# Patient Record
Sex: Female | Born: 1985 | Race: White | Hispanic: No | Marital: Married | Smoking: Former smoker
Health system: Southern US, Community
[De-identification: ages and names within clinical notes are randomized; demographics above are authoritative.]

## PROBLEM LIST (undated history)

## (undated) ENCOUNTER — Inpatient Hospital Stay (HOSPITAL_COMMUNITY): Payer: Self-pay

## (undated) DIAGNOSIS — R519 Headache, unspecified: Secondary | ICD-10-CM

## (undated) DIAGNOSIS — N9489 Other specified conditions associated with female genital organs and menstrual cycle: Secondary | ICD-10-CM

## (undated) DIAGNOSIS — R51 Headache: Secondary | ICD-10-CM

## (undated) DIAGNOSIS — C801 Malignant (primary) neoplasm, unspecified: Secondary | ICD-10-CM

## (undated) DIAGNOSIS — Z860100 Personal history of colon polyps, unspecified: Secondary | ICD-10-CM

## (undated) DIAGNOSIS — F419 Anxiety disorder, unspecified: Secondary | ICD-10-CM

## (undated) DIAGNOSIS — K219 Gastro-esophageal reflux disease without esophagitis: Secondary | ICD-10-CM

## (undated) DIAGNOSIS — I5022 Chronic systolic (congestive) heart failure: Secondary | ICD-10-CM

## (undated) DIAGNOSIS — R000 Tachycardia, unspecified: Secondary | ICD-10-CM

## (undated) DIAGNOSIS — I10 Essential (primary) hypertension: Secondary | ICD-10-CM

## (undated) DIAGNOSIS — R112 Nausea with vomiting, unspecified: Secondary | ICD-10-CM

## (undated) DIAGNOSIS — Z8742 Personal history of other diseases of the female genital tract: Secondary | ICD-10-CM

## (undated) DIAGNOSIS — J45909 Unspecified asthma, uncomplicated: Secondary | ICD-10-CM

## (undated) DIAGNOSIS — R102 Pelvic and perineal pain: Secondary | ICD-10-CM

## (undated) DIAGNOSIS — J40 Bronchitis, not specified as acute or chronic: Secondary | ICD-10-CM

## (undated) DIAGNOSIS — O903 Peripartum cardiomyopathy: Secondary | ICD-10-CM

## (undated) DIAGNOSIS — Z1401 Asymptomatic hemophilia A carrier: Secondary | ICD-10-CM

## (undated) DIAGNOSIS — Z9889 Other specified postprocedural states: Secondary | ICD-10-CM

## (undated) HISTORY — DX: Essential (primary) hypertension: I10

## (undated) HISTORY — DX: Peripartum cardiomyopathy: O90.3

## (undated) HISTORY — DX: Tachycardia, unspecified: R00.0

## (undated) HISTORY — DX: Bronchitis, not specified as acute or chronic: J40

## (undated) HISTORY — PX: ABDOMINAL HYSTERECTOMY: SHX81

## (undated) HISTORY — DX: Chronic systolic (congestive) heart failure: I50.22

## (undated) HISTORY — DX: Gastro-esophageal reflux disease without esophagitis: K21.9

---
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
## 2005-02-08 ENCOUNTER — Other Ambulatory Visit: Admission: RE | Admit: 2005-02-08 | Discharge: 2005-02-08 | Payer: Self-pay | Admitting: Obstetrics and Gynecology

## 2005-04-25 ENCOUNTER — Ambulatory Visit: Payer: Self-pay | Admitting: Gastroenterology

## 2005-05-10 ENCOUNTER — Ambulatory Visit: Payer: Self-pay | Admitting: Gastroenterology

## 2005-05-15 ENCOUNTER — Encounter (INDEPENDENT_AMBULATORY_CARE_PROVIDER_SITE_OTHER): Payer: Self-pay | Admitting: Specialist

## 2005-05-15 ENCOUNTER — Ambulatory Visit: Payer: Self-pay | Admitting: Gastroenterology

## 2005-06-04 ENCOUNTER — Ambulatory Visit: Payer: Self-pay | Admitting: Gastroenterology

## 2005-06-11 ENCOUNTER — Ambulatory Visit (HOSPITAL_COMMUNITY): Admission: RE | Admit: 2005-06-11 | Discharge: 2005-06-11 | Payer: Self-pay | Admitting: Gastroenterology

## 2005-07-02 ENCOUNTER — Ambulatory Visit: Payer: Self-pay | Admitting: Gastroenterology

## 2005-08-08 ENCOUNTER — Other Ambulatory Visit: Admission: RE | Admit: 2005-08-08 | Discharge: 2005-08-08 | Payer: Self-pay | Admitting: Obstetrics and Gynecology

## 2005-11-21 ENCOUNTER — Ambulatory Visit: Payer: Self-pay | Admitting: Gastroenterology

## 2005-11-28 ENCOUNTER — Ambulatory Visit (HOSPITAL_COMMUNITY): Admission: RE | Admit: 2005-11-28 | Discharge: 2005-11-28 | Payer: Self-pay | Admitting: Gastroenterology

## 2005-12-25 ENCOUNTER — Ambulatory Visit: Payer: Self-pay | Admitting: Gastroenterology

## 2005-12-26 ENCOUNTER — Ambulatory Visit: Payer: Self-pay | Admitting: Gastroenterology

## 2006-11-04 ENCOUNTER — Ambulatory Visit: Payer: Self-pay | Admitting: Gastroenterology

## 2006-11-14 ENCOUNTER — Encounter (INDEPENDENT_AMBULATORY_CARE_PROVIDER_SITE_OTHER): Payer: Self-pay | Admitting: Specialist

## 2006-11-14 ENCOUNTER — Ambulatory Visit: Payer: Self-pay | Admitting: Gastroenterology

## 2007-07-18 ENCOUNTER — Inpatient Hospital Stay (HOSPITAL_COMMUNITY): Admission: AD | Admit: 2007-07-18 | Discharge: 2007-07-18 | Payer: Self-pay | Admitting: Obstetrics and Gynecology

## 2007-12-30 ENCOUNTER — Inpatient Hospital Stay (HOSPITAL_COMMUNITY): Admission: AD | Admit: 2007-12-30 | Discharge: 2007-12-30 | Payer: Self-pay | Admitting: Obstetrics and Gynecology

## 2008-02-05 ENCOUNTER — Inpatient Hospital Stay (HOSPITAL_COMMUNITY): Admission: AD | Admit: 2008-02-05 | Discharge: 2008-02-05 | Payer: Self-pay | Admitting: Obstetrics and Gynecology

## 2008-02-20 ENCOUNTER — Inpatient Hospital Stay (HOSPITAL_COMMUNITY): Admission: AD | Admit: 2008-02-20 | Discharge: 2008-02-20 | Payer: Self-pay | Admitting: Obstetrics and Gynecology

## 2008-02-24 ENCOUNTER — Inpatient Hospital Stay (HOSPITAL_COMMUNITY): Admission: AD | Admit: 2008-02-24 | Discharge: 2008-02-24 | Payer: Self-pay | Admitting: Obstetrics and Gynecology

## 2008-02-29 ENCOUNTER — Inpatient Hospital Stay (HOSPITAL_COMMUNITY): Admission: AD | Admit: 2008-02-29 | Discharge: 2008-03-03 | Payer: Self-pay | Admitting: Obstetrics and Gynecology

## 2010-11-27 NOTE — Op Note (Signed)
Michelle Kane, Michelle Kane               ACCOUNT NO.:  [id number]   MEDICAL RECORD NO.:  [id number]          PATIENT TYPE:  INP   LOCATION:  9111                          FACILITY:  [facility]   PHYSICIAN:  Michelle L. Grewal, M.D.DATE OF BIRTH:  08/06/1985   DATE OF PROCEDURE:  03/01/2008  DATE OF DISCHARGE:                               OPERATIVE REPORT   PREOP DIAGNOSES:  Intrauterine pregnancy at term and failure to  progress.   POSTOP DIAGNOSES:  Intrauterine pregnancy at term and failure to  progress.   PROCEDURE:  Primary low-transverse cesarean section.   SURGEON:  Michelle L. Grewal, MD   ANESTHESIA:  Epidural.   FINDINGS:  Female infant in OP position, Apgars 8 at 1 minute and 9 at 5  minutes.   ESTIMATED BLOOD LOSS:  500 mL.   COMPLICATIONS:  None.   PROCEDURE:  The patient was taken to the operating room.  She was given  epidural without difficulty, she was prepped and draped, and low-  transverse incision was made and carried down to the fascia.  Fascia was  scored in the midline.  Rectus muscles separated and  the midline  entered bluntly.  The peritoneal suture was then stretched.  The bladder  blade was inserted.  The lower uterine segment was identified and the  bladder flap was created sharply and then digitally.  The bladder blade  was then readjusted.  A low-transverse incision was made with hemostat.  The baby was delivered with assistance of a vacuum extractor without  difficulty.  The baby was a female infant, Apgars 8 at 1 minute and 9 at  5 minutes.  The cord was clamped and cut.  The baby was handed to  waiting pediatricians.  Placenta was delivered manually.  After manual  removal, noted be intact.  The uterus was exteriorized and cleared of  all clots and debris.  The uterine incision was closed in 2 layers using  0 chromic in running locked stitch.  Hemostasis was excellent.  The  peritoneum was closed using 0 Vicryl in running locked stitch.  After  irrigation of the subcutaneous layer, the fascia was closed with 0  Vicryl in a running stitch.  The skin was closed with staples.  All  sponge, lap, and instrument counts were correct x2.  The patient went to  recovery room in stable condition.      Michelle L. Vincente Poli, M.D.  Electronically Signed     MLG/MEDQ  D:  03/01/2008  T:  03/02/2008  Job:  [id number]

## 2010-11-30 NOTE — Discharge Summary (Signed)
Michelle Kane, Michelle Kane               ACCOUNT NO.:  [id number]   MEDICAL RECORD NO.:  [id number]          PATIENT TYPE:  INP   LOCATION:  9111                          FACILITY:  [facility]   PHYSICIAN:  Guy Sandifer. Henderson Cloud, M.D. DATE OF BIRTH:  10-17-1985   DATE OF ADMISSION:  02/29/2008  DATE OF DISCHARGE:  03/03/2008                               DISCHARGE SUMMARY   ADMITTING DIAGNOSES:  1. Intrauterine pregnancy at term.  2. Induction of labor, secondary to pregnancy-induced hypertension.   DISCHARGE DIAGNOSES:  1. Status post low transverse cesarean section, secondary to failure      to progress.  2. A viable female infant.   PROCEDURE:  Primary low transverse cesarean section.   REASON FOR ADMISSION:  Please see written H&P.   HOSPITAL COURSE:  The patient is a 25 year old gravida 2, para 1-0-0-1  who presented to [name] for an induction of labor  secondary to [facility].  The patient had received Cytotec, and now Pitocin.  The patient's cervix was examined and found to be 2 cm dilated, 80%  effaced, and vertex at a -2 station.  Artificial rupture of membranes  was performed, which revealed scant amount of fluid.  The patient was  given an epidural for her comfort and Pitocin was started for  augmentation of her labor.  Later that evening, the patient had been in  good pattern of labor all day, the cervix was examined and found to be 4  cm, baby high with molding.  There had been no further change in her  cervix since approximately at 11:30 that morning and intrauterine  pressure catheter had revealed that the patient was having adequate  labor.  Decision was made to proceed with a primary low transverse  cesarean section for failure to progress and the patient was then  transferred to the operating room where epidural was dosed to an  adequate surgical level.  A low transverse incision was made with  delivery of a viable female infant with Apgars of 8 at one minute and  9  at five minutes.  The patient tolerated the procedure well and was taken  to the recovery room in stable condition.   On postoperative day #1, the patient was without complaint.  She denied  headache or blurred vision, or right upper quadrant pain.  Vital signs  were stable.  She was afebrile.  Blood pressure 90/51 to 114/71.  Abdomen soft.  Fundus firm and nontender.  Abdominal dressing was noted  to be clean, dry, and intact.  Foley was draining with adequate amount  of urine output.   Laboratory findings revealed hemoglobin of 8.1, platelet count of  174,000, and WBC count of 10.5.  Hemoglobin on admission was 10.6.  CBC  was ordered for the following morning.   On postoperative day #2, the patient desired early discharge.  Vital  signs were stable.  She was afebrile.  Heart rate was 90-94.  Abdomen  soft.  Fundus firm and nontender.  Incision was clean, dry, and intact.   Laboratory findings revealed hemoglobin of 7.1 and platelet  count of  174,000.   DISCHARGE INSTRUCTIONS:  Reviewed and the patient was later discharged  home.   CONDITION ON DISCHARGE:  Stable.   DIET:  Regular as tolerated.   ACTIVITY:  No heavy lifting, no driving x2 weeks, and no vaginal entry.   FOLLOWUP:  The patient to follow up in the office in 2-3 days for staple  removal.  She is to call for temperature greater than 100 degrees,  persistent nausea, vomiting, heavy vaginal bleeding and/or redness or  drainage from the incisional site.   DISCHARGE MEDICATIONS:  1. Tylox #30 one p.o. every 4-6 hours p.r.n.  2. Motrin 600 mg every 6 hours.  3. Prenatal vitamins 1 p.o. daily.  4. Iron supplement 325 mg one p.o. b.i.d.      Julio Sicks, N.P.      Guy Sandifer. Henderson Cloud, M.D.  Electronically Signed    CC/MEDQ  D:  03/15/2008  T:  03/16/2008  Job:  [id number]

## 2011-04-03 LAB — URINALYSIS, ROUTINE W REFLEX MICROSCOPIC
Glucose, UA: NEGATIVE
Ketones, ur: NEGATIVE
Leukocytes, UA: NEGATIVE
Nitrite: NEGATIVE
Protein, ur: NEGATIVE
Specific Gravity, Urine: >1.03 — ABNORMAL HIGH
Urobilinogen, UA: 0.2
pH: 5.5

## 2011-04-03 LAB — URINE MICROSCOPIC-ADD ON

## 2011-04-03 LAB — HCG, QUANTITATIVE, PREGNANCY: hCG, Beta Chain, Quant, S: 25807 — ABNORMAL HIGH

## 2011-04-03 LAB — RHOGAM INJECTION

## 2011-04-03 LAB — TYPE AND SCREEN
ABO/RH(D): A NEG
Antibody Screen: NEGATIVE

## 2011-04-03 LAB — POCT PREGNANCY, URINE
Operator id: 208801
Preg Test, Ur: POSITIVE

## 2011-04-11 LAB — FETAL FIBRONECTIN: Fetal Fibronectin: NEGATIVE

## 2011-06-25 ENCOUNTER — Ambulatory Visit: Payer: Self-pay

## 2011-07-27 ENCOUNTER — Emergency Department (INDEPENDENT_AMBULATORY_CARE_PROVIDER_SITE_OTHER): Payer: Self-pay

## 2011-07-27 ENCOUNTER — Other Ambulatory Visit: Payer: Self-pay

## 2011-07-27 ENCOUNTER — Encounter (HOSPITAL_BASED_OUTPATIENT_CLINIC_OR_DEPARTMENT_OTHER): Payer: Self-pay | Admitting: Emergency Medicine

## 2011-07-27 ENCOUNTER — Emergency Department (HOSPITAL_BASED_OUTPATIENT_CLINIC_OR_DEPARTMENT_OTHER)
Admission: EM | Admit: 2011-07-27 | Discharge: 2011-07-27 | Disposition: A | Discharge status: Home / Self Care | Payer: Self-pay | Attending: Emergency Medicine | Admitting: Emergency Medicine

## 2011-07-27 DIAGNOSIS — R079 Chest pain, unspecified: Secondary | ICD-10-CM | POA: Insufficient documentation

## 2011-07-27 DIAGNOSIS — A499 Bacterial infection, unspecified: Secondary | ICD-10-CM | POA: Insufficient documentation

## 2011-07-27 DIAGNOSIS — N760 Acute vaginitis: Secondary | ICD-10-CM | POA: Insufficient documentation

## 2011-07-27 DIAGNOSIS — M940 Chondrocostal junction syndrome [Tietze]: Secondary | ICD-10-CM | POA: Insufficient documentation

## 2011-07-27 DIAGNOSIS — N72 Inflammatory disease of cervix uteri: Secondary | ICD-10-CM | POA: Insufficient documentation

## 2011-07-27 DIAGNOSIS — B9689 Other specified bacterial agents as the cause of diseases classified elsewhere: Secondary | ICD-10-CM | POA: Insufficient documentation

## 2011-07-27 DIAGNOSIS — F17200 Nicotine dependence, unspecified, uncomplicated: Secondary | ICD-10-CM | POA: Insufficient documentation

## 2011-07-27 LAB — URINALYSIS, ROUTINE W REFLEX MICROSCOPIC
Glucose, UA: NEGATIVE mg/dL
Ketones, ur: NEGATIVE mg/dL
Leukocytes, UA: NEGATIVE
Nitrite: NEGATIVE
Protein, ur: NEGATIVE mg/dL
Specific Gravity, Urine: 1.019 (ref 1.005–1.030)

## 2011-07-27 LAB — WET PREP, GENITAL: Yeast Wet Prep HPF POC: NONE SEEN

## 2011-07-27 MED ORDER — IBUPROFEN 800 MG PO TABS
800.0000 mg | ORAL_TABLET | Freq: Once | ORAL | Status: AC
  Administered 2011-07-27: 800 mg via ORAL
  Filled 2011-07-27: qty 1

## 2011-07-27 MED ORDER — CEFTRIAXONE SODIUM 250 MG IJ SOLR
250.0000 mg | Freq: Once | INTRAMUSCULAR | Status: AC
  Administered 2011-07-27: 250 mg via INTRAMUSCULAR
  Filled 2011-07-27: qty 250

## 2011-07-27 MED ORDER — AZITHROMYCIN 250 MG PO TABS
1000.0000 mg | ORAL_TABLET | Freq: Once | ORAL | Status: AC
  Administered 2011-07-27: 1000 mg via ORAL
  Filled 2011-07-27: qty 4

## 2011-07-27 MED ORDER — METRONIDAZOLE 500 MG PO TABS: 500.0000 mg | ORAL_TABLET | Freq: Two times a day (BID) | ORAL | Status: AC

## 2011-07-27 MED ORDER — IBUPROFEN 600 MG PO TABS: 600.0000 mg | ORAL_TABLET | Freq: Four times a day (QID) | ORAL | Status: AC | PRN

## 2011-07-27 NOTE — ED Notes (Signed)
Pt reports intermittent mid CP w/ radiation to back & a "weird sensation" in RUE since 7 am; also c/o HA and RUQ & RLQ pain

## 2011-07-27 NOTE — ED Provider Notes (Signed)
History     CSN: [id number]  Arrival date & time 07/27/11  1102   First MD Initiated Contact with Patient 07/27/11 1155      Chief Complaint  Patient presents with  . Chest Pain    (Consider location/radiation/quality/duration/timing/severity/associated sxs/prior treatment) HPI Patient presents with a complaint of midsternal chest pain which has been intermittent this morning. It is worse with palpation. It is not associated with exertion. There is no nausea shortness of breath radiation or diaphoresis with the pain. She also has a complaint of lower abdominal pain which is cramping in nature and has been present for 2 days. She denies any dysuria or vaginal discharge. She does have a Mirena in place and has had an infection in the past with Mirena. She denies any fever or chills and has no nausea or vomiting. There no other alleviating or modifying factors. There no other associated systemic symptoms.  History reviewed. No pertinent past medical history.  Past Surgical History  Procedure Date  . Cesarean section     No family history on file.  History  Substance Use Topics  . Smoking status: Current Everyday Smoker  . Smokeless tobacco: Not on file  . Alcohol Use: Yes     social    OB History    Grav Para Term Preterm Abortions TAB SAB Ect Mult Living                  Review of Systems ROS reviewed and otherwise negative except for mentioned in HPI  Allergies  Review of patient's allergies indicates no known allergies.  Home Medications   Current Outpatient Rx  Name Route Sig Dispense Refill  . IBUPROFEN 600 MG PO TABS Oral Take 1 tablet (600 mg total) by mouth every 6 (six) hours as needed for pain. 30 tablet 0  . METRONIDAZOLE 500 MG PO TABS Oral Take 1 tablet (500 mg total) by mouth 2 (two) times daily. 14 tablet 0    BP 123/82  Pulse 92  Temp(Src) 97.8 F (36.6 C) (Oral)  Resp 16  Ht 5\' 7"  (1.702 m)  Wt 235 lb (106.595 kg)  BMI 36.81 kg/m2  SpO2  100%  LMP 07/06/2011 Vitals reviewed Physical Exam Physical Examination: General appearance - alert, well appearing, and in no distress Mental status - alert, oriented to person, place, and time Mouth - mucous membranes moist, pharynx normal without lesions Chest - clear to auscultation, no wheezes, rales or rhonchi, symmetric air entry, tenderness to palpation over chest wall at costochondral junction on right Heart - normal rate, regular rhythm, normal S1, S2, no murmurs, rubs, clicks or gallops Abdomen - soft, mild lower abdominal tenderness, no gaurding or rebound, nondistended, no masses or organomegaly Pelvic - normal external genitalia, vulva, vagina, cervix, uterus and adnexa Musculoskeletal - no joint tenderness, deformity or swelling Extremities - peripheral pulses normal, no pedal edema, no clubbing or cyanosis Skin - normal coloration and turgor, no rashes, no suspicious skin lesions noted  ED Course  Procedures (including critical care time)  12:19 PM have been to patient's room x 2 to evaluate her and she has not been in room   Date: 07/27/2011  Rate: 81  Rhythm: normal sinus rhythm  QRS Axis: normal  Intervals: normal  ST/T Wave abnormalities: normal  Conduction Disutrbances:none  Narrative Interpretation:   Old EKG Reviewed: none available    Labs Reviewed  WET PREP, GENITAL - Abnormal; Notable for the following:    Clue Cells, Wet  Prep TOO NUMEROUS TO COUNT (*)    WBC, Wet Prep HPF POC TOO NUMEROUS TO COUNT (*)    All other components within normal limits  URINALYSIS, ROUTINE W REFLEX MICROSCOPIC  PREGNANCY, URINE  POCT PREGNANCY, URINE  GC/CHLAMYDIA PROBE AMP, GENITAL   Dg Chest 2 View  07/27/2011  *RADIOLOGY REPORT*  Clinical Data: Chest pain for 1 day.  No prior surgery.  CHEST - 2 VIEW  Comparison: None.  Findings: Cardiomediastinal silhouette is within normal limits. The lungs are free of focal consolidations and pleural effusions. No edema. Visualized  osseous structures have a normal appearance.  IMPRESSION: Negative exam.  Original Report Authenticated By: Patterson Hammersmith, M.D.     1. Costochondritis   2. Bacterial vaginosis   3. Cervicitis       MDM  Patient presenting with sharp chest pains which are reproducible and most consistent with costochondritis. She has a normal EKG and normal chest x-ray as well. She has no risk factors for ACS and is PERC 0 making PE very unlikely. She also has lower abdominal pain and on pelvic exam had no tenderness of her fundus or cervical motion tenderness or adnexa. She did have clue cells and white blood cells on her wet prep so will be treated for bacterial vaginosis and cervicitis. Based on her pelvic exam I will not treat her for pelvic inflammatory disease. She was given ibuprofen for her costochondritis. She was given strict return precautions, and is agreeable with the plan for discharge.        Ethelda Chick, MD 07/27/11 1430

## 2011-07-30 LAB — GC/CHLAMYDIA PROBE AMP, GENITAL: GC Probe Amp, Genital: NEGATIVE

## 2011-07-31 NOTE — ED Notes (Signed)
+   Chlamydia. Treated with rocephin and zithromax. Per protocol MD. [employer] form faxed.

## 2011-08-01 ENCOUNTER — Telehealth (HOSPITAL_COMMUNITY): Payer: Self-pay | Admitting: *Deleted

## 2011-08-02 ENCOUNTER — Telehealth (HOSPITAL_COMMUNITY): Payer: Self-pay | Admitting: *Deleted

## 2011-08-03 ENCOUNTER — Telehealth (HOSPITAL_COMMUNITY): Payer: Self-pay | Admitting: Emergency Medicine

## 2011-08-05 NOTE — ED Notes (Signed)
Letter sent to Epic address 1/21 °

## 2011-10-29 ENCOUNTER — Emergency Department (INDEPENDENT_AMBULATORY_CARE_PROVIDER_SITE_OTHER): Payer: Managed Care, Other (non HMO)

## 2011-10-29 ENCOUNTER — Emergency Department (HOSPITAL_BASED_OUTPATIENT_CLINIC_OR_DEPARTMENT_OTHER)
Admission: EM | Admit: 2011-10-29 | Discharge: 2011-10-29 | Disposition: A | Discharge status: Home / Self Care | Payer: Managed Care, Other (non HMO) | Attending: Emergency Medicine | Admitting: Emergency Medicine

## 2011-10-29 ENCOUNTER — Encounter (HOSPITAL_BASED_OUTPATIENT_CLINIC_OR_DEPARTMENT_OTHER): Payer: Self-pay | Admitting: *Deleted

## 2011-10-29 ENCOUNTER — Other Ambulatory Visit: Payer: Self-pay | Admitting: Obstetrics and Gynecology

## 2011-10-29 DIAGNOSIS — R112 Nausea with vomiting, unspecified: Secondary | ICD-10-CM | POA: Insufficient documentation

## 2011-10-29 DIAGNOSIS — N949 Unspecified condition associated with female genital organs and menstrual cycle: Secondary | ICD-10-CM | POA: Insufficient documentation

## 2011-10-29 DIAGNOSIS — R300 Dysuria: Secondary | ICD-10-CM | POA: Insufficient documentation

## 2011-10-29 DIAGNOSIS — R102 Pelvic and perineal pain: Secondary | ICD-10-CM

## 2011-10-29 DIAGNOSIS — R10819 Abdominal tenderness, unspecified site: Secondary | ICD-10-CM | POA: Insufficient documentation

## 2011-10-29 LAB — PREGNANCY, URINE: Preg Test, Ur: NEGATIVE

## 2011-10-29 LAB — URINALYSIS, ROUTINE W REFLEX MICROSCOPIC
Glucose, UA: NEGATIVE mg/dL
Hgb urine dipstick: NEGATIVE
Ketones, ur: NEGATIVE mg/dL
Protein, ur: NEGATIVE mg/dL

## 2011-10-29 LAB — CBC
HCT: 40.4 % (ref 36.0–46.0)
Hemoglobin: 14.2 g/dL (ref 12.0–15.0)
MCH: 32.3 pg (ref 26.0–34.0)
MCHC: 35.1 g/dL (ref 30.0–36.0)
RBC: 4.4 MIL/uL (ref 3.87–5.11)

## 2011-10-29 LAB — DIFFERENTIAL
Basophils Relative: 1 % (ref 0–1)
Lymphs Abs: 2.6 10*3/uL (ref 0.7–4.0)
Monocytes Absolute: 0.6 10*3/uL (ref 0.1–1.0)
Monocytes Relative: 7 % (ref 3–12)
Neutro Abs: 5.2 10*3/uL (ref 1.7–7.7)

## 2011-10-29 LAB — COMPREHENSIVE METABOLIC PANEL
Albumin: 3.8 g/dL (ref 3.5–5.2)
Alkaline Phosphatase: 42 U/L (ref 39–117)
BUN: 13 mg/dL (ref 6–23)
Chloride: 103 mEq/L (ref 96–112)
Creatinine, Ser: 1 mg/dL (ref 0.50–1.10)
GFR calc Af Amer: 90 mL/min — ABNORMAL LOW (ref >90)
GFR calc non Af Amer: 78 mL/min — ABNORMAL LOW (ref >90)
Glucose, Bld: 97 mg/dL (ref 70–99)
Total Bilirubin: 0.3 mg/dL (ref 0.3–1.2)

## 2011-10-29 MED ORDER — ONDANSETRON HCL 4 MG/2ML IJ SOLN
4.0000 mg | Freq: Once | INTRAMUSCULAR | Status: AC
  Administered 2011-10-29: 4 mg via INTRAVENOUS
  Filled 2011-10-29: qty 2

## 2011-10-29 MED ORDER — OXYCODONE-ACETAMINOPHEN 5-325 MG PO TABS: 1.0000 | ORAL_TABLET | Freq: Four times a day (QID) | ORAL | Status: AC | PRN

## 2011-10-29 MED ORDER — KETOROLAC TROMETHAMINE 30 MG/ML IJ SOLN
30.0000 mg | Freq: Once | INTRAMUSCULAR | Status: AC
  Administered 2011-10-29: 30 mg via INTRAVENOUS
  Filled 2011-10-29: qty 1

## 2011-10-29 MED ORDER — MORPHINE SULFATE 4 MG/ML IJ SOLN
4.0000 mg | Freq: Once | INTRAMUSCULAR | Status: AC
  Administered 2011-10-29: 4 mg via INTRAVENOUS
  Filled 2011-10-29: qty 1

## 2011-10-29 MED ORDER — SODIUM CHLORIDE 0.9 % IV SOLN
Freq: Once | INTRAVENOUS | Status: AC
  Administered 2011-10-29: 22:00:00 via INTRAVENOUS

## 2011-10-29 NOTE — Discharge Instructions (Signed)
Abdominal Pain, Women Abdominal (stomach, pelvic, or belly) pain can be caused by many things. It is important to tell your doctor:  The location of the pain.   Does it come and go or is it present all the time?   Are there things that start the pain (eating certain foods, exercise)?   Are there other symptoms associated with the pain (fever, nausea, vomiting, diarrhea)?  All of this is helpful to know when trying to find the cause of the pain. CAUSES   Stomach: virus or bacteria infection, or ulcer.   Intestine: appendicitis (inflamed appendix), regional ileitis (Crohn's disease), ulcerative colitis (inflamed colon), irritable bowel syndrome, diverticulitis (inflamed diverticulum of the colon), or cancer of the stomach or intestine.   Gallbladder disease or stones in the gallbladder.   Kidney disease, kidney stones, or infection.   Pancreas infection or cancer.   Fibromyalgia (pain disorder).   Diseases of the female organs:   Uterus: fibroid (non-cancerous) tumors or infection.   Fallopian tubes: infection or tubal pregnancy.   Ovary: cysts or tumors.   Pelvic adhesions (scar tissue).   Endometriosis (uterus lining tissue growing in the pelvis and on the pelvic organs).   Pelvic congestion syndrome (female organs filling up with blood just before the menstrual period).   Pain with the menstrual period.   Pain with ovulation (producing an egg).   Pain with an IUD (intrauterine device, birth control) in the uterus.   Cancer of the female organs.   Functional pain (pain not caused by a disease, may improve without treatment).   Psychological pain.   Depression.  DIAGNOSIS  Your doctor will decide the seriousness of your pain by doing an examination.  Blood tests.   X-rays.   Ultrasound.   CT scan (computed tomography, special type of X-ray).   MRI (magnetic resonance imaging).   Cultures, for infection.   Barium enema (dye inserted in the large  intestine, to better view it with X-rays).   Colonoscopy (looking in intestine with a lighted tube).   Laparoscopy (minor surgery, looking in abdomen with a lighted tube).   Major abdominal exploratory surgery (looking in abdomen with a large incision).  TREATMENT  The treatment will depend on the cause of the pain.   Many cases can be observed and treated at home.   Over-the-counter medicines recommended by your caregiver.   Prescription medicine.   Antibiotics, for infection.   Birth control pills, for painful periods or for ovulation pain.   Hormone treatment, for endometriosis.   Nerve blocking injections.   Physical therapy.   Antidepressants.   Counseling with a psychologist or psychiatrist.   Minor or major surgery.  HOME CARE INSTRUCTIONS   Do not take laxatives, unless directed by your caregiver.   Take over-the-counter pain medicine only if ordered by your caregiver. Do not take aspirin because it can cause an upset stomach or bleeding.   Try a clear liquid diet (broth or water) as ordered by your caregiver. Slowly move to a bland diet, as tolerated, if the pain is related to the stomach or intestine.   Have a thermometer and take your temperature several times a day, and record it.   Bed rest and sleep, if it helps the pain.   Avoid sexual intercourse, if it causes pain.   Avoid stressful situations.   Keep your follow-up appointments and tests, as your caregiver orders.   If the pain does not go away with medicine or surgery, you may   try:   Acupuncture.   Relaxation exercises (yoga, meditation).   Group therapy.   Counseling.  SEEK MEDICAL CARE IF:   You notice certain foods cause stomach pain.   Your home care treatment is not helping your pain.   You need stronger pain medicine.   You want your IUD removed.   You feel faint or lightheaded.   You develop nausea and vomiting.   You develop a rash.   You are having side effects or  an allergy to your medicine.  SEEK IMMEDIATE MEDICAL CARE IF:   Your pain does not go away or gets worse.   You have a fever.   Your pain is felt only in portions of the abdomen. The right side could possibly be appendicitis. The left lower portion of the abdomen could be colitis or diverticulitis.   You are passing blood in your stools (bright red or black tarry stools, with or without vomiting).   You have blood in your urine.   You develop chills, with or without a fever.   You pass out.  MAKE SURE YOU:   Understand these instructions.   Will watch your condition.   Will get help right away if you are not doing well or get worse.  Document Released: 04/28/2007 Document Revised: 06/20/2011 Document Reviewed: 05/18/2009 [employer] Patient Information 2012 [employer]. 

## 2011-10-29 NOTE — ED Notes (Signed)
Pt states that she has pelvic pain that radiates down into her vagina. Pt was seen by her GYN earlier for her annual exam and was given nausea rx and told she was "severly dehydrated" pt describes pain as intermittent and sharp pt states that pain began 2 hours ago and nausea and vomiting x 3 days

## 2011-10-29 NOTE — ED Provider Notes (Signed)
History     CSN: [id number]  Arrival date & time 10/29/11  2039   First MD Initiated Contact with Patient 10/29/11 2112      Chief Complaint  Patient presents with  . Abdominal Pain    (Consider location/radiation/quality/duration/timing/severity/associated sxs/prior treatment) HPI Comments: Had yearly pelvic exam this afternoon.  Shortly afterward she began with severe pain in the pelvis radiating into the groin/vagina.  It appears to be worse on the right.  Also hurts worse when urinati  Patient is a 26 y.o. female presenting with abdominal pain. The history is provided by the patient.  Abdominal Pain The primary symptoms of the illness include abdominal pain and dysuria. The primary symptoms of the illness do not include fever, nausea, vomiting, vaginal discharge or vaginal bleeding. The current episode started 1 to 2 hours ago. The onset of the illness was sudden. The problem has not changed since onset. The dysuria is not associated with frequency or urgency.  The patient states that she believes she is currently not pregnant. The patient has not had a change in bowel habit. Symptoms associated with the illness do not include chills, urgency or frequency.    History reviewed. No pertinent past medical history.  Past Surgical History  Procedure Date  . Cesarean section     History reviewed. No pertinent family history.  History  Substance Use Topics  . Smoking status: Current Everyday Smoker  . Smokeless tobacco: Not on file  . Alcohol Use: Yes     social    OB History    Grav Para Term Preterm Abortions TAB SAB Ect Mult Living                  Review of Systems  Constitutional: Negative for fever and chills.  Gastrointestinal: Positive for abdominal pain. Negative for nausea and vomiting.  Genitourinary: Positive for dysuria. Negative for urgency, frequency, vaginal bleeding and vaginal discharge.  All other systems reviewed and are negative.    Allergies    Vicodin  Home Medications   Current Outpatient Rx  Name Route Sig Dispense Refill  . ALBUTEROL SULFATE HFA 108 (90 BASE) MCG/ACT IN AERS Inhalation Inhale 2 puffs into the lungs every 6 (six) hours as needed. For wheezing    . CLONAZEPAM 1 MG PO TABS Oral Take 0.5 mg by mouth 2 (two) times daily as needed. For anxiety    . IBUPROFEN 200 MG PO TABS Oral Take 400 mg by mouth once as needed. For headache    . LEVONORGESTREL 20 MCG/24HR IU IUD Intrauterine 1 each by Intrauterine route once. Inserted April 2012    . ADULT MULTIVITAMIN W/MINERALS CH Oral Take 1 tablet by mouth daily.      BP 123/83  Pulse 104  Temp 97.6 F (36.4 C)  Resp 20  SpO2 97%  LMP 10/15/2011  Physical Exam  Nursing note and vitals reviewed. Constitutional: She is oriented to person, place, and time. She appears well-developed and well-nourished. No distress.  HENT:  Head: Normocephalic and atraumatic.  Neck: Normal range of motion. Neck supple.  Cardiovascular: Normal rate and regular rhythm.  Exam reveals no gallop and no friction rub.   No murmur heard. Pulmonary/Chest: Effort normal and breath sounds normal. No respiratory distress. She has no wheezes.  Abdominal: Soft. Bowel sounds are normal. She exhibits no distension. There is no tenderness.       TTp in the suprapubic region.  There is no rebound or guarding.  Musculoskeletal: Normal  range of motion.  Neurological: She is alert and oriented to person, place, and time.  Skin: Skin is warm and dry. She is not diaphoretic.    ED Course  Procedures (including critical care time)   Labs Reviewed  URINALYSIS, ROUTINE W REFLEX MICROSCOPIC  PREGNANCY, URINE  CBC  DIFFERENTIAL  COMPREHENSIVE METABOLIC PANEL   No results found.   No diagnosis found.    MDM  I have been unable to identify a cause of her symptoms.  I was suspicious of the mirena iud.  I performed a pelvic exam but there were no strings that I could find to remove it.  I have  recommended that she see her Gyn again tomorrow for follow up to discuss this.  She will be discharged with pain medication, follow up with Gyn.        Geoffery Lyons, MD 10/29/11 [phone]

## 2011-10-30 LAB — GC/CHLAMYDIA PROBE AMP, GENITAL
Chlamydia, DNA Probe: NEGATIVE
GC Probe Amp, Genital: NEGATIVE

## 2011-11-27 DIAGNOSIS — N920 Excessive and frequent menstruation with regular cycle: Secondary | ICD-10-CM | POA: Insufficient documentation

## 2011-11-27 DIAGNOSIS — J45901 Unspecified asthma with (acute) exacerbation: Secondary | ICD-10-CM | POA: Insufficient documentation

## 2012-05-13 ENCOUNTER — Other Ambulatory Visit: Payer: Self-pay | Admitting: Obstetrics and Gynecology

## 2012-05-13 DIAGNOSIS — N6009 Solitary cyst of unspecified breast: Secondary | ICD-10-CM

## 2012-05-19 ENCOUNTER — Other Ambulatory Visit: Payer: Managed Care, Other (non HMO)

## 2012-05-26 ENCOUNTER — Ambulatory Visit
Admission: RE | Admit: 2012-05-26 | Discharge: 2012-05-26 | Disposition: A | Discharge status: Home / Self Care | Payer: Self-pay | Source: Ambulatory Visit | Attending: Obstetrics and Gynecology | Admitting: Obstetrics and Gynecology

## 2012-05-26 DIAGNOSIS — N6009 Solitary cyst of unspecified breast: Secondary | ICD-10-CM

## 2012-09-20 ENCOUNTER — Encounter (HOSPITAL_BASED_OUTPATIENT_CLINIC_OR_DEPARTMENT_OTHER): Payer: Self-pay | Admitting: *Deleted

## 2012-09-20 ENCOUNTER — Emergency Department (HOSPITAL_BASED_OUTPATIENT_CLINIC_OR_DEPARTMENT_OTHER)
Admission: EM | Admit: 2012-09-20 | Discharge: 2012-09-20 | Disposition: A | Discharge status: Home / Self Care | Payer: Medicaid Other | Attending: Emergency Medicine | Admitting: Emergency Medicine

## 2012-09-20 DIAGNOSIS — R112 Nausea with vomiting, unspecified: Secondary | ICD-10-CM | POA: Insufficient documentation

## 2012-09-20 DIAGNOSIS — R197 Diarrhea, unspecified: Secondary | ICD-10-CM | POA: Insufficient documentation

## 2012-09-20 DIAGNOSIS — Z79899 Other long term (current) drug therapy: Secondary | ICD-10-CM | POA: Insufficient documentation

## 2012-09-20 DIAGNOSIS — K5289 Other specified noninfective gastroenteritis and colitis: Secondary | ICD-10-CM | POA: Insufficient documentation

## 2012-09-20 DIAGNOSIS — K529 Noninfective gastroenteritis and colitis, unspecified: Secondary | ICD-10-CM

## 2012-09-20 DIAGNOSIS — F17200 Nicotine dependence, unspecified, uncomplicated: Secondary | ICD-10-CM | POA: Insufficient documentation

## 2012-09-20 LAB — URINALYSIS, ROUTINE W REFLEX MICROSCOPIC
Bilirubin Urine: NEGATIVE
Glucose, UA: NEGATIVE mg/dL
Hgb urine dipstick: NEGATIVE
Specific Gravity, Urine: 1.005 (ref 1.005–1.030)
pH: 6 (ref 5.0–8.0)

## 2012-09-20 LAB — CBC WITH DIFFERENTIAL/PLATELET
Basophils Absolute: 0 10*3/uL (ref 0.0–0.1)
Basophils Relative: 0 % (ref 0–1)
Eosinophils Absolute: 0.4 10*3/uL (ref 0.0–0.7)
Eosinophils Relative: 4 % (ref 0–5)
HCT: 37.9 % (ref 36.0–46.0)
Hemoglobin: 13.2 g/dL (ref 12.0–15.0)
Lymphocytes Relative: 26 % (ref 12–46)
Lymphs Abs: 2.7 10*3/uL (ref 0.7–4.0)
MCH: 31.7 pg (ref 26.0–34.0)
MCHC: 34.8 g/dL (ref 30.0–36.0)
MCV: 90.9 fL (ref 78.0–100.0)
Monocytes Absolute: 0.6 10*3/uL (ref 0.1–1.0)
Monocytes Relative: 6 % (ref 3–12)
Neutro Abs: 6.4 10*3/uL (ref 1.7–7.7)
Neutrophils Relative %: 64 % (ref 43–77)
Platelets: 193 10*3/uL (ref 150–400)
RBC: 4.17 MIL/uL (ref 3.87–5.11)
RDW: 12.2 % (ref 11.5–15.5)
WBC: 10 10*3/uL (ref 4.0–10.5)

## 2012-09-20 LAB — COMPREHENSIVE METABOLIC PANEL
ALT: 13 U/L (ref 0–35)
AST: 13 U/L (ref 0–37)
Albumin: 3.6 g/dL (ref 3.5–5.2)
Alkaline Phosphatase: 39 U/L (ref 39–117)
BUN: 7 mg/dL (ref 6–23)
CO2: 23 mEq/L (ref 19–32)
Calcium: 9 mg/dL (ref 8.4–10.5)
Chloride: 106 mEq/L (ref 96–112)
Creatinine, Ser: 0.6 mg/dL (ref 0.50–1.10)
GFR calc Af Amer: >90 mL/min (ref >90)
GFR calc non Af Amer: >90 mL/min (ref >90)
Glucose, Bld: 94 mg/dL (ref 70–99)
Potassium: 3.6 mEq/L (ref 3.5–5.1)
Sodium: 140 mEq/L (ref 135–145)
Total Bilirubin: 0.2 mg/dL — ABNORMAL LOW (ref 0.3–1.2)
Total Protein: 6.5 g/dL (ref 6.0–8.3)

## 2012-09-20 LAB — PREGNANCY, URINE: Preg Test, Ur: NEGATIVE

## 2012-09-20 LAB — LIPASE, BLOOD: Lipase: 35 U/L (ref 11–59)

## 2012-09-20 MED ORDER — SODIUM CHLORIDE 0.9 % IV BOLUS (SEPSIS)
2000.0000 mL | Freq: Once | INTRAVENOUS | Status: AC
  Administered 2012-09-20: 1000 mL via INTRAVENOUS

## 2012-09-20 MED ORDER — ONDANSETRON HCL 4 MG/2ML IJ SOLN
4.0000 mg | Freq: Once | INTRAMUSCULAR | Status: AC
  Administered 2012-09-20: 4 mg via INTRAVENOUS
  Filled 2012-09-20: qty 2

## 2012-09-20 MED ORDER — ONDANSETRON HCL 4 MG PO TABS: 4.0000 mg | ORAL_TABLET | Freq: Four times a day (QID) | ORAL | Status: DC

## 2012-09-20 NOTE — ED Provider Notes (Signed)
History     CSN: [id number]  Arrival date & time 09/20/12  4098   First MD Initiated Contact with Patient 09/20/12 1959      Chief Complaint  Patient presents with  . Abdominal Pain    (Consider location/radiation/quality/duration/timing/severity/associated sxs/prior treatment) HPI Patient presents emergency department with nausea, vomiting, and diarrhea.  This is been ongoing for the last 36 hours.  Patient, states, that her diarrhea has stopped, but she still having nausea and vomiting.  Patient denies any chest pain, shortness of breath, fever, headache, visual changes, weakness, blood in her stool, cough, back pain, or dysuria.  Patient, states she tripped take 1 Phenergan, but was unable to keep down. History reviewed. No pertinent past medical history.  Past Surgical History  Procedure Laterality Date  . Cesarean section      History reviewed. No pertinent family history.  History  Substance Use Topics  . Smoking status: Current Every Day Smoker  . Smokeless tobacco: Not on file  . Alcohol Use: Yes     Comment: social    OB History   Grav Para Term Preterm Abortions TAB SAB Ect Mult Living                  Review of Systems All other systems negative except as documented in the HPI. All pertinent positives and negatives as reviewed in the HPI. Allergies  Vicodin  Home Medications   Current Outpatient Rx  Name  Route  Sig  Dispense  Refill  . albuterol (PROVENTIL HFA;VENTOLIN HFA) 108 (90 BASE) MCG/ACT inhaler   Inhalation   Inhale 2 puffs into the lungs every 6 (six) hours as needed. For wheezing         . clonazePAM (KLONOPIN) 1 MG tablet   Oral   Take 0.5 mg by mouth 2 (two) times daily as needed. For anxiety         . ibuprofen (ADVIL,MOTRIN) 200 MG tablet   Oral   Take 400 mg by mouth once as needed. For headache         . levonorgestrel (MIRENA) 20 MCG/24HR IUD   Intrauterine   1 each by Intrauterine route once. Inserted April 2012        . Multiple Vitamin (MULITIVITAMIN WITH MINERALS) TABS   Oral   Take 1 tablet by mouth daily.           BP 123/81  Pulse 86  Temp(Src) 97.8 F (36.6 C) (Oral)  Resp 18  Ht 5\' 7"  (1.702 m)  Wt 220 lb (99.791 kg)  BMI 34.45 kg/m2  SpO2 100%  LMP 08/16/2012  Physical Exam  Nursing note and vitals reviewed. Constitutional: She is oriented to person, place, and time. She appears well-developed and well-nourished. No distress.  HENT:  Head: Normocephalic and atraumatic.  Mouth/Throat: Oropharynx is clear and moist.  Eyes: Pupils are equal, round, and reactive to light.  Neck: Normal range of motion. Neck supple.  Cardiovascular: Normal rate, regular rhythm and normal heart sounds.  Exam reveals no gallop and no friction rub.   No murmur heard. Pulmonary/Chest: Effort normal and breath sounds normal. No respiratory distress.  Abdominal: Soft. Bowel sounds are normal. She exhibits no distension. There is no tenderness. There is no guarding.  Neurological: She is alert and oriented to person, place, and time. Coordination normal.  Skin: Skin is warm and dry. No rash noted.    ED Course  Procedures (including critical care time)  Labs Reviewed  COMPREHENSIVE  METABOLIC PANEL - Abnormal; Notable for the following:    Total Bilirubin 0.2 (*)    All other components within normal limits  URINALYSIS, ROUTINE W REFLEX MICROSCOPIC  PREGNANCY, URINE  CBC WITH DIFFERENTIAL  LIPASE, BLOOD   Patient has tolerated oral fluids.  Patient most likely has a viral GI bug.  She is advised return here for any worsening in her condition.  Advised to slowly increase her fluid intake.   MDM  MDM Reviewed: vitals and nursing note Interpretation: labs            Carlyle Dolly, PA-C 09/20/12 2256

## 2012-09-20 NOTE — ED Notes (Signed)
Pt describes generalized abd pain, N/V. No diarrhea. Denies urinary s/s or vag d/c.

## 2012-09-20 NOTE — ED Provider Notes (Signed)
History/physical exam/procedure(s) were performed by non-physician practitioner and as supervising physician I was immediately available for consultation/collaboration. I have reviewed all notes and am in agreement with care and plan.   Danielle S Ray, MD 09/20/12 [id number] 

## 2012-10-20 ENCOUNTER — Other Ambulatory Visit: Payer: Self-pay | Admitting: Obstetrics and Gynecology

## 2013-01-12 ENCOUNTER — Ambulatory Visit (INDEPENDENT_AMBULATORY_CARE_PROVIDER_SITE_OTHER): Payer: No Typology Code available for payment source | Admitting: Family Medicine

## 2013-01-12 ENCOUNTER — Encounter: Payer: Self-pay | Admitting: Family Medicine

## 2013-01-12 VITALS — BP 122/82 | HR 80 | Temp 97.8°F | Wt 229.0 lb

## 2013-01-12 DIAGNOSIS — Z136 Encounter for screening for cardiovascular disorders: Secondary | ICD-10-CM

## 2013-01-12 DIAGNOSIS — K7689 Other specified diseases of liver: Secondary | ICD-10-CM

## 2013-01-12 DIAGNOSIS — K760 Fatty (change of) liver, not elsewhere classified: Secondary | ICD-10-CM

## 2013-01-12 DIAGNOSIS — Z23 Encounter for immunization: Secondary | ICD-10-CM

## 2013-01-12 DIAGNOSIS — K219 Gastro-esophageal reflux disease without esophagitis: Secondary | ICD-10-CM | POA: Insufficient documentation

## 2013-01-12 DIAGNOSIS — Z833 Family history of diabetes mellitus: Secondary | ICD-10-CM

## 2013-01-12 DIAGNOSIS — E669 Obesity, unspecified: Secondary | ICD-10-CM | POA: Insufficient documentation

## 2013-01-12 LAB — LIPID PANEL
Cholesterol: 185 mg/dL (ref 0–200)
HDL: 33.3 mg/dL — ABNORMAL LOW (ref >39.00)
LDL Cholesterol: 131 mg/dL — ABNORMAL HIGH (ref 0–99)
Total CHOL/HDL Ratio: 6
Triglycerides: 104 mg/dL (ref 0.0–149.0)
VLDL: 20.8 mg/dL (ref 0.0–40.0)

## 2013-01-12 LAB — COMPREHENSIVE METABOLIC PANEL
ALT: 17 U/L (ref 0–35)
AST: 14 U/L (ref 0–37)
Alkaline Phosphatase: 40 U/L (ref 39–117)
BUN: 11 mg/dL (ref 6–23)
Chloride: 107 mEq/L (ref 96–112)
Creatinine, Ser: 0.6 mg/dL (ref 0.4–1.2)
Total Bilirubin: 0.6 mg/dL (ref 0.3–1.2)

## 2013-01-12 LAB — CBC WITH DIFFERENTIAL/PLATELET
Basophils Relative: 0.5 % (ref 0.0–3.0)
Eosinophils Absolute: 0.4 10*3/uL (ref 0.0–0.7)
Eosinophils Relative: 4.8 % (ref 0.0–5.0)
HCT: 42.5 % (ref 36.0–46.0)
Hemoglobin: 14.4 g/dL (ref 12.0–15.0)
MCHC: 33.9 g/dL (ref 30.0–36.0)
MCV: 93.8 fl (ref 78.0–100.0)
Monocytes Absolute: 0.4 10*3/uL (ref 0.1–1.0)
Neutro Abs: 5 10*3/uL (ref 1.4–7.7)
Neutrophils Relative %: 63.2 % (ref 43.0–77.0)
RBC: 4.53 Mil/uL (ref 3.87–5.11)
WBC: 7.9 10*3/uL (ref 4.5–10.5)

## 2013-01-12 MED ORDER — OMEPRAZOLE 40 MG PO CPDR: 40.0000 mg | DELAYED_RELEASE_CAPSULE | Freq: Every day | ORAL | Status: DC

## 2013-01-12 MED ORDER — ALBUTEROL SULFATE HFA 108 (90 BASE) MCG/ACT IN AERS: 2.0000 | INHALATION_SPRAY | Freq: Four times a day (QID) | RESPIRATORY_TRACT | Status: DC | PRN

## 2013-01-12 NOTE — Patient Instructions (Addendum)
It was good to see you. We will call you with your lab results or view them online.

## 2013-01-12 NOTE — Progress Notes (Signed)
Subjective:    Patient ID: Michelle Kane, female    DOB: 05-17-1986, 27 y.o.   MRN: [id number]  HPI  27 yo here to establish care.  Her mother sees Dr. Milinda Antis.  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  Mother has a h/o "cancerous polyps" and therefore she just had her first colonoscopy at [facility] ( we are awaiting records).  Per pt, positive for polyps and recommended 5 year recall.  She also reports having other testing done (? abdom ultrasound) and was told she has a fatty liver.  + FH of obesity, CAD, HLD and DM.  Sees Dr. Vincente Poli- per pt, abnormal pap smears.  On q 6 month intervals.  Patient Active Problem List   Diagnosis Date Noted  . Obesity, unspecified 01/12/2013  . Fatty liver 01/12/2013  . Family history of diabetes mellitus (DM) 01/12/2013  . GERD (gastroesophageal reflux disease)    Past Medical History  Diagnosis Date  . GERD (gastroesophageal reflux disease)   . Colon polyps    Past Surgical History  Procedure Laterality Date  . Cesarean section     History  Substance Use Topics  . Smoking status: Current Every Day Smoker  . Smokeless tobacco: Not on file  . Alcohol Use: Yes     Comment: social   Family History  Problem Relation Age of Onset  . Hyperlipidemia Mother   . Crohn's disease Mother   . Colon polyps Mother   . Hyperlipidemia Father   . Hemophilia Father   . Hemophilia Son    Allergies  Allergen Reactions  . Vicodin (Hydrocodone-Acetaminophen) Itching and Nausea And Vomiting   Current Outpatient Prescriptions on File Prior to Visit  Medication Sig Dispense Refill  . ibuprofen (ADVIL,MOTRIN) 200 MG tablet Take 400 mg by mouth once as needed. For headache      . Multiple Vitamin (MULITIVITAMIN WITH MINERALS) TABS Take 1 tablet by mouth daily.       No current facility-administered medications on file prior to visit.   The PMH, PSH, Social History, Family History, Medications, and allergies have been reviewed in [facility], and have  been updated if relevant.   Review of Systems    See HPI No abdominal pain No changes in her bowels. No n/v/d No recent weight changes Objective:   Physical Exam BP 122/82  Pulse 80  Temp(Src) 97.8 F (36.6 C)  Wt 229 lb (103.874 kg)  BMI 35.86 kg/m2  General: obese, Well-developed,well-nourished,in no acute distress; alert,appropriate and cooperative throughout examination Head:  normocephalic and atraumatic.   Eyes:  vision grossly intact, pupils equal, pupils round, and pupils reactive to light.   Ears:  R ear normal and L ear normal.   Nose:  no external deformity.   Mouth:  good dentition.   Neck:  No deformities, masses, or tenderness noted. Lungs:  Normal respiratory effort, chest expands symmetrically. Lungs are clear to auscultation, no crackles or wheezes. Heart:  Normal rate and regular rhythm. S1 and S2 normal without gallop, murmur, click, rub or other extra sounds. Abdomen:  Bowel sounds positive,abdomen soft and non-tender without masses, organomegaly or hernias noted Msk:  No deformity or scoliosis noted of thoracic or lumbar spine.   Extremities:  No clubbing, cyanosis, edema, or deformity noted with normal full range of motion of all joints.   Neurologic:  alert & oriented X3 and gait normal.   Skin:  Intact without suspicious lesions or rashes Cervical Nodes:  No  lymphadenopathy noted Axillary Nodes:  No palpable lymphadenopathy Psych:  Cognition and judgment appear intact. Alert and cooperative with normal attention span and concentration. No apparent delusions, illusions, hallucination      Assessment & Plan:  1. Obesity, unspecified Discussed with pt concerning fatty liver.    2. Fatty liver She is aware that it may lead to cirrhosis in future.  Discussed weight loss BP excellent. Check labs today to rule out HLD, DM. The patient indicates understanding of these issues and agrees with the plan.  - Comprehensive metabolic panel - CBC with  Differential  3. Family history of diabetes mellitus (DM)  - Hemoglobin A1c  4. Screening for ischemic heart disease  - Lipid Panel  5. Immunization due  - Tdap vaccine greater than or equal to 7yo IM

## 2013-02-04 ENCOUNTER — Ambulatory Visit: Payer: No Typology Code available for payment source | Admitting: Family Medicine

## 2013-02-04 ENCOUNTER — Ambulatory Visit (INDEPENDENT_AMBULATORY_CARE_PROVIDER_SITE_OTHER): Payer: No Typology Code available for payment source | Admitting: Family Medicine

## 2013-02-04 ENCOUNTER — Encounter: Payer: Self-pay | Admitting: Family Medicine

## 2013-02-04 VITALS — BP 120/92 | HR 97 | Temp 97.9°F | Ht 67.0 in | Wt 229.0 lb

## 2013-02-04 DIAGNOSIS — R0602 Shortness of breath: Secondary | ICD-10-CM | POA: Insufficient documentation

## 2013-02-04 DIAGNOSIS — I10 Essential (primary) hypertension: Secondary | ICD-10-CM | POA: Insufficient documentation

## 2013-02-04 DIAGNOSIS — R079 Chest pain, unspecified: Secondary | ICD-10-CM

## 2013-02-04 DIAGNOSIS — R000 Tachycardia, unspecified: Secondary | ICD-10-CM

## 2013-02-04 LAB — CBC WITH DIFFERENTIAL/PLATELET
Basophils Relative: 0.5 % (ref 0.0–3.0)
Eosinophils Absolute: 0.3 10*3/uL (ref 0.0–0.7)
Eosinophils Relative: 2.8 % (ref 0.0–5.0)
Lymphocytes Relative: 22.2 % (ref 12.0–46.0)
MCHC: 34 g/dL (ref 30.0–36.0)
Monocytes Absolute: 0.6 10*3/uL (ref 0.1–1.0)
Neutrophils Relative %: 69.3 % (ref 43.0–77.0)
Platelets: 193 10*3/uL (ref 150.0–400.0)
RBC: 4.55 Mil/uL (ref 3.87–5.11)
WBC: 10.8 10*3/uL — ABNORMAL HIGH (ref 4.5–10.5)

## 2013-02-04 LAB — BASIC METABOLIC PANEL
BUN: 8 mg/dL (ref 6–23)
Chloride: 103 mEq/L (ref 96–112)
GFR: 135.3 mL/min (ref >60.00)
Potassium: 3.9 mEq/L (ref 3.5–5.1)
Sodium: 136 mEq/L (ref 135–145)

## 2013-02-04 LAB — TSH: TSH: 1.11 u[IU]/mL (ref 0.35–5.50)

## 2013-02-04 MED ORDER — LOSARTAN POTASSIUM-HCTZ 50-12.5 MG PO TABS: 1.0000 | ORAL_TABLET | Freq: Every day | ORAL | Status: DC

## 2013-02-04 NOTE — Patient Instructions (Addendum)
Stop at lab on way out.  Start losartan HCTZ. Follow BP at home daily... Keep record. Goal <140/90.  Call if BP remaining high.  Follow up with PCP in 2 weeks for BP re-eval.  Call sooner if CP and SOB not improving.  Quit SMOKING!

## 2013-02-04 NOTE — Assessment & Plan Note (Signed)
EKG nml today... May be due to anxiety ( although pt denies) or chest wall pain given ttp.  Less likely is PE given some recent SOB... Will send for Ddimer. Pt is not hypoxic and there are no PE associated EKG changes.

## 2013-02-04 NOTE — Progress Notes (Signed)
Subjective:    Patient ID: Michelle Kane, female    DOB: 16-May-1986, 27 y.o.   MRN: [id number]  HPI 27 year old female pt of Dr. Elmer Sow with BMI of 35.8 presents with elevated blood pressure measurements. She reports that she noted BPs 160/102 yesterday. This AM 130/88. Went to work but BP increased at work. 150/108.  She has headache, pain into left arm and down back and neck. Occ nausea  She has had no previous issues with BP. No new medications, no stress, no pain. She is having current pain in anterior substernal.. Has been hurting more on right chest... intermittent , dull ache. Some shortness of breath episode at night in last week, awoke at night gasping. Rolled over to stomach and it stopped.  Has noted some increase in swelling in past few leg, L greater than right. Ongoing for years.   She has had elevated pulse at times.. Off and on at times in past.      MGF: CABG age 11  Brother pacemeaker, defib for  CHF  Mother in kidney failure for kidney issue.  She is a smoker. Last cholesterol LDL 121. A1c 5.1      Review of Systems  Constitutional: Negative for fever and fatigue.  HENT: Negative for ear pain.   Eyes: Negative for pain.  Respiratory: Positive for shortness of breath. Negative for chest tightness.   Cardiovascular: Positive for chest pain and leg swelling. Negative for palpitations.  Gastrointestinal: Negative for abdominal pain.  Genitourinary: Negative for dysuria.       Objective:   Physical Exam  Constitutional: Vital signs are normal. She appears well-developed and well-nourished. She is cooperative.  Non-toxic appearance. She does not appear ill. No distress.  Obese appearing, leg jumping up and down but pt state she is not anxious ( she appears to be)  HENT:  Head: Normocephalic.  Right Ear: Hearing, tympanic membrane, external ear and ear canal normal. Tympanic membrane is not erythematous, not retracted and not bulging.  Left Ear: Hearing,  tympanic membrane, external ear and ear canal normal. Tympanic membrane is not erythematous, not retracted and not bulging.  Nose: No mucosal edema or rhinorrhea. Right sinus exhibits no maxillary sinus tenderness and no frontal sinus tenderness. Left sinus exhibits no maxillary sinus tenderness and no frontal sinus tenderness.  Mouth/Throat: Uvula is midline, oropharynx is clear and moist and mucous membranes are normal.  Eyes: Conjunctivae, EOM and lids are normal. Pupils are equal, round, and reactive to light. No foreign bodies found.  Neck: Trachea normal and normal range of motion. Neck supple. Carotid bruit is not present. No mass and no thyromegaly present.  Cardiovascular: Normal rate, regular rhythm, S1 normal, S2 normal, normal heart sounds, intact distal pulses and normal pulses.  Exam reveals no gallop and no friction rub.   No murmur heard. Pulmonary/Chest: Effort normal and breath sounds normal. Not tachypneic. No respiratory distress. She has no decreased breath sounds. She has no wheezes. She has no rhonchi. She has no rales. Chest wall is not dull to percussion. She exhibits tenderness and bony tenderness. She exhibits no mass and no laceration.    Abdominal: Soft. Normal appearance and bowel sounds are normal. There is no tenderness.  Neurological: She is alert.  Skin: Skin is warm, dry and intact. No rash noted.  Psychiatric: Her speech is normal and behavior is normal. Judgment and thought content normal. Her mood appears anxious. Cognition and memory are normal. She does not exhibit a  depressed mood.          Assessment & Plan:

## 2013-02-04 NOTE — Assessment & Plan Note (Signed)
Will eval with labs to determine if secondary cause and why sudden new elevation.  Pt denies stress etc.  She has had recent risk factor eval...  nml chol, no DM. Recommended weight loss, healthy eating and exercsie.  Stop smoking.  Start losartan hctz low dose.  Follow BP at home.

## 2013-02-05 ENCOUNTER — Encounter: Payer: Self-pay | Admitting: Family Medicine

## 2013-02-05 ENCOUNTER — Telehealth: Payer: Self-pay | Admitting: *Deleted

## 2013-02-05 NOTE — Telephone Encounter (Signed)
Patient has multiple questions can you please call her!

## 2013-02-05 NOTE — Telephone Encounter (Signed)
Answered pt questions  in detail. BP after taking med 99 /60 Prior to taking losartan BP 160/90.  Pt was instructed to take 1/2 tab losartan HCTZ instead of full. Cotintue to follow BP.  She remains concerned about why BP elevated now without strong family history... Discussed multifactorial... Sedentary lifestyle, obesity, smoking, diet. Can always consider eval for renal artery stenosis at follow up with PCP.

## 2013-02-26 ENCOUNTER — Ambulatory Visit (INDEPENDENT_AMBULATORY_CARE_PROVIDER_SITE_OTHER): Payer: BC Managed Care – PPO | Admitting: Family Medicine

## 2013-02-26 ENCOUNTER — Encounter: Payer: Self-pay | Admitting: Family Medicine

## 2013-02-26 VITALS — BP 108/84 | HR 85 | Temp 97.6°F | Wt 235.8 lb

## 2013-02-26 DIAGNOSIS — I10 Essential (primary) hypertension: Secondary | ICD-10-CM

## 2013-02-26 DIAGNOSIS — J3489 Other specified disorders of nose and nasal sinuses: Secondary | ICD-10-CM

## 2013-02-26 MED ORDER — AMOXICILLIN 875 MG PO TABS: 875.0000 mg | ORAL_TABLET | Freq: Two times a day (BID) | ORAL | Status: DC

## 2013-02-26 NOTE — Patient Instructions (Addendum)
Good to see you. I am glad you have stopped your blood pressure medication- please DO NOT restart it.  Please let me know if your blood pressure goes up again.   Take amoxicillin as directed- 1 tablet twice daily x 10 days.  Try over the counter nasocort-start with 2 sprays per nostril per day...and then try to taper to 1 spray per nostril once symptoms improve.   You can use warm compresses.  Cough suppressant at night.   Call if not improving as expected in 5-7 days.

## 2013-02-26 NOTE — Progress Notes (Signed)
  Subjective:    Patient ID: Michelle Kane, female    DOB: 1985-12-01, 27 y.o.   MRN: [id number]  HPI 27 year old female whom I have only seen one other time here for follow up HTN.  When I initially saw her at the beginning of July, she was normotensive.   Saw Dr. Leonard Schwartz on 7/24.  Note reviewed.  She was also normotensive at that visit.    She reported that she noted BPs as high as 160/102 yesterday.  She was also having some chest pain.  EKG and d dimer unremarkable. She was started on low dose losartan/HCTZ.  Called back and stated that she was hypotensive and was advised to cut pill in half. Stopped taking 1/2 tab because BP was dropping to 90/50s and she was symptomatic.  Today BP at work was 138/89.   She is also complaining of over 1 week of sinus pressure, congestion, cough.  No fevers or SOB.  BP Readings from Last 3 Encounters:  02/04/13 120/92  01/12/13 122/82  09/20/12 123/81      Review of Systems  See HPI    No CP or SOB Objective:   Physical Exam  BP 108/84  Pulse 85  Temp(Src) 97.6 F (36.4 C) (Oral)  Wt 235 lb 12 oz (106.935 kg)  BMI 36.91 kg/m2  SpO2 97%  LMP 02/08/2013  Constitutional: Vital signs are normal. She appears well-developed and well-nourished. She is cooperative.  Non-toxic appearance. She does not appear ill. No distress.  Obese appearing, NAD HENT:  Head: Normocephalic.  Right Ear: Hearing, tympanic membrane, external ear and ear canal normal. Tympanic membrane is not erythematous, not retracted and not bulging.  Left Ear: Hearing, tympanic membrane, external ear and ear canal normal. Tympanic membrane is not erythematous, not retracted and not bulging.  Nose: No mucosal edema or rhinorrhea. Right sinus exhibits no maxillary sinus tenderness and no frontal sinus tenderness. Left sinus exhibits no maxillary sinus tenderness and no frontal sinus tenderness.  Mouth/Throat: Uvula is midline, oropharynx is clear and moist and mucous membranes are  normal.  Eyes: Conjunctivae, EOM and lids are normal. Pupils are equal, round, and reactive to light. No foreign bodies found.  Neck: Trachea normal and normal range of motion. Neck supple. Carotid bruit is not present. No mass and no thyromegaly present.  Cardiovascular: Normal rate, regular rhythm, S1 normal, S2 normal, normal heart sounds, intact distal pulses and normal pulses.  Exam reveals no gallop and no friction rub.   Abdominal: Soft. Normal appearance and bowel sounds are normal. There is no tenderness.  Neurological: She is alert.  Skin: Skin is warm, dry and intact. No rash noted.  Psychiatric: Her speech is normal and behavior is normal. Judgment and thought content normal. Her mood appears anxious. Cognition and memory are normal. She does not exhibit a depressed mood.      Assessment & Plan:  1. HTN (hypertension) Normotensive again today.  I do not believe she has true HTN, likely work/stress related.  Advised to not restart her medication. She will let you know if BP increases again. The patient indicates understanding of these issues and agrees with the plan.  2.  Sinusitis- Given duration and progression of symptoms, will treat for bacterial sinusitis. See AVS.

## 2013-03-06 ENCOUNTER — Emergency Department (HOSPITAL_BASED_OUTPATIENT_CLINIC_OR_DEPARTMENT_OTHER)
Admission: EM | Admit: 2013-03-06 | Discharge: 2013-03-06 | Disposition: A | Discharge status: Home / Self Care | Payer: BC Managed Care – PPO | Attending: Emergency Medicine | Admitting: Emergency Medicine

## 2013-03-06 ENCOUNTER — Encounter (HOSPITAL_BASED_OUTPATIENT_CLINIC_OR_DEPARTMENT_OTHER): Payer: Self-pay

## 2013-03-06 ENCOUNTER — Emergency Department (HOSPITAL_BASED_OUTPATIENT_CLINIC_OR_DEPARTMENT_OTHER): Payer: BC Managed Care – PPO

## 2013-03-06 ENCOUNTER — Encounter (HOSPITAL_COMMUNITY): Payer: Self-pay | Admitting: *Deleted

## 2013-03-06 DIAGNOSIS — K219 Gastro-esophageal reflux disease without esophagitis: Secondary | ICD-10-CM | POA: Insufficient documentation

## 2013-03-06 DIAGNOSIS — R112 Nausea with vomiting, unspecified: Secondary | ICD-10-CM | POA: Insufficient documentation

## 2013-03-06 DIAGNOSIS — Z792 Long term (current) use of antibiotics: Secondary | ICD-10-CM | POA: Insufficient documentation

## 2013-03-06 DIAGNOSIS — Z79899 Other long term (current) drug therapy: Secondary | ICD-10-CM | POA: Insufficient documentation

## 2013-03-06 DIAGNOSIS — Z860100 Personal history of colon polyps, unspecified: Secondary | ICD-10-CM | POA: Insufficient documentation

## 2013-03-06 DIAGNOSIS — N949 Unspecified condition associated with female genital organs and menstrual cycle: Secondary | ICD-10-CM | POA: Insufficient documentation

## 2013-03-06 DIAGNOSIS — N83209 Unspecified ovarian cyst, unspecified side: Secondary | ICD-10-CM

## 2013-03-06 DIAGNOSIS — F17200 Nicotine dependence, unspecified, uncomplicated: Secondary | ICD-10-CM | POA: Insufficient documentation

## 2013-03-06 DIAGNOSIS — N898 Other specified noninflammatory disorders of vagina: Secondary | ICD-10-CM | POA: Insufficient documentation

## 2013-03-06 DIAGNOSIS — Z3202 Encounter for pregnancy test, result negative: Secondary | ICD-10-CM | POA: Insufficient documentation

## 2013-03-06 DIAGNOSIS — J45909 Unspecified asthma, uncomplicated: Secondary | ICD-10-CM | POA: Insufficient documentation

## 2013-03-06 HISTORY — DX: Unspecified asthma, uncomplicated: J45.909

## 2013-03-06 LAB — URINALYSIS, ROUTINE W REFLEX MICROSCOPIC
Hgb urine dipstick: NEGATIVE
Nitrite: NEGATIVE
Protein, ur: NEGATIVE mg/dL
Specific Gravity, Urine: 1.026 (ref 1.005–1.030)
Urobilinogen, UA: 1 mg/dL (ref 0.0–1.0)

## 2013-03-06 LAB — CBC WITH DIFFERENTIAL/PLATELET
Basophils Absolute: 0 10*3/uL (ref 0.0–0.1)
Eosinophils Absolute: 0.3 10*3/uL (ref 0.0–0.7)
Eosinophils Relative: 4 % (ref 0–5)
HCT: 40.4 % (ref 36.0–46.0)
Lymphocytes Relative: 19 % (ref 12–46)
MCH: 31.8 pg (ref 26.0–34.0)
MCHC: 34.4 g/dL (ref 30.0–36.0)
MCV: 92.4 fL (ref 78.0–100.0)
Monocytes Absolute: 0.4 10*3/uL (ref 0.1–1.0)
Platelets: 176 10*3/uL (ref 150–400)
RDW: 12.1 % (ref 11.5–15.5)
WBC: 7.6 10*3/uL (ref 4.0–10.5)

## 2013-03-06 LAB — COMPREHENSIVE METABOLIC PANEL
AST: 16 U/L (ref 0–37)
CO2: 25 mEq/L (ref 19–32)
Calcium: 9.7 mg/dL (ref 8.4–10.5)
Creatinine, Ser: 0.6 mg/dL (ref 0.50–1.10)
GFR calc Af Amer: >90 mL/min (ref >90)
GFR calc non Af Amer: >90 mL/min (ref >90)
Glucose, Bld: 99 mg/dL (ref 70–99)
Sodium: 138 mEq/L (ref 135–145)
Total Protein: 6.7 g/dL (ref 6.0–8.3)

## 2013-03-06 LAB — URINE MICROSCOPIC-ADD ON

## 2013-03-06 LAB — WET PREP, GENITAL: Trich, Wet Prep: NONE SEEN

## 2013-03-06 LAB — PREGNANCY, URINE: Preg Test, Ur: NEGATIVE

## 2013-03-06 MED ORDER — ONDANSETRON HCL 4 MG/2ML IJ SOLN
4.0000 mg | Freq: Once | INTRAMUSCULAR | Status: AC
  Administered 2013-03-06: 4 mg via INTRAVENOUS
  Filled 2013-03-06: qty 2

## 2013-03-06 MED ORDER — IOHEXOL 300 MG/ML  SOLN
50.0000 mL | Freq: Once | INTRAMUSCULAR | Status: AC | PRN
  Administered 2013-03-06: 50 mL via ORAL

## 2013-03-06 MED ORDER — IOHEXOL 300 MG/ML  SOLN
100.0000 mL | Freq: Once | INTRAMUSCULAR | Status: AC | PRN
  Administered 2013-03-06: 100 mL via INTRAVENOUS

## 2013-03-06 MED ORDER — MORPHINE SULFATE 4 MG/ML IJ SOLN
4.0000 mg | Freq: Once | INTRAMUSCULAR | Status: AC
  Administered 2013-03-06: 4 mg via INTRAVENOUS
  Filled 2013-03-06: qty 1

## 2013-03-06 MED ORDER — OXYCODONE-ACETAMINOPHEN 5-325 MG PO TABS: 1.0000 | ORAL_TABLET | ORAL | Status: DC | PRN

## 2013-03-06 MED ORDER — SODIUM CHLORIDE 0.9 % IV BOLUS (SEPSIS)
1000.0000 mL | Freq: Once | INTRAVENOUS | Status: AC
  Administered 2013-03-06: 1000 mL via INTRAVENOUS

## 2013-03-06 MED ORDER — METRONIDAZOLE 500 MG PO TABS: 500.0000 mg | ORAL_TABLET | Freq: Two times a day (BID) | ORAL | Status: DC

## 2013-03-06 NOTE — ED Notes (Signed)
Pt states prescribed pain medication has not worked

## 2013-03-06 NOTE — ED Provider Notes (Signed)
CSN: [id number]     Arrival date & time 03/06/13  4098 History     First MD Initiated Contact with Patient 03/06/13 [phone]     Chief Complaint  Patient presents with  . Abdominal Pain   (Consider location/radiation/quality/duration/timing/severity/associated sxs/prior Treatment) Patient is a 27 y.o. female presenting with abdominal pain.  Abdominal Pain Pain location:  Suprapubic and RLQ Pain quality: sharp   Pain radiates to:  Does not radiate Pain severity:  Severe Onset quality:  Gradual Duration:  3 hours Timing:  Constant Progression:  Worsening Chronicity:  New Context comment:  Began shortly after she woke up this morning. Relieved by:  Nothing Worsened by:  Movement and palpation Ineffective treatments:  None tried Associated symptoms: no chest pain, no constipation, no cough, no diarrhea, no dysuria, no fever, no nausea, no shortness of breath, no vaginal bleeding, no vaginal discharge and no vomiting     Past Medical History  Diagnosis Date  . GERD (gastroesophageal reflux disease)   . Colon polyps    Past Surgical History  Procedure Laterality Date  . Cesarean section     Family History  Problem Relation Age of Onset  . Hyperlipidemia Mother   . Crohn's disease Mother   . Colon polyps Mother   . Hyperlipidemia Father   . Hemophilia Father   . Hemophilia Son    History  Substance Use Topics  . Smoking status: Current Every Day Smoker  . Smokeless tobacco: Not on file  . Alcohol Use: Yes     Comment: social   OB History   Grav Para Term Preterm Abortions TAB SAB Ect Mult Living                 Review of Systems  Constitutional: Negative for fever.  HENT: Negative for congestion.   Respiratory: Negative for cough and shortness of breath.   Cardiovascular: Negative for chest pain.  Gastrointestinal: Positive for abdominal pain. Negative for nausea, vomiting, diarrhea and constipation.  Genitourinary: Negative for dysuria, vaginal bleeding and  vaginal discharge.  All other systems reviewed and are negative.    Allergies  Vicodin  Home Medications   Current Outpatient Rx  Name  Route  Sig  Dispense  Refill  . albuterol (PROVENTIL HFA;VENTOLIN HFA) 108 (90 BASE) MCG/ACT inhaler   Inhalation   Inhale 2 puffs into the lungs every 6 (six) hours as needed. For wheezing   1 Inhaler   0   . amoxicillin (AMOXIL) 875 MG tablet   Oral   Take 1 tablet (875 mg total) by mouth 2 (two) times daily.   20 tablet   0   . diazepam (VALIUM) 5 MG tablet      Take one half tablet as needed for anxiety         . etonogestrel (IMPLANON) 68 MG IMPL implant   Subcutaneous   Inject 1 each into the skin once.         Marland Kitchen ibuprofen (ADVIL,MOTRIN) 200 MG tablet   Oral   Take 400 mg by mouth once as needed. For headache         . Multiple Vitamin (MULITIVITAMIN WITH MINERALS) TABS   Oral   Take 1 tablet by mouth daily.         Marland Kitchen omeprazole (PRILOSEC) 40 MG capsule   Oral   Take 1 capsule (40 mg total) by mouth daily.          LMP 02/08/2013 Physical Exam  Nursing note  and vitals reviewed. Constitutional: She is oriented to person, place, and time. She appears well-developed and well-nourished. No distress.  HENT:  Head: Normocephalic and atraumatic.  Mouth/Throat: Oropharynx is clear and moist.  Eyes: Conjunctivae are normal. Pupils are equal, round, and reactive to light. No scleral icterus.  Neck: Neck supple.  Cardiovascular: Normal rate, regular rhythm, normal heart sounds and intact distal pulses.   No murmur heard. Pulmonary/Chest: Effort normal and breath sounds normal. No stridor. No respiratory distress. She has no rales.  Abdominal: Soft. Bowel sounds are normal. She exhibits no distension. There is tenderness in the right lower quadrant and suprapubic area. There is no rigidity, no rebound and no guarding.  obese  Genitourinary: There is no rash or tenderness on the right labia. There is no rash or tenderness  on the left labia. Uterus is tender. Cervix exhibits no motion tenderness. Right adnexum displays tenderness. Right adnexum displays no mass and no fullness. Left adnexum displays tenderness. Left adnexum displays no mass and no fullness. Vaginal discharge (white) found.  nonfocal pelvic tenderness to palpation.  Musculoskeletal: Normal range of motion.  Neurological: She is alert and oriented to person, place, and time.  Skin: Skin is warm and dry. No rash noted.  Psychiatric: She has a normal mood and affect. Her behavior is normal.    ED Course   Procedures (including critical care time)  Labs Reviewed  WET PREP, GENITAL - Abnormal; Notable for the following:    Yeast Wet Prep HPF POC MODERATE (*)    Clue Cells Wet Prep HPF POC TOO NUMEROUS TO COUNT (*)    WBC, Wet Prep HPF POC TOO NUMEROUS TO COUNT (*)    All other components within normal limits  URINALYSIS, ROUTINE W REFLEX MICROSCOPIC - Abnormal; Notable for the following:    Leukocytes, UA SMALL (*)    All other components within normal limits  URINE MICROSCOPIC-ADD ON - Abnormal; Notable for the following:    Squamous Epithelial / LPF FEW (*)    Bacteria, UA MANY (*)    All other components within normal limits  GC/CHLAMYDIA PROBE AMP  PREGNANCY, URINE  CBC WITH DIFFERENTIAL  COMPREHENSIVE METABOLIC PANEL   Ct Abdomen Pelvis W Contrast  03/06/2013   *RADIOLOGY REPORT*  Clinical Data: Lower quadrant abdominal pain, onset this morning with nausea and WBC 10.8K, past history of colon polyps, GERD, asthma  CT ABDOMEN AND PELVIS WITH CONTRAST  Technique:  Multidetector CT imaging of the abdomen and pelvis was performed following the standard protocol during bolus administration of intravenous contrast.  Contrast: 50mL OMNIPAQUE IOHEXOL 300 MG/ML  SOLN, OMNIPAQUE IOHEXOL 300 MG/ML  SOLN Dilute oral contrast.  Comparison: 10/29/2011  Findings: Minimal dependent atelectasis at lung bases. Liver, spleen, pancreas, kidneys, and  adrenal glands normal appearance. Normal appendix. Stomach and bowel loops normal appearance. Previously seen IUD no longer identified. Right ovary somewhat prominent size, unchanged, but not containing a 2.2 cm diameter area of slightly lower attenuation, question hemorrhagic or complicated cyst.  Stable left adnexa. Stomach and bowel loops normal appearance. Small amount of dependent intermediate attenuation fluid the pelvis question small amount of blood; possibility of a ruptured right ovarian cyst is raised. Newly no mass, adenopathy, free air, hernia, or acute osseous findings.  IMPRESSION: Small amount of intermediate attenuation fluid in pelvis with a new area of slightly lower attenuation identified within the right ovary, cannot exclude a ruptured or complicated / hemorrhagic ovarian cyst. Otherwise negative exam.   Original  Report Authenticated By: Ulyses Southward, M.D.  All radiology studies independently viewed by me.    1. Ovarian cyst     MDM  27 yo female with lower abdominal pain.  Well appearing, nontoxic, normal vitals.  Abd tender in lower abdomen/pelvis.  Improved somewhat with IV morphine.  Labs unremarkable except for clue cells and yeast on wet prep.  (she declines treatment for yeast, "I usually use yogurt".  Obtained CT which shows evidence of ruptured cyst.  Advised follow up with gyn and have given return precautions.    Candyce Churn, MD 03/07/13 [phone]

## 2013-03-06 NOTE — ED Notes (Signed)
RN at bedside

## 2013-03-06 NOTE — ED Notes (Signed)
Pelvic cart is set up at the bedside and ready for the doctor to use. RN is at the bedside starting an IV.

## 2013-03-06 NOTE — ED Notes (Signed)
Here for severe RLQ abdominal pain started today at 0630. Pain 10/10, intolerable, shooting, stabbing. Worsen with movement. Associated symptoms are nausea. No dysuria, very tender to palpate at the RLQ area. Negative CVA tenderness. All organs intact.

## 2013-03-06 NOTE — ED Notes (Signed)
Pt seen earlier today and diagnosed with ovarian cyst. States her pain has worsened and that she is nauseated.

## 2013-03-07 ENCOUNTER — Emergency Department (HOSPITAL_COMMUNITY)
Admission: EM | Admit: 2013-03-07 | Discharge: 2013-03-07 | Disposition: A | Discharge status: Home / Self Care | Payer: BC Managed Care – PPO | Attending: Emergency Medicine | Admitting: Emergency Medicine

## 2013-03-07 ENCOUNTER — Other Ambulatory Visit: Payer: Self-pay | Admitting: Family Medicine

## 2013-03-07 DIAGNOSIS — R102 Pelvic and perineal pain: Secondary | ICD-10-CM

## 2013-03-07 LAB — CBC WITH DIFFERENTIAL/PLATELET
HCT: 38 % (ref 36.0–46.0)
Hemoglobin: 13.5 g/dL (ref 12.0–15.0)
Lymphocytes Relative: 28 % (ref 12–46)
Lymphs Abs: 2.5 10*3/uL (ref 0.7–4.0)
MCV: 90.9 fL (ref 78.0–100.0)
Monocytes Absolute: 0.6 10*3/uL (ref 0.1–1.0)
Monocytes Relative: 6 % (ref 3–12)
Neutro Abs: 5.5 10*3/uL (ref 1.7–7.7)
WBC: 9.1 10*3/uL (ref 4.0–10.5)

## 2013-03-07 MED ORDER — ONDANSETRON 4 MG PO TBDP
4.0000 mg | ORAL_TABLET | Freq: Once | ORAL | Status: AC
  Administered 2013-03-07: 4 mg via ORAL
  Filled 2013-03-07: qty 1

## 2013-03-07 MED ORDER — KETOROLAC TROMETHAMINE 30 MG/ML IJ SOLN
30.0000 mg | Freq: Once | INTRAMUSCULAR | Status: AC
  Administered 2013-03-07: 30 mg via INTRAMUSCULAR
  Filled 2013-03-07: qty 1

## 2013-03-07 MED ORDER — ONDANSETRON 4 MG PO TBDP: ORAL_TABLET | ORAL | Status: DC

## 2013-03-07 MED ORDER — METRONIDAZOLE 500 MG PO TABS
500.0000 mg | ORAL_TABLET | Freq: Once | ORAL | Status: DC
  Filled 2013-03-07: qty 1

## 2013-03-07 NOTE — ED Provider Notes (Signed)
CSN: [id number]     Arrival date & time 03/06/13  2259 History     First MD Initiated Contact with Patient 03/07/13 0120     Chief Complaint  Patient presents with  . Abdominal Pain   (Consider location/radiation/quality/duration/timing/severity/associated sxs/prior Treatment) HPI Comments: Earlier today she had labs.  Pelvic exam, and CT of the abdomen, which reveals she has a ruptured ovarian cyst, positive clue cells, and yeast on her pelvic exam, she was discharged him with a prescription for Flagyl and Percocet.  She states she 1 Percocet, approximately 8 without resolution of her pain.  She also reports one episode of vomiting.  Prior to taking the Percocet.  She was not given any prescriptions for nausea.  She presents this evening with increased pain.  No new symptoms.  She states she reluctantly took Percocet, knowing it would make her nauseated.  Patient is a 27 y.o. female presenting with abdominal pain. The history is provided by the patient.  Abdominal Pain Pain location:  Suprapubic, LLQ and RLQ Pain quality: cramping, fullness and throbbing   Pain radiates to:  Does not radiate Pain severity:  Moderate Onset quality:  Unable to specify Timing:  Constant Progression:  Worsening Chronicity:  New Relieved by:  Nothing Associated symptoms: nausea and vomiting   Associated symptoms: no chest pain, no chills, no dysuria, no fever, no shortness of breath and no vaginal discharge   Risk factors: obesity     Past Medical History  Diagnosis Date  . GERD (gastroesophageal reflux disease)   . Colon polyps   . Allergy-induced asthma    Past Surgical History  Procedure Laterality Date  . Cesarean section     Family History  Problem Relation Age of Onset  . Hyperlipidemia Mother   . Crohn's disease Mother   . Colon polyps Mother   . Hyperlipidemia Father   . Hemophilia Father   . Hemophilia Son    History  Substance Use Topics  . Smoking status: Current Every Day  Smoker  . Smokeless tobacco: Not on file  . Alcohol Use: No     Comment: social   OB History   Grav Para Term Preterm Abortions TAB SAB Ect Mult Living                 Review of Systems  Constitutional: Negative for fever, chills, activity change and appetite change.  Respiratory: Negative for shortness of breath.   Cardiovascular: Negative for chest pain.  Gastrointestinal: Positive for nausea, vomiting and abdominal pain.  Genitourinary: Negative for dysuria and vaginal discharge.  Skin: Negative for rash.  Neurological: Negative for weakness.  All other systems reviewed and are negative.    Allergies  Vicodin and Codeine  Home Medications   Current Outpatient Rx  Name  Route  Sig  Dispense  Refill  . albuterol (PROVENTIL HFA;VENTOLIN HFA) 108 (90 BASE) MCG/ACT inhaler   Inhalation   Inhale 2 puffs into the lungs every 6 (six) hours as needed. For wheezing   1 Inhaler   0   . amoxicillin (AMOXIL) 875 MG tablet   Oral   Take 1 tablet (875 mg total) by mouth 2 (two) times daily.   20 tablet   0   . diazepam (VALIUM) 5 MG tablet      Take one half tablet as needed for anxiety         . etonogestrel (IMPLANON) 68 MG IMPL implant   Subcutaneous   Inject 1 each into  the skin once.         . metroNIDAZOLE (FLAGYL) 500 MG tablet   Oral   Take 1 tablet (500 mg total) by mouth 2 (two) times daily.   14 tablet   0   . omeprazole (PRILOSEC) 40 MG capsule   Oral   Take 1 capsule (40 mg total) by mouth daily.         Marland Kitchen oxyCODONE-acetaminophen (PERCOCET/ROXICET) 5-325 MG per tablet   Oral   Take 1 tablet by mouth every 4 (four) hours as needed for pain.   15 tablet   0   . ondansetron (ZOFRAN ODT) 4 MG disintegrating tablet      4mg  ODT q4 hours prn nausea/vomit   8 tablet   0    BP 115/74  Pulse 87  Temp(Src) 97.9 F (36.6 C)  Resp 18  SpO2 95%  LMP 02/08/2013 Physical Exam  Nursing note and vitals reviewed. Constitutional: She appears  well-developed and well-nourished.  Obese  HENT:  Head: Normocephalic and atraumatic.  Eyes: Pupils are equal, round, and reactive to light.  Neck: Normal range of motion.  Cardiovascular: Normal rate and regular rhythm.   Pulmonary/Chest: Effort normal and breath sounds normal.  Abdominal: Soft. She exhibits no distension. There is tenderness in the right lower quadrant, suprapubic area and left lower quadrant. There is no rebound and no guarding.  Musculoskeletal: Normal range of motion.  Neurological: She is alert.  Skin: Skin is warm. No rash noted.    ED Course   Procedures (including critical care time)  Labs Reviewed  CBC WITH DIFFERENTIAL   Ct Abdomen Pelvis W Contrast  03/06/2013   *RADIOLOGY REPORT*  Clinical Data: Lower quadrant abdominal pain, onset this morning with nausea and WBC 10.8K, past history of colon polyps, GERD, asthma  CT ABDOMEN AND PELVIS WITH CONTRAST  Technique:  Multidetector CT imaging of the abdomen and pelvis was performed following the standard protocol during bolus administration of intravenous contrast.  Contrast: 50mL OMNIPAQUE IOHEXOL 300 MG/ML  SOLN, OMNIPAQUE IOHEXOL 300 MG/ML  SOLN Dilute oral contrast.  Comparison: 10/29/2011  Findings: Minimal dependent atelectasis at lung bases. Liver, spleen, pancreas, kidneys, and adrenal glands normal appearance. Normal appendix. Stomach and bowel loops normal appearance. Previously seen IUD no longer identified. Right ovary somewhat prominent size, unchanged, but not containing a 2.2 cm diameter area of slightly lower attenuation, question hemorrhagic or complicated cyst.  Stable left adnexa. Stomach and bowel loops normal appearance. Small amount of dependent intermediate attenuation fluid the pelvis question small amount of blood; possibility of a ruptured right ovarian cyst is raised. Newly no mass, adenopathy, free air, hernia, or acute osseous findings.  IMPRESSION: Small amount of intermediate  attenuation fluid in pelvis with a new area of slightly lower attenuation identified within the right ovary, cannot exclude a ruptured or complicated / hemorrhagic ovarian cyst. Otherwise negative exam.   Original Report Authenticated By: Ulyses Southward, M.D.   1. Pelvic pain     MDM   We'll attempt pain control, antiemetic  Arman Filter, NP 03/07/13 [phone]     Medical screening examination/treatment/procedure(s) were conducted as a shared visit with non-physician practitioner(s) or resident and myself. I personally evaluated the patient during the encounter and agree with the findings and plan unless otherwise indicated.  Recent diagnosis of right ovarian cyst with likely rupture. Pt states she did not come in for pain control but to check her Hb. Pt has FH of ovarian cyst.  No fevers. Pain improved with time and after nsaids. Exam mild RL abd/ pelvic tenderness, obese, soft, no guarding. Pt comfortable with GYN fup.   Labs Reviewed   CBC WITH DIFFERENTIAL    CT reviewed from earlier today.    Enid Skeens   Enid Skeens, MD 03/07/13 [phone]

## 2013-03-07 NOTE — ED Notes (Signed)
Blanket given to pain.

## 2013-03-08 LAB — GC/CHLAMYDIA PROBE AMP: GC Probe RNA: NEGATIVE

## 2013-04-27 ENCOUNTER — Ambulatory Visit: Payer: BC Managed Care – PPO | Admitting: Family Medicine

## 2013-05-17 ENCOUNTER — Encounter: Payer: Self-pay | Admitting: Internal Medicine

## 2013-05-17 ENCOUNTER — Ambulatory Visit (INDEPENDENT_AMBULATORY_CARE_PROVIDER_SITE_OTHER): Payer: BC Managed Care – PPO | Admitting: Internal Medicine

## 2013-05-17 VITALS — BP 110/76 | HR 85 | Temp 97.8°F | Ht 66.5 in | Wt 233.5 lb

## 2013-05-17 DIAGNOSIS — Z23 Encounter for immunization: Secondary | ICD-10-CM

## 2013-05-17 DIAGNOSIS — J029 Acute pharyngitis, unspecified: Secondary | ICD-10-CM

## 2013-05-17 MED ORDER — METHYLPREDNISOLONE ACETATE 80 MG/ML IJ SUSP
80.0000 mg | Freq: Once | INTRAMUSCULAR | Status: AC
  Administered 2013-05-17: 80 mg via INTRAMUSCULAR

## 2013-05-17 NOTE — Addendum Note (Signed)
Addended by: Patience Musca on: 05/17/2013 04:25 PM   Modules accepted: Orders

## 2013-05-17 NOTE — Patient Instructions (Signed)
Viral Pharyngitis Viral pharyngitis is a viral infection that produces redness, pain, and swelling (inflammation) of the throat. It can spread from person to person (contagious). CAUSES Viral pharyngitis is caused by inhaling a large amount of certain germs called viruses. Many different viruses cause viral pharyngitis. SYMPTOMS Symptoms of viral pharyngitis include:  Sore throat.  Tiredness.  Stuffy nose.  Low-grade fever.  Congestion.  Cough. TREATMENT Treatment includes rest, drinking plenty of fluids, and the use of over-the-counter medication (approved by your caregiver). HOME CARE INSTRUCTIONS   Drink enough fluids to keep your urine clear or pale yellow.  Eat soft, cold foods such as ice cream, frozen ice pops, or gelatin dessert.  Gargle with warm salt water (1 tsp salt per 1 qt of water).  If over age 7, throat lozenges may be used safely.  Only take over-the-counter or prescription medicines for pain, discomfort, or fever as directed by your caregiver. Do not take aspirin. To help prevent spreading viral pharyngitis to others, avoid:  Mouth-to-mouth contact with others.  Sharing utensils for eating and drinking.  Coughing around others. SEEK MEDICAL CARE IF:   You are better in a few days, then become worse.  You have a fever or pain not helped by pain medicines.  There are any other changes that concern you. Document Released: 04/10/2005 Document Revised: 09/23/2011 Document Reviewed: 09/06/2010 [employer] Patient Information 2014 [employer].  

## 2013-05-17 NOTE — Progress Notes (Signed)
HPI  Pt presents to the clinic today with c/o right ear pain and sore throat. She reports this started 4 days ago and seems to be getting worse. She is concerned because in the last 3 months she has had 2 sinus infections and bilateral ear infections. She has been on 2 antibiotics and a steroid taper in the last 3 months. She denies fever, chills or body aches. She has been taking Advil OTC without much relief. She has not had sick contacts that she is aware of.  Review of Systems      Past Medical History  Diagnosis Date  . GERD (gastroesophageal reflux disease)   . Colon polyps   . Allergy-induced asthma     Family History  Problem Relation Age of Onset  . Hyperlipidemia Mother   . Crohn's disease Mother   . Colon polyps Mother   . Hyperlipidemia Father   . Hemophilia Father   . Hemophilia Son     History   Social History  . Marital Status: Single    Spouse Name: N/A    Number of Children: N/A  . Years of Education: N/A   Occupational History  . Not on file.   Social History Main Topics  . Smoking status: Current Every Day Smoker  . Smokeless tobacco: Not on file  . Alcohol Use: No     Comment: social  . Drug Use: No  . Sexual Activity: Yes    Birth Control/ Protection: Implant   Other Topics Concern  . Not on file   Social History Narrative  . No narrative on file    Allergies  Allergen Reactions  . Vicodin [Hydrocodone-Acetaminophen] Itching and Nausea And Vomiting  . Codeine      Constitutional:  Denies headache, fatigue, fever or abrupt weight changes.  HEENT:   Denies eye redness, eye pain, pressure behind the eyes, facial pain, nasal congestion, ringing in the ears, wax buildup, runny nose or bloody nose. Respiratory: Denies cough, difficulty breathing or shortness of breath.  Cardiovascular: Denies chest pain, chest tightness, palpitations or swelling in the hands or feet.   No other specific complaints in a complete review of systems (except  as listed in HPI above).  Objective:   BP 110/76  Pulse 85  Temp(Src) 97.8 F (36.6 C) (Oral)  Ht 5' 6.5" (1.689 m)  Wt 233 lb 8 oz (105.915 kg)  BMI 37.13 kg/m2  SpO2 97%  LMP 04/20/2013 Wt Readings from Last 3 Encounters:  05/17/13 233 lb 8 oz (105.915 kg)  02/26/13 235 lb 12 oz (106.935 kg)  02/04/13 229 lb (103.874 kg)     General: Appears her stated age, well developed, well nourished in NAD. HEENT: Head: normal shape and size; Eyes: sclera white, no icterus, conjunctiva pink, PERRLA and EOMs intact; Ears: Tm's gray and intact, normal light reflex; Nose: mucosa pink and moist, septum midline; Throat/Mouth: + PND. Teeth present, mucosa erythematous and moist, white patchy exudate noted on right tonsillar pillar, no lesions or ulcerations noted.  Neck: Mild tonsillar lymphadenopathy. Neck supple, trachea midline. No massses, lumps or thyromegaly present.  Cardiovascular: Normal rate and rhythm. S1,S2 noted.  No murmur, rubs or gallops noted. No JVD or BLE edema. No carotid bruits noted. Pulmonary/Chest: Normal effort and positive vesicular breath sounds. No respiratory distress. No wheezes, rales or ronchi noted.      Assessment & Plan:   Viral pharyngitis:  Get some rest and drink plenty of water Do salt water gargles for  the sore throat RST negative Flu shot today 80 mg Depo IM today 800 mg Ibuprofen OTC TID for pain/inflammation  RTC as needed or if symptoms persist.

## 2013-05-17 NOTE — Addendum Note (Signed)
Addended by: Patience Musca on: 05/17/2013 04:20 PM   Modules accepted: Orders

## 2013-05-31 ENCOUNTER — Other Ambulatory Visit: Payer: Self-pay | Admitting: Obstetrics and Gynecology

## 2013-06-14 ENCOUNTER — Ambulatory Visit: Payer: BC Managed Care – PPO | Admitting: Family Medicine

## 2013-06-15 ENCOUNTER — Ambulatory Visit (INDEPENDENT_AMBULATORY_CARE_PROVIDER_SITE_OTHER): Payer: BC Managed Care – PPO | Admitting: Family Medicine

## 2013-06-15 ENCOUNTER — Encounter: Payer: Self-pay | Admitting: Family Medicine

## 2013-06-15 VITALS — BP 122/80 | HR 92 | Temp 98.2°F | Ht 66.6 in | Wt 229.0 lb

## 2013-06-15 DIAGNOSIS — H6592 Unspecified nonsuppurative otitis media, left ear: Secondary | ICD-10-CM | POA: Insufficient documentation

## 2013-06-15 DIAGNOSIS — H6590 Unspecified nonsuppurative otitis media, unspecified ear: Secondary | ICD-10-CM

## 2013-06-15 MED ORDER — FLUTICASONE PROPIONATE 50 MCG/ACT NA SUSP: 2.0000 | Freq: Every day | NASAL | Status: DC

## 2013-06-15 NOTE — Assessment & Plan Note (Addendum)
No evidence of infection today Anticipate sinus congestion with ETD leading to R serous otitis. Treat with INS and NSAID and fluids. If not better with this, recommended ENT referral - as well as to eval ?polyp R nasal passage

## 2013-06-15 NOTE — Progress Notes (Signed)
Pre-visit discussion using our clinic review tool. No additional management support is needed unless otherwise documented below in the visit note.  

## 2013-06-15 NOTE — Progress Notes (Signed)
   Subjective:    Patient ID: Michelle Kane, female    DOB: 02-Dec-1985, 27 y.o.   MRN: [id number]  HPI CC: R otalgia  R earache traveling down neck and going up into head for last 1 week.  2d ago noticed swollen glands on right side of head, and also with ST and headache.  + cough and nausea.  Mild PNdrainage.  Seen here 1 mo ago with viral pharyngitis treated with steroid injection.  No fevers/chills, L ear pain, abd pain, vomiting.  No recent wheezing or dyspnea.  No rhinorrhea or significant sinus congestion.  So far has tried mucinex D which did help initially.  Has also taken cough syrup from prior sinus infection.  Has had several URI in last few months.  No sick contacts at home. Smoking <1/2 ppd. + h/o allergy induced asthma - triggers are cats and dogs.  Past Medical History  Diagnosis Date  . GERD (gastroesophageal reflux disease)   . Colon polyps   . Allergy-induced asthma      Review of Systems Per HPI    Objective:   Physical Exam  Nursing note and vitals reviewed. Constitutional: She appears well-developed and well-nourished. No distress.  HENT:  Head: Normocephalic and atraumatic.  Right Ear: Hearing, external ear and ear canal normal.  Left Ear: Hearing, tympanic membrane, external ear and ear canal normal.  Nose: Mucosal edema (right sided) present. No rhinorrhea. Right sinus exhibits no maxillary sinus tenderness and no frontal sinus tenderness. Left sinus exhibits no maxillary sinus tenderness and no frontal sinus tenderness.  Mouth/Throat: Uvula is midline and mucous membranes are normal. Posterior oropharyngeal edema and posterior oropharyngeal erythema present. No oropharyngeal exudate or tonsillar abscesses.  Dull retracted R TM but no erythema or bulge R nasal passage markedly swollen at turbinate ?polyp tissue  Eyes: Conjunctivae and EOM are normal. Pupils are equal, round, and reactive to light. No scleral icterus.  Neck: Normal range of motion.  Neck supple.  Cardiovascular: Normal rate, regular rhythm, normal heart sounds and intact distal pulses.   No murmur heard. Pulmonary/Chest: Effort normal and breath sounds normal. No respiratory distress. She has no wheezes. She has no rales.  Musculoskeletal: She exhibits no edema.  Lymphadenopathy:    She has no cervical adenopathy.  Skin: Skin is warm and dry. No rash noted.       Assessment & Plan:

## 2013-06-15 NOTE — Patient Instructions (Signed)
I think you have a serous otitis - treat with nasal steroid and anti inflammatory Nasal saline irrigation throughout the day. I've sent flonase to your pharmacy and start ibuprofen 400mg  twice daily with food for next 3-5 days. If not improving with above, let us know for ENT referral

## 2013-07-12 ENCOUNTER — Encounter: Payer: Self-pay | Admitting: Internal Medicine

## 2013-07-12 ENCOUNTER — Ambulatory Visit (INDEPENDENT_AMBULATORY_CARE_PROVIDER_SITE_OTHER): Payer: BC Managed Care – PPO | Admitting: Internal Medicine

## 2013-07-12 VITALS — BP 130/78 | HR 91 | Temp 98.2°F | Wt 225.5 lb

## 2013-07-12 DIAGNOSIS — J209 Acute bronchitis, unspecified: Secondary | ICD-10-CM

## 2013-07-12 MED ORDER — HYDROCODONE-HOMATROPINE 5-1.5 MG/5ML PO SYRP: 5.0000 mL | ORAL_SOLUTION | Freq: Three times a day (TID) | ORAL | Status: DC | PRN

## 2013-07-12 MED ORDER — AZITHROMYCIN 250 MG PO TABS: ORAL_TABLET | ORAL | Status: DC

## 2013-07-12 NOTE — Progress Notes (Signed)
HPI  Pt presents to the clinic today with c/o cold symptoms x 3 days. She has had low grade fever, chills, body aches. The cough started over the weekend. She is coughing up mucous but doesn't look to see what color it is. She is taking mucinex and tylenol. She has a history of allergies and asthma. She has had sick contacts.  Review of Systems      Past Medical History  Diagnosis Date  . GERD (gastroesophageal reflux disease)   . Colon polyps   . Allergy-induced asthma     Family History  Problem Relation Age of Onset  . Hyperlipidemia Mother   . Crohn's disease Mother   . Colon polyps Mother   . Hyperlipidemia Father   . Hemophilia Father   . Hemophilia Son     History   Social History  . Marital Status: Single    Spouse Name: N/A    Number of Children: N/A  . Years of Education: N/A   Occupational History  . Not on file.   Social History Main Topics  . Smoking status: Current Every Day Smoker  . Smokeless tobacco: Not on file  . Alcohol Use: No     Comment: social  . Drug Use: No  . Sexual Activity: Yes    Birth Control/ Protection: Implant   Other Topics Concern  . Not on file   Social History Narrative  . No narrative on file    Allergies  Allergen Reactions  . Vicodin [Hydrocodone-Acetaminophen] Itching and Nausea And Vomiting  . Codeine      Constitutional: Positive headache, fatigue and fever. Denies abrupt weight changes.  HEENT:  Positive sore throat. Denies eye redness, eye pain, pressure behind the eyes, facial pain, nasal congestion, ear pain, ringing in the ears, wax buildup, runny nose or bloody nose. Respiratory: Positive cough. Denies difficulty breathing or shortness of breath.  Cardiovascular: Denies chest pain, chest tightness, palpitations or swelling in the hands or feet.   No other specific complaints in a complete review of systems (except as listed in HPI above).  Objective:   BP 130/78  Pulse 91  Temp(Src) 98.2 F (36.8  C) (Oral)  Wt 225 lb 8 oz (102.286 kg)  SpO2 98%  LMP 05/20/2013 Wt Readings from Last 3 Encounters:  07/12/13 225 lb 8 oz (102.286 kg)  06/15/13 229 lb (103.874 kg)  05/17/13 233 lb 8 oz (105.915 kg)     General: Appears her stated age, well developed, well nourished in NAD. HEENT: Head: normal shape and size; Eyes: sclera white, no icterus, conjunctiva pink, PERRLA and EOMs intact; Ears: Tm's gray and intact, normal light reflex; Nose: mucosa pink and moist, septum midline; Throat/Mouth: + PND. Teeth present, mucosa erythematous and moist, no exudate noted, no lesions or ulcerations noted.  Neck: Mild cervical lymphadenopathy. Neck supple, trachea midline. No massses, lumps or thyromegaly present.  Cardiovascular: Normal rate and rhythm. S1,S2 noted.  No murmur, rubs or gallops noted. No JVD or BLE edema. No carotid bruits noted. Pulmonary/Chest: Normal effort and bilateral expiratory wheeze. No respiratory distress. No rales or ronchi noted.      Assessment & Plan:   Acute Bronchitis  Get some rest and drink plenty of water Do salt water gargles for the sore throat eRx for Azithromax x 5 days eRx for Hycodan cough syrup  RTC as needed or if symptoms persist.

## 2013-07-12 NOTE — Patient Instructions (Signed)
Acute Bronchitis Bronchitis is inflammation of the airways that extend from the windpipe into the lungs (bronchi). The inflammation often causes mucus to develop. This leads to a cough, which is the most common symptom of bronchitis.  In acute bronchitis, the condition usually develops suddenly and goes away over time, usually in a couple weeks. Smoking, allergies, and asthma can make bronchitis worse. Repeated episodes of bronchitis may cause further lung problems.  CAUSES Acute bronchitis is most often caused by the same virus that causes a cold. The virus can spread from person to person (contagious).  SIGNS AND SYMPTOMS   Cough.   Fever.   Coughing up mucus.   Body aches.   Chest congestion.   Chills.   Shortness of breath.   Sore throat.  DIAGNOSIS  Acute bronchitis is usually diagnosed through a physical exam. Tests, such as chest X-rays, are sometimes done to rule out other conditions.  TREATMENT  Acute bronchitis usually goes away in a couple weeks. Often times, no medical treatment is necessary. Medicines are sometimes given for relief of fever or cough. Antibiotics are usually not needed but may be prescribed in certain situations. In some cases, an inhaler may be recommended to help reduce shortness of breath and control the cough. A cool mist vaporizer may also be used to help thin bronchial secretions and make it easier to clear the chest.  HOME CARE INSTRUCTIONS  Get plenty of rest.   Drink enough fluids to keep your urine clear or pale yellow (unless you have a medical condition that requires fluid restriction). Increasing fluids may help thin your secretions and will prevent dehydration.   Only take over-the-counter or prescription medicines as directed by your health care provider.   Avoid smoking and secondhand smoke. Exposure to cigarette smoke or irritating chemicals will make bronchitis worse. If you are a smoker, consider using nicotine gum or skin  patches to help control withdrawal symptoms. Quitting smoking will help your lungs heal faster.   Reduce the chances of another bout of acute bronchitis by washing your hands frequently, avoiding people with cold symptoms, and trying not to touch your hands to your mouth, nose, or eyes.   Follow up with your health care provider as directed.  SEEK MEDICAL CARE IF: Your symptoms do not improve after 1 week of treatment.  SEEK IMMEDIATE MEDICAL CARE IF:  You develop an increased fever or chills.   You have chest pain.   You have severe shortness of breath.  You have bloody sputum.   You develop dehydration.  You develop fainting.  You develop repeated vomiting.  You develop a severe headache. MAKE SURE YOU:   Understand these instructions.  Will watch your condition.  Will get help right away if you are not doing well or get worse. Document Released: 08/08/2004 Document Revised: 03/03/2013 Document Reviewed: 12/22/2012 [employer] Patient Information 2014 [employer].  

## 2013-07-12 NOTE — Progress Notes (Signed)
Pre-visit discussion using our clinic review tool. No additional management support is needed unless otherwise documented below in the visit note.  

## 2013-07-27 ENCOUNTER — Other Ambulatory Visit (INDEPENDENT_AMBULATORY_CARE_PROVIDER_SITE_OTHER): Payer: Self-pay | Admitting: Otolaryngology

## 2013-07-27 DIAGNOSIS — J329 Chronic sinusitis, unspecified: Secondary | ICD-10-CM

## 2013-08-03 ENCOUNTER — Ambulatory Visit (INDEPENDENT_AMBULATORY_CARE_PROVIDER_SITE_OTHER): Payer: BC Managed Care – PPO | Admitting: Family Medicine

## 2013-08-03 ENCOUNTER — Encounter: Payer: Self-pay | Admitting: Family Medicine

## 2013-08-03 VITALS — BP 118/68 | HR 92 | Temp 98.1°F | Ht 66.6 in | Wt 225.8 lb

## 2013-08-03 DIAGNOSIS — J101 Influenza due to other identified influenza virus with other respiratory manifestations: Secondary | ICD-10-CM | POA: Insufficient documentation

## 2013-08-03 DIAGNOSIS — J111 Influenza due to unidentified influenza virus with other respiratory manifestations: Secondary | ICD-10-CM

## 2013-08-03 DIAGNOSIS — R6883 Chills (without fever): Secondary | ICD-10-CM

## 2013-08-03 LAB — POCT INFLUENZA A/B
INFLUENZA A, POC: POSITIVE
INFLUENZA B, POC: POSITIVE

## 2013-08-03 MED ORDER — ALBUTEROL SULFATE HFA 108 (90 BASE) MCG/ACT IN AERS: INHALATION_SPRAY | RESPIRATORY_TRACT | Status: DC

## 2013-08-03 MED ORDER — OSELTAMIVIR PHOSPHATE 75 MG PO CAPS: 75.0000 mg | ORAL_CAPSULE | Freq: Two times a day (BID) | ORAL | Status: DC

## 2013-08-03 MED ORDER — PREDNISONE 10 MG PO TABS: ORAL_TABLET | ORAL | Status: DC

## 2013-08-03 NOTE — Patient Instructions (Addendum)
You have the flu  Get rest and drink lots of fluids  Off work this week  Try zyrtec for runny nose if needed  Take tamiflu as directed  Use inhaler as needed If wheezing worsens- alert me and fill the px for prednisone     Influenza, Adult Influenza ("the flu") is a viral infection of the respiratory tract. It occurs more often in winter months because people spend more time in close contact with one another. Influenza can make you feel very sick. Influenza easily spreads from person to person (contagious). CAUSES  Influenza is caused by a virus that infects the respiratory tract. You can catch the virus by breathing in droplets from an infected person's cough or sneeze. You can also catch the virus by touching something that was recently contaminated with the virus and then touching your mouth, nose, or eyes. SYMPTOMS  Symptoms typically last 4 to 10 days and may include:  Fever.  Chills.  Headache, body aches, and muscle aches.  Sore throat.  Chest discomfort and cough.  Poor appetite.  Weakness or feeling tired.  Dizziness.  Nausea or vomiting. DIAGNOSIS  Diagnosis of influenza is often made based on your history and a physical exam. A nose or throat swab test can be done to confirm the diagnosis. RISKS AND COMPLICATIONS You may be at risk for a more severe case of influenza if you smoke cigarettes, have diabetes, have chronic heart disease (such as heart failure) or lung disease (such as asthma), or if you have a weakened immune system. Elderly people and pregnant women are also at risk for more serious infections. The most common complication of influenza is a lung infection (pneumonia). Sometimes, this complication can require emergency medical care and may be life-threatening. PREVENTION  An annual influenza vaccination (flu shot) is the best way to avoid getting influenza. An annual flu shot is now routinely recommended for all adults in the [place] TREATMENT  In mild  cases, influenza goes away on its own. Treatment is directed at relieving symptoms. For more severe cases, your caregiver may prescribe antiviral medicines to shorten the sickness. Antibiotic medicines are not effective, because the infection is caused by a virus, not by bacteria. HOME CARE INSTRUCTIONS  Only take over-the-counter or prescription medicines for pain, discomfort, or fever as directed by your caregiver.  Use a cool mist humidifier to make breathing easier.  Get plenty of rest until your temperature returns to normal. This usually takes 3 to 4 days.  Drink enough fluids to keep your urine clear or pale yellow.  Cover your mouth and nose when coughing or sneezing, and wash your hands well to avoid spreading the virus.  Stay home from work or school until your fever has been gone for at least 1 full day. SEEK MEDICAL CARE IF:   You have chest pain or a deep cough that worsens or produces more mucus.  You have nausea, vomiting, or diarrhea. SEEK IMMEDIATE MEDICAL CARE IF:   You have difficulty breathing, shortness of breath, or your skin or nails turn bluish.  You have severe neck pain or stiffness.  You have a severe headache, facial pain, or earache.  You have a worsening or recurring fever.  You have nausea or vomiting that cannot be controlled. MAKE SURE YOU:  Understand these instructions.  Will watch your condition.  Will get help right away if you are not doing well or get worse. Document Released: 06/28/2000 Document Revised: 12/31/2011 Document Reviewed: 09/30/2011 [employer]  Patient Information 2014 [employer].  

## 2013-08-03 NOTE — Progress Notes (Signed)
Pre-visit discussion using our clinic review tool. No additional management support is needed unless otherwise documented below in the visit note.  

## 2013-08-03 NOTE — Progress Notes (Signed)
Subjective:    Patient ID: Michelle Kane, female    DOB: 1986-04-04, 28 y.o.   MRN: [id number]  HPI Here today with fever and congestion  Pos rapid flu test today   Was exp to flu at work  Fever was 100.0 yesterday  Bad body aches and chills  Nasal congestion   No otc med   Right now temp is down   Some loose stool for 2 d also  No vomiting/ some nausea   Patient Active Problem List   Diagnosis Date Noted  . Obesity, unspecified 01/12/2013  . Fatty liver 01/12/2013  . GERD (gastroesophageal reflux disease)    Past Medical History  Diagnosis Date  . GERD (gastroesophageal reflux disease)   . Colon polyps   . Allergy-induced asthma    Past Surgical History  Procedure Laterality Date  . Cesarean section     History  Substance Use Topics  . Smoking status: Current Every Day Smoker  . Smokeless tobacco: Not on file  . Alcohol Use: No     Comment: social   Family History  Problem Relation Age of Onset  . Hyperlipidemia Mother   . Crohn's disease Mother   . Colon polyps Mother   . Hyperlipidemia Father   . Hemophilia Father   . Hemophilia Son    Allergies  Allergen Reactions  . Vicodin [Hydrocodone-Acetaminophen] Itching and Nausea And Vomiting  . Codeine    Current Outpatient Prescriptions on File Prior to Visit  Medication Sig Dispense Refill  . diazepam (VALIUM) 5 MG tablet Take one half tablet as needed for anxiety      . fluticasone (FLONASE) 50 MCG/ACT nasal spray Place 2 sprays into both nostrils daily.  16 g  3  . omeprazole (PRILOSEC) 40 MG capsule Take 1 capsule (40 mg total) by mouth daily.      Marland Kitchen PROVENTIL HFA 108 (90 BASE) MCG/ACT inhaler INHALE TWO PUFFS BY MOUTH EVERY 6 HOURS AS NEEDED FOR WHEEZING  6.7 g  4   No current facility-administered medications on file prior to visit.      Review of Systems    Review of Systems  Constitutional:pos for fever and malaise and dec appetitie ENt pos for cong/ rhinorrhea  Eyes: Negative for pain  and visual disturbance.  Respiratory: Negative for wheeze  and shortness of breath.   Cardiovascular: Negative for cp or palpitations    Gastrointestinal: Negative for constipation.neg for blood in stool  Genitourinary: Negative for urgency and frequency.  Skin: Negative for pallor or rash   Neurological: Negative for weakness, light-headedness, numbness and headaches.  Hematological: Negative for adenopathy. Does not bruise/bleed easily.  Psychiatric/Behavioral: Negative for dysphoric mood. The patient is not nervous/anxious.      Objective:   Physical Exam  Constitutional: She appears well-developed and well-nourished. No distress.  obese and well appearing   HENT:  Head: Normocephalic and atraumatic.  Right Ear: External ear normal.  Left Ear: External ear normal.  Mouth/Throat: Oropharynx is clear and moist. No oropharyngeal exudate.  Nares are injected and congested   Throat - post injection No sinus tenderness   Eyes: Conjunctivae are normal. Pupils are equal, round, and reactive to light. Right eye exhibits no discharge. Left eye exhibits no discharge.  Neck: Normal range of motion. Neck supple.  Cardiovascular: Regular rhythm.  Exam reveals no gallop.   Pulmonary/Chest: Effort normal. No respiratory distress. She has wheezes. She has no rales.  Scant scattered wheeze No prolonged exp phase  Lymphadenopathy:    She has no cervical adenopathy.  Neurological: She is alert.  Skin: Skin is warm and dry. No rash noted.  Psychiatric: She has a normal mood and affect.          Assessment & Plan:

## 2013-08-04 NOTE — Assessment & Plan Note (Signed)
Px tamiflu -disc poss side eff and pros/cons Disc symptomatic care - see instructions on AVS  Update if not starting to improve in a week or if worsening

## 2013-08-05 ENCOUNTER — Ambulatory Visit
Admission: RE | Admit: 2013-08-05 | Discharge: 2013-08-05 | Disposition: A | Discharge status: Home / Self Care | Payer: BC Managed Care – PPO | Source: Ambulatory Visit | Attending: Otolaryngology | Admitting: Otolaryngology

## 2013-08-05 DIAGNOSIS — J329 Chronic sinusitis, unspecified: Secondary | ICD-10-CM

## 2013-08-08 ENCOUNTER — Encounter (HOSPITAL_BASED_OUTPATIENT_CLINIC_OR_DEPARTMENT_OTHER): Payer: Self-pay | Admitting: Emergency Medicine

## 2013-08-08 ENCOUNTER — Emergency Department (HOSPITAL_BASED_OUTPATIENT_CLINIC_OR_DEPARTMENT_OTHER)
Admission: EM | Admit: 2013-08-08 | Discharge: 2013-08-09 | Disposition: A | Discharge status: Home / Self Care | Payer: BC Managed Care – PPO | Attending: Emergency Medicine | Admitting: Emergency Medicine

## 2013-08-08 DIAGNOSIS — F17200 Nicotine dependence, unspecified, uncomplicated: Secondary | ICD-10-CM | POA: Insufficient documentation

## 2013-08-08 DIAGNOSIS — R51 Headache: Secondary | ICD-10-CM | POA: Insufficient documentation

## 2013-08-08 DIAGNOSIS — R519 Headache, unspecified: Secondary | ICD-10-CM

## 2013-08-08 DIAGNOSIS — K219 Gastro-esophageal reflux disease without esophagitis: Secondary | ICD-10-CM | POA: Insufficient documentation

## 2013-08-08 DIAGNOSIS — Z860100 Personal history of colon polyps, unspecified: Secondary | ICD-10-CM | POA: Insufficient documentation

## 2013-08-08 DIAGNOSIS — H538 Other visual disturbances: Secondary | ICD-10-CM | POA: Insufficient documentation

## 2013-08-08 DIAGNOSIS — J019 Acute sinusitis, unspecified: Secondary | ICD-10-CM | POA: Insufficient documentation

## 2013-08-08 DIAGNOSIS — Z79899 Other long term (current) drug therapy: Secondary | ICD-10-CM | POA: Insufficient documentation

## 2013-08-08 DIAGNOSIS — J45909 Unspecified asthma, uncomplicated: Secondary | ICD-10-CM | POA: Insufficient documentation

## 2013-08-08 DIAGNOSIS — R110 Nausea: Secondary | ICD-10-CM | POA: Insufficient documentation

## 2013-08-08 DIAGNOSIS — IMO0002 Reserved for concepts with insufficient information to code with codable children: Secondary | ICD-10-CM | POA: Insufficient documentation

## 2013-08-08 LAB — HCG, SERUM, QUALITATIVE: PREG SERUM: NEGATIVE

## 2013-08-08 MED ORDER — SODIUM CHLORIDE 0.9 % IV BOLUS (SEPSIS)
1000.0000 mL | Freq: Once | INTRAVENOUS | Status: AC
  Administered 2013-08-08: 1000 mL via INTRAVENOUS

## 2013-08-08 MED ORDER — METOCLOPRAMIDE HCL 5 MG/ML IJ SOLN
10.0000 mg | Freq: Once | INTRAMUSCULAR | Status: AC
  Administered 2013-08-08: 10 mg via INTRAVENOUS
  Filled 2013-08-08: qty 2

## 2013-08-08 MED ORDER — DIPHENHYDRAMINE HCL 50 MG/ML IJ SOLN
25.0000 mg | Freq: Once | INTRAMUSCULAR | Status: AC
  Administered 2013-08-08: 25 mg via INTRAVENOUS
  Filled 2013-08-08: qty 1

## 2013-08-08 MED ORDER — KETOROLAC TROMETHAMINE 30 MG/ML IJ SOLN
30.0000 mg | Freq: Once | INTRAMUSCULAR | Status: AC
  Administered 2013-08-08: 30 mg via INTRAVENOUS
  Filled 2013-08-08: qty 1

## 2013-08-08 NOTE — ED Notes (Signed)
Pt reports migraine that started 1 hour ago.  Reports back neck and chest pain also.  Reports nausea, denies vomiting.

## 2013-08-08 NOTE — ED Provider Notes (Signed)
CSN: [id number]     Arrival date & time 08/08/13  2232 History  This chart was scribed for Michelle Speak, MD by Maree Erie, ED Scribe. The patient was seen in room [name]. Patient's care was started at 11:08 PM.    Chief Complaint  Patient presents with  . Migraine   Patient is a 28 y.o. female presenting with migraines. The history is provided by the patient and a parent. No language interpreter was used.  Migraine    HPI Comments: Michelle Kane is a 28 y.o. female who presents to the Emergency Department complaining of a constant, moderate, migraine that began an hour ago. She states the migraine is causing "pain to shoot down to her neck and chest", nausea and blurred vision. She reports a history of migraines but denies any have been as severe or caused the shooting pain. She denies ever having to come to the ED for a migraine in the past. She had a head CT done Thursday that showed sinusitis but was otherwise normal. She denies fever. Her mother states there is a possibility she could be pregnant. She states she has an allergy to Vicodin that causes vomiting and itching.   Past Medical History  Diagnosis Date  . GERD (gastroesophageal reflux disease)   . Colon polyps   . Allergy-induced asthma    Past Surgical History  Procedure Laterality Date  . Cesarean section     Family History  Problem Relation Age of Onset  . Hyperlipidemia Mother   . Crohn's disease Mother   . Colon polyps Mother   . Hyperlipidemia Father   . Hemophilia Father   . Hemophilia Son    History  Substance Use Topics  . Smoking status: Current Every Day Smoker -- 0.50 packs/day    Types: Cigarettes  . Smokeless tobacco: Not on file  . Alcohol Use: No     Comment: social   OB History   Grav Para Term Preterm Abortions TAB SAB Ect Mult Living                 Review of Systems A complete 10 system review of systems was obtained and all systems are negative except as noted in the HPI and  PMH.    Allergies  Vicodin  Home Medications   Current Outpatient Rx  Name  Route  Sig  Dispense  Refill  . albuterol (PROVENTIL HFA) 108 (90 BASE) MCG/ACT inhaler      INHALE TWO PUFFS BY MOUTH EVERY 4  HOURS AS NEEDED FOR WHEEZING   6.7 g   2   . diazepam (VALIUM) 5 MG tablet      Take one half tablet as needed for anxiety         . fluticasone (FLONASE) 50 MCG/ACT nasal spray   Each Nare   Place 2 sprays into both nostrils daily.   16 g   3   . omeprazole (PRILOSEC) 40 MG capsule   Oral   Take 1 capsule (40 mg total) by mouth daily.          Triage Vitals: BP 140/96  Pulse 86  Temp(Src) 98.2 F (36.8 C) (Oral)  Resp 18  Ht 5\' 6"  (1.676 m)  Wt 220 lb (99.791 kg)  BMI 35.53 kg/m2  SpO2 100%  LMP 07/19/2013  Physical Exam  Nursing note and vitals reviewed. Constitutional: She is oriented to person, place, and time. She appears well-developed and well-nourished. No distress.  HENT:  Head:  Normocephalic and atraumatic.  Eyes: EOM are normal. Pupils are equal, round, and reactive to light.  Neck: Normal range of motion. Neck supple. No tracheal deviation present.  Cardiovascular: Normal rate and regular rhythm.   Pulmonary/Chest: Effort normal and breath sounds normal. No respiratory distress.  Abdominal: Soft.  Musculoskeletal: Normal range of motion.  Neurological: She is alert and oriented to person, place, and time. No cranial nerve deficit.  Skin: Skin is warm and dry.  Psychiatric: She has a normal mood and affect. Her behavior is normal.    ED Course  Procedures (including critical care time)  DIAGNOSTIC STUDIES: Oxygen Saturation is 100% on room air, normal by my interpretation.    COORDINATION OF CARE: 11:11 PM -Will order migraine cocktail and pregnancy test. Patient verbalizes understanding and agrees with treatment plan.    Labs Review Labs Reviewed - No data to display Imaging Review No results found.  EKG Interpretation     Date/Time:  Sunday August 08 2013 22:41:30 EST Ventricular Rate:  85 PR Interval:  148 QRS Duration: 90 QT Interval:  352 QTC Calculation: 418 R Axis:   24 Text Interpretation:  Normal sinus rhythm Normal ECG No significant change since last tracing Confirmed by ZACKOWSKI  MD, SCOTT ([id number]) on 08/08/2013 10:46:16 PM            MDM  No diagnosis found. Patient presents with a migraine headache. She has had sinus pain as well and saw her primary Dr. 2 days ago for the same. She had a CAT scan performed which he did not know the results of. I have looked this up and found that she appears to have acute sinusitis. She is feeling better with the migraine cocktail in the ER and appears very stable for discharge with antibiotics and pain medication. To return as needed if her symptoms worsen or change.   I personally performed the services described in this documentation, which was scribed in my presence. The recorded information has been reviewed and is accurate.      Michelle Speak, MD 08/09/13 [phone]

## 2013-08-09 MED ORDER — OXYCODONE-ACETAMINOPHEN 5-325 MG PO TABS: 2.0000 | ORAL_TABLET | ORAL | Status: DC | PRN

## 2013-08-09 MED ORDER — AMOXICILLIN-POT CLAVULANATE 500-125 MG PO TABS: 1.0000 | ORAL_TABLET | Freq: Three times a day (TID) | ORAL | Status: DC

## 2013-08-09 NOTE — Discharge Instructions (Signed)
Augmentin as prescribed.  Percocet as prescribed as needed for pain.  Return to the ER if your pain substantially worsens or you develop new or concerning symptoms.   Sinus Headache A sinus headache is when your sinuses become clogged or swollen. Sinus headaches can range from mild to severe.  CAUSES A sinus headache can have different causes, such as:  Colds.  Sinus infections.  Allergies. SYMPTOMS  Symptoms of a sinus headache may vary and can include:  Headache.  Pain or pressure in the face.  Congested or runny nose.  Fever.  Inability to smell.  Pain in upper teeth. Weather changes can make symptoms worse. TREATMENT  The treatment of a sinus headache depends on the cause.  Sinus pain caused by a sinus infection may be treated with antibiotic medicine.  Sinus pain caused by allergies may be helped by allergy medicines (antihistamines) and medicated nasal sprays.  Sinus pain caused by congestion may be helped by flushing the nose and sinuses with saline solution. HOME CARE INSTRUCTIONS   If antibiotics are prescribed, take them as directed. Finish them even if you start to feel better.  Only take over-the-counter or prescription medicines for pain, discomfort, or fever as directed by your caregiver.  If you have congestion, use a nasal spray to help reduce pressure. SEEK IMMEDIATE MEDICAL CARE IF:  You have a fever.  You have headaches more than once a week.  You have sensitivity to light or sound.  You have repeated nausea and vomiting.  You have vision problems.  You have sudden, severe pain in your face or head.  You have a seizure.  You are confused.  Your sinus headaches do not get better after treatment. Many people think they have a sinus headache when they actually have migraines or tension headaches. MAKE SURE YOU:   Understand these instructions.  Will watch your condition.  Will get help right away if you are not doing well or get  worse. Document Released: 08/08/2004 Document Revised: 09/23/2011 Document Reviewed: 09/29/2010 [facility] Patient Information 2014 [place].

## 2013-08-17 ENCOUNTER — Telehealth: Payer: Self-pay | Admitting: Family Medicine

## 2013-08-17 NOTE — Telephone Encounter (Signed)
Relevant patient education assigned to patient using Emmi. ° °

## 2013-09-10 ENCOUNTER — Inpatient Hospital Stay (HOSPITAL_COMMUNITY)
Admission: AD | Admit: 2013-09-10 | Discharge: 2013-09-10 | Disposition: A | Discharge status: Home / Self Care | Payer: BC Managed Care – PPO | Source: Ambulatory Visit | Attending: Obstetrics and Gynecology | Admitting: Obstetrics and Gynecology

## 2013-09-10 ENCOUNTER — Encounter (HOSPITAL_COMMUNITY): Payer: Self-pay | Admitting: *Deleted

## 2013-09-10 DIAGNOSIS — R1013 Epigastric pain: Secondary | ICD-10-CM

## 2013-09-10 DIAGNOSIS — G8929 Other chronic pain: Secondary | ICD-10-CM

## 2013-09-10 DIAGNOSIS — K219 Gastro-esophageal reflux disease without esophagitis: Secondary | ICD-10-CM | POA: Insufficient documentation

## 2013-09-10 DIAGNOSIS — Z3202 Encounter for pregnancy test, result negative: Secondary | ICD-10-CM

## 2013-09-10 DIAGNOSIS — F17200 Nicotine dependence, unspecified, uncomplicated: Secondary | ICD-10-CM | POA: Insufficient documentation

## 2013-09-10 DIAGNOSIS — R109 Unspecified abdominal pain: Secondary | ICD-10-CM | POA: Insufficient documentation

## 2013-09-10 LAB — URINALYSIS, ROUTINE W REFLEX MICROSCOPIC
BILIRUBIN URINE: NEGATIVE
Glucose, UA: NEGATIVE mg/dL
Hgb urine dipstick: NEGATIVE
Ketones, ur: NEGATIVE mg/dL
Leukocytes, UA: NEGATIVE
NITRITE: NEGATIVE
Protein, ur: NEGATIVE mg/dL
Specific Gravity, Urine: 1.01 (ref 1.005–1.030)
UROBILINOGEN UA: 0.2 mg/dL (ref 0.0–1.0)
pH: 7.5 (ref 5.0–8.0)

## 2013-09-10 LAB — WET PREP, GENITAL
CLUE CELLS WET PREP: NONE SEEN
Trich, Wet Prep: NONE SEEN
Yeast Wet Prep HPF POC: NONE SEEN

## 2013-09-10 LAB — POCT PREGNANCY, URINE: PREG TEST UR: NEGATIVE

## 2013-09-10 LAB — HCG, SERUM, QUALITATIVE: Preg, Serum: NEGATIVE

## 2013-09-10 MED ORDER — KETOROLAC TROMETHAMINE 30 MG/ML IJ SOLN
30.0000 mg | Freq: Once | INTRAMUSCULAR | Status: AC
  Administered 2013-09-10: 30 mg via INTRAVENOUS

## 2013-09-10 MED ORDER — KETOROLAC TROMETHAMINE 30 MG/ML IJ SOLN
INTRAMUSCULAR | Status: AC
  Administered 2013-09-10: 30 mg via INTRAVENOUS
  Filled 2013-09-10: qty 1

## 2013-09-10 NOTE — MAU Provider Note (Signed)
History     CSN: [id number]  Arrival date and time: 09/10/13 1659   None     Chief Complaint  Patient presents with  . Possible Pregnancy  . Abdominal Pain   HPI Michelle Kane is 28 y.o. presents with abdominal pain that began 2 days.  G3 P 2002.  She is a patient of Dr. Christen Butter.  States it is on the left. Describes as intermittent, sharp.  Eases with rest, movement makes it worse.  Rates as 7/10 at its worse and at this time 4/10.  She has not taken anything for pain today. Patient states she was at work and couldn't take pain med.  She reports hx of ? fatty deposit on her liver so she tries not to take Ibuprofen/aleve.  She states she can take if needed.  LMP 08/19/13-lighter than normal.  SHe had a "faint" positive UPT at home earlier this week.   Unable to use contraception--pills, rod, IUD.  She was not using condoms.  Hx of ovarian cysts.  Denies vaginal bleeding.  Past Medical History  Diagnosis Date  . GERD (gastroesophageal reflux disease)   . Colon polyps   . Allergy-induced asthma   . Ovarian cyst     Past Surgical History  Procedure Laterality Date  . Cesarean section      Family History  Problem Relation Age of Onset  . Hyperlipidemia Mother   . Crohn's disease Mother   . Colon polyps Mother   . Hyperlipidemia Father   . Hemophilia Father   . Hemophilia Son     History  Substance Use Topics  . Smoking status: Light Tobacco Smoker -- 0.50 packs/day    Types: Cigarettes  . Smokeless tobacco: Not on file  . Alcohol Use: No     Comment: social    Allergies:  Allergies  Allergen Reactions  . Vicodin [Hydrocodone-Acetaminophen] Itching and Nausea And Vomiting    Prescriptions prior to admission  Medication Sig Dispense Refill  . albuterol (PROVENTIL HFA) 108 (90 BASE) MCG/ACT inhaler INHALE TWO PUFFS BY MOUTH EVERY 4  HOURS AS NEEDED FOR WHEEZING  6.7 g  2  . omeprazole (PRILOSEC) 40 MG capsule Take 1 capsule (40 mg total) by mouth daily.         Review of Systems  Constitutional: Negative for fever.  Gastrointestinal: Positive for abdominal pain.  Genitourinary: Negative for dysuria, urgency and frequency.       Neg for vaginal bleeding or vaginal discharge  Neurological: Negative for headaches.   Physical Exam   Blood pressure 123/81, pulse 91, temperature 98.6 F (37 C), temperature source Oral, resp. rate 16, height 5\' 7"  (1.702 m), weight 219 lb 9.6 oz (99.61 kg), last menstrual period 08/19/2013, SpO2 100.00%.  Physical Exam  Constitutional: She is oriented to person, place, and time. She appears well-developed and well-nourished. No distress.  HENT:  Head: Normocephalic.  Neck: Normal range of motion.  Cardiovascular: Normal rate.   Respiratory: Effort normal.  GI: Soft. She exhibits no distension and no mass. There is tenderness (left sided tenderness without palpable masses). There is no rebound and no guarding.  Genitourinary: There is no rash, tenderness or lesion on the right labia. There is no rash, tenderness or lesion on the left labia. Uterus is not enlarged and not tender. Cervix exhibits no motion tenderness, no discharge and no friability. Right adnexum displays no mass, no tenderness and no fullness. Left adnexum displays tenderness. Left adnexum displays no mass and no  fullness. No erythema, tenderness or bleeding around the vagina. No vaginal discharge found.  Neurological: She is oriented to person, place, and time.  Skin: Skin is warm and dry.  Psychiatric: She has a normal mood and affect. Her behavior is normal.    Results for orders placed during the hospital encounter of 09/10/13 (from the past 24 hour(s))  URINALYSIS, ROUTINE W REFLEX MICROSCOPIC     Status: Abnormal   Collection Time    09/10/13  5:45 PM      Result Value Ref Range   Color, Urine STRAW (*) YELLOW   APPearance CLEAR  CLEAR   Specific Gravity, Urine 1.010  1.005 - 1.030   pH 7.5  5.0 - 8.0   Glucose, UA NEGATIVE  NEGATIVE  mg/dL   Hgb urine dipstick NEGATIVE  NEGATIVE   Bilirubin Urine NEGATIVE  NEGATIVE   Ketones, ur NEGATIVE  NEGATIVE mg/dL   Protein, ur NEGATIVE  NEGATIVE mg/dL   Urobilinogen, UA 0.2  0.0 - 1.0 mg/dL   Nitrite NEGATIVE  NEGATIVE   Leukocytes, UA NEGATIVE  NEGATIVE  POCT PREGNANCY, URINE     Status: None   Collection Time    09/10/13  5:50 PM      Result Value Ref Range   Preg Test, Ur NEGATIVE  NEGATIVE  WET PREP, GENITAL     Status: Abnormal   Collection Time    09/10/13  6:15 PM      Result Value Ref Range   Yeast Wet Prep HPF POC NONE SEEN  NONE SEEN   Trich, Wet Prep NONE SEEN  NONE SEEN   Clue Cells Wet Prep HPF POC NONE SEEN  NONE SEEN   WBC, Wet Prep HPF POC FEW (*) NONE SEEN  HCG, SERUM, QUALITATIVE     Status: None   Collection Time    09/10/13  6:20 PM      Result Value Ref Range   Preg, Serum NEGATIVE  NEGATIVE   MAU Course  Procedures  Toradol 30mg  IM given.    GC/CHL culture to lab-patient instructed to call office for results  MDM Reported MSE to Dr. Radene Knee at 18:10--order for serum UPT, exam and have her follow up in the office on Monday with Dr. Helane Rima.  Assessment and Plan  A:  Abdominal pain      Negative urine and serum pregnancy tests  P:  Instructed to follow up on MOnday with Dr. Helane Rima.       Call MD on call for worsening sxs.       Avoid additional Motrin/ibuprofen/aleve until tomorrow morning  KEY,EVE M 09/10/2013, 7:05 PM

## 2013-09-10 NOTE — MAU Note (Signed)
Patient states she has had a positive home pregnancy test. Has nausea, no vomiting. States she has had left lower abdominal pain for about 3 days. State she has a heavier than normal clear discharge. States her BP has been going up and down.

## 2013-09-10 NOTE — Discharge Instructions (Signed)
Abdominal Pain, Adult °Many things can cause abdominal pain. Usually, abdominal pain is not caused by a disease and will improve without treatment. It can often be observed and treated at home. Your health care provider will do a physical exam and possibly order blood tests and X-rays to help determine the seriousness of your pain. However, in many cases, more time must pass before a clear cause of the pain can be found. Before that point, your health care provider may not know if you need more testing or further treatment. °HOME CARE INSTRUCTIONS  °Monitor your abdominal pain for any changes. The following actions may help to alleviate any discomfort you are experiencing: °· Only take over-the-counter or prescription medicines as directed by your health care provider. °· Do not take laxatives unless directed to do so by your health care provider. °· Try a clear liquid diet (broth, tea, or water) as directed by your health care provider. Slowly move to a bland diet as tolerated. °SEEK MEDICAL CARE IF: °· You have unexplained abdominal pain. °· You have abdominal pain associated with nausea or diarrhea. °· You have pain when you urinate or have a bowel movement. °· You experience abdominal pain that wakes you in the night. °· You have abdominal pain that is worsened or improved by eating food. °· You have abdominal pain that is worsened with eating fatty foods. °SEEK IMMEDIATE MEDICAL CARE IF:  °· Your pain does not go away within 2 hours. °· You have a fever. °· You keep throwing up (vomiting). °· Your pain is felt only in portions of the abdomen, such as the right side or the left lower portion of the abdomen. °· You pass bloody or black tarry stools. °MAKE SURE YOU: °· Understand these instructions.   °· Will watch your condition.   °· Will get help right away if you are not doing well or get worse.   °Document Released: 04/10/2005 Document Revised: 04/21/2013 Document Reviewed: 03/10/2013 °[employer]® Patient  Information ©2014 [employer]. ° °

## 2013-09-11 LAB — GC/CHLAMYDIA PROBE AMP
CT Probe RNA: NEGATIVE
GC PROBE AMP APTIMA: NEGATIVE

## 2013-10-21 ENCOUNTER — Emergency Department (HOSPITAL_BASED_OUTPATIENT_CLINIC_OR_DEPARTMENT_OTHER)
Admission: EM | Admit: 2013-10-21 | Discharge: 2013-10-21 | Disposition: A | Discharge status: Home / Self Care | Payer: BC Managed Care – PPO | Attending: Emergency Medicine | Admitting: Emergency Medicine

## 2013-10-21 ENCOUNTER — Encounter (HOSPITAL_BASED_OUTPATIENT_CLINIC_OR_DEPARTMENT_OTHER): Payer: Self-pay | Admitting: Emergency Medicine

## 2013-10-21 DIAGNOSIS — W540XXA Bitten by dog, initial encounter: Secondary | ICD-10-CM | POA: Insufficient documentation

## 2013-10-21 DIAGNOSIS — Z1401 Asymptomatic hemophilia A carrier: Secondary | ICD-10-CM | POA: Insufficient documentation

## 2013-10-21 DIAGNOSIS — S01309A Unspecified open wound of unspecified ear, initial encounter: Secondary | ICD-10-CM | POA: Insufficient documentation

## 2013-10-21 DIAGNOSIS — Z79899 Other long term (current) drug therapy: Secondary | ICD-10-CM | POA: Insufficient documentation

## 2013-10-21 DIAGNOSIS — Y9389 Activity, other specified: Secondary | ICD-10-CM | POA: Insufficient documentation

## 2013-10-21 DIAGNOSIS — Z860100 Personal history of colon polyps, unspecified: Secondary | ICD-10-CM | POA: Insufficient documentation

## 2013-10-21 DIAGNOSIS — Z8742 Personal history of other diseases of the female genital tract: Secondary | ICD-10-CM | POA: Insufficient documentation

## 2013-10-21 DIAGNOSIS — Y92009 Unspecified place in unspecified non-institutional (private) residence as the place of occurrence of the external cause: Secondary | ICD-10-CM | POA: Insufficient documentation

## 2013-10-21 DIAGNOSIS — S01359A Open bite of unspecified ear, initial encounter: Secondary | ICD-10-CM

## 2013-10-21 DIAGNOSIS — F17200 Nicotine dependence, unspecified, uncomplicated: Secondary | ICD-10-CM | POA: Insufficient documentation

## 2013-10-21 DIAGNOSIS — J45909 Unspecified asthma, uncomplicated: Secondary | ICD-10-CM | POA: Insufficient documentation

## 2013-10-21 DIAGNOSIS — K219 Gastro-esophageal reflux disease without esophagitis: Secondary | ICD-10-CM | POA: Insufficient documentation

## 2013-10-21 HISTORY — DX: Asymptomatic hemophilia A carrier: Z14.01

## 2013-10-21 MED ORDER — AMOXICILLIN-POT CLAVULANATE 875-125 MG PO TABS: 1.0000 | ORAL_TABLET | Freq: Two times a day (BID) | ORAL | Status: DC

## 2013-10-21 NOTE — Discharge Instructions (Signed)

## 2013-10-21 NOTE — ED Provider Notes (Signed)
CSN: [id number]     Arrival date & time 10/21/13  2015 History   First MD Initiated Contact with Patient 10/21/13 2146    This chart was scribed for Osvaldo Shipper, MD by Terressa Koyanagi, ED Scribe. This patient was seen in room MH01/MH01 and the patient's care was started at 9:47 PM.  Chief Complaint  Patient presents with  . Animal Bite    Patient is a 28 y.o. female presenting with animal bite. The history is provided by the patient. No language interpreter was used.  Animal Bite Contact animal:  Dog Pain details:    Severity:  Mild   Progression:  Partially resolved Incident location:  Home Provoked: playing with puppy he was pulling on pt's hair when pt sustained bite to her right ear.   Notifications:  None Animal's rabies vaccination status:  Up to date Animal in possession: yes   Tetanus status:  Up to date  HPI Comments: SHAILEY Kane is a 28 y.o. female who presents to the Emergency Department complaining of a dog bite on her right ear from earlier today. Pt reports that she was playing with her puppy (who is UTD on all of his vaccines) when he bit her ear. Pt reports that her last tetnus shot was in 01/2013.   Past Medical History  Diagnosis Date  . GERD (gastroesophageal reflux disease)   . Colon polyps   . Allergy-induced asthma   . Ovarian cyst   . 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 Cesarean section     Family History  Problem Relation Age of Onset  . Hyperlipidemia Mother   . Crohn's disease Mother   . Colon polyps Mother   . Hyperlipidemia Father   . Hemophilia Father   . Hemophilia Son    History  Substance Use Topics  . Smoking status: Light Tobacco Smoker -- 0.50 packs/day    Types: Cigarettes  . Smokeless tobacco: Not on file  . Alcohol Use: No     Comment: social   OB History   Grav Para Term Preterm Abortions TAB SAB Ect Mult Living   2 2 2       2      Review of Systems  HENT:        Laceration on right ear      Allergies  Vicodin  Home Medications   Current Outpatient Rx  Name  Route  Sig  Dispense  Refill  . albuterol (PROVENTIL HFA) 108 (90 BASE) MCG/ACT inhaler      INHALE TWO PUFFS BY MOUTH EVERY 4  HOURS AS NEEDED FOR WHEEZING   6.7 g   2   . omeprazole (PRILOSEC) 40 MG capsule   Oral   Take 1 capsule (40 mg total) by mouth daily.          Triage Vitals: BP 128/84  Pulse 78  Temp(Src) 98.4 F (36.9 C) (Oral)  Resp 20  Ht 5' 6.5" (1.689 m)  Wt 219 lb (99.338 kg)  BMI 34.82 kg/m2  SpO2 99%  LMP 10/21/2013 Physical Exam  Nursing note and vitals reviewed. Constitutional: She is oriented to person, place, and time. She appears well-developed and well-nourished. No distress.  HENT:  Head: Normocephalic and atraumatic.  1cm laceration to the posterior right ear at the level of the proximal earlobe 3/4 way down; Bleeding controlled   Eyes: EOM are normal.  Neck: Neck supple. No tracheal deviation present.  Cardiovascular: Normal rate.   Pulmonary/Chest: Effort normal. No respiratory distress.  Musculoskeletal: Normal range of motion.  Neurological: She is alert and oriented to person, place, and time.  Skin: Skin is warm and dry.  Psychiatric: She has a normal mood and affect. Her behavior is normal.    ED Course  Procedures (including critical care time) DIAGNOSTIC STUDIES: Oxygen Saturation is 99% on RA, normal by my interpretation.    COORDINATION OF CARE:  9:49 PM-Discussed treatment plan which includes antibiotics with pt at bedside and pt agreed to plan.   Labs Review Labs Reviewed - No data to display Imaging Review No results found.   EKG Interpretation None      MDM   Final diagnoses:  Open bite of ear    56F here with small ear lac from puppy bite. Her puppy did it, rabies vaccinations UTD. Bleeding controlled here. No thru-and-thru laceration, small, 1 cm. Will irrigate and leave open as it's an animal  bite injury. On posterior ear, little to no cosmetic implications. Put on augmentin. Stable for discharge.  I personally performed the services described in this documentation, which was scribed in my presence. The recorded information has been reviewed and is accurate.     Osvaldo Shipper, MD 10/21/13 [phone]

## 2013-10-21 NOTE — ED Notes (Signed)
She was playing with her puppy and he bit her on the right ear. Bleeding controlled. Rabies are UTD.

## 2014-05-08 ENCOUNTER — Emergency Department (HOSPITAL_BASED_OUTPATIENT_CLINIC_OR_DEPARTMENT_OTHER): Payer: 59

## 2014-05-08 ENCOUNTER — Emergency Department (HOSPITAL_BASED_OUTPATIENT_CLINIC_OR_DEPARTMENT_OTHER)
Admission: EM | Admit: 2014-05-08 | Discharge: 2014-05-08 | Disposition: A | Discharge status: Home / Self Care | Payer: 59 | Attending: Emergency Medicine | Admitting: Emergency Medicine

## 2014-05-08 ENCOUNTER — Encounter (HOSPITAL_BASED_OUTPATIENT_CLINIC_OR_DEPARTMENT_OTHER): Payer: Self-pay | Admitting: Emergency Medicine

## 2014-05-08 DIAGNOSIS — Z8619 Personal history of other infectious and parasitic diseases: Secondary | ICD-10-CM | POA: Diagnosis not present

## 2014-05-08 DIAGNOSIS — Z86010 Personal history of colonic polyps: Secondary | ICD-10-CM | POA: Diagnosis not present

## 2014-05-08 DIAGNOSIS — R1032 Left lower quadrant pain: Secondary | ICD-10-CM | POA: Diagnosis not present

## 2014-05-08 DIAGNOSIS — Z79899 Other long term (current) drug therapy: Secondary | ICD-10-CM | POA: Diagnosis not present

## 2014-05-08 DIAGNOSIS — Z720 Tobacco use: Secondary | ICD-10-CM | POA: Diagnosis not present

## 2014-05-08 DIAGNOSIS — Z862 Personal history of diseases of the blood and blood-forming organs and certain disorders involving the immune mechanism: Secondary | ICD-10-CM | POA: Diagnosis not present

## 2014-05-08 DIAGNOSIS — K219 Gastro-esophageal reflux disease without esophagitis: Secondary | ICD-10-CM | POA: Insufficient documentation

## 2014-05-08 DIAGNOSIS — Z8742 Personal history of other diseases of the female genital tract: Secondary | ICD-10-CM | POA: Diagnosis not present

## 2014-05-08 DIAGNOSIS — R1031 Right lower quadrant pain: Secondary | ICD-10-CM | POA: Insufficient documentation

## 2014-05-08 DIAGNOSIS — Z792 Long term (current) use of antibiotics: Secondary | ICD-10-CM | POA: Diagnosis not present

## 2014-05-08 DIAGNOSIS — Z3202 Encounter for pregnancy test, result negative: Secondary | ICD-10-CM | POA: Insufficient documentation

## 2014-05-08 DIAGNOSIS — J45909 Unspecified asthma, uncomplicated: Secondary | ICD-10-CM | POA: Insufficient documentation

## 2014-05-08 LAB — BASIC METABOLIC PANEL
Anion gap: 12 (ref 5–15)
BUN: 7 mg/dL (ref 6–23)
CALCIUM: 9.6 mg/dL (ref 8.4–10.5)
CO2: 25 mEq/L (ref 19–32)
Chloride: 104 mEq/L (ref 96–112)
Creatinine, Ser: 0.5 mg/dL (ref 0.50–1.10)
GFR calc Af Amer: >90 mL/min (ref >90)
GLUCOSE: 95 mg/dL (ref 70–99)
Potassium: 4.1 mEq/L (ref 3.7–5.3)
SODIUM: 141 meq/L (ref 137–147)

## 2014-05-08 LAB — URINALYSIS, ROUTINE W REFLEX MICROSCOPIC
Bilirubin Urine: NEGATIVE
Glucose, UA: NEGATIVE mg/dL
Hgb urine dipstick: NEGATIVE
KETONES UR: NEGATIVE mg/dL
LEUKOCYTES UA: NEGATIVE
Nitrite: NEGATIVE
PROTEIN: NEGATIVE mg/dL
Specific Gravity, Urine: 1.018 (ref 1.005–1.030)
UROBILINOGEN UA: 0.2 mg/dL (ref 0.0–1.0)
pH: 8 (ref 5.0–8.0)

## 2014-05-08 LAB — CBC WITH DIFFERENTIAL/PLATELET
BASOS ABS: 0 10*3/uL (ref 0.0–0.1)
BASOS PCT: 0 % (ref 0–1)
EOS ABS: 0.3 10*3/uL (ref 0.0–0.7)
Eosinophils Relative: 4 % (ref 0–5)
HCT: 37.3 % (ref 36.0–46.0)
Hemoglobin: 12.6 g/dL (ref 12.0–15.0)
Lymphocytes Relative: 28 % (ref 12–46)
Lymphs Abs: 2.1 10*3/uL (ref 0.7–4.0)
MCH: 31 pg (ref 26.0–34.0)
MCHC: 33.8 g/dL (ref 30.0–36.0)
MCV: 91.6 fL (ref 78.0–100.0)
MONO ABS: 0.5 10*3/uL (ref 0.1–1.0)
Monocytes Relative: 7 % (ref 3–12)
Neutro Abs: 4.6 10*3/uL (ref 1.7–7.7)
Neutrophils Relative %: 61 % (ref 43–77)
Platelets: 199 10*3/uL (ref 150–400)
RBC: 4.07 MIL/uL (ref 3.87–5.11)
RDW: 12.5 % (ref 11.5–15.5)
WBC: 7.5 10*3/uL (ref 4.0–10.5)

## 2014-05-08 LAB — PREGNANCY, URINE: Preg Test, Ur: NEGATIVE

## 2014-05-08 MED ORDER — HYDROMORPHONE HCL 1 MG/ML IJ SOLN
0.5000 mg | Freq: Once | INTRAMUSCULAR | Status: AC
  Administered 2014-05-08: 0.5 mg via INTRAVENOUS
  Filled 2014-05-08: qty 1

## 2014-05-08 MED ORDER — IOHEXOL 300 MG/ML  SOLN
100.0000 mL | Freq: Once | INTRAMUSCULAR | Status: AC | PRN
  Administered 2014-05-08: 100 mL via INTRAVENOUS

## 2014-05-08 MED ORDER — ONDANSETRON HCL 4 MG/2ML IJ SOLN
4.0000 mg | Freq: Once | INTRAMUSCULAR | Status: AC
  Administered 2014-05-08: 4 mg via INTRAVENOUS
  Filled 2014-05-08: qty 2

## 2014-05-08 MED ORDER — OXYCODONE-ACETAMINOPHEN 5-325 MG PO TABS: 1.0000 | ORAL_TABLET | Freq: Four times a day (QID) | ORAL | Status: DC | PRN

## 2014-05-08 MED ORDER — IOHEXOL 300 MG/ML  SOLN
25.0000 mL | Freq: Once | INTRAMUSCULAR | Status: AC | PRN
  Administered 2014-05-08: 25 mL via ORAL

## 2014-05-08 MED ORDER — DICYCLOMINE HCL 20 MG PO TABS: 20.0000 mg | ORAL_TABLET | Freq: Two times a day (BID) | ORAL | Status: DC

## 2014-05-08 MED ORDER — IBUPROFEN 800 MG PO TABS: 800.0000 mg | ORAL_TABLET | Freq: Three times a day (TID) | ORAL | Status: DC

## 2014-05-08 MED ORDER — SODIUM CHLORIDE 0.9 % IV BOLUS (SEPSIS)
1000.0000 mL | Freq: Once | INTRAVENOUS | Status: AC
  Administered 2014-05-08: 1000 mL via INTRAVENOUS

## 2014-05-08 MED ORDER — MORPHINE SULFATE 4 MG/ML IJ SOLN
4.0000 mg | Freq: Once | INTRAMUSCULAR | Status: AC
  Administered 2014-05-08: 4 mg via INTRAVENOUS
  Filled 2014-05-08: qty 1

## 2014-05-08 NOTE — ED Notes (Signed)
Pt presents to ED with complaints of RLQ pain for the past month. But worse today. Pt went to her ob/gyn Tuesday.

## 2014-05-08 NOTE — Discharge Instructions (Signed)
Call for a follow up appointment with a Family or Primary Care Provider.  Return if symptoms worsen, you developed nausea, vomiting, diarrhea, fever, chills.   Take medication as prescribed.

## 2014-05-08 NOTE — ED Provider Notes (Signed)
CSN: [id number]     Arrival date & time 05/08/14  1314 History   First MD Initiated Contact with Patient 05/08/14 1330     Chief Complaint  Patient presents with  . Abdominal Pain     (Consider location/radiation/quality/duration/timing/severity/associated sxs/prior Treatment) HPI Comments: The patient is a 28 year old female past medical history of reflux, ovarian cyst, presenting to the emergency room chief complaint of worsening right lower quadrant pain.  Patient reports dull cramping pain for the last several weeks. Reports an increase in intensity today. Discomfort worsened with movement. Last bowel movement today, normal. Denies nausea or vomiting. Patient reports recently seen by OB/GYN 5 days ago for similar complaints, negative pregnancy, negative UA, negative pelvic ultrasound, negative STD panel. Denies history of kidney stones. LNMP: 04/21/2014  The history is provided by the patient. No language interpreter was used.    Past Medical History  Diagnosis Date  . GERD (gastroesophageal reflux disease)   . Colon polyps   . Allergy-induced asthma   . Ovarian cyst   . 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 Cesarean section     Family History  Problem Relation Age of Onset  . Hyperlipidemia Mother   . Crohn's disease Mother   . Colon polyps Mother   . Hyperlipidemia Father   . Hemophilia Father   . Hemophilia Son    History  Substance Use Topics  . Smoking status: Light Tobacco Smoker -- 0.50 packs/day    Types: Cigarettes  . Smokeless tobacco: Not on file  . Alcohol Use: No     Comment: social   OB History   Grav Para Term Preterm Abortions TAB SAB Ect Mult Living   2 2 2       2      Review of Systems  Constitutional: Negative for fever, chills and appetite change.  Gastrointestinal: Positive for abdominal pain. Negative for diarrhea and constipation.  Genitourinary: Negative for dysuria, vaginal bleeding,  vaginal discharge, menstrual problem and pelvic pain.      Allergies  Vicodin  Home Medications   Prior to Admission medications   Medication Sig Start Date End Date Taking? Authorizing Provider  albuterol (PROVENTIL HFA) 108 (90 BASE) MCG/ACT inhaler INHALE TWO PUFFS BY MOUTH EVERY 4  HOURS AS NEEDED FOR WHEEZING 08/03/13   Abner Greenspan, MD  amoxicillin-clavulanate (AUGMENTIN) 875-125 MG per tablet Take 1 tablet by mouth 2 (two) times daily. One po bid x 7 days 10/21/13   Evelina Bucy, MD  omeprazole (PRILOSEC) 40 MG capsule Take 1 capsule (40 mg total) by mouth daily. 01/12/13   Lucille Passy, MD   BP 128/76  Pulse 87  Temp(Src) 97.7 F (36.5 C) (Oral)  Resp 18  Ht 5' 6.75" (1.695 m)  Wt 219 lb (99.338 kg)  BMI 34.58 kg/m2  SpO2 100%  LMP 04/21/2014 Physical Exam  Nursing note and vitals reviewed. Constitutional: She is oriented to person, place, and time. She appears well-developed and well-nourished.  Non-toxic appearance. She does not have a sickly appearance. She does not appear ill. No distress.  HENT:  Head: Normocephalic and atraumatic.  Neck: Neck supple.  Cardiovascular: Normal rate and regular rhythm.   Pulmonary/Chest: Effort normal. She has no wheezes. She has no rales.  Abdominal: Soft. She exhibits no distension. There is tenderness in the right lower quadrant and left lower quadrant. There is no rigidity and no guarding.    Neurological: She is alert  and oriented to person, place, and time.  Skin: She is not diaphoretic.    ED Course  Procedures (including critical care time) Labs Review Labs Reviewed  URINALYSIS, ROUTINE W REFLEX MICROSCOPIC - Abnormal; Notable for the following:    APPearance CLOUDY (*)    All other components within normal limits  PREGNANCY, URINE  CBC WITH DIFFERENTIAL  BASIC METABOLIC PANEL    Imaging Review Ct Abdomen Pelvis W Contrast  05/08/2014   CLINICAL DATA:  Right lower quadrant abdominal pain for 1 month with some  diarrhea.  EXAM: CT ABDOMEN AND PELVIS WITH CONTRAST  TECHNIQUE: Multidetector CT imaging of the abdomen and pelvis was performed using the standard protocol following bolus administration of intravenous contrast.  CONTRAST:  137mL OMNIPAQUE IOHEXOL 300 MG/ML SOLN, 20mL OMNIPAQUE IOHEXOL 300 MG/ML SOLN, 6mL OMNIPAQUE IOHEXOL 300 MG/ML SOLN  COMPARISON:  CT abdomen 10/01/2012  FINDINGS: The imaged lung bases are clear.  Heart size is normal.  The liver, spleen, gallbladder, adrenal glands, pancreas, and kidneys are within normal limits. Stomach is well distended with oral contrast appears normal. Small bowel loops are normal in caliber and demonstrate wall normal wall thickness. Bowel is not opacified the level of the left lower quadrant ileum. The terminal ileum appears normal. Normal appendix. Scattered colonic diverticulosis is noted, without evidence of acute associated inflammatory change. Urinary bladder is normal. Ureters are normal in caliber.  Normal appearance of the uterus and ovaries for premenopausal patient, with probable dominant follicle in the right ovary.  Negative for ascites, lymphadenopathy, or free air. The imaged bones are unremarkable.  IMPRESSION: No acute findings in the abdomen or pelvis. No explanation for patient's pain identified.   Electronically Signed   By: Curlene Dolphin M.D.   On: 05/08/2014 15:04     EKG Interpretation None      MDM   Final diagnoses:  Right lower quadrant pain   Patient presents with right lower quadrant tenderness, recently evaluated by a gynecologist with significant workup for negative ultrasound, STD, UA. Plan to CT, treat symptomatically. Labs unremarkable negative UA, negative pregnancy. CT without acute findings, right sided with probable dominant follicle of the right ovary likely origin of pain.  Re-eval patient resting comfortably in room, reports mild resolution of symptoms. Advised patient to follow-up with GI specialist, OB  gynecologist as needed. Discussed lab results, imaging results, and treatment plan with the patient. Return precautions given. Reports understanding and no other concerns at this time.  Patient is stable for discharge at this time. Meds given in ED:  Medications  sodium chloride 0.9 % bolus 1,000 mL (0 mLs Intravenous Stopped 05/08/14 1502)  ondansetron (ZOFRAN) injection 4 mg (4 mg Intravenous Given 05/08/14 1401)  morphine 4 MG/ML injection 4 mg (4 mg Intravenous Given 05/08/14 1401)  iohexol (OMNIPAQUE) 300 MG/ML solution 25 mL (25 mLs Oral Contrast Given 05/08/14 1444)  iohexol (OMNIPAQUE) 300 MG/ML solution 25 mL (25 mLs Oral Contrast Given 05/08/14 1444)  iohexol (OMNIPAQUE) 300 MG/ML solution 100 mL (100 mLs Intravenous Contrast Given 05/08/14 1444)  HYDROmorphone (DILAUDID) injection 0.5 mg (0.5 mg Intravenous Given 05/08/14 1508)    New Prescriptions   DICYCLOMINE (BENTYL) 20 MG TABLET    Take 1 tablet (20 mg total) by mouth 2 (two) times daily.   IBUPROFEN (ADVIL,MOTRIN) 800 MG TABLET    Take 1 tablet (800 mg total) by mouth 3 (three) times daily.   OXYCODONE-ACETAMINOPHEN (PERCOCET) 5-325 MG PER TABLET    Take 1 tablet by  mouth every 6 (six) hours as needed.       Harvie Heck, PA-C 05/08/14 2350

## 2014-05-09 ENCOUNTER — Encounter: Payer: Self-pay | Admitting: Internal Medicine

## 2014-05-09 ENCOUNTER — Encounter: Payer: Self-pay | Admitting: *Deleted

## 2014-05-09 ENCOUNTER — Ambulatory Visit (INDEPENDENT_AMBULATORY_CARE_PROVIDER_SITE_OTHER): Payer: 59 | Admitting: Internal Medicine

## 2014-05-09 VITALS — BP 110/70 | HR 98 | Temp 98.3°F | Wt 224.0 lb

## 2014-05-09 DIAGNOSIS — R1031 Right lower quadrant pain: Secondary | ICD-10-CM

## 2014-05-09 MED ORDER — OMEPRAZOLE 40 MG PO CPDR: 40.0000 mg | DELAYED_RELEASE_CAPSULE | Freq: Every day | ORAL | Status: DC

## 2014-05-09 MED ORDER — ALBUTEROL SULFATE HFA 108 (90 BASE) MCG/ACT IN AERS: INHALATION_SPRAY | RESPIRATORY_TRACT | Status: DC

## 2014-05-09 MED ORDER — ONDANSETRON HCL 4 MG PO TABS: 4.0000 mg | ORAL_TABLET | Freq: Three times a day (TID) | ORAL | Status: DC | PRN

## 2014-05-09 NOTE — Progress Notes (Signed)
Subjective:    Patient ID: Michelle Kane, female    DOB: 10/19/1985, 28 y.o.   MRN: [id number]  HPI Here with mom and mom's friend Anderson Malta  For over a month, she has been having low abdominal pain (RLQ and up flank) Feels like menstrual cramps but persistent. 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 albuterol (PROVENTIL HFA) 108 (90 BASE) MCG/ACT inhaler INHALE TWO PUFFS BY MOUTH EVERY 4  HOURS AS NEEDED FOR WHEEZING  6.7 g  2  . dicyclomine (BENTYL) 20 MG tablet Take 1 tablet (20 mg total) by mouth 2 (two) times daily.  20 tablet  0  . ibuprofen (ADVIL,MOTRIN) 800 MG tablet Take 1 tablet (800 mg total) by mouth 3 (three) times daily.  21 tablet  0  . omeprazole (PRILOSEC) 40 MG capsule Take 1 capsule (40 mg total) by mouth daily.      Marland Kitchen oxyCODONE-acetaminophen (PERCOCET) 5-325 MG per tablet Take 1 tablet by mouth every 6 (six) hours as needed.  10 tablet  0   No current facility-administered medications on file prior to visit.    Allergies  Allergen Reactions  . Vicodin [Hydrocodone-Acetaminophen] Itching and Nausea And Vomiting    Past Medical History  Diagnosis Date  . GERD (gastroesophageal reflux disease)   . Colon polyps   . Allergy-induced asthma   . Ovarian cyst     . 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 Cesarean section      Family History  Problem Relation Age of Onset  . Hyperlipidemia Mother   . Crohn's disease Mother   . Colon polyps Mother   . Hyperlipidemia Father   . Hemophilia Father   . Hemophilia Son     History   Social History  . Marital Status: Single    Spouse Name: N/A    Number of Children: N/A  . Years of Education: N/A   Occupational History  . Not on file.   Social History Main Topics  . Smoking status: Light Tobacco Smoker -- 0.50 packs/day    Types: Cigarettes  . Smokeless tobacco: Never Used  . Alcohol Use: No     Comment: social  . Drug Use: No  . Sexual Activity: Yes   Other Topics Concern  . Not on file   Social History Narrative  . No narrative on file   Review of Systems Unable to tolerate OCP for ovarian cyst---bleeds too often Stable relationship and they use condoms No problems voiding Ovulated yesterday --she tracks for safety Weight stable    Objective:   Physical Exam  Constitutional: She appears well-developed and well-nourished. No distress.  Neck: Normal range of motion. Neck supple. No thyromegaly present.  Cardiovascular:  Normal rate, regular rhythm and normal heart sounds.  Exam reveals no gallop.   No murmur heard. Pulmonary/Chest: Effort normal and breath sounds normal. No respiratory distress. She has no wheezes. She has no rales.  Abdominal: Soft. Bowel sounds are normal. She exhibits no distension. There is no rebound and no guarding.  Mild suprapubic tenderness RLQ actually fairly benign  Lymphadenopathy:    She has no cervical adenopathy.  Psychiatric: She has a normal mood and affect. Her behavior is normal.          Assessment & Plan:

## 2014-05-09 NOTE — ED Provider Notes (Signed)
Medical screening examination/treatment/procedure(s) were performed by non-physician practitioner and as supervising physician I was immediately available for consultation/collaboration.   EKG Interpretation None        Malvin Johns, MD 05/09/14 [phone]

## 2014-05-09 NOTE — Assessment & Plan Note (Signed)
Reviewed history and ER visit and abdominal CT scan No clear diagnosis. Doesn't seem intestinal No urinary symptoms Most likely ovarian but full eval by gyn recently Failed OCP/mirena/implants even if this is ovarian  Uses muscle relaxer/ibuprofen Avoiding the oxycodone Will fill ondansetron to help her restart eating She will discuss with Dr Helane Rima

## 2014-05-16 ENCOUNTER — Encounter: Payer: Self-pay | Admitting: Internal Medicine

## 2014-05-17 ENCOUNTER — Ambulatory Visit (INDEPENDENT_AMBULATORY_CARE_PROVIDER_SITE_OTHER): Payer: 59 | Admitting: Family Medicine

## 2014-05-17 ENCOUNTER — Encounter: Payer: Self-pay | Admitting: Family Medicine

## 2014-05-17 VITALS — BP 112/64 | HR 85 | Temp 98.0°F | Ht 66.75 in | Wt 227.0 lb

## 2014-05-17 DIAGNOSIS — E669 Obesity, unspecified: Secondary | ICD-10-CM

## 2014-05-17 DIAGNOSIS — Z0000 Encounter for general adult medical examination without abnormal findings: Secondary | ICD-10-CM

## 2014-05-17 DIAGNOSIS — K219 Gastro-esophageal reflux disease without esophagitis: Secondary | ICD-10-CM

## 2014-05-17 DIAGNOSIS — K760 Fatty (change of) liver, not elsewhere classified: Secondary | ICD-10-CM

## 2014-05-17 DIAGNOSIS — Z01419 Encounter for gynecological examination (general) (routine) without abnormal findings: Secondary | ICD-10-CM | POA: Insufficient documentation

## 2014-05-17 LAB — LIPID PANEL
CHOL/HDL RATIO: 5
Cholesterol: 197 mg/dL (ref 0–200)
HDL: 37.6 mg/dL — AB (ref >39.00)
LDL Cholesterol: 124 mg/dL — ABNORMAL HIGH (ref 0–99)
NONHDL: 159.4
Triglycerides: 175 mg/dL — ABNORMAL HIGH (ref 0.0–149.0)
VLDL: 35 mg/dL (ref 0.0–40.0)

## 2014-05-17 LAB — TSH: TSH: 0.88 u[IU]/mL (ref 0.35–4.50)

## 2014-05-17 LAB — COMPREHENSIVE METABOLIC PANEL
ALK PHOS: 43 U/L (ref 39–117)
ALT: 18 U/L (ref 0–35)
AST: 17 U/L (ref 0–37)
Albumin: 3.2 g/dL — ABNORMAL LOW (ref 3.5–5.2)
BILIRUBIN TOTAL: 0.3 mg/dL (ref 0.2–1.2)
BUN: 9 mg/dL (ref 6–23)
CO2: 26 mEq/L (ref 19–32)
Calcium: 8.9 mg/dL (ref 8.4–10.5)
Chloride: 106 mEq/L (ref 96–112)
Creatinine, Ser: 0.7 mg/dL (ref 0.4–1.2)
GFR: 115.19 mL/min (ref >60.00)
Glucose, Bld: 80 mg/dL (ref 70–99)
Potassium: 3.7 mEq/L (ref 3.5–5.1)
SODIUM: 137 meq/L (ref 135–145)
TOTAL PROTEIN: 6.9 g/dL (ref 6.0–8.3)

## 2014-05-17 NOTE — Assessment & Plan Note (Signed)
Reviewed preventive care protocols, scheduled due services, and updated immunizations Discussed nutrition, exercise, diet, and healthy lifestyle.  Orders Placed This Encounter  Procedures  . Comprehensive metabolic panel  . Lipid panel  . TSH    

## 2014-05-17 NOTE — Patient Instructions (Signed)
DASH Eating Plan °DASH stands for "Dietary Approaches to Stop Hypertension." The DASH eating plan is a healthy eating plan that has been shown to reduce high blood pressure (hypertension). Additional health benefits may include reducing the risk of type 2 diabetes mellitus, heart disease, and stroke. The DASH eating plan may also help with weight loss. °WHAT DO I NEED TO KNOW ABOUT THE DASH EATING PLAN? °For the DASH eating plan, you will follow these general guidelines: °· Choose foods with a percent daily value for sodium of less than 5% (as listed on the food label). °· Use salt-free seasonings or herbs instead of table salt or sea salt. °· Check with your health care provider or pharmacist before using salt substitutes. °· Eat lower-sodium products, often labeled as "lower sodium" or "no salt added." °· Eat fresh foods. °· Eat more vegetables, fruits, and low-fat dairy products. °· Choose whole grains. Look for the word "whole" as the first word in the ingredient list. °· Choose fish and skinless chicken or turkey more often than red meat. Limit fish, poultry, and meat to 6 oz (170 g) each day. °· Limit sweets, desserts, sugars, and sugary drinks. °· Choose heart-healthy fats. °· Limit cheese to 1 oz (28 g) per day. °· Eat more home-cooked food and less restaurant, buffet, and fast food. °· Limit fried foods. °· Cook foods using methods other than frying. °· Limit canned vegetables. If you do use them, rinse them well to decrease the sodium. °· When eating at a restaurant, ask that your food be prepared with less salt, or no salt if possible. °WHAT FOODS CAN I EAT? °Seek help from a dietitian for individual calorie needs. °Grains °Whole grain or whole wheat bread. Brown rice. Whole grain or whole wheat pasta. Quinoa, bulgur, and whole grain cereals. Low-sodium cereals. Corn or whole wheat flour tortillas. Whole grain cornbread. Whole grain crackers. Low-sodium crackers. °Vegetables °Fresh or frozen vegetables  (raw, steamed, roasted, or grilled). Low-sodium or reduced-sodium tomato and vegetable juices. Low-sodium or reduced-sodium tomato sauce and paste. Low-sodium or reduced-sodium canned vegetables.  °Fruits °All fresh, canned (in natural juice), or frozen fruits. °Meat and Other Protein Products °Ground beef (85% or leaner), grass-fed beef, or beef trimmed of fat. Skinless chicken or turkey. Ground chicken or turkey. Pork trimmed of fat. All fish and seafood. Eggs. Dried beans, peas, or lentils. Unsalted nuts and seeds. Unsalted canned beans. °Dairy °Low-fat dairy products, such as skim or 1% milk, 2% or reduced-fat cheeses, low-fat ricotta or cottage cheese, or plain low-fat yogurt. Low-sodium or reduced-sodium cheeses. °Fats and Oils °Tub margarines without trans fats. Light or reduced-fat mayonnaise and salad dressings (reduced sodium). Avocado. Safflower, olive, or canola oils. Natural peanut or almond butter. °Other °Unsalted popcorn and pretzels. °The items listed above may not be a complete list of recommended foods or beverages. Contact your dietitian for more options. °WHAT FOODS ARE NOT RECOMMENDED? °Grains °White bread. White pasta. White rice. Refined cornbread. Bagels and croissants. Crackers that contain trans fat. °Vegetables °Creamed or fried vegetables. Vegetables in a cheese sauce. Regular canned vegetables. Regular canned tomato sauce and paste. Regular tomato and vegetable juices. °Fruits °Dried fruits. Canned fruit in light or heavy syrup. Fruit juice. °Meat and Other Protein Products °Fatty cuts of meat. Ribs, chicken wings, bacon, sausage, bologna, salami, chitterlings, fatback, hot dogs, bratwurst, and packaged luncheon meats. Salted nuts and seeds. Canned beans with salt. °Dairy °Whole or 2% milk, cream, half-and-half, and cream cheese. Whole-fat or sweetened yogurt. Full-fat   cheeses or blue cheese. Nondairy creamers and whipped toppings. Processed cheese, cheese spreads, or cheese  curds. °Condiments °Onion and garlic salt, seasoned salt, table salt, and sea salt. Canned and packaged gravies. Worcestershire sauce. Tartar sauce. Barbecue sauce. Teriyaki sauce. Soy sauce, including reduced sodium. Steak sauce. Fish sauce. Oyster sauce. Cocktail sauce. Horseradish. Ketchup and mustard. Meat flavorings and tenderizers. Bouillon cubes. Hot sauce. Tabasco sauce. Marinades. Taco seasonings. Relishes. °Fats and Oils °Butter, stick margarine, lard, shortening, ghee, and bacon fat. Coconut, palm kernel, or palm oils. Regular salad dressings. °Other °Pickles and olives. Salted popcorn and pretzels. °The items listed above may not be a complete list of foods and beverages to avoid. Contact your dietitian for more information. °WHERE CAN I FIND MORE INFORMATION? °National Heart, [employer], and [employer] Institute: www.nhlbi.nih.gov/health/health-topics/topics/dash/ °Document Released: 06/20/2011 Document Revised: 11/15/2013 Document Reviewed: 05/05/2013 °[employer]® Patient Information ©2015 [employer]. This information is not intended to replace advice given to you by your health care provider. Make sure you discuss any questions you have with your health care provider. ° °

## 2014-05-17 NOTE — Progress Notes (Signed)
Patient ID: Michelle Kane, female   DOB: [birth year], 28 y.o.   MRN: [id number]   Subjective:   Patient ID: Michelle Kane, female    DOB: 25-Jul-1985, 28 y.o.   MRN: [id number]  Michelle Kane is a pleasant 28 y.o. year old female who presents to clinic today with Annual Exam  on 05/17/2014  HPI: Has been doing ok- was having pelvic pain end of last month.  Went to ER-CT neg. Followed up with Dr. Silvio Pate here on 10/26-note reviewed. Saw Dr. Helane Rima for this complaint-she felt it was "round ligament pain"- awaiting records. Symptoms have nearly resolved.  Follows up with Dr. Helane Rima every 6 months given h/o cervical dysplasia.  Fatty liver- has not been working on weight loss, unfortunately has gained weight. Wt Readings from Last 3 Encounters:  05/17/14 227 lb (102.967 kg)  05/09/14 224 lb (101.606 kg)  05/08/14 219 lb (99.338 kg)   Lab Results  Component Value Date   ALT 15 03/06/2013   AST 16 03/06/2013   ALKPHOS 46 03/06/2013   BILITOT 0.4 03/06/2013   BP has been stable without rx. Feels it fluctuates with changes in her diet.  Moved in with her boyfriend 3 months ago.  Things are going well.  Current Outpatient Prescriptions on File Prior to Visit  Medication Sig Dispense Refill  . albuterol (PROVENTIL HFA) 108 (90 BASE) MCG/ACT inhaler INHALE TWO PUFFS BY MOUTH EVERY 4  HOURS AS NEEDED FOR WHEEZING 6.7 g 2  . ibuprofen (ADVIL,MOTRIN) 800 MG tablet Take 1 tablet (800 mg total) by mouth 3 (three) times daily. 21 tablet 0  . omeprazole (PRILOSEC) 40 MG capsule Take 1 capsule (40 mg total) by mouth daily. 30 capsule 1   No current facility-administered medications on file prior to visit.    Allergies  Allergen Reactions  . Vicodin [Hydrocodone-Acetaminophen] Itching and Nausea And Vomiting    Past Medical History  Diagnosis Date  . GERD (gastroesophageal reflux disease)   . Colon polyps   . Allergy-induced asthma   . Ovarian cyst   . 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 Cesarean section      Family History  Problem Relation Age of Onset  . Hyperlipidemia Mother   . Crohn's disease Mother   . Colon polyps Mother   . Hyperlipidemia Father   . Hemophilia Father   . Hemophilia Son     History   Social History  . Marital Status: Single    Spouse Name: N/A    Number of Children: N/A  . Years of Education: N/A   Occupational History  . Not on file.   Social History Main Topics  . Smoking status: Light Tobacco Smoker -- 0.50 packs/day    Types: Cigarettes  . Smokeless tobacco: Never Used  . Alcohol Use: No     Comment: social  . Drug Use: No  . Sexual Activity: Yes   Other Topics Concern  . Not on file   Social History Narrative   The PMH, PSH, Social History, Family History, Medications, and allergies have been reviewed in [facility], and have been updated if relevant.    Review of Systems  Constitutional: Negative.   HENT: Negative.   Eyes: Negative.   Respiratory: Negative.   Cardiovascular: Negative.   Gastrointestinal: Positive for abdominal pain. Negative for nausea, vomiting, diarrhea, constipation, blood in stool, anal bleeding and rectal pain.  Genitourinary: Negative.  Hematological: Negative.   Psychiatric/Behavioral: Negative.   All other systems reviewed and are negative.      Objective:    BP 112/64 mmHg  Pulse 85  Temp(Src) 98 F (36.7 C) (Oral)  Ht 5' 6.75" (1.695 m)  Wt 227 lb (102.967 kg)  BMI 35.84 kg/m2  SpO2 98%  LMP 04/21/2014   Physical Exam  Constitutional: She is oriented to person, place, and time. She appears well-developed and well-nourished. No distress.  HENT:  Head: Normocephalic and atraumatic.  Right Ear: External ear normal.  Left Ear: External ear normal.  Nose: Nose normal.  Mouth/Throat: No oropharyngeal exudate.  Eyes: Pupils are equal, round, and reactive to light.  Neck: Normal range of motion. Neck supple.   Cardiovascular: Normal rate, regular rhythm and normal heart sounds.   Pulmonary/Chest: Effort normal and breath sounds normal. No respiratory distress. She has no wheezes. She has no rales.  Abdominal: Soft. Bowel sounds are normal. She exhibits no distension. There is no tenderness. There is no rebound and no guarding.  Musculoskeletal: Normal range of motion.  Neurological: She is alert and oriented to person, place, and time. She has normal reflexes. No cranial nerve deficit.  Skin: Skin is warm and dry.  Psychiatric: She has a normal mood and affect. Her behavior is normal. Judgment and thought content normal.  Nursing note and vitals reviewed.         Assessment & Plan:   Well woman exam  Obesity  Gastroesophageal reflux disease, esophagitis presence not specified No Follow-up on file.

## 2014-05-17 NOTE — Progress Notes (Signed)
Pre visit review using our clinic review tool, if applicable. No additional management support is needed unless otherwise documented below in the visit note. 

## 2014-05-17 NOTE — Assessment & Plan Note (Signed)
Follow-up labs today

## 2014-06-17 ENCOUNTER — Other Ambulatory Visit: Payer: Self-pay | Admitting: Obstetrics and Gynecology

## 2014-06-20 LAB — CYTOLOGY - PAP

## 2014-07-26 ENCOUNTER — Encounter (HOSPITAL_BASED_OUTPATIENT_CLINIC_OR_DEPARTMENT_OTHER): Payer: Self-pay | Admitting: *Deleted

## 2014-07-26 NOTE — Progress Notes (Signed)
NPO AFTER MN. ARRIVE AT 0600. NEEDS HG AND URINE PREG.  PRE-OP ORDERS PENDING. 

## 2014-07-28 ENCOUNTER — Encounter (HOSPITAL_BASED_OUTPATIENT_CLINIC_OR_DEPARTMENT_OTHER): Payer: Self-pay | Admitting: Anesthesiology

## 2014-07-28 NOTE — Anesthesia Preprocedure Evaluation (Signed)
Anesthesia Evaluation  Patient identified by MRN, date of birth, ID band Patient awake    Reviewed: Allergy & Precautions, NPO status , Patient's Chart, lab work & pertinent test results  Airway Mallampati: II  TM Distance: >3 FB Neck ROM: Full    Dental no notable dental hx.    Pulmonary asthma , Current Smoker,  breath sounds clear to auscultation  Pulmonary exam normal       Cardiovascular negative cardio ROS  Rhythm:Regular Rate:Normal     Neuro/Psych negative neurological ROS  negative psych ROS   GI/Hepatic Neg liver ROS, GERD-  Medicated,  Endo/Other  negative endocrine ROS  Renal/GU negative Renal ROS  negative genitourinary   Musculoskeletal negative musculoskeletal ROS (+)   Abdominal (+) + obese,   Peds negative pediatric ROS (+)  Hematology negative hematology ROS (+)   Anesthesia Other Findings   Reproductive/Obstetrics negative OB ROS                             Anesthesia Physical Anesthesia Plan  ASA: II  Anesthesia Plan: General   Post-op Pain Management:    Induction: Intravenous  Airway Management Planned: Oral ETT  Additional Equipment:   Intra-op Plan:   Post-operative Plan: Extubation in OR  Informed Consent: I have reviewed the patients History and Physical, chart, labs and discussed the procedure including the risks, benefits and alternatives for the proposed anesthesia with the patient or authorized representative who has indicated his/her understanding and acceptance.   Dental advisory given  Plan Discussed with: CRNA  Anesthesia Plan Comments:         Anesthesia Quick Evaluation

## 2014-07-29 ENCOUNTER — Ambulatory Visit (HOSPITAL_BASED_OUTPATIENT_CLINIC_OR_DEPARTMENT_OTHER): Payer: 59 | Admitting: Anesthesiology

## 2014-07-29 ENCOUNTER — Encounter (HOSPITAL_BASED_OUTPATIENT_CLINIC_OR_DEPARTMENT_OTHER)
Admission: RE | Disposition: A | Discharge status: Home / Self Care | Payer: Self-pay | Source: Ambulatory Visit | Attending: Obstetrics and Gynecology

## 2014-07-29 ENCOUNTER — Ambulatory Visit (HOSPITAL_BASED_OUTPATIENT_CLINIC_OR_DEPARTMENT_OTHER)
Admission: RE | Admit: 2014-07-29 | Discharge: 2014-07-29 | Disposition: A | Discharge status: Home / Self Care | Payer: 59 | Source: Ambulatory Visit | Attending: Obstetrics and Gynecology | Admitting: Obstetrics and Gynecology

## 2014-07-29 ENCOUNTER — Encounter (HOSPITAL_BASED_OUTPATIENT_CLINIC_OR_DEPARTMENT_OTHER): Payer: Self-pay | Admitting: *Deleted

## 2014-07-29 DIAGNOSIS — R102 Pelvic and perineal pain: Secondary | ICD-10-CM | POA: Diagnosis not present

## 2014-07-29 DIAGNOSIS — Z8371 Family history of colonic polyps: Secondary | ICD-10-CM | POA: Insufficient documentation

## 2014-07-29 DIAGNOSIS — Z842 Family history of other diseases of the genitourinary system: Secondary | ICD-10-CM | POA: Diagnosis not present

## 2014-07-29 DIAGNOSIS — Z6836 Body mass index (BMI) 36.0-36.9, adult: Secondary | ICD-10-CM | POA: Diagnosis not present

## 2014-07-29 DIAGNOSIS — G8929 Other chronic pain: Secondary | ICD-10-CM | POA: Insufficient documentation

## 2014-07-29 DIAGNOSIS — Z885 Allergy status to narcotic agent status: Secondary | ICD-10-CM | POA: Insufficient documentation

## 2014-07-29 DIAGNOSIS — Z8349 Family history of other endocrine, nutritional and metabolic diseases: Secondary | ICD-10-CM | POA: Insufficient documentation

## 2014-07-29 DIAGNOSIS — Z832 Family history of diseases of the blood and blood-forming organs and certain disorders involving the immune mechanism: Secondary | ICD-10-CM | POA: Diagnosis not present

## 2014-07-29 DIAGNOSIS — F4542 Pain disorder with related psychological factors: Secondary | ICD-10-CM | POA: Insufficient documentation

## 2014-07-29 DIAGNOSIS — Z86010 Personal history of colonic polyps: Secondary | ICD-10-CM | POA: Diagnosis not present

## 2014-07-29 DIAGNOSIS — K219 Gastro-esophageal reflux disease without esophagitis: Secondary | ICD-10-CM | POA: Insufficient documentation

## 2014-07-29 HISTORY — PX: LAPAROSCOPY: SHX197

## 2014-07-29 HISTORY — DX: Personal history of colon polyps, unspecified: Z86.0100

## 2014-07-29 HISTORY — DX: Pelvic and perineal pain: R10.2

## 2014-07-29 HISTORY — DX: Personal history of other diseases of the female genital tract: Z87.42

## 2014-07-29 HISTORY — DX: Personal history of colonic polyps: Z86.010

## 2014-07-29 LAB — CBC
HEMATOCRIT: 39.2 % (ref 36.0–46.0)
Hemoglobin: 12.8 g/dL (ref 12.0–15.0)
MCH: 29.5 pg (ref 26.0–34.0)
MCHC: 32.7 g/dL (ref 30.0–36.0)
MCV: 90.3 fL (ref 78.0–100.0)
Platelets: 189 10*3/uL (ref 150–400)
RBC: 4.34 MIL/uL (ref 3.87–5.11)
RDW: 13 % (ref 11.5–15.5)
WBC: 6.7 10*3/uL (ref 4.0–10.5)

## 2014-07-29 LAB — POCT PREGNANCY, URINE: PREG TEST UR: NEGATIVE

## 2014-07-29 SURGERY — LAPAROSCOPY, DIAGNOSTIC
Anesthesia: General | Site: Abdomen

## 2014-07-29 MED ORDER — CEFAZOLIN SODIUM-DEXTROSE 2-3 GM-% IV SOLR
INTRAVENOUS | Status: AC
  Filled 2014-07-29: qty 50

## 2014-07-29 MED ORDER — KETOROLAC TROMETHAMINE 30 MG/ML IJ SOLN
INTRAMUSCULAR | Status: DC | PRN
  Administered 2014-07-29: 30 mg via INTRAVENOUS

## 2014-07-29 MED ORDER — LACTATED RINGERS IV SOLN
INTRAVENOUS | Status: DC
  Administered 2014-07-29: 07:00:00 via INTRAVENOUS
  Filled 2014-07-29: qty 1000

## 2014-07-29 MED ORDER — FENTANYL CITRATE 0.05 MG/ML IJ SOLN
INTRAMUSCULAR | Status: AC
  Filled 2014-07-29: qty 2

## 2014-07-29 MED ORDER — PROPOFOL 10 MG/ML IV BOLUS
INTRAVENOUS | Status: DC | PRN
  Administered 2014-07-29: 200 mg via INTRAVENOUS

## 2014-07-29 MED ORDER — NEOSTIGMINE METHYLSULFATE 10 MG/10ML IV SOLN
INTRAVENOUS | Status: DC | PRN
  Administered 2014-07-29: 3 mg via INTRAVENOUS

## 2014-07-29 MED ORDER — ROCURONIUM BROMIDE 100 MG/10ML IV SOLN
INTRAVENOUS | Status: DC | PRN
Start: 2014-07-29 — End: 2014-07-29
  Administered 2014-07-29: 20 mg via INTRAVENOUS

## 2014-07-29 MED ORDER — FENTANYL CITRATE 0.05 MG/ML IJ SOLN
25.0000 ug | INTRAMUSCULAR | Status: DC | PRN
  Administered 2014-07-29: 25 ug via INTRAVENOUS
  Administered 2014-07-29: 50 ug via INTRAVENOUS
  Filled 2014-07-29: qty 1

## 2014-07-29 MED ORDER — SUCCINYLCHOLINE CHLORIDE 20 MG/ML IJ SOLN
INTRAMUSCULAR | Status: DC | PRN
  Administered 2014-07-29: 100 mg via INTRAVENOUS

## 2014-07-29 MED ORDER — PROMETHAZINE HCL 25 MG/ML IJ SOLN
6.2500 mg | INTRAMUSCULAR | Status: DC | PRN
  Filled 2014-07-29: qty 1

## 2014-07-29 MED ORDER — FENTANYL CITRATE 0.05 MG/ML IJ SOLN
INTRAMUSCULAR | Status: AC
  Filled 2014-07-29: qty 6

## 2014-07-29 MED ORDER — LACTATED RINGERS IV SOLN
INTRAVENOUS | Status: DC
  Filled 2014-07-29: qty 1000

## 2014-07-29 MED ORDER — MIDAZOLAM HCL 2 MG/2ML IJ SOLN
INTRAMUSCULAR | Status: AC
  Filled 2014-07-29: qty 2

## 2014-07-29 MED ORDER — CEFAZOLIN SODIUM-DEXTROSE 2-3 GM-% IV SOLR
2.0000 g | INTRAVENOUS | Status: AC
  Administered 2014-07-29: 2 g via INTRAVENOUS
  Filled 2014-07-29: qty 50

## 2014-07-29 MED ORDER — FENTANYL CITRATE 0.05 MG/ML IJ SOLN
INTRAMUSCULAR | Status: DC | PRN
  Administered 2014-07-29 (×2): 50 ug via INTRAVENOUS
  Administered 2014-07-29: 25 ug via INTRAVENOUS
  Administered 2014-07-29: 50 ug via INTRAVENOUS
  Administered 2014-07-29: 25 ug via INTRAVENOUS
  Administered 2014-07-29: 50 ug via INTRAVENOUS
  Administered 2014-07-29 (×2): 25 ug via INTRAVENOUS

## 2014-07-29 MED ORDER — LACTATED RINGERS IR SOLN: Status: DC | PRN

## 2014-07-29 MED ORDER — LIDOCAINE HCL (CARDIAC) 20 MG/ML IV SOLN
INTRAVENOUS | Status: DC | PRN
  Administered 2014-07-29: 60 mg via INTRAVENOUS

## 2014-07-29 MED ORDER — MIDAZOLAM HCL 5 MG/5ML IJ SOLN
INTRAMUSCULAR | Status: DC | PRN
  Administered 2014-07-29: 2 mg via INTRAVENOUS

## 2014-07-29 MED ORDER — DEXAMETHASONE SODIUM PHOSPHATE 4 MG/ML IJ SOLN
INTRAMUSCULAR | Status: DC | PRN
  Administered 2014-07-29: 8 mg via INTRAVENOUS

## 2014-07-29 MED ORDER — OXYCODONE-ACETAMINOPHEN 10-325 MG PO TABS: 1.0000 | ORAL_TABLET | ORAL | Status: DC | PRN

## 2014-07-29 MED ORDER — GLYCOPYRROLATE 0.2 MG/ML IJ SOLN
INTRAMUSCULAR | Status: DC | PRN
  Administered 2014-07-29: 0.4 mg via INTRAVENOUS

## 2014-07-29 MED ORDER — BUPIVACAINE HCL (PF) 0.25 % IJ SOLN
INTRAMUSCULAR | Status: DC | PRN
  Administered 2014-07-29: 5 mL

## 2014-07-29 MED ORDER — ONDANSETRON HCL 4 MG/2ML IJ SOLN
INTRAMUSCULAR | Status: DC | PRN
Start: 2014-07-29 — End: 2014-07-29
  Administered 2014-07-29: 4 mg via INTRAVENOUS

## 2014-07-29 SURGICAL SUPPLY — 42 items
APPLICATOR COTTON TIP 6IN STRL (MISCELLANEOUS) ×2 IMPLANT
BAG URINE DRAINAGE (UROLOGICAL SUPPLIES) IMPLANT
BANDAGE ADH SHEER 1  50/CT (GAUZE/BANDAGES/DRESSINGS) ×2 IMPLANT
BANDAGE ADHESIVE 1X3 (GAUZE/BANDAGES/DRESSINGS) ×4 IMPLANT
BLADE CLIPPER SURG (BLADE) IMPLANT
BLADE SURG 11 STRL SS (BLADE) ×2 IMPLANT
CANISTER SUCTION 2500CC (MISCELLANEOUS) IMPLANT
CATH FOLEY 2WAY SLVR  5CC 16FR (CATHETERS)
CATH FOLEY 2WAY SLVR 5CC 16FR (CATHETERS) IMPLANT
CATH ROBINSON RED A/P 16FR (CATHETERS) ×2 IMPLANT
CLOTH BEACON ORANGE TIMEOUT ST (SAFETY) ×2 IMPLANT
DRAPE CAMERA CLOSED 9X96 (DRAPES) ×2 IMPLANT
DRAPE UNDERBUTTOCKS STRL (DRAPE) ×2 IMPLANT
DRSG TELFA 3X8 NADH (GAUZE/BANDAGES/DRESSINGS) IMPLANT
ELECT REM PT RETURN 9FT ADLT (ELECTROSURGICAL) ×2
ELECTRODE REM PT RTRN 9FT ADLT (ELECTROSURGICAL) ×1 IMPLANT
GLOVE BIO SURGEON STRL SZ 6.5 (GLOVE) ×6 IMPLANT
GLOVE INDICATOR 6.5 STRL GRN (GLOVE) ×2 IMPLANT
GOWN PREVENTION PLUS LG XLONG (DISPOSABLE) ×4 IMPLANT
LIQUID BAND (GAUZE/BANDAGES/DRESSINGS) ×2 IMPLANT
NEEDLE HYPO 25X1 1.5 SAFETY (NEEDLE) IMPLANT
NEEDLE INSUFFLATION 14GA 120MM (NEEDLE) IMPLANT
NEEDLE INSUFFLATION 14GA 150MM (NEEDLE) ×2 IMPLANT
NS IRRIG 500ML POUR BTL (IV SOLUTION) ×2 IMPLANT
PACK BASIN DAY SURGERY FS (CUSTOM PROCEDURE TRAY) ×2 IMPLANT
PACK LAPAROSCOPY II (CUSTOM PROCEDURE TRAY) ×2 IMPLANT
PAD OB MATERNITY 4.3X12.25 (PERSONAL CARE ITEMS) ×2 IMPLANT
PAD PREP 24X48 CUFFED NSTRL (MISCELLANEOUS) ×2 IMPLANT
POUCH SPECIMEN RETRIEVAL 10MM (ENDOMECHANICALS) IMPLANT
SCISSORS LAP 5X35 DISP (ENDOMECHANICALS) IMPLANT
SOLUTION ANTI FOG 6CC (MISCELLANEOUS) ×2 IMPLANT
STRIP CLOSURE SKIN 1/4X4 (GAUZE/BANDAGES/DRESSINGS) IMPLANT
SUT VIC AB 3-0 PS2 18 (SUTURE) ×1
SUT VIC AB 3-0 PS2 18XBRD (SUTURE) ×1 IMPLANT
SUT VICRYL 0 UR6 27IN ABS (SUTURE) ×2 IMPLANT
SYR CONTROL 10ML LL (SYRINGE) IMPLANT
SYRINGE 10CC LL (SYRINGE) ×6 IMPLANT
TOWEL OR 17X24 6PK STRL BLUE (TOWEL DISPOSABLE) ×4 IMPLANT
TRAY DSU PREP LF (CUSTOM PROCEDURE TRAY) ×2 IMPLANT
TROCAR OPTI TIP 5M 100M (ENDOMECHANICALS) ×2 IMPLANT
TROCAR XCEL DIL TIP R 11M (ENDOMECHANICALS) ×2 IMPLANT
TUBING INSUFFLATION 10FT LAP (TUBING) ×2 IMPLANT

## 2014-07-29 NOTE — Transfer of Care (Signed)
Immediate Anesthesia Transfer of Care Note  Patient: Michelle Kane  Procedure(s) Performed: Procedure(s) (LRB): LAPAROSCOPY DIAGNOSTIC (N/A)  Patient Location: PACU  Anesthesia Type: General  Level of Consciousness: awake, oriented, sedated and patient cooperative  Airway & Oxygen Therapy: Patient Spontanous Breathing and Patient connected to face mask oxygen  Post-op Assessment: Report given to PACU RN and Post -op Vital signs reviewed and stable  Post vital signs: Reviewed and stable  Complications: No apparent anesthesia complications

## 2014-07-29 NOTE — Anesthesia Postprocedure Evaluation (Signed)
  Anesthesia Post-op Note  Patient: Michelle Kane  Procedure(s) Performed: Procedure(s) (LRB): LAPAROSCOPY DIAGNOSTIC (N/A)  Patient Location: PACU  Anesthesia Type: General  Level of Consciousness: awake and alert   Airway and Oxygen Therapy: Patient Spontanous Breathing  Post-op Pain: mild  Post-op Assessment: Post-op Vital signs reviewed, Patient's Cardiovascular Status Stable, Respiratory Function Stable, Patent Airway and No signs of Nausea or vomiting  Last Vitals:  Filed Vitals:   07/29/14 0950  BP: 128/77  Pulse:   Temp: 36.4 C  Resp: 16    Post-op Vital Signs: stable   Complications: No apparent anesthesia complications

## 2014-07-29 NOTE — Anesthesia Procedure Notes (Signed)
Procedure Name: Intubation Date/Time: 07/29/2014 7:33 AM Performed by: Denna Haggard D Pre-anesthesia Checklist: Patient identified, Emergency Drugs available, Suction available and Patient being monitored Patient Re-evaluated:Patient Re-evaluated prior to inductionOxygen Delivery Method: Circle System Utilized Preoxygenation: Pre-oxygenation with 100% oxygen Intubation Type: IV induction Ventilation: Mask ventilation without difficulty Laryngoscope Size: Mac and 3 Grade View: Grade I Tube type: Oral Tube size: 7.0 mm Number of attempts: 1 Airway Equipment and Method: Stylet and Oral airway Placement Confirmation: ETT inserted through vocal cords under direct vision,  positive ETCO2 and breath sounds checked- equal and bilateral Secured at: 22 cm Tube secured with: Tape Dental Injury: Teeth and Oropharynx as per pre-operative assessment

## 2014-07-29 NOTE — Op Note (Signed)
NAMEABBYGAIL, WILLHOITE NO.:  [id number]  MEDICAL RECORD NO.:  [id number]  LOCATION:                                 FACILITY:  PHYSICIAN:  Kamree Wiens L. Micole Delehanty, M.D.DATE OF BIRTH:  10-Apr-1986  DATE OF PROCEDURE:  07/29/2014 DATE OF DISCHARGE:                              OPERATIVE REPORT   PREOPERATIVE DIAGNOSIS:  Chronic pelvic pain.  POSTOPERATIVE DIAGNOSIS:  Chronic pelvic pain.  PROCEDURE:  Diagnostic laparoscopy.  SURGEON:  Garlon Tuggle L. Helane Rima, MD  ASSISTANT:  None.  ANESTHESIA:  General.  ESTIMATED BLOOD LOSS:  None.  DRAINS:  None.  Local with Marcaine.  PATHOLOGY:  None.  DESCRIPTION OF PROCEDURE:  The patient was taken to the operating room after consent was obtained about the risk associated together with the procedure.  She was then prepped and draped in usual sterile fashion after she was intubated.  The uterine manipulator was inserted and the bladder was emptied with an in-and-out catheter.  Attention was turned to the abdomen where a small infraumbilical incision was made, and a Veress needle was inserted.  Local had been injected at the incision site.  Pneumoperitoneum was performed.  Opening pressure was 7 mm. Pneumoperitoneum was easily performed up to 15 mmHg.  The Veress needle was removed and 11 mm trocar was inserted.  The patient was gently placed in Trendelenburg position after the laparoscope was introduced and no area of intestinal injury or bleeding was noted.  The pelvis was inspected.  I then placed a __________ suprapubically using a 5 mm trocar.  The pelvis appeared normal.  She had no adhesions and no evidence of endometriosis.  The bladder flap was cleaned.  The uterus appeared normal.  The adnexa were normal and consistent with polycystic ovaries.  Fallopian tubes were normal.  Cul-de-sac was clean.  The bowel surfaces appeared normal.  Liver surface also appeared normal.  At the end of the procedure, I then removed  all instruments after pneumoperitoneum was released.  The infraumbilical site was closed with 0 Vicryl.  The skin was closed with 3-0 Vicryl interrupted.  All sponge, lap, and instrument counts were correct x2.  All instruments were removed from the vagina prior to extubation.  She was going to recovery room in stable condition.     Sarissa Dern L. Helane Rima, M.D.     Nevin Bloodgood  D:  07/29/2014  T:  07/29/2014  Job:  [id number]

## 2014-07-29 NOTE — H&P (Signed)
  29 year old G 2 P 2 on Trinessa presents for evaluation of chronic pelvic pain x 4 months. Patient has been evaluated multiple times by me. Pain is lower abdomen / pelvis. The pain has been unrelieved by antibiotics, muscle relaxants and physical therapy. There is a strong family history of endometriosis.   Cervical cultures negative. Pelvic ultrasound is negative.  ALLERGIC TO VICODIN  Medications Bertram Millard  Prilosec   Surgical History  C Section Right arm surgery  ROS  Heachaches Nausea Pain  Social Hisotry Tobacco No alcohol  Afebrile VSS General alert and oriented Lung CTAB Car RRR Abdomen is soft and ,non tender  Pelvic external genitalia within normal limits. Vagina appears normal. Cervix  - no lesions.. She does have some tenderness with movement of the cervix and uterus more on the right than on the left.  IMPRESSION: Chronic Pelvic Pain  PLAN: Diagnostic Laparoscopy Possible fulguration of endometriosis Risks reviewed Consent signed

## 2014-07-29 NOTE — Brief Op Note (Signed)
07/29/2014  8:04 AM  PATIENT:  Michelle Kane  29 y.o. female  PRE-OPERATIVE DIAGNOSIS:  Chronic Pelvic Pain  POST-OPERATIVE DIAGNOSIS:  Same  PROCEDURE:  Procedure(s): LAPAROSCOPY DIAGNOSTIC (N/A)  SURGEON:  Surgeon(s) and Role:    * Cyril Mourning, MD - Primary  PHYSICIAN ASSISTANT:   ASSISTANTS: none   ANESTHESIA:   general  EBL:  Total I/O In: 200 [I.V.:200] Out: -   BLOOD ADMINISTERED:none  DRAINS: none   LOCAL MEDICATIONS USED:  MARCAINE     SPECIMEN:  No Specimen  DISPOSITION OF SPECIMEN:  N/A  COUNTS:  YES  TOURNIQUET:  * No tourniquets in log *  DICTATION: .Other Dictation: Dictation Number dictated  PLAN OF CARE: Discharge to home after PACU  PATIENT DISPOSITION:  PACU - hemodynamically stable.   Delay start of Pharmacological VTE agent (>24hrs) due to surgical blood loss or risk of bleeding: not applicable

## 2014-07-29 NOTE — Discharge Instructions (Signed)
Diagnostic Laparoscopy Laparoscopy is a surgical procedure. It is used to diagnose and treat diseases inside the belly (abdomen). It is usually a brief, common, and relatively simple procedure. The laparoscopeis a thin, lighted, pencil-sized instrument. It is like a telescope. It is inserted into your abdomen through a small cut (incision). Your caregiver can look at the organs inside your body through this instrument. He or she can see if there is anything abnormal. Laparoscopy can be done either in a hospital or outpatient clinic. You may be given a mild sedative to help you relax before the procedure. Once in the operating room, you will be given a drug to make you sleep (general anesthesia). Laparoscopy usually lasts less than 1 hour. After the procedure, you will be monitored in a recovery area until you are stable and doing well. Once you are home, it will take 2 to 3 days to fully recover. RISKS AND COMPLICATIONS  Laparoscopy has relatively few risks. Your caregiver will discuss the risks with you before the procedure. Some problems that can occur include:  Infection.  Bleeding.  Damage to other organs.  Anesthetic side effects. PROCEDURE Once you receive anesthesia, your surgeon inflates the abdomen with a harmless gas (carbon dioxide). This makes the organs easier to see. The laparoscope is inserted into the abdomen through a small incision. This allows your surgeon to see into the abdomen. Other small instruments are also inserted into the abdomen through other small openings. Many surgeons attach a video camera to the laparoscope to enlarge the view. During a diagnostic laparoscopy, the surgeon may be looking for inflammation, infection, or cancer. Your surgeon may take tissue samples(biopsies). The samples are sent to a specialist in looking at cells and tissue samples (pathologist). The pathologist examines them under a microscope. Biopsies can help to diagnose or confirm a  disease. AFTER THE PROCEDURE   The gas is released from inside the abdomen.  The incisions are closed with stitches (sutures). Because these incisions are small (usually less than 1/2 inch), there is usually minimal discomfort after the procedure. There may be some mild discomfort in the throat. This is from the tube placed in the throat while you were sleeping. You may have some mild abdominal discomfort. There may also be discomfort from the instrument placement incisions in the abdomen.  The recovery time is shortened as long as there are no complications.  You will rest in a recovery room until stable and doing well. As long as there are no complications, you may be allowed to go home. FINDING OUT THE RESULTS OF YOUR TEST Not all test results are available during your visit. If your test results are not back during the visit, make an appointment with your caregiver to find out the results. Do not assume everything is normal if you have not heard from your caregiver or the medical facility. It is important for you to follow up on all of your test results. HOME CARE INSTRUCTIONS   Take all medicines as directed.  Only take over-the-counter or prescription medicines for pain, discomfort, or fever as directed by your caregiver.  Resume daily activities as directed.  Showers are preferred over baths.  You may resume sexual activities in 1 week or as directed.  Do not drive while taking narcotics. SEEK MEDICAL CARE IF:   There is increasing abdominal pain.  There is new pain in the shoulders (shoulder strap areas).  You feel lightheaded or faint.  You have the chills.  You or  your child has an oral temperature above 102 F (38.9 C).  There is pus-like (purulent) drainage from any of the wounds.  You are unable to pass gas or have a bowel movement.  You feel sick to your stomach (nauseous) or throw up (vomit). MAKE SURE YOU:   Understand these instructions.  Will watch  your condition.  Will get help right away if you are not doing well or get worse. Document Released: 10/07/2000 Document Revised: 10/26/2012 Document Reviewed: 07/01/2007 [facility] Patient Information 2015 [place], [place]. This information is not intended to replace advice given to you by your health care provider. Make sure you discuss any questions you have with your health care provider.    Call your surgeon if you experience:   1.  Fever over 101.0. 2.  Inability to urinate. 3.  Nausea and/or vomiting. 4.  Extreme swelling or bruising at the surgical site. 5.  Continued bleeding from the incision. 6.  Increased pain, redness or drainage from the incision. 7.  Problems related to your pain medication. 8. Any change in color, movement and/or sensation 9. Any problems and/or concerns    Post Anesthesia Home Care Instructions  Activity: Get plenty of rest for the remainder of the day. A responsible adult should stay with you for 24 hours following the procedure.  For the next 24 hours, DO NOT: -Drive a car -Paediatric nurse -Drink alcoholic beverages -Take any medication unless instructed by your physician -Make any legal decisions or sign important papers.  Meals: Start with liquid foods such as gelatin or soup. Progress to regular foods as tolerated. Avoid greasy, spicy, heavy foods. If nausea and/or vomiting occur, drink only clear liquids until the nausea and/or vomiting subsides. Call your physician if vomiting continues.  Special Instructions/Symptoms: Your throat may feel dry or sore from the anesthesia or the breathing tube placed in your throat during surgery. If this causes discomfort, gargle with warm salt water. The discomfort should disappear within 24 hours.

## 2014-08-02 ENCOUNTER — Encounter (HOSPITAL_BASED_OUTPATIENT_CLINIC_OR_DEPARTMENT_OTHER): Payer: Self-pay | Admitting: Obstetrics and Gynecology

## 2014-08-10 ENCOUNTER — Encounter (HOSPITAL_BASED_OUTPATIENT_CLINIC_OR_DEPARTMENT_OTHER): Payer: Self-pay | Admitting: Obstetrics and Gynecology

## 2014-08-18 DIAGNOSIS — R102 Pelvic and perineal pain: Secondary | ICD-10-CM

## 2014-08-18 DIAGNOSIS — G8929 Other chronic pain: Secondary | ICD-10-CM | POA: Insufficient documentation

## 2014-09-14 ENCOUNTER — Encounter: Payer: Self-pay | Admitting: Internal Medicine

## 2014-09-14 ENCOUNTER — Ambulatory Visit (INDEPENDENT_AMBULATORY_CARE_PROVIDER_SITE_OTHER): Payer: 59 | Admitting: Internal Medicine

## 2014-09-14 VITALS — BP 124/70 | HR 89 | Temp 98.1°F | Wt 232.0 lb

## 2014-09-14 DIAGNOSIS — J029 Acute pharyngitis, unspecified: Secondary | ICD-10-CM

## 2014-09-14 DIAGNOSIS — J4521 Mild intermittent asthma with (acute) exacerbation: Secondary | ICD-10-CM

## 2014-09-14 LAB — POCT RAPID STREP A (OFFICE): RAPID STREP A SCREEN: NEGATIVE

## 2014-09-14 MED ORDER — PREDNISONE 10 MG PO TABS: ORAL_TABLET | ORAL | Status: DC

## 2014-09-14 MED ORDER — PROAIR HFA 108 (90 BASE) MCG/ACT IN AERS: 1.0000 | INHALATION_SPRAY | RESPIRATORY_TRACT | Status: DC | PRN

## 2014-09-14 NOTE — Progress Notes (Signed)
HPI  Pt presents to the clinic today with c/o cough, chest congestion, wheezing and shortness of breath. She reports her symptoms started 1 week ago but have gotten much worse over the last day. The cough is productive of white mucous. She denies fever, chills or body aches. She has tried Mucinex OTC without relief. She does have a history of allergy induced asthma. She has had sick contacts with similar symptoms. She does smoke.  Review of Systems      Past Medical History  Diagnosis Date  . GERD (gastroesophageal reflux disease)   . Allergy-induced asthma   . 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 History of ovarian cyst   . Pelvic pain in female   . History of colon polyps     Family History  Problem Relation Age of Onset  . Hyperlipidemia Mother   . Crohn's disease Mother   . Colon polyps Mother   . Hyperlipidemia Father   . Hemophilia Father   . Hemophilia Son     History   Social History  . Marital Status: Single    Spouse Name: N/A  . Number of Children: N/A  . Years of Education: N/A   Occupational History  . Not on file.   Social History Main Topics  . Smoking status: Current Every Day Smoker -- 0.50 packs/day for 10 years    Types: Cigarettes  . Smokeless tobacco: Never Used  . Alcohol Use: No     Comment: social  . Drug Use: No  . Sexual Activity: Yes    Birth Control/ Protection: Pill   Other Topics Concern  . Not on file   Social History Narrative    Allergies  Allergen Reactions  . Vicodin [Hydrocodone-Acetaminophen] Itching and Nausea And Vomiting     Constitutional:  Denies headache, fatigue, fever or abrupt weight changes.  HEENT:  Positive sore throat. Denies eye redness, eye pain, pressure behind the eyes, facial pain, nasal congestion, ear pain, ringing in the ears, wax buildup, runny nose or bloody nose. Respiratory: Positive cough and shortness of breath. Denies difficulty breathing.  Cardiovascular: Denies chest pain, chest  tightness, palpitations or swelling in the hands or feet.   No other specific complaints in a complete review of systems (except as listed in HPI above).  Objective:  BP 124/70 mmHg  Pulse 89  Temp(Src) 98.1 F (36.7 C) (Oral)  Wt 232 lb (105.235 kg)  SpO2 98%  LMP 08/18/2014    Wt Readings from Last 3 Encounters:  09/14/14 232 lb (105.235 kg)  07/29/14 230 lb (104.327 kg)  05/17/14 227 lb (102.967 kg)     General: Appears her stated age, well developed, well nourished in NAD. HEENT: Head: normal shape and size, no sinus tenderness noted; Eyes: sclera white, no icterus, conjunctiva pink; Ears: Tm's gray and intact, normal light reflex; Throat/Mouth:  Teeth present, mucosa erythematous and moist, no exudate noted, no lesions or ulcerations noted.  Neck: No lymphadenopathy.  Cardiovascular: Normal rate and rhythm. S1,S2 noted.  No murmur, rubs or gallops noted.  Pulmonary/Chest: Normal effort and intermittent bilateral inspiratory and expiratory wheezing noted. No respiratory distress.       Assessment & Plan:   Asthma Exacerbation:  Get some rest and drink plenty of water RST: negative Do salt water gargles for the sore throat eRx for Pred Taper x 6 days [employer] refilled Delsym OTC for cough  If no improvement after you finish the prednisone, will call in zpack  RTC  as needed or if symptoms persist.

## 2014-09-14 NOTE — Progress Notes (Signed)
Pre visit review using our clinic review tool, if applicable. No additional management support is needed unless otherwise documented below in the visit note. 

## 2014-09-14 NOTE — Patient Instructions (Signed)

## 2014-10-28 ENCOUNTER — Inpatient Hospital Stay (HOSPITAL_COMMUNITY)
Admission: AD | Admit: 2014-10-28 | Discharge: 2014-10-28 | Disposition: A | Discharge status: Home / Self Care | Payer: 59 | Source: Ambulatory Visit | Attending: Obstetrics and Gynecology | Admitting: Obstetrics and Gynecology

## 2014-10-28 ENCOUNTER — Inpatient Hospital Stay (HOSPITAL_COMMUNITY): Payer: 59

## 2014-10-28 ENCOUNTER — Encounter (HOSPITAL_COMMUNITY): Payer: Self-pay

## 2014-10-28 DIAGNOSIS — Z3A01 Less than 8 weeks gestation of pregnancy: Secondary | ICD-10-CM | POA: Diagnosis not present

## 2014-10-28 DIAGNOSIS — O9989 Other specified diseases and conditions complicating pregnancy, childbirth and the puerperium: Secondary | ICD-10-CM | POA: Insufficient documentation

## 2014-10-28 DIAGNOSIS — R109 Unspecified abdominal pain: Secondary | ICD-10-CM | POA: Diagnosis not present

## 2014-10-28 DIAGNOSIS — F17210 Nicotine dependence, cigarettes, uncomplicated: Secondary | ICD-10-CM | POA: Insufficient documentation

## 2014-10-28 DIAGNOSIS — O99331 Smoking (tobacco) complicating pregnancy, first trimester: Secondary | ICD-10-CM | POA: Insufficient documentation

## 2014-10-28 DIAGNOSIS — O26899 Other specified pregnancy related conditions, unspecified trimester: Secondary | ICD-10-CM

## 2014-10-28 LAB — URINALYSIS, ROUTINE W REFLEX MICROSCOPIC
Bilirubin Urine: NEGATIVE
GLUCOSE, UA: NEGATIVE mg/dL
HGB URINE DIPSTICK: NEGATIVE
Ketones, ur: NEGATIVE mg/dL
Leukocytes, UA: NEGATIVE
Nitrite: NEGATIVE
PH: 6 (ref 5.0–8.0)
Protein, ur: NEGATIVE mg/dL
Specific Gravity, Urine: 1.025 (ref 1.005–1.030)
UROBILINOGEN UA: 0.2 mg/dL (ref 0.0–1.0)

## 2014-10-28 LAB — CBC
HCT: 35.1 % — ABNORMAL LOW (ref 36.0–46.0)
Hemoglobin: 11.8 g/dL — ABNORMAL LOW (ref 12.0–15.0)
MCH: 30.5 pg (ref 26.0–34.0)
MCHC: 33.6 g/dL (ref 30.0–36.0)
MCV: 90.7 fL (ref 78.0–100.0)
Platelets: 201 10*3/uL (ref 150–400)
RBC: 3.87 MIL/uL (ref 3.87–5.11)
RDW: 13.4 % (ref 11.5–15.5)
WBC: 9.9 10*3/uL (ref 4.0–10.5)

## 2014-10-28 LAB — POCT PREGNANCY, URINE: PREG TEST UR: POSITIVE — AB

## 2014-10-28 LAB — HCG, QUANTITATIVE, PREGNANCY: HCG, BETA CHAIN, QUANT, S: 2858 m[IU]/mL — AB (ref <5)

## 2014-10-28 NOTE — Discharge Instructions (Signed)

## 2014-10-28 NOTE — MAU Note (Signed)
5wks ago, had +HPT, started bleeding, bled for 5 days before she went to the dr. Their blood test was neg.  Haven't bled since then.  2+HPT yesterday. Having abd and back pain for 5 wks straight.

## 2014-10-28 NOTE — MAU Provider Note (Signed)
History     CSN: [id number]  Arrival date and time: 10/28/14 1815   First Provider Initiated Contact with Patient 10/28/14 1900      Chief Complaint  Patient presents with  . Possible Pregnancy  . Abdominal Pain  . Back Pain   HPI Michelle Kane 29 y.o. [id number] @[redacted]w[redacted]d  presents to MAU complaining of abdominal pain.  She states 6 weeks ago, she had positive PRT.  3 days later, she started bleeding, like a period.  She went to private OB that did u/s showing sac and lab work which stated not pregnant.  She continued to have pain from this time until today, when she took another pregnancy test and it was again positive.  She also has breast tenderness and is late to start menses.  The pain is lower abdomen on both sides and radiates to lower back.  It is worse with moving and certain positions.  It does occasionally ease off but does not go away entirely.  Now it is 4/10 and can be up to 9/10, feels like stretching.  No temporal factors.  Has not tried medications.  She denies vaginal bleeding, discharge, fever, weakness, CP, SOB.   She does have nausea, heartburn, headache.  She has appt on Monday with private MD.   OB History    Gravida Para Term Preterm AB TAB SAB Ectopic Multiple Living   3 2 2       2       Past Medical History  Diagnosis Date  . GERD (gastroesophageal reflux disease)   . Allergy-induced asthma   . 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 History of ovarian cyst   . Pelvic pain in female   . History of colon polyps     Past Surgical History  Procedure Laterality Date  . Cesarean section  03-01-2008  . Im pinning right elbow fx  [place]  . Laparoscopy N/A 07/29/2014    Procedure: LAPAROSCOPY DIAGNOSTIC;  Surgeon: Cyril Mourning, MD;  Location: [facility];  Service: Gynecology;  Laterality: N/A;    Family History  Problem Relation Age of Onset  . Hyperlipidemia Mother   . Crohn's disease Mother   . Colon polyps Mother   .  Hyperlipidemia Father   . Hemophilia Father   . Hemophilia Son     History  Substance Use Topics  . Smoking status: Current Every Day Smoker -- 0.50 packs/day for 10 years    Types: Cigarettes  . Smokeless tobacco: Never Used  . Alcohol Use: No     Comment: social    Allergies:  Allergies  Allergen Reactions  . Vicodin [Hydrocodone-Acetaminophen] Itching and Nausea And Vomiting    Prescriptions prior to admission  Medication Sig Dispense Refill Last Dose  . ELMIRON 100 MG capsule   0 Taking  . hydrOXYzine (ATARAX/VISTARIL) 25 MG tablet   0 Taking  . Norgestimate-Ethinyl Estradiol Triphasic (ORTHO TRI-CYCLEN LO) 0.18/0.215/0.25 MG-25 MCG tab Take 1 tablet by mouth every evening. AT 1430   Not Taking  . omeprazole (PRILOSEC) 40 MG capsule Take 1 capsule (40 mg total) by mouth daily. (Patient taking differently: Take 40 mg by mouth every morning. ) 30 capsule 1 Taking  . oxyCODONE-acetaminophen (PERCOCET) 10-325 MG per tablet Take 1 tablet by mouth every 4 (four) hours as needed for pain. (Patient not taking: Reported on 09/14/2014) 30 tablet 0 Not Taking  . predniSONE (DELTASONE) 10 MG tablet Take 6 tabs day  1, 5 tabs day 2, 4 tabs day 3, 3 tabs day 4, 2 tabs day 5, 1 tab day 6 21 tablet 0   . PROAIR HFA 108 (90 BASE) MCG/ACT inhaler Inhale 1-2 puffs into the lungs every 4 (four) hours as needed for wheezing or shortness of breath. 1 Inhaler 1   . traMADol (ULTRAM) 50 MG tablet Take 50 mg by mouth every 6 (six) hours as needed.   Taking    ROS  Pertinent ROS in HPI   Physical Exam   Blood pressure 137/79, pulse 104, temperature 98.5 F (36.9 C), temperature source Oral, resp. rate 18, height 5' 5.5" (1.664 m), weight 238 lb (107.956 kg), last menstrual period 09/18/2014.  Physical Exam  Constitutional: She is oriented to person, place, and time. She appears well-developed and well-nourished. No distress.  HENT:  Head: Normocephalic and atraumatic.  Eyes: EOM are normal.  Neck:  Normal range of motion.  Cardiovascular: Normal rate and regular rhythm.   Respiratory: Effort normal and breath sounds normal. No respiratory distress.  GI: Soft. Bowel sounds are normal. She exhibits no distension. There is tenderness. There is no rebound and no guarding.  bilat lower abd tenderness  Genitourinary:  Scant physiologic discharge No CMT.  No adnexal mass or tenderness Cx is closed  Musculoskeletal: Normal range of motion.  Neurological: She is alert and oriented to person, place, and time.  Skin: Skin is warm and dry.  Psychiatric: She has a normal mood and affect.   Results for orders placed or performed during the hospital encounter of 10/28/14 (from the past 24 hour(s))  Urinalysis, Routine w reflex microscopic     Status: None   Collection Time: 10/28/14  6:32 PM  Result Value Ref Range   Color, Urine YELLOW YELLOW   APPearance CLEAR CLEAR   Specific Gravity, Urine 1.025 1.005 - 1.030   pH 6.0 5.0 - 8.0   Glucose, UA NEGATIVE NEGATIVE mg/dL   Hgb urine dipstick NEGATIVE NEGATIVE   Bilirubin Urine NEGATIVE NEGATIVE   Ketones, ur NEGATIVE NEGATIVE mg/dL   Protein, ur NEGATIVE NEGATIVE mg/dL   Urobilinogen, UA 0.2 0.0 - 1.0 mg/dL   Nitrite NEGATIVE NEGATIVE   Leukocytes, UA NEGATIVE NEGATIVE  Pregnancy, urine POC     Status: Abnormal   Collection Time: 10/28/14  6:36 PM  Result Value Ref Range   Preg Test, Ur POSITIVE (A) NEGATIVE  CBC     Status: Abnormal   Collection Time: 10/28/14  7:15 PM  Result Value Ref Range   WBC 9.9 4.0 - 10.5 K/uL   RBC 3.87 3.87 - 5.11 MIL/uL   Hemoglobin 11.8 (L) 12.0 - 15.0 g/dL   HCT 35.1 (L) 36.0 - 46.0 %   MCV 90.7 78.0 - 100.0 fL   MCH 30.5 26.0 - 34.0 pg   MCHC 33.6 30.0 - 36.0 g/dL   RDW 13.4 11.5 - 15.5 %   Platelets 201 150 - 400 K/uL  hCG, quantitative, pregnancy     Status: Abnormal   Collection Time: 10/28/14  7:15 PM  Result Value Ref Range   hCG, Beta Chain, Quant, S 2858 (H) <5 mIU/mL   US Ob Comp Less 14  Wks  10/28/2014   CLINICAL DATA:  Abdominal and back pain for 5 weeks. Question spontaneous abortion in March. Previous ultrasound demonstrated gestational sac.  EXAM: OBSTETRIC <14 WK [place] AND TRANSVAGINAL OB US  TECHNIQUE: Both transabdominal and transvaginal ultrasound examinations were performed for complete evaluation of the  gestation as well as the maternal uterus, adnexal regions, and pelvic cul-de-sac. Transvaginal technique was performed to assess early pregnancy.  COMPARISON:  None.  FINDINGS: Intrauterine gestational sac: Visualized/ small in shape.  Yolk sac:  Question yolk sac, however not well-defined.  Embryo:  Not present.  Cardiac Activity: Not present.  MSD: 5.6  mm   5 w   1  d         [place] EDC: 06/25/2015  Maternal uterus/adnexae: Small subchorionic hemorrhage about the gestational sac. Both ovaries are visualized and appear normal with normal flow. There is a probable corpus luteal cyst in the right ovary. No pelvic free fluid.  IMPRESSION: Probable early intrauterine gestational sac with questionable yolk sac. No fetal pole or cardiac activity yet visualized. Mean sac diameter of 5.6 mm corresponds to gestational age of 06/25/2015. Recommend follow-up quantitative B-HCG levels and follow-up US in 14 days to confirm and assess viability. This recommendation follows SRU consensus guidelines: Diagnostic Criteria for Nonviable Pregnancy Early in the First Trimester. [employer] Med 2013; 829:5621-30.   Electronically Signed   By: Jeb Levering M.D.   On: 10/28/2014 20:37   US Ob Transvaginal  10/28/2014   CLINICAL DATA:  Abdominal and back pain for 5 weeks. Question spontaneous abortion in March. Previous ultrasound demonstrated gestational sac.  EXAM: OBSTETRIC <14 WK [place] AND TRANSVAGINAL OB US  TECHNIQUE: Both transabdominal and transvaginal ultrasound examinations were performed for complete evaluation of the gestation as well as the maternal uterus, adnexal regions, and pelvic cul-de-sac.  Transvaginal technique was performed to assess early pregnancy.  COMPARISON:  None.  FINDINGS: Intrauterine gestational sac: Visualized/ small in shape.  Yolk sac:  Question yolk sac, however not well-defined.  Embryo:  Not present.  Cardiac Activity: Not present.  MSD: 5.6  mm   5 w   1  d         [place] EDC: 06/25/2015  Maternal uterus/adnexae: Small subchorionic hemorrhage about the gestational sac. Both ovaries are visualized and appear normal with normal flow. There is a probable corpus luteal cyst in the right ovary. No pelvic free fluid.  IMPRESSION: Probable early intrauterine gestational sac with questionable yolk sac. No fetal pole or cardiac activity yet visualized. Mean sac diameter of 5.6 mm corresponds to gestational age of 06/25/2015. Recommend follow-up quantitative B-HCG levels and follow-up US in 14 days to confirm and assess viability. This recommendation follows SRU consensus guidelines: Diagnostic Criteria for Nonviable Pregnancy Early in the First Trimester. [employer] Med 2013; 865:7846-96.   Electronically Signed   By: Jeb Levering M.D.   On: 10/28/2014 20:37    MAU Course  Procedures  MDM Discussed with Dr. Radene Knee. Ectopic cannot be entirely ruled out as yolk sac is not definitive.  He is in agreement to discharge pt to home with f/u in clinic as scheduled on Monday.    Assessment and Plan  A:  1. Abdominal pain affecting pregnancy    P: DIscharge to home PNV qd OTC Tylenol prn discomfort Encourage po hydration Patient may return to MAU as needed or if her condition were to change or worsen.  In particular, worsening of pain or vaginal bleeding requires immediate attention.    Paticia Stack 10/28/2014, 7:01 PM

## 2014-11-11 ENCOUNTER — Encounter (HOSPITAL_COMMUNITY): Payer: Self-pay | Admitting: *Deleted

## 2014-11-11 ENCOUNTER — Inpatient Hospital Stay (HOSPITAL_COMMUNITY)
Admission: AD | Admit: 2014-11-11 | Discharge: 2014-11-11 | Disposition: A | Discharge status: Home / Self Care | Payer: 59 | Source: Ambulatory Visit | Attending: Obstetrics and Gynecology | Admitting: Obstetrics and Gynecology

## 2014-11-11 DIAGNOSIS — Z86010 Personal history of colonic polyps: Secondary | ICD-10-CM | POA: Insufficient documentation

## 2014-11-11 DIAGNOSIS — F17210 Nicotine dependence, cigarettes, uncomplicated: Secondary | ICD-10-CM | POA: Insufficient documentation

## 2014-11-11 DIAGNOSIS — Z3A08 8 weeks gestation of pregnancy: Secondary | ICD-10-CM | POA: Diagnosis not present

## 2014-11-11 DIAGNOSIS — O9989 Other specified diseases and conditions complicating pregnancy, childbirth and the puerperium: Secondary | ICD-10-CM | POA: Insufficient documentation

## 2014-11-11 DIAGNOSIS — Z3A01 Less than 8 weeks gestation of pregnancy: Secondary | ICD-10-CM | POA: Diagnosis not present

## 2014-11-11 DIAGNOSIS — R000 Tachycardia, unspecified: Secondary | ICD-10-CM | POA: Insufficient documentation

## 2014-11-11 DIAGNOSIS — O99331 Smoking (tobacco) complicating pregnancy, first trimester: Secondary | ICD-10-CM | POA: Diagnosis not present

## 2014-11-11 DIAGNOSIS — Z789 Other specified health status: Secondary | ICD-10-CM

## 2014-11-11 LAB — URINALYSIS, ROUTINE W REFLEX MICROSCOPIC
Bilirubin Urine: NEGATIVE
Glucose, UA: NEGATIVE mg/dL
Hgb urine dipstick: NEGATIVE
Ketones, ur: NEGATIVE mg/dL
LEUKOCYTES UA: NEGATIVE
Nitrite: NEGATIVE
PH: 6 (ref 5.0–8.0)
PROTEIN: NEGATIVE mg/dL
Specific Gravity, Urine: 1.02 (ref 1.005–1.030)
UROBILINOGEN UA: 0.2 mg/dL (ref 0.0–1.0)

## 2014-11-11 NOTE — MAU Provider Note (Signed)
History     CSN: [id number]  Arrival date and time: 11/11/14 0240   First Provider Initiated Contact with Patient 11/11/14 2003      Chief Complaint  Patient presents with  . Tachycardia   HPI  Michelle Kane is a 29 y.o. G3P2002 at [redacted]w[redacted]d who presents to MAU today with complaint of intermittent tachycardia earlier today while at work. The patient states that she was sweating and that her co-workers asked her why. She told them that it was probably because her pulse was elevated. 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 The patient states some associated nausea, but denies chest pain, abdominal pain or vaginal bleeding. This is a new problem.   OB History    Gravida Para Term Preterm AB TAB SAB Ectopic Multiple Living   3 2 2       2       Past Medical History  Diagnosis Date  . GERD (gastroesophageal reflux disease)   . Allergy-induced asthma   . 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 History of ovarian cyst   . Pelvic pain in female   . History of colon polyps     Past Surgical History  Procedure Laterality Date  . Cesarean section  03-01-2008  . Im pinning right elbow fx  [place]  . Laparoscopy N/A 07/29/2014    Procedure: LAPAROSCOPY DIAGNOSTIC;  Surgeon: Cyril Mourning, MD;  [facility];  Service: Gynecology;  Laterality: N/A;    Family History  Problem Relation Age of Onset  . Hyperlipidemia Mother   . Crohn's disease Mother   . Colon polyps Mother   . Hyperlipidemia Father   . Hemophilia Father   . Hemophilia Son     History  Substance Use Topics  . Smoking status: Current Every Day Smoker -- 0.50 packs/day for 10 years    Types: Cigarettes  . Smokeless tobacco: Never Used  . Alcohol Use: No     Comment: social    Allergies:  Allergies  Allergen Reactions  . Vicodin [Hydrocodone-Acetaminophen] Itching and  Nausea And Vomiting    No prescriptions prior to admission    Review of Systems  Constitutional: Negative for fever and malaise/fatigue.  Cardiovascular: Positive for palpitations. Negative for chest pain.  Gastrointestinal: Positive for nausea. Negative for vomiting and abdominal pain.  Genitourinary:       Neg - vaginal bleeding   Physical Exam   Blood pressure 126/82, pulse 90, temperature 98.1 F (36.7 C), resp. rate 8, height 5' 6.5" (1.689 m), weight 235 lb 6.4 oz (106.777 kg), last menstrual period 09/18/2014, SpO2 98 %.  Physical Exam  Nursing note and vitals reviewed. Constitutional: She is oriented to person, place, and time. She appears well-developed and well-nourished. No distress.  HENT:  Head: Normocephalic and atraumatic.  Cardiovascular: Normal rate, regular rhythm and normal heart sounds.   No murmur heard. Respiratory: Effort normal and breath sounds normal. No respiratory distress.  GI: Soft.  Neurological: She is alert and oriented to person, place, and time.  Skin: Skin is warm and dry. No erythema.  Psychiatric: She has a normal mood and affect.   Results for orders placed or performed during the hospital encounter of 11/11/14 (from the past 24 hour(s))  Urinalysis, Routine w reflex microscopic     Status: None  Collection Time: 11/11/14  7:50 PM  Result Value Ref Range   Color, Urine YELLOW YELLOW   APPearance CLEAR CLEAR   Specific Gravity, Urine 1.020 1.005 - 1.030   pH 6.0 5.0 - 8.0   Glucose, UA NEGATIVE NEGATIVE mg/dL   Hgb urine dipstick NEGATIVE NEGATIVE   Bilirubin Urine NEGATIVE NEGATIVE   Ketones, ur NEGATIVE NEGATIVE mg/dL   Protein, ur NEGATIVE NEGATIVE mg/dL   Urobilinogen, UA 0.2 0.0 - 1.0 mg/dL   Nitrite NEGATIVE NEGATIVE   Leukocytes, UA NEGATIVE NEGATIVE    MAU Course  Procedures None  MDM Discussed with Dr. Corinna Capra. Recommends EKG today, if normal patient may be discharged to follow-up in the office for further evaluation.   Reviewed EKG with Dr. Oletta Darter through [place]. He agrees no acute findings and recommends follow-up with OB.  Assessment and Plan  A: [facility] at [redacted]w[redacted]d Intermittent tachycardia  P: Discharge home Warning signs for worsening condition discussed Patient advised to follow-up with [facility] as scheduled or sooner PRN Patient may return to MAU as needed or if her condition were to change or worsen   Luvenia Redden, PA-C  11/11/2014, 9:20 PM

## 2014-11-11 NOTE — MAU Note (Addendum)
Between 1500-1600 my heart rate got up to 135 and then came down to 110 and then back to 135. Was on pulse ox at work. Was sweating really badly. My sugar was 92. I work at [employer]. I had just got to work so had not really done anything yet. Has had some vomiting this wk, including today. No bleeding or pain. About 59yrs ago when pregnant heart rate went up to 145. Never had a cardiology workup

## 2014-11-11 NOTE — Progress Notes (Addendum)
[facility] Progress Note Patient Name: Michelle Kane DOB: 11-11-1985 MRN: [id number]   Date of Service  11/11/2014  HPI/Events of Note  Asked to look at EKG obtained for Hx "tachycardia". EKG reveals NSR with rate = 94 and minimal voltage criteria for LVH which might be a normal variant. No evidence for acute myocardial injury. BP = 128/97.  eICU Interventions  Follow up with primary physician as an outpatient.      Intervention Category Intermediate Interventions: Diagnostic test evaluation  Buddie Marston Cornelia Copa 11/11/2014, 9:04 PM

## 2014-11-11 NOTE — Discharge Instructions (Signed)
Nonspecific Tachycardia  Tachycardia is a faster than normal heartbeat (more than 100 beats per minute). In adults, the heart normally beats between 60 and 100 times a minute. A fast heartbeat may be a normal response to exercise or stress. It does not necessarily mean that something is wrong. However, sometimes when your heart beats too fast it may not be able to pump enough blood to the rest of your body. This can result in chest pain, shortness of breath, dizziness, and even fainting. Nonspecific tachycardia means that the specific cause or pattern of your tachycardia is unknown.  CAUSES   Tachycardia may be harmless or it may be due to a more serious underlying cause. Possible causes of tachycardia include:  · Exercise or exertion.  · Fever.  · Pain or injury.  · Infection.  · Loss of body fluids (dehydration).  · Overactive thyroid.  · Lack of red blood cells (anemia).  · Anxiety and stress.  · Alcohol.  · Caffeine.  · Tobacco products.  · Diet pills.  · Illegal drugs.  · Heart disease.  SYMPTOMS  · Rapid or irregular heartbeat (palpitations).  · Suddenly feeling your heart beating (cardiac awareness).  · Dizziness.  · Tiredness (fatigue).  · Shortness of breath.  · Chest pain.  · Nausea.  · Fainting.  DIAGNOSIS   Your caregiver will perform a physical exam and take your medical history. In some cases, a heart specialist (cardiologist) may be consulted. Your caregiver may also order:  · Blood tests.  · Electrocardiography. This test records the electrical activity of your heart.  · A heart monitoring test.  TREATMENT   Treatment will depend on the likely cause of your tachycardia. The goal is to treat the underlying cause of your tachycardia. Treatment methods may include:  · Replacement of fluids or blood through an intravenous (IV) tube for moderate to severe dehydration or anemia.  · New medicines or changes in your current medicines.  · Diet and lifestyle changes.  · Treatment for certain  infections.  · Stress relief or relaxation methods.  HOME CARE INSTRUCTIONS   · Rest.  · Drink enough fluids to keep your urine clear or pale yellow.  · Do not smoke.  · Avoid:  ¨ Caffeine.  ¨ Tobacco.  ¨ Alcohol.  ¨ Chocolate.  ¨ Stimulants such as over-the-counter diet pills or pills that help you stay awake.  ¨ Situations that cause anxiety or stress.  ¨ Illegal drugs such as marijuana, phencyclidine (PCP), and cocaine.  · Only take medicine as directed by your caregiver.  · Keep all follow-up appointments as directed by your caregiver.  SEEK IMMEDIATE MEDICAL CARE IF:   · You have pain in your chest, upper arms, jaw, or neck.  · You become weak, dizzy, or feel faint.  · You have palpitations that will not go away.  · You vomit, have diarrhea, or pass blood in your stool.  · Your skin is cool, pale, and wet.  · You have a fever that will not go away with rest, fluids, and medicine.  MAKE SURE YOU:   · Understand these instructions.  · Will watch your condition.  · Will get help right away if you are not doing well or get worse.  Document Released: 08/08/2004 Document Revised: 09/23/2011 Document Reviewed: 06/11/2011  Exit[employer]® Patient Information ©2015 [employer]. This information is not intended to replace advice given to you by your health care provider. Make sure you discuss any questions   you have with your health care provider.

## 2014-12-22 ENCOUNTER — Ambulatory Visit: Payer: 59 | Admitting: Cardiology

## 2015-01-10 ENCOUNTER — Inpatient Hospital Stay (HOSPITAL_COMMUNITY)
Admission: AD | Admit: 2015-01-10 | Discharge: 2015-01-11 | Disposition: A | Discharge status: Home / Self Care | Payer: 59 | Source: Ambulatory Visit | Attending: Obstetrics and Gynecology | Admitting: Obstetrics and Gynecology

## 2015-01-10 ENCOUNTER — Encounter (HOSPITAL_COMMUNITY): Payer: Self-pay | Admitting: *Deleted

## 2015-01-10 DIAGNOSIS — Z3A16 16 weeks gestation of pregnancy: Secondary | ICD-10-CM | POA: Insufficient documentation

## 2015-01-10 DIAGNOSIS — O99212 Obesity complicating pregnancy, second trimester: Secondary | ICD-10-CM | POA: Insufficient documentation

## 2015-01-10 DIAGNOSIS — O26893 Other specified pregnancy related conditions, third trimester: Secondary | ICD-10-CM | POA: Insufficient documentation

## 2015-01-10 DIAGNOSIS — Z6791 Unspecified blood type, Rh negative: Secondary | ICD-10-CM | POA: Diagnosis not present

## 2015-01-10 DIAGNOSIS — O209 Hemorrhage in early pregnancy, unspecified: Secondary | ICD-10-CM | POA: Diagnosis present

## 2015-01-10 DIAGNOSIS — R109 Unspecified abdominal pain: Secondary | ICD-10-CM

## 2015-01-10 DIAGNOSIS — O360111 Maternal care for anti-D [Rh] antibodies, first trimester, fetus 1: Secondary | ICD-10-CM

## 2015-01-10 DIAGNOSIS — O26899 Other specified pregnancy related conditions, unspecified trimester: Secondary | ICD-10-CM | POA: Insufficient documentation

## 2015-01-10 LAB — CBC WITH DIFFERENTIAL/PLATELET
BASOS PCT: 0 % (ref 0–1)
Basophils Absolute: 0 10*3/uL (ref 0.0–0.1)
EOS ABS: 0.3 10*3/uL (ref 0.0–0.7)
Eosinophils Relative: 3 % (ref 0–5)
HCT: 31.4 % — ABNORMAL LOW (ref 36.0–46.0)
Hemoglobin: 11.1 g/dL — ABNORMAL LOW (ref 12.0–15.0)
Lymphocytes Relative: 23 % (ref 12–46)
Lymphs Abs: 2.1 10*3/uL (ref 0.7–4.0)
MCH: 31.6 pg (ref 26.0–34.0)
MCHC: 35.4 g/dL (ref 30.0–36.0)
MCV: 89.5 fL (ref 78.0–100.0)
Monocytes Absolute: 0.6 10*3/uL (ref 0.1–1.0)
Monocytes Relative: 6 % (ref 3–12)
NEUTROS PCT: 68 % (ref 43–77)
Neutro Abs: 6.1 10*3/uL (ref 1.7–7.7)
PLATELETS: 168 10*3/uL (ref 150–400)
RBC: 3.51 MIL/uL — ABNORMAL LOW (ref 3.87–5.11)
RDW: 13.1 % (ref 11.5–15.5)
WBC: 9.1 10*3/uL (ref 4.0–10.5)

## 2015-01-10 LAB — URINALYSIS, ROUTINE W REFLEX MICROSCOPIC
BILIRUBIN URINE: NEGATIVE
Glucose, UA: NEGATIVE mg/dL
Ketones, ur: NEGATIVE mg/dL
Leukocytes, UA: NEGATIVE
Nitrite: NEGATIVE
PH: 6 (ref 5.0–8.0)
Protein, ur: NEGATIVE mg/dL
Specific Gravity, Urine: 1.01 (ref 1.005–1.030)
Urobilinogen, UA: 0.2 mg/dL (ref 0.0–1.0)

## 2015-01-10 LAB — URINE MICROSCOPIC-ADD ON: WBC, UA: NONE SEEN WBC/hpf (ref <3)

## 2015-01-10 MED ORDER — RHO D IMMUNE GLOBULIN 1500 UNIT/2ML IJ SOSY
300.0000 ug | PREFILLED_SYRINGE | Freq: Once | INTRAMUSCULAR | Status: AC
  Administered 2015-01-11: 300 ug via INTRAMUSCULAR
  Filled 2015-01-10: qty 2

## 2015-01-10 NOTE — MAU Note (Signed)
PT  SAYS SHE HAS PAIN IN HER UPPER  ABD   AND  RIGHT  LOWER ABD  AND  BACK.   HAS  HAD  CRAMPING  X1  WEEK.       VAG  BLEEDING  STARTED    AT 930-  WHILE  SHE  WAS  VOIDING-  SAW BLOOD  DRIP INTO  TOILET   AND   THEN WHEN  SHE  WIPED.    NO PAD ON  IN TRIAGE.     NO MEDS   FOR  CRAMPING.  LAST   SEX-     1 WEEK  AGO.     [place]

## 2015-01-11 ENCOUNTER — Encounter (HOSPITAL_COMMUNITY): Payer: Self-pay

## 2015-01-11 ENCOUNTER — Inpatient Hospital Stay (HOSPITAL_COMMUNITY): Payer: 59

## 2015-01-11 DIAGNOSIS — Z3A16 16 weeks gestation of pregnancy: Secondary | ICD-10-CM | POA: Insufficient documentation

## 2015-01-11 DIAGNOSIS — R109 Unspecified abdominal pain: Secondary | ICD-10-CM

## 2015-01-11 DIAGNOSIS — O99212 Obesity complicating pregnancy, second trimester: Secondary | ICD-10-CM | POA: Insufficient documentation

## 2015-01-11 DIAGNOSIS — O360111 Maternal care for anti-D [Rh] antibodies, first trimester, fetus 1: Secondary | ICD-10-CM

## 2015-01-11 DIAGNOSIS — O26899 Other specified pregnancy related conditions, unspecified trimester: Secondary | ICD-10-CM | POA: Insufficient documentation

## 2015-01-11 DIAGNOSIS — O209 Hemorrhage in early pregnancy, unspecified: Secondary | ICD-10-CM | POA: Insufficient documentation

## 2015-01-11 LAB — WET PREP, GENITAL
Trich, Wet Prep: NONE SEEN
Yeast Wet Prep HPF POC: NONE SEEN

## 2015-01-11 LAB — GC/CHLAMYDIA PROBE AMP (~~LOC~~) NOT AT ARMC
Chlamydia: NEGATIVE
Neisseria Gonorrhea: NEGATIVE

## 2015-01-11 MED ORDER — METRONIDAZOLE 500 MG PO TABS: 500.0000 mg | ORAL_TABLET | Freq: Two times a day (BID) | ORAL | Status: DC

## 2015-01-11 NOTE — Discharge Instructions (Signed)
Bacterial Vaginosis Bacterial vaginosis is an infection of the vagina. It happens when too many of certain germs (bacteria) grow in the vagina. HOME CARE  Take your medicine as told by your doctor.  Finish your medicine even if you start to feel better.  Do not have sex until you finish your medicine and are better.  Tell your sex partner that you have an infection. They should see their doctor for treatment.  Practice safe sex. Use condoms. Have only one sex partner. GET HELP IF:  You are not getting better after 3 days of treatment.  You have more grey fluid (discharge) coming from your vagina than before.  You have more pain than before.  You have a fever. MAKE SURE YOU:   Understand these instructions.  Will watch your condition.  Will get help right away if you are not doing well or get worse. Document Released: 04/09/2008 Document Revised: 04/21/2013 Document Reviewed: 02/10/2013 [facility] Patient Information 2015 [place], [place]. This information is not intended to replace advice given to you by your health care provider. Make sure you discuss any questions you have with your health care provider. Abdominal Pain During Pregnancy Belly (abdominal) pain is common during pregnancy. Most of the time, it is not a serious problem. Other times, it can be a sign that something is wrong with the pregnancy. Always tell your doctor if you have belly pain. HOME CARE Monitor your belly pain for any changes. The following actions may help you feel better:  Do not have sex (intercourse) or put anything in your vagina until you feel better.  Rest until your pain stops.  Drink clear fluids if you feel sick to your stomach (nauseous). Do not eat solid food until you feel better.  Only take medicine as told by your doctor.  Keep all doctor visits as told. GET HELP RIGHT AWAY IF:   You are bleeding, leaking fluid, or pieces of tissue come out of your vagina.  You have more pain or  cramping.  You keep throwing up (vomiting).  You have pain when you pee (urinate) or have blood in your pee.  You have a fever.  You do not feel your baby moving as much.  You feel very weak or feel like passing out.  You have trouble breathing, with or without belly pain.  You have a very bad headache and belly pain.  You have fluid leaking from your vagina and belly pain.  You keep having watery poop (diarrhea).  Your belly pain does not go away after resting, or the pain gets worse. MAKE SURE YOU:   Understand these instructions.  Will watch your condition.  Will get help right away if you are not doing well or get worse. Document Released: 06/19/2009 Document Revised: 03/03/2013 Document Reviewed: 01/28/2013 [facility] Patient Information 2015 [place], [place]. This information is not intended to replace advice given to you by your health care provider. Make sure you discuss any questions you have with your health care provider.

## 2015-01-11 NOTE — MAU Provider Note (Signed)
History     CSN: [id number]  Arrival date and time: 01/10/15 2202   First Provider Initiated Contact with Patient 01/11/15 0221      No chief complaint on file.  HPI Ms. Michelle Kane is a 29 y.o. [phone] at [redacted]w[redacted]d who presents to MAU today with complaint of vaginal bleeding and abdominal pain. The patient states abdominal pain has been present and the same x weeks. She states that it is cramping and now radiating to her lower back as well. 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 She denies UTI symptoms, abnormal vaginal discharge or complications with the pregnancy. She states last intercourse was > 1 weeks ago. She also states that she noted some blood when she went to the bathroom tonight. She states that there was a small amount in the toilet and then noted with wiping. She has not required a pad.   OB History    Gravida Para Term Preterm AB TAB SAB Ectopic Multiple Living   4 2 2  1  1   2       Past Medical History  Diagnosis Date  . GERD (gastroesophageal reflux disease)   . Allergy-induced asthma   . 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 History of ovarian cyst   . Pelvic pain in female   . History of colon polyps     Past Surgical History  Procedure Laterality Date  . Cesarean section  03-01-2008  . Im pinning right elbow fx  [place]  . Laparoscopy N/A 07/29/2014    Procedure: LAPAROSCOPY DIAGNOSTIC;  Surgeon: Cyril Mourning, MD;  Location: [facility];  Service: Gynecology;  Laterality: N/A;    Family History  Problem Relation Age of Onset  . Hyperlipidemia Mother   . Crohn's disease Mother   . Colon polyps Mother   . Hyperlipidemia Father   . Hemophilia Father   . Hemophilia Son     History  Substance Use Topics  . Smoking status: Current Every Day Smoker -- 0.50 packs/day for 10 years    Types: Cigarettes  . Smokeless tobacco: Never Used  . Alcohol Use: No     Comment: social    Allergies:   Allergies  Allergen Reactions  . Vicodin [Hydrocodone-Acetaminophen] Itching and Nausea And Vomiting    Prescriptions prior to admission  Medication Sig Dispense Refill Last Dose  . albuterol (PROVENTIL) (2.5 MG/3ML) 0.083% nebulizer solution Take 2.5 mg by nebulization every 6 (six) hours as needed for wheezing or shortness of breath.   01/10/2015 at Unknown time  . famotidine-calcium carbonate-magnesium hydroxide (PEPCID COMPLETE) 10-800-165 MG CHEW chewable tablet Chew 2 tablets by mouth daily as needed (heartburn).   01/10/2015 at Unknown time  . Prenatal Vit-Fe Fumarate-FA (PRENATAL MULTIVITAMIN) TABS tablet Take 1 tablet by mouth daily at 12 noon.   01/10/2015 at Unknown time  . fexofenadine (ALLEGRA) 180 MG tablet Take 180 mg by mouth daily.   More than a month at Unknown time  . PROAIR HFA 108 (90 BASE) MCG/ACT inhaler Inhale 1-2 puffs into the lungs every 4 (four) hours as needed for wheezing or shortness of breath. 1 Inhaler 1 Unknown at Unknown time    Review of Systems  Constitutional: Negative for fever and malaise/fatigue.  Gastrointestinal: Positive for nausea and abdominal pain. Negative for vomiting, diarrhea and constipation.  Genitourinary: Negative for dysuria, urgency and frequency.       + vaginal bleeding Neg -  vaginal discharge  Musculoskeletal: Positive for back pain.   Physical Exam   Blood pressure 108/69, pulse 100, temperature 98 F (36.7 C), temperature source Oral, resp. rate 20, height 5\' 5"  (1.651 m), weight 242 lb (109.77 kg), last menstrual period 09/18/2014.  Physical Exam  Nursing note and vitals reviewed. Constitutional: She is oriented to person, place, and time. She appears well-developed and well-nourished. No distress.  HENT:  Head: Normocephalic and atraumatic.  Cardiovascular: Normal rate.   Respiratory: Effort normal.  GI: Soft. She exhibits no distension and no mass. There is no tenderness. There is no rebound and no guarding.   Genitourinary: Uterus is enlarged. Uterus is not tender. Cervix exhibits no motion tenderness, no discharge and no friability. There is bleeding (scant brown) in the vagina. Vaginal discharge (small amount of thin, brown discharge noted) found.  Cervix: closed, thick, posterior  Neurological: She is alert and oriented to person, place, and time.  Skin: Skin is warm and dry. No erythema.  Psychiatric: She has a normal mood and affect.   Results for orders placed or performed during the hospital encounter of 01/10/15 (from the past 24 hour(s))  Urinalysis, Routine w reflex microscopic (not at [facility])     Status: Abnormal   Collection Time: 01/10/15 10:25 PM  Result Value Ref Range   Color, Urine YELLOW YELLOW   APPearance CLEAR CLEAR   Specific Gravity, Urine 1.010 1.005 - 1.030   pH 6.0 5.0 - 8.0   Glucose, UA NEGATIVE NEGATIVE mg/dL   Hgb urine dipstick TRACE (A) NEGATIVE   Bilirubin Urine NEGATIVE NEGATIVE   Ketones, ur NEGATIVE NEGATIVE mg/dL   Protein, ur NEGATIVE NEGATIVE mg/dL   Urobilinogen, UA 0.2 0.0 - 1.0 mg/dL   Nitrite NEGATIVE NEGATIVE   Leukocytes, UA NEGATIVE NEGATIVE  Urine microscopic-add on     Status: Abnormal   Collection Time: 01/10/15 10:25 PM  Result Value Ref Range   Squamous Epithelial / LPF RARE RARE   WBC, UA  <3 WBC/hpf    NO FORMED ELEMENTS SEEN ON URINE MICROSCOPIC EXAMINATION   RBC / HPF 0-2 <3 RBC/hpf   Bacteria, UA FEW (A) RARE  CBC with Differential/Platelet     Status: Abnormal   Collection Time: 01/10/15 10:52 PM  Result Value Ref Range   WBC 9.1 4.0 - 10.5 K/uL   RBC 3.51 (L) 3.87 - 5.11 MIL/uL   Hemoglobin 11.1 (L) 12.0 - 15.0 g/dL   HCT 31.4 (L) 36.0 - 46.0 %   MCV 89.5 78.0 - 100.0 fL   MCH 31.6 26.0 - 34.0 pg   MCHC 35.4 30.0 - 36.0 g/dL   RDW 13.1 11.5 - 15.5 %   Platelets 168 150 - 400 K/uL   Neutrophils Relative % 68 43 - 77 %   Neutro Abs 6.1 1.7 - 7.7 K/uL   Lymphocytes Relative 23 12 - 46 %   Lymphs Abs 2.1 0.7 - 4.0 K/uL    Monocytes Relative 6 3 - 12 %   Monocytes Absolute 0.6 0.1 - 1.0 K/uL   Eosinophils Relative 3 0 - 5 %   Eosinophils Absolute 0.3 0.0 - 0.7 K/uL   Basophils Relative 0 0 - 1 %   Basophils Absolute 0.0 0.0 - 0.1 K/uL  Rh IG workup (includes ABO/Rh)     Status: None (Preliminary result)   Collection Time: 01/10/15 10:52 PM  Result Value Ref Range   Gestational Age(Wks) 16    ABO/RH(D) A NEG    Antibody  Screen NEG    Unit Number [id number]    Blood Component Type RHIG    Unit division 00    Status of Unit ISSUED    Transfusion Status OK TO TRANSFUSE   Wet prep, genital     Status: Abnormal   Collection Time: 01/11/15  2:30 AM  Result Value Ref Range   Yeast Wet Prep HPF POC NONE SEEN NONE SEEN   Trich, Wet Prep NONE SEEN NONE SEEN   Clue Cells Wet Prep HPF POC FEW (A) NONE SEEN   WBC, Wet Prep HPF POC MODERATE (A) NONE SEEN    MAU Course  Procedures None  MDM UA, CBC, wet prep, GC/Chlamydia and [place] today Rhogam work-up and Rhophylac injection ordered Preliminary [place] report showed normal cervical length, no evidence of previa or abruption Discussed patient with Dr. Gaetano Net. He agrees with plan for discharge at this time and follow-up in the office.   Assessment and Plan  A: SIUP at [redacted]w[redacted]d Vaginal bleeding in pregnancy Round ligament pain Rh negative  P: Discharge home Bleeding precautions discussed Discussed use of Tylenol and abdominal binder for abdominal and back pain Patient advised to follow-up with [facility] as scheduled for routine prenatal care or sooner if symptoms worsen Patient may return to MAU as needed or if her condition were to change or worsen   Luvenia Redden, PA-C  01/11/2015, 3:53 AM

## 2015-01-12 ENCOUNTER — Ambulatory Visit (HOSPITAL_COMMUNITY): Payer: 59 | Attending: Cardiology

## 2015-01-12 ENCOUNTER — Ambulatory Visit (INDEPENDENT_AMBULATORY_CARE_PROVIDER_SITE_OTHER): Payer: 59 | Admitting: Cardiology

## 2015-01-12 ENCOUNTER — Ambulatory Visit (INDEPENDENT_AMBULATORY_CARE_PROVIDER_SITE_OTHER): Payer: 59

## 2015-01-12 ENCOUNTER — Encounter: Payer: Self-pay | Admitting: Cardiology

## 2015-01-12 VITALS — BP 136/78 | HR 93 | Ht 65.0 in | Wt 241.0 lb

## 2015-01-12 DIAGNOSIS — R002 Palpitations: Secondary | ICD-10-CM | POA: Insufficient documentation

## 2015-01-12 DIAGNOSIS — Z8249 Family history of ischemic heart disease and other diseases of the circulatory system: Secondary | ICD-10-CM

## 2015-01-12 DIAGNOSIS — I517 Cardiomegaly: Secondary | ICD-10-CM | POA: Diagnosis not present

## 2015-01-12 DIAGNOSIS — Z3A16 16 weeks gestation of pregnancy: Secondary | ICD-10-CM

## 2015-01-12 DIAGNOSIS — R931 Abnormal findings on diagnostic imaging of heart and coronary circulation: Secondary | ICD-10-CM | POA: Insufficient documentation

## 2015-01-12 DIAGNOSIS — R079 Chest pain, unspecified: Secondary | ICD-10-CM | POA: Diagnosis present

## 2015-01-12 DIAGNOSIS — R072 Precordial pain: Secondary | ICD-10-CM

## 2015-01-12 DIAGNOSIS — R0602 Shortness of breath: Secondary | ICD-10-CM | POA: Diagnosis not present

## 2015-01-12 DIAGNOSIS — I340 Nonrheumatic mitral (valve) insufficiency: Secondary | ICD-10-CM | POA: Insufficient documentation

## 2015-01-12 DIAGNOSIS — I429 Cardiomyopathy, unspecified: Secondary | ICD-10-CM

## 2015-01-12 DIAGNOSIS — I428 Other cardiomyopathies: Secondary | ICD-10-CM

## 2015-01-12 LAB — RH IG WORKUP (INCLUDES ABO/RH)
ABO/RH(D): A NEG
Antibody Screen: NEGATIVE
Gestational Age(Wks): 16
Unit division: 0

## 2015-01-12 LAB — TSH: TSH: 1.5 u[IU]/mL (ref 0.35–4.50)

## 2015-01-12 NOTE — Patient Instructions (Addendum)
Medication Instructions:   Your physician recommends that you continue on your current medications as directed. Please refer to the Current Medication list given to you today.'    Labwork:  TODAY--CHECK TSH    Testing/Procedures:  Your physician has requested that you have an echocardiogram. Echocardiography is a painless test that uses sound waves to create images of your heart. It provides your doctor with information about the size and shape of your heart and how well your heart's chambers and valves are working. This procedure takes approximately one hour. There are no restrictions for this procedure.  PER DR NELSON YOU WILL HAVE THIS DONE TODAY    Your physician has recommended that you wear a 24 HOUR holter monitor. Holter monitors are medical devices that record the heart's electrical activity. Doctors most often use these monitors to diagnose arrhythmias. Arrhythmias are problems with the speed or rhythm of the heartbeat. The monitor is a small, portable device. You can wear one while you do your normal daily activities. This is usually used to diagnose what is causing palpitations/syncope (passing out).      Follow-Up:   AT DR NELSON'S NEXT AVAILABLE    Your physician has requested that you have an echocardiogram. Echocardiography is a painless test that uses sound waves to create images of your heart. It provides your doctor with information about the size and shape of your heart and how well your heart's chambers and valves are working. This procedure takes approximately one hour. There are no restrictions for this procedure.  PER DR NELSON YOU WILL NEED ANOTHER ECHO SCHEDULED IN EARLY AUGUST 2016 FOR NOTED LOW EJECTION FRACTION ON THE ECHO PERFORMED TODAY.

## 2015-01-12 NOTE — Progress Notes (Signed)
Patient ID: PAYSLEY POPLAR, female   DOB: 1985/11/05, 29 y.o.   MRN: [id number]      Cardiology Office Note   Date:  01/12/2015   ID:  Corbin, Hott 06-13-1986, MRN [id number]  PCP:  Arnette Norris, MD  Cardiologist:  [name], MD   Chief complain: Chest pain, SOB, palpitations   History of Present Illness: Michelle Kane is a 29 y.o. female, G3P2 in 17th week of her pregnancy who has developed palpitations about a month ago. She states that she is tachycardic with minimal activity and it is associated with dizziness on occasions, CO and SOB. No syncope. She can also develop chest pain at rest mostly sharp, left sided radiating to her left arm, also sometimes on exertion. 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 No LE edema, no orthopnea, no PND. FH of premature CAD, grandfather had MI, CABG in his 91', GGF died of CHF. Her bother and uncle also have dilated cardiomyopathy.  She has no prior cardiac history. She has had two prior pregnancies (ages 55, 2) with similar symptoms and no complications. She has no h/o HTN, HLP, thyroid problem.   Past Medical History  Diagnosis Date  . GERD (gastroesophageal reflux disease)   . Allergy-induced asthma   . 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 History of ovarian cyst   . Pelvic pain in female   . History of colon polyps     Past Surgical History  Procedure Laterality Date  . Cesarean section  03-01-2008  . Im pinning right elbow fx  [place]  . Laparoscopy N/A 07/29/2014    Procedure: LAPAROSCOPY DIAGNOSTIC;  Surgeon: Cyril Mourning, MD;  Location: [facility];  Service: Gynecology;  Laterality: N/A;     Current Outpatient Prescriptions  Medication Sig Dispense Refill  . albuterol (PROVENTIL) (2.5 MG/3ML) 0.083% nebulizer solution Take 2.5 mg by nebulization every 6 (six) hours as needed for  wheezing or shortness of breath.    . famotidine-calcium carbonate-magnesium hydroxide (PEPCID COMPLETE) 10-800-165 MG CHEW chewable tablet Chew 2 tablets by mouth daily as needed (heartburn).    . metroNIDAZOLE (FLAGYL) 500 MG tablet Take 1 tablet (500 mg total) by mouth 2 (two) times daily. 14 tablet 0  . Prenatal Vit-Fe Fumarate-FA (PRENATAL MULTIVITAMIN) TABS tablet Take 1 tablet by mouth daily at 12 noon.    Marland Kitchen PROAIR HFA 108 (90 BASE) MCG/ACT inhaler Inhale 1-2 puffs into the lungs every 4 (four) hours as needed for wheezing or shortness of breath. 1 Inhaler 1   No current facility-administered medications for this visit.    Allergies:   Vicodin    Social History:  The patient  reports that she has been smoking Cigarettes.  She has a 5 pack-year smoking history. She has never used smokeless tobacco. She reports that she does not drink alcohol or use illicit drugs.   Family History:  The patient's family history includes Colon polyps in her mother; Crohn's disease in her mother; Hemophilia in her father and son; Hyperlipidemia in her father and mother.    ROS:  Please see the history of present illness.   Otherwise, review of systems are positive for none.   All other systems are reviewed and negative.    PHYSICAL EXAM: VS:  BP 136/78 mmHg  Pulse 93  Ht 5\' 5"  (1.651  m)  Wt 241 lb (109.317 kg)  BMI 40.10 kg/m2  LMP 09/18/2014 , BMI Body mass index is 40.1 kg/(m^2). GEN: Well nourished, well developed, in no acute distress HEENT: normal Neck: no JVD, carotid bruits, or masses Cardiac: RRR; no murmurs, rubs, or gallops,no edema  Respiratory:  clear to auscultation bilaterally, normal work of breathing GI: soft, nontender, nondistended, + BS MS: no deformity or atrophy Skin: warm and dry, no rash Neuro:  Strength and sensation are intact Psych: euthymic mood, full affect  EKG:  EKG is ordered today. The ekg ordered today demonstrates SR, 95 BPM, LVH  Recent Labs: 05/17/2014:  ALT 18; BUN 9; Creatinine, Ser 0.7; Potassium 3.7; Sodium 137; TSH 0.88 01/10/2015: Hemoglobin 11.1*; Platelets 168   Lipid Panel    Component Value Date/Time   CHOL 197 05/17/2014 0946   TRIG 175.0* 05/17/2014 0946   HDL 37.60* 05/17/2014 0946   CHOLHDL 5 05/17/2014 0946   VLDL 35.0 05/17/2014 0946   LDLCALC 124* 05/17/2014 0946    Wt Readings from Last 3 Encounters:  01/12/15 241 lb (109.317 kg)  01/10/15 242 lb (109.77 kg)  11/11/14 235 lb 6.4 oz (106.777 kg)     ASSESSMENT AND PLAN:  29 year old female G3P2 in 17th week of pregnancy  1. Palpitations - obtain echo today, obtain Holter monitor, check TSH today, normal in 05/2014, if she has persistent sinus tachycardia or other arrhythmias we will consider labetalol as it is safe in pregnancy.  2. Chest pain - rather atypical - follow echo, no further testing in pregnancy, unless abnormal echo  3. Hyperlipidemia - we will recheck post pregnancy, diet only for now.   Signed, [name], MD  01/12/2015 10:12 AM    [address], [place], [place]  [address] Phone: [phone]; Fax: [phone]   Addendum:  The patient underwent echocardiogram that shows LVEDD 55 mm - upper normal, possibly ok during pregnancy as the total intravascular volume is increased. However her LVEF is mildly reduced, estimated at 45-50%.  The major hemodynamic changes of pregnancy occur in the second trimester. Considering she has significant FH of dilated cardiomyopathy this might her presentation precipitated by pregnancy, that is well known risk factor. She appears euvolemic now. We will monitor her closely, follow up visit in 6 weeks with repeat echocardiogram. We are limited in use of ACEI/ARB as those are contraindicated in pregnancy.  She should avoid strenuous work or lifting objects heavier than 10 lbs.   [name] 01/12/2015

## 2015-01-20 ENCOUNTER — Telehealth: Payer: Self-pay | Admitting: Cardiology

## 2015-01-20 MED ORDER — METOPROLOL SUCCINATE ER 25 MG PO TB24: 25.0000 mg | ORAL_TABLET | Freq: Every day | ORAL | Status: DC

## 2015-01-20 NOTE — Telephone Encounter (Signed)
[name], MD 01/19/2015 Routine         Narrative & Impression         Persistent sinus tachycardia, no other arrhythmias.  We will start Toprol XL 25 mg po daily as the patient has mild systolic dysfunction.         Above mentioned is the pts 24 hour holter monitor results per Dr Meda Coffee. Informed the pt of these results and recommendations per Dr Meda Coffee for the pt to take Toprol xl 25 mg po daily.  Confirmed the pharmacy of choice with the pt.  Pt states she is going to see her OBGYN next week for her pregnancy check-up, and she would like this information to be sent to him for review.  Pt states she will drop by the office today and fill out a medical release form for our office to send this information to her OBGYN.  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  Pt states she does a lot of heavy lifting and bending while caring for her pts.   Pt states that she has paid disability for her pregnancy, and if her OBGYN and Dr Meda Coffee feel like she needs to utilize this, then she will request a work excuse letter from both Providers.  Informed the pt that would be fine, to update myself and Dr Meda Coffee on what her OBGYN thinks about her occupation and cardiac issues.  Informed the pt we will assist her based on what her OB states.  Did inform the pt that Dr Meda Coffee will be out of the office next week, but I will route this message to her for further review of her occupation, being pregnant, and having cardiac issues.  Pt verbalized understanding and agrees with this plan.

## 2015-01-20 NOTE — Telephone Encounter (Signed)
Left message for the pt to call back.

## 2015-01-20 NOTE — Telephone Encounter (Signed)
-----   Message from [name], MD sent at 01/19/2015  4:30 PM EDT -----   ----- Message -----    From: [name], MD    Sent: 01/19/2015   4:15 PM      To: [name], MD

## 2015-01-20 NOTE — Telephone Encounter (Signed)
New problem ° ° °Pt returning your call from yesterday. °

## 2015-01-20 NOTE — Telephone Encounter (Signed)
Also informed the pt that Dr Meda Coffee confirmed with our PharmD Elberta Leatherwood, that taking toprol xl 25 mg po daily is safe to take in pregnancy. Pt verbalized understanding.

## 2015-01-22 NOTE — Telephone Encounter (Signed)
Yes, she shouldn't lift anything heavier than 10 lbs. 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

## 2015-01-23 ENCOUNTER — Telehealth: Payer: Self-pay | Admitting: Cardiology

## 2015-01-23 NOTE — Telephone Encounter (Signed)
error 

## 2015-01-24 ENCOUNTER — Encounter: Payer: Self-pay | Admitting: *Deleted

## 2015-01-24 NOTE — Telephone Encounter (Signed)
Follow up      Returning Michelle Kane's call

## 2015-01-24 NOTE — Telephone Encounter (Signed)
Pt calling back to give permission and request that our office send her workplace the restrictions per Dr Meda Coffee for the pt to lift no more than 10 lbs, including lifting patients.  Patient requesting the work note be sent to [employer] in [place] Attention to:  Hulan Fess DON at fax number [phone] and telephone number [phone].  Pt requesting this be faxed to her DON today, for she is expecting this.  Informed the pt that with her given permission I will fax the work note that specifically says per Dr Meda Coffee that the pt shouldn't lift anything heavier than 10 lbs. 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 Pt verbalized understanding and gracious for all the assistance provided.

## 2015-01-24 NOTE — Telephone Encounter (Signed)
Left message for the pt to call back to inform her of Dr Francesca Oman recommendations mentioned below of lifting restrictions while the pt is pregnant and being under the care of Cardiology.

## 2015-01-24 NOTE — Telephone Encounter (Signed)
Left detailed message on pts confirmed VM that I spoke with Maudie Mercury in Medical Records and she received her release form that she signed in our office last week to send Dr Thera Flake last OV note and full work-up results to her OBGYN as requested.  Left message that Maudie Mercury will be sending all this information to the pts OBGYN today and will also send this telephone encounter along with the pieces of documentation from our office to inform her OBGYN that the pt should not be lifting no more than 10 lbs, along with no lifting of patients per Dr Meda Coffee.  Left the pt a message to call back if her OBGYN did not receive requested information sent.

## 2015-01-24 NOTE — Telephone Encounter (Signed)
Left message to call back  

## 2015-02-16 ENCOUNTER — Ambulatory Visit (HOSPITAL_COMMUNITY): Payer: 59 | Attending: Cardiology

## 2015-02-16 ENCOUNTER — Other Ambulatory Visit: Payer: Self-pay

## 2015-02-16 DIAGNOSIS — E785 Hyperlipidemia, unspecified: Secondary | ICD-10-CM | POA: Diagnosis not present

## 2015-02-16 DIAGNOSIS — I517 Cardiomegaly: Secondary | ICD-10-CM | POA: Diagnosis not present

## 2015-02-16 DIAGNOSIS — R0602 Shortness of breath: Secondary | ICD-10-CM | POA: Diagnosis not present

## 2015-02-16 DIAGNOSIS — R002 Palpitations: Secondary | ICD-10-CM | POA: Diagnosis not present

## 2015-02-16 DIAGNOSIS — F17200 Nicotine dependence, unspecified, uncomplicated: Secondary | ICD-10-CM | POA: Insufficient documentation

## 2015-02-16 DIAGNOSIS — R931 Abnormal findings on diagnostic imaging of heart and coronary circulation: Secondary | ICD-10-CM | POA: Diagnosis not present

## 2015-02-16 DIAGNOSIS — Z3A16 16 weeks gestation of pregnancy: Secondary | ICD-10-CM | POA: Diagnosis not present

## 2015-02-17 ENCOUNTER — Telehealth: Payer: Self-pay | Admitting: *Deleted

## 2015-02-17 MED ORDER — LABETALOL HCL 100 MG PO TABS: 100.0000 mg | ORAL_TABLET | Freq: Two times a day (BID) | ORAL | Status: DC

## 2015-02-17 NOTE — Telephone Encounter (Signed)
Informed the pt that per Dr Meda Coffee she would like her to d/c her toprol xl and switch her to labetalol 100 mg po bid, and see if that helps with her complaints of sinus tachycardia in the afternoons during her pregnancy. Confirmed the pharmacy of choice with the pt.  Pt verbalized understanding and agrees with this plan.

## 2015-02-17 NOTE — Telephone Encounter (Signed)
-----   Message from [name], MD sent at 02/17/2015 12:58 PM EDT ----- Please switch her to labetalol 100 mg po BID and see if that helps.

## 2015-02-23 ENCOUNTER — Ambulatory Visit (INDEPENDENT_AMBULATORY_CARE_PROVIDER_SITE_OTHER): Payer: 59 | Admitting: Cardiology

## 2015-02-23 ENCOUNTER — Encounter: Payer: Self-pay | Admitting: Cardiology

## 2015-02-23 VITALS — BP 116/70 | HR 95 | Ht 66.75 in | Wt 243.0 lb

## 2015-02-23 DIAGNOSIS — R002 Palpitations: Secondary | ICD-10-CM

## 2015-02-23 DIAGNOSIS — R072 Precordial pain: Secondary | ICD-10-CM | POA: Diagnosis not present

## 2015-02-23 DIAGNOSIS — I5022 Chronic systolic (congestive) heart failure: Secondary | ICD-10-CM | POA: Diagnosis not present

## 2015-02-23 DIAGNOSIS — O162 Unspecified maternal hypertension, second trimester: Secondary | ICD-10-CM

## 2015-02-23 DIAGNOSIS — I519 Heart disease, unspecified: Secondary | ICD-10-CM

## 2015-02-23 NOTE — Patient Instructions (Signed)
Medication Instructions:   Your physician recommends that you continue on your current medications as directed. Please refer to the Current Medication list given to you today.        Follow-Up:  SCHEDULE APPOINTMENT WITH DR Meda Coffee ON 04/13/15 AT 12 PM.  OK TO USE THIS SPOT PER DR NELSON.

## 2015-02-23 NOTE — Progress Notes (Signed)
Patient ID: Michelle Kane, female   DOB: [birth year], 28 y.o.   MRN: [id number]      Cardiology Office Note  Date:  02/23/2015   ID:  Michelle Kane, DOB 1986/02/23, MRN [id number]  PCP:  Arnette Norris, MD  Cardiologist:  [name], MD   Chief complain: Chest pain, SOB, palpitations   History of Present Illness: Michelle Kane is a 29 y.o. female, G3P2 in 17th week of her pregnancy who has developed palpitations about a month ago. She states that she is tachycardic with minimal activity and it is associated with dizziness on occasions, CO and SOB. No syncope. She can also develop chest pain at rest mostly sharp, left sided radiating to her left arm, also sometimes on exertion. 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 No LE edema, no orthopnea, no PND. FH of premature CAD, grandfather had MI, CABG in his 59', GGF died of CHF. Her bother and uncle also have dilated cardiomyopathy.  She has no prior cardiac history. She has had two prior pregnancies (ages 42, 79) with similar symptoms and no complications. She has no h/o HTN, HLP, thyroid problem.  02/23/2015 - the patient is now [redacted] weeks pregnant, she feels tired all the time and has DOE, no orthopnea or PND. She has dependent LE edema at the end of the day that resolves by the next morning.  Her palpitations are significantly improved, chest pain resolved since she was started on betablockers. She also developed hypertension but that has resolved after we switched from toprol XL to labetalol.   Past Medical History  Diagnosis Date  . GERD (gastroesophageal reflux disease)   . Allergy-induced asthma   . 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 History of ovarian cyst   . Pelvic pain in female   . History of colon polyps    Past Surgical History  Procedure Laterality Date  . Cesarean section  03-01-2008  . Im pinning right elbow fx  [place]  . Laparoscopy N/A 07/29/2014    Procedure: LAPAROSCOPY DIAGNOSTIC;  Surgeon: Cyril Mourning, MD;  Location: [facility];  Service: Gynecology;  Laterality: N/A;   Current Outpatient Prescriptions  Medication Sig Dispense Refill  . famotidine-calcium carbonate-magnesium hydroxide (PEPCID COMPLETE) 10-800-165 MG CHEW chewable tablet Chew 2 tablets by mouth daily as needed (heartburn).    . labetalol (NORMODYNE) 100 MG tablet Take 1 tablet (100 mg total) by mouth 2 (two) times daily. 180 tablet 3  . Prenatal Vit-Fe Fumarate-FA (PRENATAL MULTIVITAMIN) TABS tablet Take 1 tablet by mouth daily at 12 noon.    Marland Kitchen PROAIR HFA 108 (90 BASE) MCG/ACT inhaler Inhale 1-2 puffs into the lungs every 4 (four) hours as needed for wheezing or shortness of breath. 1 Inhaler 1  . albuterol (PROVENTIL) (2.5 MG/3ML) 0.083% nebulizer solution Take 2.5 mg by nebulization every 6 (six) hours as needed for wheezing or shortness of breath.     No current facility-administered medications for this visit.   Allergies:   Vicodin   Social History:  The patient  reports that she has been smoking Cigarettes.  She has a 5 pack-year smoking history. She has never used smokeless tobacco. She reports that she does not drink alcohol or use illicit drugs.   Family History:  The patient's family history includes Colon polyps in her mother; Crohn's disease in  her mother; Hemophilia in her father and son; Hyperlipidemia in her father and mother.   ROS:  Please see the history of present illness.   Otherwise, review of systems are positive for none.   All other systems are reviewed and negative.   PHYSICAL EXAM: VS:  BP 116/70 mmHg  Pulse 95  Ht 5' 6.75" (1.695 m)  Wt 243 lb (110.224 kg)  BMI 38.37 kg/m2  LMP 09/18/2014 , BMI Body mass index is 38.37 kg/(m^2). GEN: Well nourished, well developed, in no acute distress HEENT: normal Neck: no JVD, carotid bruits, or masses Cardiac: RRR; no murmurs,  rubs, or gallops,no edema  Respiratory:  clear to auscultation bilaterally, normal work of breathing GI: soft, nontender, nondistended, + BS MS: no deformity or atrophy Skin: warm and dry, no rash Neuro:  Strength and sensation are intact Psych: euthymic mood, full affect  EKG:  EKG is ordered today. The ekg ordered today demonstrates SR, 95 BPM, LVH  Recent Labs: 05/17/2014: ALT 18; BUN 9; Creatinine, Ser 0.7; Potassium 3.7; Sodium 137 01/10/2015: Hemoglobin 11.1*; Platelets 168 01/12/2015: TSH 1.50   Lipid Panel    Component Value Date/Time   CHOL 197 05/17/2014 0946   TRIG 175.0* 05/17/2014 0946   HDL 37.60* 05/17/2014 0946   CHOLHDL 5 05/17/2014 0946   VLDL 35.0 05/17/2014 0946   LDLCALC 124* 05/17/2014 0946    Wt Readings from Last 3 Encounters:  02/23/15 243 lb (110.224 kg)  01/12/15 241 lb (109.317 kg)  01/10/15 242 lb (109.77 kg)   TTE: 01/12/15 - Left ventricle: The cavity size was mildly dilated. Systolic function was mildly reduced. The estimated ejection fraction was in the range of 45% to 50%. Wall motion was normal; there were no regional wall motion abnormalities. - Mitral valve: There was trivial regurgitation.  TTE: 02/16/2015 - Left ventricle: The cavity size was normal. Systolic function was normal. The estimated ejection fraction was 55%. Wall motion was normal; there were no regional wall motion abnormalities. - Left atrium: The atrium was mildly dilated. - Atrial septum: No defect or patent foramen ovale was identified.    ASSESSMENT AND PLAN:  29 year old female G3P2 in 17th week of pregnancy  1. Palpitations - echo on 01/12/15 showed LVEF 45-50%, started on toprol XL 25 mg po daily and improved to 55%. Improved symptoms.  2. LV dysfunction - improved, she is euvolemic, we Kane follow closely, LVEF improved, now low normal. Continue labetalol.  3. Hypertension - resolved with labetalol.   4. Chest pain - rather atypical -  resolved  5. Hyperlipidemia - we Kane recheck post pregnancy, diet only for now.   Follow up in 6 weeks.   [name] 02/23/2015

## 2015-02-27 ENCOUNTER — Telehealth: Payer: Self-pay | Admitting: Cardiology

## 2015-02-27 NOTE — Telephone Encounter (Deleted)
ERROR

## 2015-03-24 NOTE — Telephone Encounter (Signed)
ERROR

## 2015-03-30 ENCOUNTER — Telehealth: Payer: Self-pay | Admitting: Cardiovascular Disease

## 2015-03-30 ENCOUNTER — Inpatient Hospital Stay (HOSPITAL_COMMUNITY)
Admission: AD | Admit: 2015-03-30 | Discharge: 2015-03-30 | Disposition: A | Discharge status: Home / Self Care | Payer: 59 | Source: Ambulatory Visit | Attending: Obstetrics and Gynecology | Admitting: Obstetrics and Gynecology

## 2015-03-30 ENCOUNTER — Encounter (HOSPITAL_COMMUNITY): Payer: Self-pay

## 2015-03-30 DIAGNOSIS — Z3A27 27 weeks gestation of pregnancy: Secondary | ICD-10-CM | POA: Insufficient documentation

## 2015-03-30 DIAGNOSIS — O99332 Smoking (tobacco) complicating pregnancy, second trimester: Secondary | ICD-10-CM | POA: Insufficient documentation

## 2015-03-30 DIAGNOSIS — O368120 Decreased fetal movements, second trimester, not applicable or unspecified: Secondary | ICD-10-CM | POA: Diagnosis present

## 2015-03-30 DIAGNOSIS — O368131 Decreased fetal movements, third trimester, fetus 1: Secondary | ICD-10-CM

## 2015-03-30 DIAGNOSIS — R000 Tachycardia, unspecified: Secondary | ICD-10-CM

## 2015-03-30 DIAGNOSIS — F17210 Nicotine dependence, cigarettes, uncomplicated: Secondary | ICD-10-CM | POA: Diagnosis not present

## 2015-03-30 DIAGNOSIS — Z3689 Encounter for other specified antenatal screening: Secondary | ICD-10-CM

## 2015-03-30 DIAGNOSIS — R002 Palpitations: Secondary | ICD-10-CM

## 2015-03-30 LAB — URINALYSIS, ROUTINE W REFLEX MICROSCOPIC
BILIRUBIN URINE: NEGATIVE
Glucose, UA: NEGATIVE mg/dL
Hgb urine dipstick: NEGATIVE
KETONES UR: NEGATIVE mg/dL
NITRITE: NEGATIVE
PH: 6 (ref 5.0–8.0)
PROTEIN: NEGATIVE mg/dL
Specific Gravity, Urine: 1.015 (ref 1.005–1.030)
UROBILINOGEN UA: 0.2 mg/dL (ref 0.0–1.0)

## 2015-03-30 LAB — URINE MICROSCOPIC-ADD ON

## 2015-03-30 NOTE — MAU Provider Note (Signed)
History     CSN: [id number]  Arrival date and time: 03/30/15 1646   First Provider Initiated Contact with Patient 03/30/15 1745      Chief Complaint  Patient presents with  . Decreased Fetal Movement  . Tachycardia   HPI   Ms.Michelle Kane is a 29 y.o. female [phone] at [redacted]w[redacted]d presenting to MAU with decreased fetal movement and tachycardia. In June she was diagnosed with tachycardia unspecified and an enlarged heart. She see's cardiology every 6 weeks. She was started on Toprol XL 25 mg and then switched to labetalol, she has had very few episodes of tachycardia since medication was started. Today she has felt very tired and felt her HR to be in the 120's for several hours; the patient had chest pain and felt diaphoretic earlier today, however feels much better now and denies symptoms. She just wanted to check to make sure everything was ok.   She has taken her medication as prescribed Since her arrival she has felt the baby move normally.   OB History    Gravida Para Term Preterm AB TAB SAB Ectopic Multiple Living   4 2 2  1  1   2       Past Medical History  Diagnosis Date  . GERD (gastroesophageal reflux disease)   . Allergy-induced asthma   . 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 History of ovarian cyst   . Pelvic pain in female   . History of colon polyps   . Tachycardia   . Enlarged heart     Past Surgical History  Procedure Laterality Date  . Cesarean section  03-01-2008  . Im pinning right elbow fx  [place]  . Laparoscopy N/A 07/29/2014    Procedure: LAPAROSCOPY DIAGNOSTIC;  Surgeon: Cyril Mourning, MD;  Location: [facility];  Service: Gynecology;  Laterality: N/A;    Family History  Problem Relation Age of Onset  . Hyperlipidemia Mother   . Crohn's disease Mother   . Colon polyps Mother   . Hyperlipidemia Father   . Hemophilia Father   . Hemophilia Son     Social History  Substance Use Topics  . Smoking status:  Current Every Day Smoker -- 0.50 packs/day for 10 years    Types: Cigarettes  . Smokeless tobacco: Never Used  . Alcohol Use: No     Comment: social    Allergies:  Allergies  Allergen Reactions  . Vicodin [Hydrocodone-Acetaminophen] Itching and Nausea And Vomiting    Prescriptions prior to admission  Medication Sig Dispense Refill Last Dose  . diphenhydrAMINE (BENADRYL) 25 MG tablet Take 25 mg by mouth every 6 (six) hours as needed for allergies.   Past Week at Unknown time  . famotidine-calcium carbonate-magnesium hydroxide (PEPCID COMPLETE) 10-800-165 MG CHEW chewable tablet Chew 2 tablets by mouth daily as needed (heartburn).   03/29/2015 at Unknown time  . labetalol (NORMODYNE) 100 MG tablet Take 1 tablet (100 mg total) by mouth 2 (two) times daily. 180 tablet 3 03/30/2015 at 1200  . Prenatal Vit-Fe Fumarate-FA (PRENATAL MULTIVITAMIN) TABS tablet Take 2 tablets by mouth daily at 12 noon.    03/30/2015 at 1200  . PROAIR HFA 108 (90 BASE) MCG/ACT inhaler Inhale 1-2 puffs into the lungs every 4 (four) hours as needed for wheezing or shortness of breath. 1 Inhaler 1 rescue   Results for orders placed or performed during the hospital encounter of 03/30/15 (from the past 48 hour(s))  Urinalysis, Routine w reflex microscopic (not at [facility])     Status: Abnormal   Collection Time: 03/30/15  5:05 PM  Result Value Ref Range   Color, Urine YELLOW YELLOW   APPearance CLEAR CLEAR   Specific Gravity, Urine 1.015 1.005 - 1.030   pH 6.0 5.0 - 8.0   Glucose, UA NEGATIVE NEGATIVE mg/dL   Hgb urine dipstick NEGATIVE NEGATIVE   Bilirubin Urine NEGATIVE NEGATIVE   Ketones, ur NEGATIVE NEGATIVE mg/dL   Protein, ur NEGATIVE NEGATIVE mg/dL   Urobilinogen, UA 0.2 0.0 - 1.0 mg/dL   Nitrite NEGATIVE NEGATIVE   Leukocytes, UA MODERATE (A) NEGATIVE  Urine microscopic-add on     Status: Abnormal   Collection Time: 03/30/15  5:05 PM  Result Value Ref Range   Squamous Epithelial / LPF MANY (A) RARE   WBC, UA  3-6 <3 WBC/hpf   RBC / HPF 3-6 <3 RBC/hpf   Bacteria, UA MANY (A) RARE    Review of Systems  Constitutional: Positive for malaise/fatigue. Negative for diaphoresis.  Respiratory: Negative for shortness of breath.   Cardiovascular: Negative for chest pain and palpitations.  Genitourinary: Negative for dysuria, urgency and frequency.   Physical Exam   Blood pressure 122/77, pulse 104, temperature 98.3 F (36.8 C), resp. rate 18, height 5' 6.75" (1.695 m), weight 113.49 kg (250 lb 3.2 oz), last menstrual period 09/18/2014, SpO2 99 %.  Physical Exam  Constitutional: She is oriented to person, place, and time. She appears well-developed and well-nourished. No distress.  HENT:  Head: Normocephalic.  Eyes: Pupils are equal, round, and reactive to light.  Cardiovascular: Normal rate, regular rhythm and normal heart sounds.   Respiratory: Effort normal and breath sounds normal. No respiratory distress. She has no wheezes. She has no rales.  Musculoskeletal: Normal range of motion.  Neurological: She is alert and oriented to person, place, and time.  Skin: Skin is warm. She is not diaphoretic.  Psychiatric: Her behavior is normal.    Fetal Tracing: Baseline: 140 bpm  Variability: Moderate  Accelerations: 15x15 Decelerations: Quick Variables  Toco: Quiet    MAU Course  Procedures  None  MDM  Per cardiology note:   TTE: 01/12/15 - Left ventricle: The cavity size was mildly dilated. Systolic function was mildly reduced. The estimated ejection fraction was in the range of 45% to 50%. Wall motion was normal; there were no regional wall motion abnormalities. - Mitral valve: There was trivial regurgitation.  TTE: 02/16/2015 - Left ventricle: The cavity size was normal. Systolic function was normal. The estimated ejection fraction was 55%. Wall motion was normal; there were no regional wall motion abnormalities. - Left atrium: The atrium was mildly dilated. - Atrial  septum: No defect or patent foramen ovale was identified.   EKG reviewed with Dr. Radene Knee cardiology paged multiple times without a call back. Sent a Page  through [facility] and no response back> Dr. Radene Knee notified. Patient is stable at this time> no complaints of chest pain or tachycardia at this time. Baby is moving well.  Cardiology called back at [place], spoke to Dr. Radford Pax who reviewed EKG and instructed the patient to call the Cards office in the morning for cardiac holster monitor.      Assessment and Plan   A:  1. Tachycardia   2. Decreased fetal movement, third trimester, fetus 1   3. NST (non-stress test) reactive    P:  Discharge home in stable condition Call Dr. Ophelia Charter office tomorrow morning to schedule an  appointment to be seen  Go to [facility] if symptoms worsen tonight Call Dr. Francesca Oman office with cardiology in the morning; patient informed.  Continue labetalol as prescribed   Lezlie Lye, NP 03/30/2015 5:50 PM

## 2015-03-30 NOTE — MAU Note (Addendum)
Pt stated she has been Dx with tachycardia and enlarged heart at the beginning part of her pregnancy. Heart rate has been about 120 today. Has felt tired for about 1-2 days as well. Stated has not felt baby move much today and having some abd and back pain/cramping

## 2015-03-30 NOTE — Telephone Encounter (Signed)
[facility] called stating that patient was taking her pulse several times today and noted it to be around 120bpm.   She went to [facility] hospital and there her resting HR is around 105bpm.  SHe is followed by Dr. Meda Coffee and is on Labetolol.  Instructed Midwife that we would order a 48 Holter monitor to assess average heart rate to see if Labetolol needs to be adjusted.  Please call patient had have her come in 9/16 for monitor and forward this to Dr. Meda Coffee for further instructions.

## 2015-03-31 NOTE — Telephone Encounter (Signed)
Called patient to inform her that someone from our office will call her with an appointment to have a 48 holter monitor put on. Patient verbalized understanding.

## 2015-03-31 NOTE — Addendum Note (Signed)
Addended by: Dalphine Handing on: 03/31/2015 08:33 AM   Modules accepted: Orders

## 2015-04-02 LAB — URINE CULTURE: Special Requests: NORMAL

## 2015-04-03 ENCOUNTER — Ambulatory Visit (INDEPENDENT_AMBULATORY_CARE_PROVIDER_SITE_OTHER): Payer: 59

## 2015-04-03 DIAGNOSIS — R002 Palpitations: Secondary | ICD-10-CM

## 2015-04-10 ENCOUNTER — Telehealth: Payer: Self-pay | Admitting: *Deleted

## 2015-04-10 ENCOUNTER — Other Ambulatory Visit: Payer: Self-pay

## 2015-04-10 MED ORDER — LABETALOL HCL 100 MG PO TABS: 200.0000 mg | ORAL_TABLET | Freq: Two times a day (BID) | ORAL | Status: DC

## 2015-04-10 NOTE — Telephone Encounter (Signed)
Called in follow up to conversation with Everlean Alstrom earlier today.  She had told Valetta Fuller that since starting the Labetalol she had been having some CP off and on.  She has an appointment with Dr. Meda Coffee on Thursday 9/29.. States she is having the pain off and on and usually resolves. Spoke w/Lori Gerhardt,NP who suggests that if pain continues then she needs to go to ER.  Notified pt otherwise to see Dr. Meda Coffee on Thursday.

## 2015-04-10 NOTE — Progress Notes (Signed)
I would continue for now and see her later this week.

## 2015-04-13 ENCOUNTER — Encounter: Payer: Self-pay | Admitting: Cardiology

## 2015-04-13 ENCOUNTER — Ambulatory Visit (INDEPENDENT_AMBULATORY_CARE_PROVIDER_SITE_OTHER): Payer: 59 | Admitting: Cardiology

## 2015-04-13 VITALS — BP 124/68 | HR 96 | Ht 66.75 in | Wt 256.0 lb

## 2015-04-13 DIAGNOSIS — I471 Supraventricular tachycardia: Secondary | ICD-10-CM | POA: Diagnosis not present

## 2015-04-13 DIAGNOSIS — Z8679 Personal history of other diseases of the circulatory system: Secondary | ICD-10-CM

## 2015-04-13 DIAGNOSIS — R000 Tachycardia, unspecified: Secondary | ICD-10-CM

## 2015-04-13 DIAGNOSIS — R002 Palpitations: Secondary | ICD-10-CM | POA: Diagnosis not present

## 2015-04-13 DIAGNOSIS — R072 Precordial pain: Secondary | ICD-10-CM | POA: Diagnosis not present

## 2015-04-13 DIAGNOSIS — O163 Unspecified maternal hypertension, third trimester: Secondary | ICD-10-CM

## 2015-04-13 DIAGNOSIS — Z3A16 16 weeks gestation of pregnancy: Secondary | ICD-10-CM

## 2015-04-13 MED ORDER — LABETALOL HCL 300 MG PO TABS: 300.0000 mg | ORAL_TABLET | Freq: Two times a day (BID) | ORAL | Status: DC

## 2015-04-13 NOTE — Progress Notes (Signed)
Patient ID: Michelle Kane, female   DOB: July 16, 1985, 29 y.o.   MRN: [id number]      Cardiology Office Note  Date:  04/13/2015   ID:  Michelle Kane, DOB 1986/05/30, MRN [id number]  PCP:  Arnette Norris, MD  Cardiologist:  [name], MD   Chief complain: Chest pain, SOB, palpitations   History of Present Illness: Michelle Kane is a 29 y.o. female, G3P2 in 17th week of her pregnancy who has developed palpitations about a month ago. She states that she is tachycardic with minimal activity and it is associated with dizziness on occasions, CO and SOB. No syncope. She can also develop chest pain at rest mostly sharp, left sided radiating to her left arm, also sometimes on exertion. 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 No LE edema, no orthopnea, no PND. FH of premature CAD, grandfather had MI, CABG in his 16', GGF died of CHF. Her bother and uncle also have dilated cardiomyopathy.  She has no prior cardiac history. She has had two prior pregnancies (ages 81, 61) with similar symptoms and no complications. She has no h/o HTN, HLP, thyroid problem.  02/23/2015 - the patient is now [redacted] weeks pregnant, she feels tired all the time and has DOE, no orthopnea or PND. She has dependent LE edema at the end of the day that resolves by the next morning.  Her palpitations are significantly improved, chest pain resolved since she was started on betablockers. She also developed hypertension but that has resolved after we switched from toprol XL to labetalol.   04/13/2015 - the patient underwent 24 hour Holter monitoring that showed inappropriate sinus tachycardia. She is also reporting chest pains that are sharp, not related to activity, deep breathing would help. She is experiencing mild LE edema at the end of the days. She stopped working based on our recommendation. She checks her BP at home and it is always in the  140-155 range. She had never had elevated BP in the past or in the previous pregnancies.  Past Medical History  Diagnosis Date  . GERD (gastroesophageal reflux disease)   . Allergy-induced asthma   . 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 History of ovarian cyst   . Pelvic pain in female   . History of colon polyps   . Tachycardia   . Enlarged heart    Past Surgical History  Procedure Laterality Date  . Cesarean section  03-01-2008  . Im pinning right elbow fx  [place]  . Laparoscopy N/A 07/29/2014    Procedure: LAPAROSCOPY DIAGNOSTIC;  Surgeon: Cyril Mourning, MD;  Location: [facility];  Service: Gynecology;  Laterality: N/A;   Current Outpatient Prescriptions  Medication Sig Dispense Refill  . diphenhydrAMINE (BENADRYL) 25 MG tablet Take 25 mg by mouth every 6 (six) hours as needed for allergies.    . famotidine-calcium carbonate-magnesium hydroxide (PEPCID COMPLETE) 10-800-165 MG CHEW chewable tablet Chew 2 tablets by mouth daily as needed (heartburn).    . labetalol (NORMODYNE) 100 MG tablet Take 2 tablets (200 mg total) by mouth 2 (two) times daily. 180 tablet 3  . Prenatal Vit-Fe Fumarate-FA (PRENATAL MULTIVITAMIN) TABS tablet Take 2 tablets by mouth daily at 12 noon.     Marland Kitchen PROAIR HFA 108 (90 BASE) MCG/ACT inhaler Inhale 1-2 puffs into the lungs every 4 (four) hours  as needed for wheezing or shortness of breath. 1 Inhaler 1   No current facility-administered medications for this visit.   Allergies:   Vicodin   Social History:  The patient  reports that she has been smoking Cigarettes.  She has a 5 pack-year smoking history. She has never used smokeless tobacco. She reports that she does not drink alcohol or use illicit drugs.   Family History:  The patient's family history includes Colon polyps in her mother; Crohn's disease in her mother; Hemophilia in her father and son; Hyperlipidemia in her father and mother.   ROS:  Please see the  history of present illness.   Otherwise, review of systems are positive for none.   All other systems are reviewed and negative.   PHYSICAL EXAM: VS:  BP 124/68 mmHg  Pulse 96  Ht 5' 6.75" (1.695 m)  Wt 256 lb (116.121 kg)  BMI 40.42 kg/m2  LMP 09/18/2014 , BMI Body mass index is 40.42 kg/(m^2). GEN: Well nourished, well developed, in no acute distress HEENT: normal Neck: no JVD, carotid bruits, or masses Cardiac: RRR; no murmurs, rubs, or gallops,no edema  Respiratory:  clear to auscultation bilaterally, normal work of breathing GI: soft, nontender, nondistended, + BS MS: no deformity or atrophy Skin: warm and dry, no rash Neuro:  Strength and sensation are intact Psych: euthymic mood, full affect  EKG:  EKG is ordered today. The ekg ordered today demonstrates SR, 95 BPM, LVH  Recent Labs: 05/17/2014: ALT 18; BUN 9; Creatinine, Ser 0.7; Potassium 3.7; Sodium 137 01/10/2015: Hemoglobin 11.1*; Platelets 168 01/12/2015: TSH 1.50   Lipid Panel    Component Value Date/Time   CHOL 197 05/17/2014 0946   TRIG 175.0* 05/17/2014 0946   HDL 37.60* 05/17/2014 0946   CHOLHDL 5 05/17/2014 0946   VLDL 35.0 05/17/2014 0946   LDLCALC 124* 05/17/2014 0946    Wt Readings from Last 3 Encounters:  04/13/15 256 lb (116.121 kg)  03/30/15 250 lb 3.2 oz (113.49 kg)  02/23/15 243 lb (110.224 kg)   TTE: 01/12/15 - Left ventricle: The cavity size was mildly dilated. Systolic function was mildly reduced. The estimated ejection fraction was in the range of 45% to 50%. Wall motion was normal; there were no regional wall motion abnormalities. - Mitral valve: There was trivial regurgitation.  TTE: 02/16/2015 - Left ventricle: The cavity size was normal. Systolic function was normal. The estimated ejection fraction was 55%. Wall motion was normal; there were no regional wall motion abnormalities. - Left atrium: The atrium was mildly dilated. - Atrial septum: No defect or patent foramen  ovale was identified.    ASSESSMENT AND PLAN:  29 year old female G3P2 in 17th week of pregnancy  1. Palpitations - echo on 01/12/15 showed LVEF 45-50%, started on toprol XL 25 mg po daily and improved to 55%. Improved symptoms. Repeat echo in 02/2015 showed normal LV size and function, normal RVSP. She had inappropriate sinus tachycardia on Holter monitor, we will further increase Labetalol to 300 mg po BID.  2. LV dysfunction - improved, she is euvolemic, we will follow closely, LVEF improved, now low normal. Continue labetalol.  3. Hypertension - increase labetalol to 300 mg po BID, she will monitor BP daily and send [place] emails with her diary. She is to be seen by her OB in 2 weeks and by me in 4 weeks. We will monitor her closely for signs of preeclampsia.  4. Chest pain - rather atypical   5. Hyperlipidemia -  we will recheck post pregnancy, diet only for now.   Follow up in 4 weeks.   [name] 04/13/2015

## 2015-04-13 NOTE — Patient Instructions (Signed)
Medication Instructions:   INCREASE YOUR LABETALOL TO 300 MG TWICE DAILY        Follow-Up:  DR Meda Coffee WOULD LIKE TO SEE YOU IN THE CLINIC IN 4 WEEKS---OKAY PER DR NELSON TO SCHEDULE THIS PATIENT ON 05/17/15 WITH HER.

## 2015-05-03 ENCOUNTER — Encounter: Payer: Self-pay | Admitting: Cardiology

## 2015-05-03 MED ORDER — LABETALOL HCL 200 MG PO TABS
400.0000 mg | ORAL_TABLET | Freq: Two times a day (BID) | ORAL | Status: DC
Start: 2015-05-03 — End: 2015-05-22

## 2015-05-03 NOTE — Telephone Encounter (Signed)
I would further increase to 400 mg po BID.      Thank you,    [place] Siren     Notified the pt via phone and mychart that Dr Meda Coffee reviewed her logged BP and HR that she sent through mychart, and she recommends that the pt increase her Labetalol to 400 mg po bid.  Informed the pt that I will send Labetalol 200 mg tablets and she should take 2 tablets (400 mg total) po bid to equal this dose.  Confirmed the pharmacy of choice with the pt.  Advised the pt to continue monitoring her HR and BP and log this information, and send them as needed for [place] to review. Pt verbalized understanding and agrees with this plan.

## 2015-05-05 ENCOUNTER — Inpatient Hospital Stay (HOSPITAL_COMMUNITY)
Admission: AD | Admit: 2015-05-05 | Discharge: 2015-05-05 | Disposition: A | Discharge status: Home / Self Care | Payer: Medicaid Other | Source: Ambulatory Visit | Attending: Obstetrics and Gynecology | Admitting: Obstetrics and Gynecology

## 2015-05-05 ENCOUNTER — Inpatient Hospital Stay (HOSPITAL_COMMUNITY): Payer: Medicaid Other

## 2015-05-05 DIAGNOSIS — K219 Gastro-esophageal reflux disease without esophagitis: Secondary | ICD-10-CM | POA: Diagnosis not present

## 2015-05-05 DIAGNOSIS — Z3A32 32 weeks gestation of pregnancy: Secondary | ICD-10-CM | POA: Diagnosis not present

## 2015-05-05 DIAGNOSIS — J45909 Unspecified asthma, uncomplicated: Secondary | ICD-10-CM | POA: Diagnosis not present

## 2015-05-05 DIAGNOSIS — O26893 Other specified pregnancy related conditions, third trimester: Secondary | ICD-10-CM | POA: Diagnosis present

## 2015-05-05 DIAGNOSIS — O99513 Diseases of the respiratory system complicating pregnancy, third trimester: Secondary | ICD-10-CM | POA: Diagnosis not present

## 2015-05-05 DIAGNOSIS — O99333 Smoking (tobacco) complicating pregnancy, third trimester: Secondary | ICD-10-CM | POA: Insufficient documentation

## 2015-05-05 DIAGNOSIS — Z79899 Other long term (current) drug therapy: Secondary | ICD-10-CM | POA: Diagnosis not present

## 2015-05-05 DIAGNOSIS — Z36 Encounter for antenatal screening of mother: Secondary | ICD-10-CM | POA: Diagnosis not present

## 2015-05-05 DIAGNOSIS — O99413 Diseases of the circulatory system complicating pregnancy, third trimester: Secondary | ICD-10-CM | POA: Insufficient documentation

## 2015-05-05 DIAGNOSIS — Z3689 Encounter for other specified antenatal screening: Secondary | ICD-10-CM

## 2015-05-05 DIAGNOSIS — I498 Other specified cardiac arrhythmias: Secondary | ICD-10-CM | POA: Diagnosis not present

## 2015-05-05 DIAGNOSIS — O288 Other abnormal findings on antenatal screening of mother: Secondary | ICD-10-CM

## 2015-05-05 DIAGNOSIS — F17210 Nicotine dependence, cigarettes, uncomplicated: Secondary | ICD-10-CM | POA: Insufficient documentation

## 2015-05-05 LAB — PROTEIN / CREATININE RATIO, URINE
Creatinine, Urine: 37 mg/dL
Total Protein, Urine: <6 mg/dL

## 2015-05-05 LAB — COMPREHENSIVE METABOLIC PANEL
ALK PHOS: 88 U/L (ref 38–126)
ALT: 9 U/L — AB (ref 14–54)
ANION GAP: 6 (ref 5–15)
AST: 10 U/L — ABNORMAL LOW (ref 15–41)
Albumin: 2.6 g/dL — ABNORMAL LOW (ref 3.5–5.0)
BILIRUBIN TOTAL: 0.4 mg/dL (ref 0.3–1.2)
BUN: 6 mg/dL (ref 6–20)
CALCIUM: 8.3 mg/dL — AB (ref 8.9–10.3)
CO2: 18 mmol/L — AB (ref 22–32)
CREATININE: 0.46 mg/dL (ref 0.44–1.00)
Chloride: 111 mmol/L (ref 101–111)
GFR calc non Af Amer: >60 mL/min (ref >60)
GLUCOSE: 72 mg/dL (ref 65–99)
Potassium: 3.5 mmol/L (ref 3.5–5.1)
SODIUM: 135 mmol/L (ref 135–145)
TOTAL PROTEIN: 6.2 g/dL — AB (ref 6.5–8.1)

## 2015-05-05 LAB — LACTATE DEHYDROGENASE: LDH: 94 U/L — ABNORMAL LOW (ref 98–192)

## 2015-05-05 LAB — CBC
HEMATOCRIT: 29.6 % — AB (ref 36.0–46.0)
HEMOGLOBIN: 10.2 g/dL — AB (ref 12.0–15.0)
MCH: 31.3 pg (ref 26.0–34.0)
MCHC: 34.5 g/dL (ref 30.0–36.0)
MCV: 90.8 fL (ref 78.0–100.0)
Platelets: 198 10*3/uL (ref 150–400)
RBC: 3.26 MIL/uL — AB (ref 3.87–5.11)
RDW: 13.4 % (ref 11.5–15.5)
WBC: 9.7 10*3/uL (ref 4.0–10.5)

## 2015-05-05 LAB — URINALYSIS, ROUTINE W REFLEX MICROSCOPIC
Bilirubin Urine: NEGATIVE
GLUCOSE, UA: NEGATIVE mg/dL
KETONES UR: NEGATIVE mg/dL
NITRITE: NEGATIVE
PH: 6 (ref 5.0–8.0)
Protein, ur: NEGATIVE mg/dL
Urobilinogen, UA: 0.2 mg/dL (ref 0.0–1.0)

## 2015-05-05 LAB — URINE MICROSCOPIC-ADD ON

## 2015-05-05 LAB — URIC ACID: URIC ACID, SERUM: 5 mg/dL (ref 2.3–6.6)

## 2015-05-05 MED ORDER — GI COCKTAIL ~~LOC~~
30.0000 mL | Freq: Once | ORAL | Status: AC
  Administered 2015-05-05: 30 mL via ORAL
  Filled 2015-05-05: qty 30

## 2015-05-05 NOTE — Discharge Instructions (Signed)
Fetal Movement Counts Patient Name: __________________________________________________ Patient Due Date: ____________________ Performing a fetal movement count is highly recommended in high-risk pregnancies, but it is good for every pregnant woman to do. Your health care provider may ask you to start counting fetal movements at 28 weeks of the pregnancy. Fetal movements often increase:  After eating a full meal.  After physical activity.  After eating or drinking something sweet or cold.  At rest. Pay attention to when you feel the baby is most active. This will help you notice a pattern of your baby's sleep and wake cycles and what factors contribute to an increase in fetal movement. It is important to perform a fetal movement count at the same time each day when your baby is normally most active.  HOW TO COUNT FETAL MOVEMENTS 1. Find a quiet and comfortable area to sit or lie down on your left side. Lying on your left side provides the best blood and oxygen circulation to your baby. 2. Write down the day and time on a sheet of paper or in a journal. 3. Start counting kicks, flutters, swishes, rolls, or jabs in a 2-hour period. You should feel at least 10 movements within 2 hours. 4. If you do not feel 10 movements in 2 hours, wait 2-3 hours and count again. Look for a change in the pattern or not enough counts in 2 hours. SEEK MEDICAL CARE IF:  You feel less than 10 counts in 2 hours, tried twice.  There is no movement in over an hour.  The pattern is changing or taking longer each day to reach 10 counts in 2 hours.  You feel the baby is not moving as he or she usually does. Date: ____________ Movements: ____________ Start time: ____________ Elizebeth Koller time: ____________  Date: ____________ Movements: ____________ Start time: ____________ Elizebeth Koller time: ____________ Date: ____________ Movements: ____________ Start time: ____________ Elizebeth Koller time: ____________ Date: ____________ Movements:  ____________ Start time: ____________ Elizebeth Koller time: ____________ Date: ____________ Movements: ____________ Start time: ____________ Elizebeth Koller time: ____________ Date: ____________ Movements: ____________ Start time: ____________ Elizebeth Koller time: ____________ Date: ____________ Movements: ____________ Start time: ____________ Elizebeth Koller time: ____________ Date: ____________ Movements: ____________ Start time: ____________ Elizebeth Koller time: ____________  Date: ____________ Movements: ____________ Start time: ____________ Elizebeth Koller time: ____________ Date: ____________ Movements: ____________ Start time: ____________ Elizebeth Koller time: ____________ Date: ____________ Movements: ____________ Start time: ____________ Elizebeth Koller time: ____________ Date: ____________ Movements: ____________ Start time: ____________ Elizebeth Koller time: ____________ Date: ____________ Movements: ____________ Start time: ____________ Elizebeth Koller time: ____________ Date: ____________ Movements: ____________ Start time: ____________ Elizebeth Koller time: ____________   This information is not intended to replace advice given to you by your health care provider. Make sure you discuss any questions you have with your health care provider.   Document Released: 07/31/2006 Document Revised: 07/22/2014 Document Reviewed: 04/27/2012 Elsevier Interactive Patient Education [employer].

## 2015-05-05 NOTE — MAU Provider Note (Signed)
Chief Complaint:  Non-stress Test   None     HPI: Michelle Kane is a 29 y.o. [id number] at [redacted]w[redacted]d who presents to maternity admissions sent from the office related to 6/8 BPP and history of HTN in pregnancy. She is taking labetalol for tachycardia and blood pressures have been normal recently.  She denies h/a, RUQ pain, or visual disturbances.  She does report heartburn, reporting she has not eaten for several hours and that usually causes heartburn.  She has not taken anything for the heartburn. She reports good fetal movement, denies regular contractions, LOF, vaginal bleeding, vaginal itching/burning, urinary symptoms, dizziness, n/v, or fever/chills.    HPI  Past Medical History: Past Medical History  Diagnosis Date  . GERD (gastroesophageal reflux disease)   . Allergy-induced asthma   . 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 History of ovarian cyst   . Pelvic pain in female   . History of colon polyps   . Tachycardia   . Enlarged heart     Past obstetric history: OB History  Gravida Para Term Preterm AB SAB TAB Ectopic Multiple Living  4 2 2  1 1    2     # Outcome Date GA Lbr Len/2nd Weight Sex Delivery Anes PTL Lv  4 Current           3 SAB           2 Term      CS-LTranv     1 Term      Vag-Spont         Past Surgical History: Past Surgical History  Procedure Laterality Date  . Cesarean section  03-01-2008  . Im pinning right elbow fx  [place]  . Laparoscopy N/A 07/29/2014    Procedure: LAPAROSCOPY DIAGNOSTIC;  Surgeon: Cyril Mourning, MD;  Location: [facility];  Service: Gynecology;  Laterality: N/A;    Family History: Family History  Problem Relation Age of Onset  . Hyperlipidemia Mother   . Crohn's disease Mother   . Colon polyps Mother   . Hyperlipidemia Father   . Hemophilia Father   . Hemophilia Son     Social History: Social History  Substance Use Topics  . Smoking status: Current Every Day Smoker -- 0.50  packs/day for 10 years    Types: Cigarettes  . Smokeless tobacco: Never Used  . Alcohol Use: No     Comment: social    Allergies:  Allergies  Allergen Reactions  . Vicodin [Hydrocodone-Acetaminophen] Itching and Nausea And Vomiting    Meds:  No prescriptions prior to admission    ROS:  Review of Systems  Constitutional: Negative for fever, chills and fatigue.  HENT: Negative for sinus pressure.   Eyes: Negative for photophobia.  Respiratory: Negative for shortness of breath.   Cardiovascular: Negative for chest pain.  Gastrointestinal: Negative for nausea, vomiting, diarrhea and constipation.  Genitourinary: Negative for dysuria, frequency, flank pain, vaginal bleeding, vaginal discharge, difficulty urinating, vaginal pain and pelvic pain.  Musculoskeletal: Negative for neck pain.  Neurological: Negative for dizziness, weakness and headaches.  Psychiatric/Behavioral: Negative.      I have reviewed patient's Past Medical Hx, Surgical Hx, Family Hx, Social Hx, medications and allergies.   Physical Exam   Patient Vitals for the past 24 hrs:  BP Temp Pulse Resp SpO2 Height Weight  05/05/15 1624 - - 95 - 100 % - -  05/05/15 1500 116/65 mmHg - 110 - - - -  05/05/15 1430 112/67 mmHg - 88 - - - -  05/05/15 1411 122/65 mmHg - 99 - 97 % - -  05/05/15 1410 - - 94 - - - -  05/05/15 1351 109/75 mmHg 97.9 F (36.6 C) 100 18 - 5' 6.75" (1.695 m) 117.935 kg (260 lb)   Constitutional: Well-developed, well-nourished female in no acute distress.  Cardiovascular: normal rate Respiratory: normal effort GI: Abd soft, non-tender, gravid appropriate for gestational age.  MS: Extremities nontender, no edema, normal ROM Neurologic: Alert and oriented x 4.  GU: Neg CVAT.    FHT:  Baseline 130 , moderate variability, accelerations present, no decelerations Contractions: None on toco or to palpation   Labs: Results for orders placed or performed during the hospital encounter of 05/05/15  (from the past 24 hour(s))  CBC     Status: Abnormal   Collection Time: 05/05/15  2:07 PM  Result Value Ref Range   WBC 9.7 4.0 - 10.5 K/uL   RBC 3.26 (L) 3.87 - 5.11 MIL/uL   Hemoglobin 10.2 (L) 12.0 - 15.0 g/dL   HCT 29.6 (L) 36.0 - 46.0 %   MCV 90.8 78.0 - 100.0 fL   MCH 31.3 26.0 - 34.0 pg   MCHC 34.5 30.0 - 36.0 g/dL   RDW 13.4 11.5 - 15.5 %   Platelets 198 150 - 400 K/uL  Comprehensive metabolic panel     Status: Abnormal   Collection Time: 05/05/15  2:07 PM  Result Value Ref Range   Sodium 135 135 - 145 mmol/L   Potassium 3.5 3.5 - 5.1 mmol/L   Chloride 111 101 - 111 mmol/L   CO2 18 (L) 22 - 32 mmol/L   Glucose, Bld 72 65 - 99 mg/dL   BUN 6 6 - 20 mg/dL   Creatinine, Ser 0.46 0.44 - 1.00 mg/dL   Calcium 8.3 (L) 8.9 - 10.3 mg/dL   Total Protein 6.2 (L) 6.5 - 8.1 g/dL   Albumin 2.6 (L) 3.5 - 5.0 g/dL   AST 10 (L) 15 - 41 U/L   ALT 9 (L) 14 - 54 U/L   Alkaline Phosphatase 88 38 - 126 U/L   Total Bilirubin 0.4 0.3 - 1.2 mg/dL   GFR calc non Af Amer >60 >60 mL/min   GFR calc Af Amer >60 >60 mL/min   Anion gap 6 5 - 15  Lactate dehydrogenase     Status: Abnormal   Collection Time: 05/05/15  2:07 PM  Result Value Ref Range   LDH 94 (L) 98 - 192 U/L  Uric acid     Status: None   Collection Time: 05/05/15  2:07 PM  Result Value Ref Range   Uric Acid, Serum 5.0 2.3 - 6.6 mg/dL  Protein / creatinine ratio, urine     Status: None   Collection Time: 05/05/15  3:20 PM  Result Value Ref Range   Creatinine, Urine 37.00 mg/dL   Total Protein, Urine <6 mg/dL   Protein Creatinine Ratio        0.00 - 0.15 mg/mg[Cre]  Urinalysis, Routine w reflex microscopic (not at [facility])     Status: Abnormal   Collection Time: 05/05/15  3:20 PM  Result Value Ref Range   Color, Urine YELLOW YELLOW   APPearance CLEAR CLEAR   Specific Gravity, Urine <1.005 (L) 1.005 - 1.030   pH 6.0 5.0 - 8.0   Glucose, UA NEGATIVE NEGATIVE mg/dL   Hgb urine dipstick TRACE (A) NEGATIVE   Bilirubin Urine  NEGATIVE NEGATIVE   Ketones, ur NEGATIVE NEGATIVE mg/dL   Protein, ur NEGATIVE NEGATIVE mg/dL   Urobilinogen, UA 0.2 0.0 - 1.0 mg/dL   Nitrite NEGATIVE NEGATIVE   Leukocytes, UA LARGE (A) NEGATIVE  Urine microscopic-add on     Status: Abnormal   Collection Time: 05/05/15  3:20 PM  Result Value Ref Range   Squamous Epithelial / LPF FEW (A) RARE   WBC, UA 3-6 <3 WBC/hpf   RBC / HPF 0-2 <3 RBC/hpf   Bacteria, UA RARE RARE   --/--/A NEG (06/28 2252)  Imaging:  No results found. Preliminary report with 8/8 BPP  MAU Course/MDM: I have ordered labs and reviewed results.  Consult Dr Matthew Saras to review fetal monitoring and labs.   BPP 8/8 and NST reactive in MAU so reassuring fetal monitoring today.  Ok to follow up in office as scheduled next week.  Reassurance provided to pt about today's testing. Treatments in MAU included GI cocktail with complete relief of heartburn.  Pt stable at time of discharge.  Assessment: 1. NST (non-stress test) reactive   2. NST (non-stress test) nonreactive   --NST nonreactive in office, then NST reactive and BPP 8/8 in MAU  Plan: Discharge home Preterm labor precautions and fetal kick counts F/U with Dr Helane Rima next week as scheduled Return to MAU as needed for emergencies      Follow-up Information    Follow up with Margarette Asal, MD.   Specialty:  Obstetrics and Gynecology   Why:  Next week as scheduled , Return to MAU as needed for emergencies   Contact information:   [address] [id number] [phone]        Medication List    TAKE these medications        diphenhydrAMINE 25 MG tablet  Commonly known as:  BENADRYL  Take 25 mg by mouth every 6 (six) hours as needed for allergies.     famotidine-calcium carbonate-magnesium hydroxide 10-800-165 MG Chew chewable tablet  Commonly known as:  PEPCID COMPLETE  Chew 2 tablets by mouth daily as needed (heartburn).     labetalol 200 MG tablet  Commonly known  as:  NORMODYNE  Take 2 tablets (400 mg total) by mouth 2 (two) times daily.     prenatal multivitamin Tabs tablet  Take 2 tablets by mouth daily at 12 noon.     PROAIR HFA 108 (90 BASE) MCG/ACT inhaler  Generic drug:  albuterol  Inhale 1-2 puffs into the lungs every 4 (four) hours as needed for wheezing or shortness of breath.        Fatima Blank Certified Nurse-Midwife 05/05/2015 5:49 PM

## 2015-05-05 NOTE — MAU Note (Signed)
Pt sent from office for further fetal  Monitoring and blood pressure check. (PIH eval).

## 2015-05-11 ENCOUNTER — Encounter (HOSPITAL_COMMUNITY): Payer: Self-pay | Admitting: Anesthesiology

## 2015-05-11 ENCOUNTER — Encounter: Payer: Self-pay | Admitting: Cardiology

## 2015-05-11 ENCOUNTER — Encounter (HOSPITAL_COMMUNITY): Payer: Self-pay

## 2015-05-11 ENCOUNTER — Observation Stay (HOSPITAL_BASED_OUTPATIENT_CLINIC_OR_DEPARTMENT_OTHER): Payer: Medicaid Other

## 2015-05-11 ENCOUNTER — Observation Stay (HOSPITAL_COMMUNITY)
Admission: AD | Admit: 2015-05-11 | Discharge: 2015-05-12 | Disposition: A | Discharge status: Home / Self Care | Payer: Medicaid Other | Source: Ambulatory Visit | Attending: Obstetrics and Gynecology | Admitting: Obstetrics and Gynecology

## 2015-05-11 DIAGNOSIS — R000 Tachycardia, unspecified: Secondary | ICD-10-CM

## 2015-05-11 DIAGNOSIS — I5021 Acute systolic (congestive) heart failure: Secondary | ICD-10-CM

## 2015-05-11 DIAGNOSIS — O139 Gestational [pregnancy-induced] hypertension without significant proteinuria, unspecified trimester: Secondary | ICD-10-CM

## 2015-05-11 DIAGNOSIS — J45901 Unspecified asthma with (acute) exacerbation: Secondary | ICD-10-CM | POA: Insufficient documentation

## 2015-05-11 DIAGNOSIS — O9989 Other specified diseases and conditions complicating pregnancy, childbirth and the puerperium: Secondary | ICD-10-CM

## 2015-05-11 DIAGNOSIS — O34211 Maternal care for low transverse scar from previous cesarean delivery: Secondary | ICD-10-CM

## 2015-05-11 DIAGNOSIS — Z3689 Encounter for other specified antenatal screening: Secondary | ICD-10-CM

## 2015-05-11 DIAGNOSIS — R0602 Shortness of breath: Secondary | ICD-10-CM | POA: Diagnosis not present

## 2015-05-11 DIAGNOSIS — I509 Heart failure, unspecified: Secondary | ICD-10-CM

## 2015-05-11 DIAGNOSIS — K219 Gastro-esophageal reflux disease without esophagitis: Secondary | ICD-10-CM | POA: Insufficient documentation

## 2015-05-11 DIAGNOSIS — O99513 Diseases of the respiratory system complicating pregnancy, third trimester: Principal | ICD-10-CM | POA: Insufficient documentation

## 2015-05-11 DIAGNOSIS — Z3A33 33 weeks gestation of pregnancy: Secondary | ICD-10-CM | POA: Diagnosis not present

## 2015-05-11 DIAGNOSIS — I259 Chronic ischemic heart disease, unspecified: Secondary | ICD-10-CM | POA: Diagnosis not present

## 2015-05-11 DIAGNOSIS — O99613 Diseases of the digestive system complicating pregnancy, third trimester: Secondary | ICD-10-CM | POA: Insufficient documentation

## 2015-05-11 DIAGNOSIS — O99891 Other specified diseases and conditions complicating pregnancy: Secondary | ICD-10-CM

## 2015-05-11 DIAGNOSIS — R0789 Other chest pain: Secondary | ICD-10-CM | POA: Diagnosis not present

## 2015-05-11 DIAGNOSIS — Z8742 Personal history of other diseases of the female genital tract: Secondary | ICD-10-CM | POA: Diagnosis not present

## 2015-05-11 DIAGNOSIS — Z3490 Encounter for supervision of normal pregnancy, unspecified, unspecified trimester: Secondary | ICD-10-CM

## 2015-05-11 DIAGNOSIS — Z86010 Personal history of colonic polyps: Secondary | ICD-10-CM | POA: Insufficient documentation

## 2015-05-11 LAB — PROTEIN / CREATININE RATIO, URINE

## 2015-05-11 LAB — COMPREHENSIVE METABOLIC PANEL
ALT: 10 U/L — ABNORMAL LOW (ref 14–54)
ANION GAP: 5 (ref 5–15)
AST: 13 U/L — AB (ref 15–41)
Albumin: 2.5 g/dL — ABNORMAL LOW (ref 3.5–5.0)
Alkaline Phosphatase: 100 U/L (ref 38–126)
BILIRUBIN TOTAL: 0.3 mg/dL (ref 0.3–1.2)
BUN: <5 mg/dL — ABNORMAL LOW (ref 6–20)
CHLORIDE: 112 mmol/L — AB (ref 101–111)
CO2: 19 mmol/L — ABNORMAL LOW (ref 22–32)
Calcium: 8.7 mg/dL — ABNORMAL LOW (ref 8.9–10.3)
Creatinine, Ser: 0.46 mg/dL (ref 0.44–1.00)
Glucose, Bld: 85 mg/dL (ref 65–99)
POTASSIUM: 3.7 mmol/L (ref 3.5–5.1)
Sodium: 136 mmol/L (ref 135–145)
TOTAL PROTEIN: 5.8 g/dL — AB (ref 6.5–8.1)

## 2015-05-11 LAB — TYPE AND SCREEN
ABO/RH(D): A NEG
ANTIBODY SCREEN: NEGATIVE

## 2015-05-11 MED ORDER — ACETAMINOPHEN 325 MG PO TABS: 650.0000 mg | ORAL_TABLET | ORAL | Status: DC | PRN

## 2015-05-11 MED ORDER — DOCUSATE SODIUM 100 MG PO CAPS
100.0000 mg | ORAL_CAPSULE | Freq: Every day | ORAL | Status: DC
  Filled 2015-05-11: qty 1

## 2015-05-11 MED ORDER — LABETALOL HCL 100 MG PO TABS
200.0000 mg | ORAL_TABLET | Freq: Two times a day (BID) | ORAL | Status: DC
  Administered 2015-05-11 – 2015-05-12 (×2): 200 mg via ORAL
  Filled 2015-05-11 (×2): qty 2

## 2015-05-11 MED ORDER — FUROSEMIDE 10 MG/ML IJ SOLN
40.0000 mg | Freq: Two times a day (BID) | INTRAMUSCULAR | Status: DC
  Administered 2015-05-12: 40 mg via INTRAVENOUS
  Filled 2015-05-11 (×4): qty 4

## 2015-05-11 MED ORDER — CALCIUM CARBONATE ANTACID 500 MG PO CHEW
2.0000 | CHEWABLE_TABLET | ORAL | Status: DC | PRN
  Administered 2015-05-12 (×2): 400 mg via ORAL
  Filled 2015-05-11 (×2): qty 2

## 2015-05-11 MED ORDER — PRENATAL MULTIVITAMIN CH
1.0000 | ORAL_TABLET | Freq: Every day | ORAL | Status: DC
  Administered 2015-05-12: 1 via ORAL
  Filled 2015-05-11: qty 1

## 2015-05-11 MED ORDER — ZOLPIDEM TARTRATE 5 MG PO TABS
5.0000 mg | ORAL_TABLET | Freq: Every evening | ORAL | Status: DC | PRN
  Administered 2015-05-12: 5 mg via ORAL
  Filled 2015-05-11: qty 1

## 2015-05-11 MED ORDER — FUROSEMIDE 10 MG/ML IJ SOLN
40.0000 mg | Freq: Once | INTRAMUSCULAR | Status: AC
  Administered 2015-05-11: 40 mg via INTRAVENOUS
  Filled 2015-05-11: qty 4

## 2015-05-11 MED ORDER — MOMETASONE FURO-FORMOTEROL FUM 100-5 MCG/ACT IN AERO
2.0000 | INHALATION_SPRAY | Freq: Two times a day (BID) | RESPIRATORY_TRACT | Status: DC
  Administered 2015-05-11 – 2015-05-12 (×2): 2 via RESPIRATORY_TRACT
  Filled 2015-05-11: qty 8.8

## 2015-05-11 MED ORDER — LACTATED RINGERS IV SOLN
INTRAVENOUS | Status: DC
  Administered 2015-05-11: 75 mL/h via INTRAVENOUS

## 2015-05-11 NOTE — MAU Note (Signed)
Pt presents to MAU from physician's office for shortness of breath. PT states that she is being evaluated by a cardiologist for an enlarged heart with this pregnancy.

## 2015-05-11 NOTE — H&P (Signed)
Michelle Kane is a 29 y.o. female presenting for eval of SOB, she has been followed by Dr Meda Coffee, cardiology for tachycardia, and has been on beta blockers.  Seen in OB office today for routine exam, with complaints of SOB. Told to come to MAU for cardiac eval.  Dr Meda Coffee in MAU to perform ECHO, EKG, rec admission for diuresis and obsv. FHR reactive in appearance. Maternal Medical History:  Fetal activity: Perceived fetal activity is normal.   Last perceived fetal movement was within the past hour.      OB History    Gravida Para Term Preterm AB TAB SAB Ectopic Multiple Living   4 2 2  1  1   2      Past Medical History  Diagnosis Date  . GERD (gastroesophageal reflux disease)   . Allergy-induced asthma   . 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 History of ovarian cyst   . Pelvic pain in female   . History of colon polyps   . Tachycardia   . Enlarged heart    Past Surgical History  Procedure Laterality Date  . Cesarean section  03-01-2008  . Im pinning right elbow fx  [place]  . Laparoscopy N/A 07/29/2014    Procedure: LAPAROSCOPY DIAGNOSTIC;  Surgeon: Cyril Mourning, MD;  Location: [facility];  Service: Gynecology;  Laterality: N/A;   Family History: family history includes Colon polyps in her mother; Crohn's disease in her mother; Hemophilia in her father and son; Hyperlipidemia in her father and mother. Social History:  reports that she has quit smoking. Her smoking use included Cigarettes. She has a 5 pack-year smoking history. She has never used smokeless tobacco. She reports that she does not drink alcohol or use illicit drugs.   Prenatal Transfer Tool  Maternal Diabetes: none Genetic Screening: nl Maternal Ultrasounds/Referrals: normal Fetal Ultrasounds or other Referrals:  none Maternal Substance Abuse:  none Significant Maternal Medications:  Beta blocker Significant Maternal Lab Results:  none Other Comments:  none  ROS     Blood pressure 142/79, pulse 86, temperature 97.8 F (36.6 C), resp. rate 18, last menstrual period 09/18/2014, SpO2 99 %. Exam Physical Exam  Constitutional: She is oriented to person, place, and time. She appears well-developed.  HENT:  Head: Normocephalic and atraumatic.  Neck: Normal range of motion. Neck supple.  Cardiovascular: Normal rate and regular rhythm.   Respiratory: Effort normal and breath sounds normal.  GI:  34cm FH  Musculoskeletal: Normal range of motion.  Neurological: She is alert and oriented to person, place, and time.    Prenatal labs: ABO, Rh: --/--/A NEG (06/28 2252) Antibody: NEG (06/28 2252) Rubella:   RPR:    HBsAg:    HIV:    GBS:     Assessment/Plan: [redacted]w[redacted]d, prior CS, plans VBAC Maternal tachycardia, on beta blockers Cardiology eval for CHF/ cardiomyopathy Adm AICU, supplemental O2,lasix, inhaled steroid treatment EFM    Catcher Dehoyos M 05/11/2015, 8:36 PM

## 2015-05-11 NOTE — MAU Provider Note (Signed)
History     CSN: [id number]  Arrival date and time: 05/11/15 1840   First Provider Initiated Contact with Patient 05/11/15 1923      Chief Complaint  Patient presents with  . Shortness of Breath   HPI  Michelle Kane 29 y.o. [id number] [redacted]w[redacted]d presents to the MAU from the office with SOB; Pt has known cardiomegaly, tachycardia and being seen by Dr. Meda Coffee during this pregnancy. She currently is taking Labetolol and using an inhaler  Past Medical History  Diagnosis Date  . GERD (gastroesophageal reflux disease)   . Allergy-induced asthma   . 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 History of ovarian cyst   . Pelvic pain in female   . History of colon polyps   . Tachycardia   . Enlarged heart     Past Surgical History  Procedure Laterality Date  . Cesarean section  03-01-2008  . Im pinning right elbow fx  [place]  . Laparoscopy N/A 07/29/2014    Procedure: LAPAROSCOPY DIAGNOSTIC;  Surgeon: Cyril Mourning, MD;  Location: [facility];  Service: Gynecology;  Laterality: N/A;    Family History  Problem Relation Age of Onset  . Hyperlipidemia Mother   . Crohn's disease Mother   . Colon polyps Mother   . Hyperlipidemia Father   . Hemophilia Father   . Hemophilia Son     Social History  Substance Use Topics  . Smoking status: Former Smoker -- 0.50 packs/day for 10 years    Types: Cigarettes  . Smokeless tobacco: Never Used  . Alcohol Use: No     Comment: social    Allergies:  Allergies  Allergen Reactions  . Vicodin [Hydrocodone-Acetaminophen] Itching and Nausea And Vomiting    Prescriptions prior to admission  Medication Sig Dispense Refill Last Dose  . diphenhydrAMINE (BENADRYL) 25 MG tablet Take 25 mg by mouth every 6 (six) hours as needed for allergies.   Past Week at Unknown time  . famotidine-calcium carbonate-magnesium hydroxide (PEPCID COMPLETE) 10-800-165 MG CHEW chewable tablet Chew 2 tablets by mouth daily as needed  (heartburn).   05/10/2015 at Unknown time  . labetalol (NORMODYNE) 200 MG tablet Take 2 tablets (400 mg total) by mouth 2 (two) times daily. 120 tablet 1 05/11/2015 at 0930  . Prenatal Vit-Fe Fumarate-FA (PRENATAL MULTIVITAMIN) TABS tablet Take 2 tablets by mouth daily at 12 noon.    05/11/2015 at Unknown time  . PROAIR HFA 108 (90 BASE) MCG/ACT inhaler Inhale 1-2 puffs into the lungs every 4 (four) hours as needed for wheezing or shortness of breath. 1 Inhaler 1 05/11/2015 at Unknown time    Review of Systems  Constitutional: Negative for fever.       6 lb weight gain in 1 week  Respiratory: Positive for shortness of breath and wheezing.   Cardiovascular: Positive for leg swelling. Negative for chest pain and palpitations.  All other systems reviewed and are negative.  Physical Exam   Blood pressure 142/79, pulse 86, temperature 97.8 F (36.6 C), resp. rate 18, last menstrual period 09/18/2014, SpO2 99 %.  Physical Exam  Nursing note and vitals reviewed. Constitutional: She is oriented to person, place, and time. She appears well-developed and well-nourished. She appears distressed.  HENT:  Head: Normocephalic and atraumatic.  Neck: Normal range of motion.  Cardiovascular: Normal rate and regular rhythm.   Respiratory: She has wheezes.  Sob while sitting up and talking  GI: Soft. There is no tenderness.  Musculoskeletal: She exhibits edema.  Neurological: She is alert and oriented to person, place, and time.  Skin: Skin is warm and dry. She is not diaphoretic.  Psychiatric: She has a normal mood and affect. Her behavior is normal. Judgment and thought content normal.    MAU Course  Procedures  MDM Cardiology consulted- Dr Meda Coffee; IVF/Lasix; Admit ; Dr Matthew Saras aware ; Matthew Saras and Meda Coffee discussed poc.  Assessment and Plan  Shortness of breath Admit  Clemmons,Lori Grissett 05/11/2015, 8:23 PM

## 2015-05-11 NOTE — MAU Note (Signed)
Respiratory at bedside for EKG

## 2015-05-11 NOTE — MAU Note (Signed)
Cardiology contacted about pt to see if she needs tele orders. Per cardio, at this time, it would not be unreasonable to place pt on tele, however, due to pt placement, pt does not have to be on tele at this time.

## 2015-05-11 NOTE — MAU Note (Signed)
Urine sent to lab 

## 2015-05-11 NOTE — Progress Notes (Signed)
Echocardiogram 2D Echocardiogram has been performed.  Michelle Kane 05/11/2015, 9:49 PM

## 2015-05-11 NOTE — Consult Note (Signed)
CARDIOLOGY CONSULT NOTE   Patient ID: MAKENDRA VIGEANT MRN: [id number], DOB/AGE: February 26, 1986   Admit date: 05/11/2015 Date of Consult: 05/11/2015  Primary Physician: Arnette Norris, MD Primary Cardiologist: Dr Meda Coffee  Reason for consult:  Chest pain, SOB  Problem List  Past Medical History  Diagnosis Date  . GERD (gastroesophageal reflux disease)   . Allergy-induced asthma   . 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 History of ovarian cyst   . Pelvic pain in female   . History of colon polyps   . Tachycardia   . Enlarged heart     Past Surgical History  Procedure Laterality Date  . Cesarean section  03-01-2008  . Im pinning right elbow fx  [place]  . Laparoscopy N/A 07/29/2014    Procedure: LAPAROSCOPY DIAGNOSTIC;  Surgeon: Cyril Mourning, MD;  Location: [facility];  Service: Gynecology;  Laterality: N/A;    Allergies  Allergies  Allergen Reactions  . Vicodin [Hydrocodone-Acetaminophen] Itching and Nausea And Vomiting    HPI   Michelle Kane is a 29 y.o. female, G3P2 in 33th week of her pregnancy who has developed palpitations about a in her first trimester. At the time she was evaluated at the cardiology clinic for palpitations, tachycardia and SOB. She had no LE edema, no orthopnea, no PND at that time. Echocardiogram showed LVEF 45-50% and normal RVSP. She was started on Toprol XL, that was later switched to labetalol. Repeat echo showed LVEF 50-55%.   She was stable until about a week ago when her dyspnea worsened, now after walking minimal distances, she also developed PND and 3 pillow orthopnea. She has gained 6 lbs in the last week and developed LE edema. The patient complains of chest pressure. She was trying ti use albuterol inhaler that didn't give her much relief.   She has significant of FH of premature CAD, grandfather had MI, CABG in his 44', GGF died of CHF. Her bother and aunt also have dilated cardiomyopathy. Her  brother is post BiV ICD implantation.  She has no prior cardiac history. She has had two prior pregnancies (ages 86, 68) with similar symptoms and no complications. She has no h/o HTN, HLP, thyroid problem.  02/23/2015 - the patient is now [redacted] weeks pregnant, she feels tired all the time and has DOE, no orthopnea or PND. She has dependent LE edema at the end of the day that resolves by the next morning.   Inpatient Medications  . docusate sodium  100 mg Oral Daily  . [START ON 05/12/2015] prenatal multivitamin  1 tablet Oral Q1200    Family History Family History  Problem Relation Age of Onset  . Hyperlipidemia Mother   . Crohn's disease Mother   . Colon polyps Mother   . Hyperlipidemia Father   . Hemophilia Father   . Hemophilia Son      Social History Social History   Social History  . Marital Status: Single    Spouse Name: N/A  . Number of Children: N/A  . Years of Education: N/A   Occupational History  . Not on file.   Social History Main Topics  . Smoking status: Former Smoker -- 0.50 packs/day for 10 years    Types: Cigarettes  . Smokeless tobacco: Never Used  . Alcohol Use: No     Comment: social  . Drug Use: No  . Sexual Activity: Yes    Birth Control/ Protection: None   Other  Topics Concern  . Not on file   Social History Narrative     Review of Systems  General:  No chills, fever, night sweats or weight changes.  Cardiovascular:  No chest pain, dyspnea on exertion, edema, orthopnea, palpitations, paroxysmal nocturnal dyspnea. Dermatological: No rash, lesions/masses Respiratory: No cough, dyspnea Urologic: No hematuria, dysuria Abdominal:   No nausea, vomiting, diarrhea, bright red blood per rectum, melena, or hematemesis Neurologic:  No visual changes, wkns, changes in mental status. All other systems reviewed and are otherwise negative except as noted above.  Physical Exam  Blood pressure 116/30, pulse 91, temperature 97.8 F (36.6 C), resp.  rate 18, last menstrual period 09/18/2014, SpO2 100 %.  General: Pleasant, NAD Psych: Normal affect. Neuro: Alert and oriented X 3. Moves all extremities spontaneously. HEENT: Normal  Neck: Supple without bruits or JVD. Lungs:  Resp regular and unlabored, end expiratory wheezing B/L. Mild crackles at the basis Heart: RRR no s3, s4, or murmurs. Abdomen: consistent with 33 weeks of pregnancy, BS + x 4.  Extremities: No clubbing, cyanosis, mild B/L edema. DP/PT/Radials 2+ and equal bilaterally.  Labs  No results for input(s): CKTOTAL, CKMB, TROPONINI in the last 72 hours. Lab Results  Component Value Date   WBC 9.7 05/05/2015   HGB 10.2* 05/05/2015   HCT 29.6* 05/05/2015   MCV 90.8 05/05/2015   PLT 198 05/05/2015    Recent Labs Lab 05/11/15 2000  NA 136  K 3.7  CL 112*  CO2 19*  BUN <5*  CREATININE 0.46  CALCIUM 8.7*  PROT 5.8*  BILITOT 0.3  ALKPHOS 100  ALT 10*  AST 13*  GLUCOSE 85   Lab Results  Component Value Date   CHOL 197 05/17/2014   HDL 37.60* 05/17/2014   LDLCALC 124* 05/17/2014   TRIG 175.0* 05/17/2014   Lab Results  Component Value Date   DDIMER 0.27 02/04/2013   Invalid input(s): POCBNP  Radiology/Studies  [place] Mfm Fetal Bpp Wo Non Stress  05/06/2015  OBSTETRICAL ULTRASOUND: This exam was performed within a [facility]. The OB US report was generated in the [facility] system, and faxed to the ordering physician.  This report is available in the [employer]. See the [facility] Obstetric US report via the Image Link.  Echocardiogram - June 2016 - Left ventricle: The cavity size was mildly dilated. Systolic function was mildly reduced. The estimated ejection fraction was in the range of 45% to 50%. Wall motion was normal; there were no regional wall motion abnormalities. - Mitral valve: There was trivial regurgitation.  Echocardiogram - August 2016 - Left ventricle: The cavity size was normal. Systolic function was normal. The  estimated ejection fraction was 55%. Wall motion was normal; there were no regional wall motion abnormalities. - Left atrium: The atrium was mildly dilated. - Atrial septum: No defect or patent foramen ovale was identified.  ECG: SR, normal ECG    ASSESSMENT AND PLAN  29 year old female G3P2 in 33rd week of pregnancy  1. Acute systolic CHF - she has mild diffuse hypokinesis on tonights echo, LVEF 45-50%. She is clinically fluid overloaded. We will start lasix 40 mg iv BID and follow. We will start inhaled steroids - advair for wheezing  2. Tachycardia - continue labetalol  This was discussed with her OB/GYN physician Dr Matthew Saras who agrees with the plan. She is currently [redacted] week pregnant and they are planning to induce her at 35 weeks if stable until then.    Signed,  [name], MD, [facility] 05/11/2015, 9:37 PM

## 2015-05-11 NOTE — MAU Note (Signed)
Dr Nelson-cardiologist at bedside

## 2015-05-11 NOTE — MAU Note (Signed)
Echo tech at bedside performing echocardiogram.

## 2015-05-11 NOTE — Telephone Encounter (Signed)
Contacted the pt back to inform her that per Dr Meda Coffee she needs to go to [facility] for further work-up of fluid overload, and current symptoms, with administration of safe pregnancy diureses.  Spoke with the charge nurse at [facility] and informed her that the pt is a Dr Rachael Fee pt and she will be en route to their facility via private vehicle for cardiac complaints, sob, fluid overload, and high risk pregnancy.  Informed RN Mateo Flow that they can page cardiology for consult, once the pt is worked-up. Informed Mateo Flow that Dr Meda Coffee is on-call and Cecilie Kicks NP is on-call the rest of the night. Per Mateo Flow RN she verbalized understanding.  Pt verbalized understanding and agrees with this plan.

## 2015-05-11 NOTE — Telephone Encounter (Signed)
Ivy, please call her to see what the OB told her and to get the feel how she is doing.    I am not sure if she is fluid overloaded or just usual pregnancy related weight gain. Please ask if she has LE edema.    Thank you,    K    Contacted the pt about mychart concerns and asked her per Dr Meda Coffee, what her OB said to her and how is she feeling now.  Informed the pt that per Dr Meda Coffee she is unsure if she is in fluid overloaded or just usual pregnancy related weight gain.  Also asked the pt if she is experiencing any LEE.   Per the pt she reports that she went to see her OB today and Dr Dian Queen told her that they will be inducing her and taking the baby at 68 weeks.  Pt states this is because of her continuous SOB and tachycardia.  Per the pt she states that she has gained 5 lbs in a week and has developed more sob.  Pt states that she is only drinking water and maintaining a low sodium diet.  Pt states that she uses her albuterol inhaler and this helps none, and only causes her HR to increase.  The pt reports that her HR today was 100 and her spo2 is 90 % RA.  Pt reports that her ankles and feet are swollen. Pt reports that her OBGYN will be scheduling an amniocentesis next week, to assess the babies lung function, to be able to safely take the baby at 18 weeks.  Pt request that if Dr Meda Coffee could give her OBGYN a call or email each other through [place], to exchange pts issues, cardiac complaints, and so forth, she would greatly appreciate this.  Pt states that Dr Thomes Dinning number to her office is [phone] and fax is [phone].  Pt states she is very scared and nervous, and feels horrible.  Informed the pt that I will send this message right now to Dr Meda Coffee for further review and recommendation, and follow-up with her thereafter.  Informed the pt that if she does not hear from me tonight with Dr Francesca Oman recommendations, she should contact the office tomorrow and ask to speak  with a triage nurse to further follow-up with this.  Pt verbalized understanding and agrees with this plan.

## 2015-05-12 ENCOUNTER — Ambulatory Visit (HOSPITAL_COMMUNITY): Payer: Medicaid Other

## 2015-05-12 ENCOUNTER — Observation Stay (HOSPITAL_COMMUNITY): Payer: Medicaid Other

## 2015-05-12 DIAGNOSIS — I509 Heart failure, unspecified: Secondary | ICD-10-CM

## 2015-05-12 DIAGNOSIS — Z3A33 33 weeks gestation of pregnancy: Secondary | ICD-10-CM | POA: Diagnosis not present

## 2015-05-12 DIAGNOSIS — Z8742 Personal history of other diseases of the female genital tract: Secondary | ICD-10-CM | POA: Diagnosis not present

## 2015-05-12 DIAGNOSIS — J45901 Unspecified asthma with (acute) exacerbation: Secondary | ICD-10-CM | POA: Diagnosis not present

## 2015-05-12 DIAGNOSIS — Z86010 Personal history of colonic polyps: Secondary | ICD-10-CM | POA: Diagnosis not present

## 2015-05-12 DIAGNOSIS — O99613 Diseases of the digestive system complicating pregnancy, third trimester: Secondary | ICD-10-CM | POA: Diagnosis not present

## 2015-05-12 DIAGNOSIS — O9989 Other specified diseases and conditions complicating pregnancy, childbirth and the puerperium: Secondary | ICD-10-CM | POA: Diagnosis present

## 2015-05-12 DIAGNOSIS — O99513 Diseases of the respiratory system complicating pregnancy, third trimester: Secondary | ICD-10-CM | POA: Diagnosis not present

## 2015-05-12 DIAGNOSIS — K219 Gastro-esophageal reflux disease without esophagitis: Secondary | ICD-10-CM | POA: Diagnosis not present

## 2015-05-12 DIAGNOSIS — I420 Dilated cardiomyopathy: Secondary | ICD-10-CM

## 2015-05-12 DIAGNOSIS — Z331 Pregnant state, incidental: Secondary | ICD-10-CM | POA: Insufficient documentation

## 2015-05-12 LAB — TROPONIN I: Troponin I: 0.04 ng/mL — ABNORMAL HIGH (ref <0.031)

## 2015-05-12 LAB — CULTURE, BETA STREP (GROUP B ONLY)

## 2015-05-12 LAB — BRAIN NATRIURETIC PEPTIDE: B Natriuretic Peptide: 26 pg/mL (ref 0.0–100.0)

## 2015-05-12 MED ORDER — BETAMETHASONE SOD PHOS & ACET 6 (3-3) MG/ML IJ SUSP
12.0000 mg | Freq: Once | INTRAMUSCULAR | Status: AC
  Administered 2015-05-12: 12 mg via INTRAMUSCULAR
  Filled 2015-05-12: qty 2

## 2015-05-12 NOTE — Progress Notes (Addendum)
Pt feeling much better today.  SOB improved.  3# wt loss  AF, VSS  HR 78-84 Gen - NAD Abd - gravid, NT Ext - NT  A/P:  33 wks with mild cardiomyopathy, responded well to diuresis Cardio following  MFM ultrasound today

## 2015-05-12 NOTE — Progress Notes (Signed)
Report given to [facility] for patient to be transported. Report given to accepting Nurse Anderson Malta at [facility] and Delivery. Patient updated that ambulance is on the way.

## 2015-05-12 NOTE — Consult Note (Signed)
MFM Consultation, Staff Note:  By way of consultation, I spoke to your patient about the management of pregnancy in context of hypertensive disorder of pregnancy (eg, preeclampsia) complicated by cardiomyopathy and pulmonary edema (now clinically resolved following diuresis with Lasix IV).    Review of the patient' s records and during direct discussion with the patient reveals that she began to have palpitations and tachycardia early in pregnancy.  She was started on metoprolol for rate control and was taken out of work on July 12 given concerns for evolving cardiomyopathy.  However, after 22 weeks she additionally developed new onset of hypertension with blood pressures in excess of 150/100 and persistent tachycardia/palpitations, leading to starting her on labetalol 178m po bid in August 2016.  Over the next several weeks, blood pressures persistently in excess of 150/100 led to increasing labetalol to 2043mpo bid, 30030mo bid and finally 400m14m bid.  During these time points, her echocardiogram showed decreasing ejection fraction and systolic dysfunction.  She was ultimately admitted 05/11/15 after increasing dyspnea and 6 lb weight gain over a week led to diagnosis of pulmonary edema concommitent with an EF of 45-50%.  I was asked to see her today prior to discharge given her adequate diuresis and resolution of symptoms.  Prior to my evaluation, discharge today after completion of lasix and a delivery at 35 weeks was being planned by induction of labor.  Given my review of her records, it is clear to me that this patient has severe gestational hypertension/preeclampsia with pulmonary edema and cardiomyopathy.  I feel strongly that she should have delivery by 34 weeks.  I discussed with your patient that severe preeclampsia when stable is expectantly managed until 34 weeks. Given that she has yet to receive a course of antenatal corticosteroids, it is reasonable to initiate this course, complete  corticosteroids, and move toward delivery with a very thoughtful plan.    I would plan for her to be delivered in a facility that can ensure not only continuous fetal monitoring but also maternal telemetry antepartum, intrapartum and until 24-48 hours postpartum.  This is due to both her persistent tachycardia and cardiomyopathy albeit mild.  Maternal arrhythmias are an assortment of conditions in which stress hormone release are best avoided, namely catecholamines, as they can trigger an unstable ventricular rhythm.  Oftentimes, arrhythmias can lead to syncopal episodes or even death in context of pain.  Because of the severity and associated mortality risk, these patients are generally advised to avoid strenuous activities like competitive sports.  Similarly, laboring and the associated pain response could trigger arrhythmia.  Large fluid shifts as with hemorrhage incurred during a cesarean delivery poses risk for triggering arrhythmia.  While not commonly encountered, this condition is best managed obstetrically by placement of epidural early in labor to block the pain response release of catecholamines.  The maternal cardiac status should be closely and meticulously followed in labor by implementing telemetry monitoring of maternal heart rate and rhythm, continuous pulse oximetry, and close matching of fluid status (I/O's with adequate matching of hydration and urine output monitored by way of foley catheter).    I recommend that the patient have an assessment by anesthesiology for placement of early epidural; this assessment in my opinion is best done as a planning visit sometime in the third trimester.  To avoid excessive maternal expulsive forces, I recommended to your patient to "labor down" to an appropriate fetal station for an assisted second stage vaginal delivery by either forceps  or vacuum depending on practitioner preference.  Following delivery, I recommend continuing the telemetry for 24 hours  and pulse oximetry for 24 hours.  After this initial period of observation is without onset of CHF or arrhythmia, I would then simply obtain a pulse oximetry measurement routinely with vital signs every 4 hours until criteria for discharge are met.   Although I will reserve interval assessments to the discretion of cardiology at [facility], I feel it would be appropriate to obtain a consultation with available cardiology specialists replete with formal maternal echocardiogram to enable delivery planning with anesthesia, cardiology and maternal-fetal medicine.  Thereafter, she will likely need a repeat maternal echocardiogram on the day after delivery to reassess maternal cardiac function.   We discussed the ACOG criteria for allowing trial of labor for patients with previous cesarean section. These include: one prior low-transverse cesarean delivery (or two prior low-transverse cesareans with a prior vaginal delivery), a clinically adequate pelvis, and no other uterine scars or previous rupture. Additionally, a physician must be immediately available throughout labor and capable of performing emergency cesarean section with anesthesia and other personnel available for emergency cesarean delivery.   I talked about the likelihood of success (vaginal delivery) and the risks of attempting trial of labor. Although the published success rates may range as high as 80% for trial of labor after a previous cesarean section, a number of individual factors modify that overall rate.  In her case, she had a cesarean due to insufficient progress in labor at 5.5cm dilated (adequate trial is in question).  Making sure that your patient understood that it is only an estimation, I told her that her likelihood of success is probably in the range of around 60-65%, owing to (no history of labor dystocia but high maternal BMI). Conversely, I told her that there is a likelihood of at least 35-40% that she will require  cesarean delivery, even if she attempts trial of labor under appropriate circumstances. Moreover, her risk of undergoing surgery in context of maternal heart disease greatly outweighs the risk of TOLAC due to larger fluid shifts with increased requirement for hydration, increased risk of hemorrhage, increased inherent blood loss, and increased risk for transfusion with a cesarean.  I did talk about the risks of attempting VBAC. I outlined the increased risk of maternal and neonatal morbidity that accompanies failed VBAC, including hemorrhage, infection, etc. I spoke also about the possibility of uterine rupture with previous cesarean section. The rate of symptomatic uterine rupture appears to be about 0.5% with spontaneous, unaugmented labor and one prior cesarean section. Although a number of factors associated with uterine rupture have been identified (prostaglandin cervical ripening, oxytocin use, etc.), I explained that there are no accurate predictors of uterine rupture and that the first sign of complication is usually an abnormal FHR pattern or fetal bradycardia.   Finally, I we spoke about the issue of inducing labor in the face of previous cesarean section. I told her that patients are at lowest risk of uterine rupture with spontaneous labor, but the use of prostaglandin for ripening agents for induction appears to increase the rate of rupture, substantially so for the induction protocols. I told your patient that most clinicians in tertiary centers consider prostaglandin ripening to be contraindicated in the presence of a previous cesarean section.  Nonetheless, use of Foley bulb for mechanical dilation with low dose pitocin would be the best choice for her in my opinion should she have an unfavorable Bishop score.  Moreover, timed delivery at term to facilitate placement of early epidural for the aforementioned reasons is key for this patient.  Then, once a favorable Bishop score (6 or better) is  achieved, I would have anesthesia place the epidural prior to initiating the pitocin for induction.  This way, the catecholamine release in association with labor pains may be most fully avoided and, in turn, minimizing risk with delivery.  Again, cesarean delivery should be reserved for the usual obstetrical reasons. Moreover, her risk of undergoing surgery in context of maternal heart disease greatly outweighs the risk of TOLAC due to larger fluid shifts with increased requirement for hydration, increased risk of hemorrhage, increased inherent blood loss, and increased risk for transfusion with a cesarean.  Lastly, I find that this patient's family history is significant as she reports on that her paternal grandfather, paternal aunt, paternal uncle and brother (61yo) all had "enlarged hearts" with heart failure and had both "defibrillators and pacemakers" due to cardiomyopathy.  I find it imperative that we have formal evaluation with cardiology to help plan out her delivery and postpartum follow up appropriate as this patient is at high risk for decompensating immmediately after or shortly after delivery.  Although this was a lot of information for her to digest, your patient demonstrated adequate comprehension of my impressions, recommendations and the underlying rationale.  All of her questions were answered during the course of the consultation.   Summary of Recommendations for Delivery of this patient with SIUP at 99w5dcomplicated by severe preeclampsia/cardiomyopathy (EF 45-50%)/recent pulmonary edema that responded to lasix and now clinically resolved:  1. [facility] 2. I recommend a course of antenatal corticosteroids 3. Admission to a telemetry bed 4. Continuous external fetal heart rate monitoring 5. Preeclampsia labs q 12 hours or sooner if clinically indicated while expectantly managing through the steroid  window 6. [facility] as per cardiology's discretion 7. [facility] for [facility] 8. Formal anesthesiology consultation  9. Strict I/O's  10. Would start MgSO4 intrapartum (during labor induction) and continue for 24 hours postpartum 11. Telemetry in labor and for 1st 24 hours postpartum. 12.  Continuous pulse oximetry in labor and for 1st 12-24 hours postpartum.  Then q 4 hours with vitals postpartum. 13. Early epidural (ie, upon diagnosis of active labor or prior to starting pitocin for induction in context of a favorable cervix). 14.  TOLAC is recommended to reduce maternal risks 15. If cervical ripening is required, prostaglandins are best avoided in context of TOLAC.  However, ripening may be achieved with a foley bulb with low-dose pitocin until favorable Bishop score is achieved.  16. I recommend second-stage assisted vaginal delivery by either forceps or vacuum depending on practitioner preference.  Cesarean delivery should be reserved for usual obstetrical indications.  I spent in excess of 80 minutes in review of medical records, evaluation, and education of your patient in consultation.  More than 50% of this time was spent in direct face-to-face counseling.  It was a pleasure seeing your patient today in consultation.   [facility]  While I have discussed transfer with Dr. GHoy Morn[facility] primary OB. Thank you for allowing  [place] the opportunity to contribute to the care of your patient.   Page with questions.  Delman Cheadle Reymundo Poll, MD, MS, FACOG [employer] Assistant Professor, Maternal-Fetal Medicine

## 2015-05-12 NOTE — Progress Notes (Addendum)
MFM Consultation, Staff Note:  By way of consultation, I spoke to your patient about the management of pregnancy in context of hypertensive disorder of pregnancy (eg, preeclampsia) complicated by cardiomyopathy and pulmonary edema (now clinically resolved following diuresis with Lasix IV).    Review of the patient' s records and during direct discussion with the patient reveals that she began to have palpitations and tachycardia early in pregnancy.  She was started on metoprolol for rate control and was taken out of work on July 12 given concerns for evolving cardiomyopathy.  However, after 22 weeks she additionally developed new onset of hypertension with blood pressures in excess of 150/100 and persistent tachycardia/palpitations, leading to starting her on labetalol 178m po bid in August 2016.  Over the next several weeks, blood pressures persistently in excess of 150/100 led to increasing labetalol to 2043mpo bid, 30030mo bid and finally 400m14m bid.  During these time points, her echocardiogram showed decreasing ejection fraction and systolic dysfunction.  She was ultimately admitted 05/11/15 after increasing dyspnea and 6 lb weight gain over a week led to diagnosis of pulmonary edema concommitent with an EF of 45-50%.  I was asked to see her today prior to discharge given her adequate diuresis and resolution of symptoms.  Prior to my evaluation, discharge today after completion of lasix and a delivery at 35 weeks was being planned by induction of labor.  Given my review of her records, it is clear to me that this patient has severe gestational hypertension/preeclampsia with pulmonary edema and cardiomyopathy.  I feel strongly that she should have delivery by 34 weeks.  I discussed with your patient that severe preeclampsia when stable is expectantly managed until 34 weeks. Given that she has yet to receive a course of antenatal corticosteroids, it is reasonable to initiate this course, complete  corticosteroids, and move toward delivery with a very thoughtful plan.    I would plan for her to be delivered in a facility that can ensure not only continuous fetal monitoring but also maternal telemetry antepartum, intrapartum and until 24-48 hours postpartum.  This is due to both her persistent tachycardia and cardiomyopathy albeit mild.  Maternal arrhythmias are an assortment of conditions in which stress hormone release are best avoided, namely catecholamines, as they can trigger an unstable ventricular rhythm.  Oftentimes, arrhythmias can lead to syncopal episodes or even death in context of pain.  Because of the severity and associated mortality risk, these patients are generally advised to avoid strenuous activities like competitive sports.  Similarly, laboring and the associated pain response could trigger arrhythmia.  Large fluid shifts as with hemorrhage incurred during a cesarean delivery poses risk for triggering arrhythmia.  While not commonly encountered, this condition is best managed obstetrically by placement of epidural early in labor to block the pain response release of catecholamines.  The maternal cardiac status should be closely and meticulously followed in labor by implementing telemetry monitoring of maternal heart rate and rhythm, continuous pulse oximetry, and close matching of fluid status (I/O's with adequate matching of hydration and urine output monitored by way of foley catheter).    I recommend that the patient have an assessment by anesthesiology for placement of early epidural; this assessment in my opinion is best done as a planning visit sometime in the third trimester.  To avoid excessive maternal expulsive forces, I recommended to your patient to "labor down" to an appropriate fetal station for an assisted second stage vaginal delivery by either forceps  or vacuum depending on practitioner preference.  Following delivery, I recommend continuing the telemetry for 24 hours  and pulse oximetry for 24 hours.  After this initial period of observation is without onset of CHF or arrhythmia, I would then simply obtain a pulse oximetry measurement routinely with vital signs every 4 hours until criteria for discharge are met.   Although I will reserve interval assessments to the discretion of cardiology at [facility], I feel it would be appropriate to obtain a consultation with available cardiology specialists replete with formal maternal echocardiogram to enable delivery planning with anesthesia, cardiology and maternal-fetal medicine.  Thereafter, she will likely need a repeat maternal echocardiogram on the day after delivery to reassess maternal cardiac function.   We discussed the ACOG criteria for allowing trial of labor for patients with previous cesarean section. These include: one prior low-transverse cesarean delivery (or two prior low-transverse cesareans with a prior vaginal delivery), a clinically adequate pelvis, and no other uterine scars or previous rupture. Additionally, a physician must be immediately available throughout labor and capable of performing emergency cesarean section with anesthesia and other personnel available for emergency cesarean delivery.   I talked about the likelihood of success (vaginal delivery) and the risks of attempting trial of labor. Although the published success rates may range as high as 80% for trial of labor after a previous cesarean section, a number of individual factors modify that overall rate.  In her case, she had a cesarean due to insufficient progress in labor at 5.5cm dilated (adequate trial is in question).  Making sure that your patient understood that it is only an estimation, I told her that her likelihood of success is probably in the range of around 60-65%, owing to (no history of labor dystocia but high maternal BMI). Conversely, I told her that there is a likelihood of at least 35-40% that she will require  cesarean delivery, even if she attempts trial of labor under appropriate circumstances. Moreover, her risk of undergoing surgery in context of maternal heart disease greatly outweighs the risk of TOLAC due to larger fluid shifts with increased requirement for hydration, increased risk of hemorrhage, increased inherent blood loss, and increased risk for transfusion with a cesarean.  I did talk about the risks of attempting VBAC. I outlined the increased risk of maternal and neonatal morbidity that accompanies failed VBAC, including hemorrhage, infection, etc. I spoke also about the possibility of uterine rupture with previous cesarean section. The rate of symptomatic uterine rupture appears to be about 0.5% with spontaneous, unaugmented labor and one prior cesarean section. Although a number of factors associated with uterine rupture have been identified (prostaglandin cervical ripening, oxytocin use, etc.), I explained that there are no accurate predictors of uterine rupture and that the first sign of complication is usually an abnormal FHR pattern or fetal bradycardia.   Finally, I we spoke about the issue of inducing labor in the face of previous cesarean section. I told her that patients are at lowest risk of uterine rupture with spontaneous labor, but the use of prostaglandin for ripening agents for induction appears to increase the rate of rupture, substantially so for the induction protocols. I told your patient that most clinicians in tertiary centers consider prostaglandin ripening to be contraindicated in the presence of a previous cesarean section.  Nonetheless, use of Foley bulb for mechanical dilation with low dose pitocin would be the best choice for her in my opinion should she have an unfavorable Bishop score.  Moreover, timed delivery at term to facilitate placement of early epidural for the aforementioned reasons is key for this patient.  Then, once a favorable Bishop score (6 or better) is  achieved, I would have anesthesia place the epidural prior to initiating the pitocin for induction.  This way, the catecholamine release in association with labor pains may be most fully avoided and, in turn, minimizing risk with delivery.  Again, cesarean delivery should be reserved for the usual obstetrical reasons. Moreover, her risk of undergoing surgery in context of maternal heart disease greatly outweighs the risk of TOLAC due to larger fluid shifts with increased requirement for hydration, increased risk of hemorrhage, increased inherent blood loss, and increased risk for transfusion with a cesarean.  Lastly, I find that this patient's family history is significant as she reports on that her paternal grandfather, paternal aunt, paternal uncle and brother (61yo) all had "enlarged hearts" with heart failure and had both "defibrillators and pacemakers" due to cardiomyopathy.  I find it imperative that we have formal evaluation with cardiology to help plan out her delivery and postpartum follow up appropriate as this patient is at high risk for decompensating immmediately after or shortly after delivery.  Although this was a lot of information for her to digest, your patient demonstrated adequate comprehension of my impressions, recommendations and the underlying rationale.  All of her questions were answered during the course of the consultation.   Summary of Recommendations for Delivery of this patient with SIUP at 99w5dcomplicated by severe preeclampsia/cardiomyopathy (EF 45-50%)/recent pulmonary edema that responded to lasix and now clinically resolved:  [facility]. [facility] 2. I recommend a course of antenatal corticosteroids 3. Admission to a telemetry bed 4. Continuous external fetal heart rate monitoring 5. Preeclampsia labs q 12 hours or sooner if clinically indicated while expectantly managing through the steroid  window 6. [facility] as per cardiology's discretion 7. [facility] for [facility] 8. Formal anesthesiology consultation  9. Strict I/O's  10. Would start MgSO4 intrapartum (during labor induction) and continue for 24 hours postpartum 11. Telemetry in labor and for 1st 24 hours postpartum. 12.  Continuous pulse oximetry in labor and for 1st 12-24 hours postpartum.  Then q 4 hours with vitals postpartum. 13. Early epidural (ie, upon diagnosis of active labor or prior to starting pitocin for induction in context of a favorable cervix). 14.  TOLAC is recommended to reduce maternal risks 15. If cervical ripening is required, prostaglandins are best avoided in context of TOLAC.  However, ripening may be achieved with a foley bulb with low-dose pitocin until favorable Bishop score is achieved.  16. I recommend second-stage assisted vaginal delivery by either forceps or vacuum depending on practitioner preference.  Cesarean delivery should be reserved for usual obstetrical indications.  I spent in excess of 80 minutes in review of medical records, evaluation, and education of your patient in consultation.  More than 50% of this time was spent in direct face-to-face counseling.  It was a pleasure seeing your patient today in consultation.   [facility]  While I have discussed transfer with Dr. GHoy Morn[facility]. Thank you for allowing  [place] the opportunity to contribute to the care of your patient.   Page with questions.  Delman Cheadle Reymundo Poll, MD, MS, FACOG [employer] Assistant Professor, Maternal-Fetal Medicine

## 2015-05-12 NOTE — Progress Notes (Signed)
Pt denies complaints  AF, VSS Abd - gravid, NT  A/P:  33+[redacted] wks gestation Discussed pt with dr Sharlet Salina (MFM) rec transfer to [facility] for cardiac monitoring and delivery after steroid time Discussed with pt and family, agree with plan of care

## 2015-05-12 NOTE — Progress Notes (Signed)
Patient ID: Michelle Kane, female   DOB: 15-Aug-1985, 29 y.o.   MRN: [id number]    Subjective:  Breathing bettter   Objective:  Filed Vitals:   05/12/15 0526 05/12/15 0655 05/12/15 0700 05/12/15 0832  BP:    117/72  Pulse: 84  78 83  Temp:    97.8 F (36.6 C)  TempSrc:    Oral  Resp: 20   18  Height:      Weight:      SpO2: 93% 97% 98% 99%    Intake/Output from previous day:  Intake/Output Summary (Last 24 hours) at 05/12/15 0851 Last data filed at 05/12/15 0800  Gross per 24 hour  Intake    240 ml  Output    800 ml  Net   -560 ml    Physical Exam: Affect appropriate Overweight white female  HEENT: normal Neck supple with no adenopathy JVP normal no bruits no thyromegaly Lungs clear with no wheezing and good diaphragmatic motion Heart:  S1/S2 no murmur, no rub, gallop or click PMI normal Abdomen: benighn, BS positve, no tenderness, no AAA no bruit.  No HSM or HJR Distal pulses intact with no bruits No edema Neuro non-focal Skin warm and dry multiple tatoos  No muscular weakness   Lab Results: Basic Metabolic Panel:  Recent Labs  05/11/15 2000  NA 136  K 3.7  CL 112*  CO2 19*  GLUCOSE 85  BUN <5*  CREATININE 0.46  CALCIUM 8.7*   Liver Function Tests:  Recent Labs  05/11/15 2000  AST 13*  ALT 10*  ALKPHOS 100  BILITOT 0.3  PROT 5.8*  ALBUMIN 2.5*   Cardiac Enzymes:  Recent Labs  05/11/15 2240  TROPONINI 0.04*    Imaging: No results found.  Cardiac Studies:  ECG:  SR normal ECG    Telemetry:  NSR no arrhythmia 05/12/2015   Echo:  EF 45-50%   Medications:   . docusate sodium  100 mg Oral Daily  . furosemide  40 mg Intravenous BID  . labetalol  200 mg Oral BID  . mometasone-formoterol  2 puff Inhalation BID  . 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  Improved with salt restriction and Rx for BP.  Continue beta blocker and lasix  Will need F/u  echo post delivery Planned C -section in a few weeks.    Jenkins Rouge 05/12/2015, 8:51 AM

## 2015-05-15 ENCOUNTER — Other Ambulatory Visit: Payer: 59

## 2015-05-15 NOTE — Discharge Summary (Signed)
Obstetric Discharge Summary Reason for Admission: observation/evaluation Prenatal Procedures: ultrasound Intrapartum Procedures: na Postpartum Procedures: na Complications-Operative and Postpartum: na HEMOGLOBIN  Date Value Ref Range Status  05/05/2015 10.2* 12.0 - 15.0 g/dL Final   HCT  Date Value Ref Range Status  05/05/2015 29.6* 36.0 - 46.0 % Final    Physical Exam:  General: alert and cooperative Lochia: na Uterine Fundus: gravid Incision: na DVT Evaluation: No evidence of DVT seen on physical exam. Negative Homan's sign. No cords or calf tenderness.  Discharge Diagnoses: maternal tachcardia and SOB at 33.[redacted] weeks gestation  Discharge Information: Date: 05/15/2015 Activity: pelvic rest Diet: routine Medications: PNV Condition: stable Instructions: transferred to [facility] Discharge to: [facility]   Newborn Data:   Damien Fusi 05/15/2015, 8:22 AM

## 2015-05-17 ENCOUNTER — Ambulatory Visit: Payer: 59 | Admitting: Cardiology

## 2015-05-18 ENCOUNTER — Ambulatory Visit: Payer: 59 | Admitting: Cardiology

## 2015-05-18 DIAGNOSIS — J811 Chronic pulmonary edema: Secondary | ICD-10-CM | POA: Insufficient documentation

## 2015-05-18 DIAGNOSIS — O1410 Severe pre-eclampsia, unspecified trimester: Secondary | ICD-10-CM | POA: Insufficient documentation

## 2015-05-18 DIAGNOSIS — I429 Cardiomyopathy, unspecified: Secondary | ICD-10-CM | POA: Insufficient documentation

## 2015-05-22 ENCOUNTER — Ambulatory Visit (INDEPENDENT_AMBULATORY_CARE_PROVIDER_SITE_OTHER): Payer: Medicaid Other | Admitting: Cardiology

## 2015-05-22 ENCOUNTER — Encounter: Payer: Self-pay | Admitting: Cardiology

## 2015-05-22 ENCOUNTER — Encounter: Payer: 59 | Admitting: Family Medicine

## 2015-05-22 VITALS — BP 128/82 | HR 107 | Ht 66.0 in | Wt 251.0 lb

## 2015-05-22 DIAGNOSIS — R600 Localized edema: Secondary | ICD-10-CM

## 2015-05-22 DIAGNOSIS — R000 Tachycardia, unspecified: Secondary | ICD-10-CM | POA: Diagnosis not present

## 2015-05-22 DIAGNOSIS — I5023 Acute on chronic systolic (congestive) heart failure: Secondary | ICD-10-CM

## 2015-05-22 DIAGNOSIS — I10 Essential (primary) hypertension: Secondary | ICD-10-CM

## 2015-05-22 DIAGNOSIS — I509 Heart failure, unspecified: Secondary | ICD-10-CM

## 2015-05-22 DIAGNOSIS — I420 Dilated cardiomyopathy: Secondary | ICD-10-CM

## 2015-05-22 MED ORDER — FUROSEMIDE 40 MG PO TABS: 40.0000 mg | ORAL_TABLET | Freq: Every day | ORAL | Status: DC

## 2015-05-22 NOTE — Progress Notes (Signed)
Patient ID: Michelle Kane, female   DOB: [birth year], 29 y.o.   MRN: [id number]      Cardiology Office Note  Date:  05/22/2015   ID:  Michelle Kane, DOB June 29, 1986, MRN [id number]  PCP:  Arnette Norris, MD  Cardiologist:  [name], MD   Chief complain: Chest pain, SOB, palpitations   History of Present Illness: Michelle Kane is a 29 y.o. female, G3P2 who in the 17th week of her pregnancy  had developed palpitations associated with dizziness on occasions, CP and SOB. No syncope. FH of premature CAD, grandfather had MI, CABG in his 83', GGF died of CHF. Her bother and uncle also have dilated cardiomyopathy.  She had no prior cardiac history. She has had two prior pregnancies (ages 46, 64) with similar symptoms and no complications. She has no h/o HTN, HLP, thyroid problem. In 22th weeks pregnant, she felt tired all the time and has DOE, no orthopnea or PND. She has dependent LE edema at the end of the day that resolves by the next morning.  Her palpitations significantly improved, chest pain resolved since she was started on betablockers. She also developed hypertension but that has resolved after we switched from toprol XL to labetalol.  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 She is also had chest pains that were sharp, not related to activity, deep breathing would help. She was experiencing mild LE edema at the end of the days. She stopped working based on our recommendation. She checks her BP at home and it is always in the 140-155 range. She had never had elevated BP in the past or in the previous pregnancies.  She presented on 05/11/15 with acute CHF, significant orthopnea, LE edema, gaining 6 lbs in 3 days, she was admitted to the [facility] hospital and treated with diuretics that gave her some relief, but was transferred to [facility] on 05/15/2015 and induced to labor. Eventually delivered a healthy baby boy [name] at 34th week of pregnancy. The  baby is thriving. She is breastfeeding. Since the delivery she has lost 5 lbs, she feels better, improved DOE, no orthopnea, but persistent LE edema L>R with pain in the calf. No chest pain, continues to take labetalol, no diuretics.   Past Medical History  Diagnosis Date  . GERD (gastroesophageal reflux disease)   . Allergy-induced asthma   . 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 History of ovarian cyst   . Pelvic pain in female   . History of colon polyps   . Tachycardia   . Enlarged heart    Past Surgical History  Procedure Laterality Date  . Cesarean section  03-01-2008  . Im pinning right elbow fx  [place]  . Laparoscopy N/A 07/29/2014    Procedure: LAPAROSCOPY DIAGNOSTIC;  Surgeon: Cyril Mourning, MD;  [facility];  Service: Gynecology;  Laterality: N/A;   Current Outpatient Prescriptions  Medication Sig Dispense Refill  . labetalol (NORMODYNE) 200 MG tablet Take 200 mg by mouth 2 (two) times daily.    . diphenhydrAMINE (BENADRYL) 25 MG tablet Take 25 mg by mouth every 6 (six) hours as needed for allergies.    . famotidine-calcium carbonate-magnesium hydroxide (PEPCID COMPLETE) 10-800-165 MG CHEW chewable tablet Chew 2 tablets by mouth daily as needed (heartburn).    . Prenatal Vit-Fe Fumarate-FA (PRENATAL MULTIVITAMIN) TABS tablet Take 2 tablets by mouth daily at 12 noon.     Marland Kitchen  PROAIR HFA 108 (90 BASE) MCG/ACT inhaler Inhale 1-2 puffs into the lungs every 4 (four) hours as needed for wheezing or shortness of breath. 1 Inhaler 1   No current facility-administered medications for this visit.   Allergies:   Hydrocodone-acetaminophen and Vicodin   Social History:  The patient  reports that she has quit smoking. Her smoking use included Cigarettes. She has a 5 pack-year smoking history. She has never used smokeless tobacco. She reports that she does not drink alcohol or use illicit drugs.   Family History:  The patient's family  history includes Colon polyps in her mother; Crohn's disease in her mother; Hemophilia in her father and son; Hyperlipidemia in her father and mother.   ROS:  Please see the history of present illness.   Otherwise, review of systems are positive for none.   All other systems are reviewed and negative.   PHYSICAL EXAM: VS:  BP 128/82 mmHg  Pulse 107  Ht 5\' 6"  (1.676 m)  Wt 251 lb (113.853 kg)  BMI 40.53 kg/m2  SpO2 98%  LMP 09/18/2014 , BMI Body mass index is 40.53 kg/(m^2). GEN: Well nourished, well developed, in no acute distress HEENT: normal Neck: no JVD, carotid bruits, or masses Cardiac: RRR; no murmurs, rubs, or gallops,no edema  Respiratory:  clear to auscultation bilaterally, normal work of breathing GI: soft, nontender, nondistended, + BS MS: no deformity or atrophy Skin: warm and dry, no rash Neuro:  Strength and sensation are intact Psych: euthymic mood, full affect  EKG:  EKG is ordered today. The ekg ordered today demonstrates SR, 95 BPM, LVH  Recent Labs: 01/12/2015: TSH 1.50 05/05/2015: Hemoglobin 10.2*; Platelets 198 05/11/2015: ALT 10*; B Natriuretic Peptide 26.0; BUN <5*; Creatinine, Ser 0.46; Potassium 3.7; Sodium 136   Lipid Panel    Component Value Date/Time   CHOL 197 05/17/2014 0946   TRIG 175.0* 05/17/2014 0946   HDL 37.60* 05/17/2014 0946   CHOLHDL 5 05/17/2014 0946   VLDL 35.0 05/17/2014 0946   LDLCALC 124* 05/17/2014 0946    Wt Readings from Last 3 Encounters:  05/22/15 251 lb (113.853 kg)  05/12/15 255 lb 3.2 oz (115.758 kg)  05/05/15 260 lb (117.935 kg)   TTE: 01/12/15 - Left ventricle: The cavity size was mildly dilated. Systolic function was mildly reduced. The estimated ejection fraction was in the range of 45% to 50%. Wall motion was normal; there were no regional wall motion abnormalities. - Mitral valve: There was trivial regurgitation.  TTE: 02/16/2015 - Left ventricle: The cavity size was normal. Systolic function  was normal. The estimated ejection fraction was 55%. Wall motion was normal; there were no regional wall motion abnormalities. - Left atrium: The atrium was mildly dilated. - Atrial septum: No defect or patent foramen ovale was identified.    ASSESSMENT AND PLAN:  29 year old female G3P2 in 17th week of pregnancy  1. Acute on chronic systolic CHF - start lasix 40 mg po daily, its tricky as she is breastfeeding, so she has to continue hydrating well to be able to provide enough mild supply.  2. 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 We will monitor closely. Repeat echocardiogram in 1 month.  3. Hypertension -continue  labetalol to 200 mg po BID.  4. Chest pain - rather atypical, improved   5. Hyperlipidemia - we will recheck post pregnancy, diet only for now.   Follow up  in 2 weeks.   [name] 05/22/2015

## 2015-05-22 NOTE — Patient Instructions (Signed)
Medication Instructions:   START TAKING LASIX 40 MG ONCE DAILY      Testing/Procedures:  Your physician has requested that you have a lower  extremity venous duplex. This test is an ultrasound of the veins in the legs or arms. It looks at venous blood flow that carries blood from the heart to the legs or arms. Allow one hour for a Lower Venous exam. Allow thirty minutes for an Upper Venous exam. There are no restrictions or special instructions.  PATIENT NEEDS THIS SCHEDULED AS SOON AS POSSIBLE    Your physician has requested that you have an echocardiogram. Echocardiography is a painless test that uses sound waves to create images of your heart. It provides your doctor with information about the size and shape of your heart and how well your heart's chambers and valves are working. This procedure takes approximately one hour. There are no restrictions for this procedure.  PATIENT NEEDS TO HAVE THIS SCHEDULED FOR ONE MONTH OUT PER DR NELSON     Follow-Up:  2 WEEKS WITH DR NELSON---PLEASE SCHEDULE HER ON 06-06-15 WITH DR NELSON--OKAY TO DOUBLE BOOK       If you need a refill on your cardiac medications before your next appointment, please call your pharmacy.

## 2015-05-23 ENCOUNTER — Ambulatory Visit (HOSPITAL_COMMUNITY)
Admission: RE | Admit: 2015-05-23 | Discharge: 2015-05-23 | Disposition: A | Discharge status: Home / Self Care | Payer: Medicaid Other | Source: Ambulatory Visit | Attending: Cardiology | Admitting: Cardiology

## 2015-05-23 DIAGNOSIS — I509 Heart failure, unspecified: Secondary | ICD-10-CM

## 2015-05-23 DIAGNOSIS — R600 Localized edema: Secondary | ICD-10-CM | POA: Insufficient documentation

## 2015-05-23 DIAGNOSIS — R000 Tachycardia, unspecified: Secondary | ICD-10-CM | POA: Diagnosis not present

## 2015-06-06 ENCOUNTER — Ambulatory Visit (INDEPENDENT_AMBULATORY_CARE_PROVIDER_SITE_OTHER): Payer: Medicaid Other | Admitting: Cardiology

## 2015-06-06 ENCOUNTER — Encounter: Payer: Self-pay | Admitting: Cardiology

## 2015-06-06 ENCOUNTER — Ambulatory Visit (HOSPITAL_COMMUNITY): Payer: Medicaid Other | Attending: Internal Medicine

## 2015-06-06 VITALS — BP 124/70 | HR 100 | Ht 66.0 in | Wt 263.0 lb

## 2015-06-06 DIAGNOSIS — I509 Heart failure, unspecified: Secondary | ICD-10-CM | POA: Insufficient documentation

## 2015-06-06 DIAGNOSIS — R600 Localized edema: Secondary | ICD-10-CM | POA: Insufficient documentation

## 2015-06-06 DIAGNOSIS — I5023 Acute on chronic systolic (congestive) heart failure: Secondary | ICD-10-CM

## 2015-06-06 DIAGNOSIS — R000 Tachycardia, unspecified: Secondary | ICD-10-CM | POA: Diagnosis not present

## 2015-06-06 DIAGNOSIS — I5021 Acute systolic (congestive) heart failure: Secondary | ICD-10-CM | POA: Insufficient documentation

## 2015-06-06 DIAGNOSIS — I420 Dilated cardiomyopathy: Secondary | ICD-10-CM

## 2015-06-06 LAB — CBC WITH DIFFERENTIAL/PLATELET
Basophils Absolute: 0 10*3/uL (ref 0.0–0.1)
Basophils Relative: 0 % (ref 0–1)
Eosinophils Absolute: 0.5 10*3/uL (ref 0.0–0.7)
Eosinophils Relative: 8 % — ABNORMAL HIGH (ref 0–5)
HCT: 30.6 % — ABNORMAL LOW (ref 36.0–46.0)
Hemoglobin: 10.1 g/dL — ABNORMAL LOW (ref 12.0–15.0)
Lymphocytes Relative: 25 % (ref 12–46)
Lymphs Abs: 1.7 10*3/uL (ref 0.7–4.0)
MCH: 28.4 pg (ref 26.0–34.0)
MCHC: 33 g/dL (ref 30.0–36.0)
MCV: 86 fL (ref 78.0–100.0)
MPV: 9.5 fL (ref 8.6–12.4)
Monocytes Absolute: 0.4 10*3/uL (ref 0.1–1.0)
Monocytes Relative: 6 % (ref 3–12)
Neutro Abs: 4.1 10*3/uL (ref 1.7–7.7)
Neutrophils Relative %: 61 % (ref 43–77)
Platelets: 270 10*3/uL (ref 150–400)
RBC: 3.56 MIL/uL — ABNORMAL LOW (ref 3.87–5.11)
RDW: 14.2 % (ref 11.5–15.5)
WBC: 6.8 10*3/uL (ref 4.0–10.5)

## 2015-06-06 LAB — COMPREHENSIVE METABOLIC PANEL
ALT: 12 U/L (ref 6–29)
AST: 12 U/L (ref 10–30)
Albumin: 3.4 g/dL — ABNORMAL LOW (ref 3.6–5.1)
Alkaline Phosphatase: 58 U/L (ref 33–115)
BUN: 11 mg/dL (ref 7–25)
CO2: 24 mmol/L (ref 20–31)
Calcium: 8.6 mg/dL (ref 8.6–10.2)
Chloride: 106 mmol/L (ref 98–110)
Creat: 0.59 mg/dL (ref 0.50–1.10)
Glucose, Bld: 84 mg/dL (ref 65–99)
Potassium: 4.1 mmol/L (ref 3.5–5.3)
Sodium: 138 mmol/L (ref 135–146)
Total Bilirubin: 0.2 mg/dL (ref 0.2–1.2)
Total Protein: 6.3 g/dL (ref 6.1–8.1)

## 2015-06-06 MED ORDER — FLUTICASONE-SALMETEROL 250-50 MCG/DOSE IN AEPB: 1.0000 | INHALATION_SPRAY | Freq: Two times a day (BID) | RESPIRATORY_TRACT | Status: DC

## 2015-06-06 MED ORDER — FUROSEMIDE 10 MG/ML IJ SOLN
80.0000 mg | Freq: Once | INTRAMUSCULAR | Status: AC
  Administered 2015-06-06: 80 mg via INTRAVENOUS

## 2015-06-06 MED ORDER — CARVEDILOL 3.125 MG PO TABS: 3.1250 mg | ORAL_TABLET | Freq: Two times a day (BID) | ORAL | Status: DC

## 2015-06-06 MED ORDER — FUROSEMIDE 40 MG PO TABS: 40.0000 mg | ORAL_TABLET | Freq: Two times a day (BID) | ORAL | Status: DC

## 2015-06-06 MED ORDER — LISINOPRIL 2.5 MG PO TABS: 2.5000 mg | ORAL_TABLET | Freq: Every day | ORAL | Status: DC

## 2015-06-06 NOTE — Patient Instructions (Addendum)
Medication Instructions:   STOP TAKING LABETALOL NOW  START TAKING LISINOPRIL 2.5 MG ONCE DAILY  START TAKING CARVEDILOL 3.125 MG BY MOUTH TWICE DAILY   INCREASE YOUR LASIX TO 40 MG TWICE DAILY---TAKE ONE TABLET AT 8 AM AND 1 TABLET AT 2 PM  START TAKING ADVAIR INHALER 250/50 1 PUFF TWICE DAILY   DR Meda Coffee RECOMMENDS THAT YOU WEIGH YOURSELF DAILY IN THE MORNINGS AND CALL OUR OFFICE IF YOU HAVE ANY WEIGHT GAIN   Labwork:  TODAY-- CMET, CBC W DIFF, AND BNP   Testing/Procedures:  Your physician has requested that you have an echocardiogram. Echocardiography is a painless test that uses sound waves to create images of your heart. It provides your doctor with information about the size and shape of your heart and how well your heart's chambers and valves are working. This procedure takes approximately one hour. There are no restrictions for this procedure.  PER DR NELSON BILLY FROM ECHO WILL BE DOING YOUR ECHO TODAY FOR ACUTE CHF    Follow-Up:  1 WEEK WITH A NP/PA AT OUR OFFICE FOR FOLLOW-UP OF CHF    If you need a refill on your cardiac medications before your next appointment, please call your pharmacy.

## 2015-06-06 NOTE — Progress Notes (Signed)
Patient ID: Michelle Kane, female   DOB: 05-23-1986, 29 y.o.   MRN: [id number]      Cardiology Office Note  Date:  06/06/2015   ID:  Michelle Kane, DOB [birth year], MRN [id number]  PCP:  Arnette Norris, MD  Cardiologist:  [name], MD   Chief complain: Chest pain, SOB, palpitations   History of Present Illness:  Michelle Kane is a 29 y.o. female, G3P2 who in the 17th week of her pregnancy  had developed palpitations associated with dizziness on occasions, CP and SOB. No syncope. FH of premature CAD, grandfather had MI, CABG in his 58', GGF died of CHF. Her bother and uncle also have dilated cardiomyopathy.  She had no prior cardiac history. She has had two prior pregnancies (ages 65, 56) with similar symptoms and no complications. She has no h/o HTN, HLP, thyroid problem. In 22th weeks pregnant, she felt tired all the time and has DOE, no orthopnea or PND. She has dependent LE edema at the end of the day that resolves by the next morning.  Her palpitations significantly improved, chest pain resolved since she was started on betablockers. She also developed hypertension but that has resolved after we switched from toprol XL to labetalol.  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 She is also had chest pains that were sharp, not related to activity, deep breathing would help. She was experiencing mild LE edema at the end of the days. She stopped working based on our recommendation. She checks her BP at home and it is always in the 140-155 range. She had never had elevated BP in the past or in the previous pregnancies.  11/7/206 - She presented on 05/11/15 with acute CHF, significant orthopnea, LE edema, gaining 6 lbs in 3 days, she was admitted to the [facility] hospital and treated with diuretics that gave her some relief, but was transferred to [facility] on 05/15/2015 and induced to labor. Eventually delivered a healthy baby boy [name] at 34th week of  pregnancy. The baby is thriving. She is breastfeeding. Since the delivery she has lost 5 lbs, she feels better, improved DOE, no orthopnea, but persistent LE edema L>R with pain in the calf. No chest pain, continues to take labetalol, no diuretics.   06/06/2015 - the patient is coming today complaining of worsening SOB, PND and weight gain. She is 3 weeks postpartum and breastfeeding. She has noticed LE edema.    Past Medical History  Diagnosis Date  . GERD (gastroesophageal reflux disease)   . Allergy-induced asthma   . 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 History of ovarian cyst   . Pelvic pain in female   . History of colon polyps   . Tachycardia   . Enlarged heart    Past Surgical History  Procedure Laterality Date  . Cesarean section  03-01-2008  . Im pinning right elbow fx  [place]  . Laparoscopy N/A 07/29/2014    Procedure: LAPAROSCOPY DIAGNOSTIC;  Surgeon: Cyril Mourning, MD;  Location: [facility];  Service: Gynecology;  Laterality: N/A;   Current Outpatient Prescriptions  Medication Sig Dispense Refill  . diphenhydrAMINE (BENADRYL) 25 MG tablet Take 25 mg by mouth every 6 (six) hours as needed for allergies.    . famotidine-calcium carbonate-magnesium hydroxide (PEPCID COMPLETE) 10-800-165 MG CHEW chewable tablet Chew 2 tablets by mouth daily as needed (heartburn).    . furosemide (LASIX) 40 MG tablet  Take 1 tablet (40 mg total) by mouth daily. 90 tablet 3  . labetalol (NORMODYNE) 200 MG tablet Take 200 mg by mouth 2 (two) times daily.    . Prenatal Vit-Fe Fumarate-FA (PRENATAL MULTIVITAMIN) TABS tablet Take 2 tablets by mouth daily at 12 noon.     Marland Kitchen PROAIR HFA 108 (90 BASE) MCG/ACT inhaler Inhale 1-2 puffs into the lungs every 4 (four) hours as needed for wheezing or shortness of breath. 1 Inhaler 1   No current facility-administered medications for this visit.   Allergies:   Hydrocodone-acetaminophen and Vicodin   Social History:   The patient  reports that she has quit smoking. Her smoking use included Cigarettes. She has a 5 pack-year smoking history. She has never used smokeless tobacco. She reports that she does not drink alcohol or use illicit drugs.   Family History:  The patient's family history includes Colon polyps in her mother; Crohn's disease in her mother; Hemophilia in her father and son; Hyperlipidemia in her father and mother.   ROS:  Please see the history of present illness.   Otherwise, review of systems are positive for none.   All other systems are reviewed and negative.   PHYSICAL EXAM: VS:  BP 124/70 mmHg  Pulse 100  Ht 5\' 6"  (1.676 m)  Wt 263 lb (119.296 kg)  BMI 42.47 kg/m2  LMP 09/18/2014 , BMI Body mass index is 42.47 kg/(m^2). GEN: Well nourished, well developed, in no acute distress HEENT: normal, puffy face Neck: elevated JVD, carotid bruits, or masses Cardiac: RRR; no murmurs, rubs, or gallops, B/L LE edema  Respiratory: crackles at basis. MS: no deformity or atrophy Skin: warm and dry, no rash Neuro:  Strength and sensation are intact Psych: euthymic mood, full affect  EKG:  EKG is ordered today. The ekg ordered today demonstrates SR, 95 BPM, LVH  Recent Labs: 01/12/2015: TSH 1.50 05/05/2015: Hemoglobin 10.2*; Platelets 198 05/11/2015: ALT 10*; B Natriuretic Peptide 26.0; BUN <5*; Creatinine, Ser 0.46; Potassium 3.7; Sodium 136   Lipid Panel    Component Value Date/Time   CHOL 197 05/17/2014 0946   TRIG 175.0* 05/17/2014 0946   HDL 37.60* 05/17/2014 0946   CHOLHDL 5 05/17/2014 0946   VLDL 35.0 05/17/2014 0946   LDLCALC 124* 05/17/2014 0946    Wt Readings from Last 3 Encounters:  06/06/15 263 lb (119.296 kg)  05/22/15 251 lb (113.853 kg)  05/12/15 255 lb 3.2 oz (115.758 kg)   TTE: 01/12/15 - Left ventricle: The cavity size was mildly dilated. Systolic function was mildly reduced. The estimated ejection fraction was in the range of 45% to 50%. Wall motion was  normal; there were no regional wall motion abnormalities. - Mitral valve: There was trivial regurgitation.  TTE: 02/16/2015 - Left ventricle: The cavity size was normal. Systolic function was normal. The estimated ejection fraction was 55%. Wall motion was normal; there were no regional wall motion abnormalities. - Left atrium: The atrium was mildly dilated. - Atrial septum: No defect or patent foramen ovale was identified.    ASSESSMENT AND PLAN:  29 year old female G3P3 3 weeks postpartum  1. Acute on chronic systolic CHF - 12 lbs weight gain in 3 weeks, echocardiogram done in the clinic shows decreased LVEF now 40-45%, we will start Lasix iv 80 mg x 1 here in the clinic with monitoring. If she responds well, then discharge home, increase home lasix to 40 mg po BID. Check echocardiogram, if worsening weight gain and SOB, she should  go to the hospital.  We will check CMP, BNP, CBC today.  2. 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  3. Hypertension - controlled on labetalol.  4. Chest pain - rather atypical, improved   5. Hyperlipidemia - we will recheck post pregnancy, diet only for now.   Follow up in 1 week.   [name] 06/06/2015

## 2015-06-07 LAB — BRAIN NATRIURETIC PEPTIDE: Brain Natriuretic Peptide: 8.7 pg/mL (ref 0.0–100.0)

## 2015-06-12 NOTE — Progress Notes (Signed)
Cardiology Office Note   Date:  06/13/2015   ID:  Michelle LEMAITRE, DOB 03-08-1986, MRN [id number]   Patient Care Team: Lucille Passy, MD as PCP - General (Family Medicine) Dian Queen, MD as Consulting Physician (Obstetrics and Gynecology) [name], MD as Consulting Physician (Cardiology)    Chief Complaint  Patient presents with  . Follow-up  . Congestive Heart Failure     History of Present Illness: Michelle Kane is a 29 y.o. female with a hx of dilated CM and CHF.  At [redacted] weeks gestation, she developed acute CHF and was admitted to [facility] and transferred to [facility].  Her son was delivered early.  Of note she has a FHx of DCM.  Last seen by Dr. Ena Dawley last week with acute worsening of SOB, PND and weight gain.  Echo done in the office that day demonstrated EF 40-45% (report not in her chart).  She was given Lasix 80 mg IV x 1 and observed in the office.  She returns for FU.   Since last seen, she is doing much better.  The patient denies chest pain, shortness of breath, syncope, orthopnea, PND or significant pedal edema.    Studies/Reports Reviewed Today:  Venous Duplex 05/23/15 No DVT bilaterally  Echo 05/11/15 Global LV longitudinal strain is -19%, EF 50% to 55%. Wall motion was normal  Holter 04/03/15 Inappropriate sinus tachycardia for 14 hours each day. We will increase Labetalol to 200 mg po BID.   Past Medical History  Diagnosis Date  . GERD (gastroesophageal reflux disease)   . Allergy-induced asthma   . 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 History of ovarian cyst   . Pelvic pain in female   . History of colon polyps   . Tachycardia   . Enlarged heart     Past Surgical History  Procedure Laterality Date  . Cesarean section  03-01-2008  . Im pinning right elbow fx  [place]  . Laparoscopy N/A 07/29/2014    Procedure: LAPAROSCOPY DIAGNOSTIC;  Surgeon: Cyril Mourning, MD;  Location: [facility];   Service: Gynecology;  Laterality: N/A;     Current Outpatient Prescriptions  Medication Sig Dispense Refill  . carvedilol (COREG) 3.125 MG tablet Take 1 tablet (3.125 mg total) by mouth 2 (two) times daily. 180 tablet 3  . diphenhydrAMINE (BENADRYL) 25 MG tablet Take 25 mg by mouth every 6 (six) hours as needed for allergies.    . famotidine-calcium carbonate-magnesium hydroxide (PEPCID COMPLETE) 10-800-165 MG CHEW chewable tablet Chew 2 tablets by mouth daily as needed (heartburn).    . Fluticasone-Salmeterol (ADVAIR DISKUS) 250-50 MCG/DOSE AEPB Inhale 1 puff into the lungs 2 (two) times daily. 60 each 3  . furosemide (LASIX) 40 MG tablet Take 1 tablet (40 mg total) by mouth 2 (two) times daily. 90 tablet 3  . lisinopril (PRINIVIL,ZESTRIL) 2.5 MG tablet Take 1 tablet (2.5 mg total) by mouth daily. 90 tablet 3  . Prenatal Vit-Fe Fumarate-FA (PRENATAL MULTIVITAMIN) TABS tablet Take 2 tablets by mouth daily at 12 noon.     Marland Kitchen PROAIR HFA 108 (90 BASE) MCG/ACT inhaler Inhale 1-2 puffs into the lungs every 4 (four) hours as needed for wheezing or shortness of breath. 1 Inhaler 1   No current facility-administered medications for this visit.    Allergies:   Hydrocodone-acetaminophen and Vicodin    Social History:   Social History   Social History  . Marital Status:  Single    Spouse Name: N/A  . Number of Children: N/A  . Years of Education: N/A   Social History Main Topics  . Smoking status: Former Smoker -- 0.50 packs/day for 10 years    Types: Cigarettes  . Smokeless tobacco: Never Used  . Alcohol Use: No     Comment: social  . Drug Use: No  . Sexual Activity: Yes    Birth Control/ Protection: None   Other Topics Concern  . None   Social History Narrative     Family History:   Family History  Problem Relation Age of Onset  . Hyperlipidemia Mother   . Crohn's disease Mother   . Colon polyps Mother   . Hyperlipidemia Father   . Hemophilia Father   . Hemophilia Son         ROS:   Please see the history of present illness.   Review of Systems  HENT: Positive for congestion.   All other systems reviewed and are negative.     PHYSICAL EXAM: VS:  BP 110/66 mmHg  Pulse 86  Ht 5\' 6"  (1.676 m)  Wt 255 lb (115.667 kg)  BMI 41.18 kg/m2  LMP 09/18/2014    Wt Readings from Last 3 Encounters:  06/13/15 255 lb (115.667 kg)  06/06/15 263 lb (119.296 kg)  05/22/15 251 lb (113.853 kg)     GEN: Well nourished, well developed, in no acute distress HEENT: normal Neck: no JVD,   no masses Cardiac:  Normal S1/S2, RRR; no murmur ,  no rubs or gallops, no edema   Respiratory:  clear to auscultation bilaterally, no wheezing, rhonchi or rales. GI: soft, nontender, nondistended, + BS MS: no deformity or atrophy Skin: warm and dry  Neuro:  CNs II-XII intact, Strength and sensation are intact Psych: Normal affect   EKG:  EKG is ordered today.  It demonstrates:   NSR, HR 86, normal axis, QTc 452, no change from prior tracing.    Recent Labs: 01/12/2015: TSH 1.50 05/11/2015: B Natriuretic Peptide 26.0 06/06/2015: ALT 12; BUN 11; Creat 0.59; Hemoglobin 10.1*; Platelets 270; Potassium 4.1; Sodium 138    Lipid Panel    Component Value Date/Time   CHOL 197 05/17/2014 0946   TRIG 175.0* 05/17/2014 0946   HDL 37.60* 05/17/2014 0946   CHOLHDL 5 05/17/2014 0946   VLDL 35.0 05/17/2014 0946   LDLCALC 124* 05/17/2014 0946      ASSESSMENT AND PLAN:  1. Chronic systolic CHF:  Volume improved.  Weight down.  She is now NYHA 2-2b. She will continue current dose of Lasix.  Check FU BMET today.    2. Dilated CM:  Familial.  Notes indicate Limited Echo last week demonstrated EF 40-45%.  I cannot find the report in her chart.  Continue beta-blocker, ACE inhibitor.  BP too soft to advance dosages at this time.  She will likely need a repeat echo in 90 days.  Check BMET today.  If K+ low, consider Spironolactone.  We discussed the teratogenicity of ACE inhibitors.  She is  s/p tubal ligation.  3. HTN:  Controlled.   4. Hyperlipidemia:  Diet controlled.      Medication Changes: Current medicines are reviewed at length with the patient today.  Concerns regarding medicines are as outlined above.  The following changes have been made:   Discontinued Medications   No medications on file   Modified Medications   No medications on file   New Prescriptions   No medications on  file   Labs/ tests ordered today include:   Orders Placed This Encounter  Procedures  . Basic Metabolic Panel (BMET)  . EKG 12-Lead     Disposition:    FU with Dr. Ena Dawley 1 month.     Signed, Versie Starks, MHS 06/13/2015 12:51 PM    [facility] [address], [place], [place]  [address] Phone: [phone]; Fax: [phone]

## 2015-06-13 ENCOUNTER — Encounter: Payer: Self-pay | Admitting: Physician Assistant

## 2015-06-13 ENCOUNTER — Ambulatory Visit (INDEPENDENT_AMBULATORY_CARE_PROVIDER_SITE_OTHER): Payer: Medicaid Other | Admitting: Physician Assistant

## 2015-06-13 VITALS — BP 110/66 | HR 86 | Ht 66.0 in | Wt 255.0 lb

## 2015-06-13 DIAGNOSIS — I5022 Chronic systolic (congestive) heart failure: Secondary | ICD-10-CM | POA: Diagnosis not present

## 2015-06-13 DIAGNOSIS — I420 Dilated cardiomyopathy: Secondary | ICD-10-CM | POA: Diagnosis not present

## 2015-06-13 DIAGNOSIS — I10 Essential (primary) hypertension: Secondary | ICD-10-CM

## 2015-06-13 DIAGNOSIS — E785 Hyperlipidemia, unspecified: Secondary | ICD-10-CM | POA: Diagnosis not present

## 2015-06-13 LAB — BASIC METABOLIC PANEL
BUN: 14 mg/dL (ref 7–25)
CHLORIDE: 103 mmol/L (ref 98–110)
CO2: 24 mmol/L (ref 20–31)
CREATININE: 0.71 mg/dL (ref 0.50–1.10)
Calcium: 9.1 mg/dL (ref 8.6–10.2)
Glucose, Bld: 90 mg/dL (ref 65–99)
Potassium: 3.6 mmol/L (ref 3.5–5.3)
Sodium: 137 mmol/L (ref 135–146)

## 2015-06-13 NOTE — Patient Instructions (Signed)
Medication Instructions:  Your physician recommends that you continue on your current medications as directed. Please refer to the Current Medication list given to you today.   Labwork: TODAY:  BMET  Testing/Procedures: None ordered  Follow-Up: Your physician recommends that you schedule a follow-up appointment in:  [place]   Any Other Special Instructions Will Be Listed Below (If Applicable).     If you need a refill on your cardiac medications before your next appointment, please call your pharmacy.

## 2015-06-14 ENCOUNTER — Telehealth: Payer: Self-pay | Admitting: *Deleted

## 2015-06-14 NOTE — Telephone Encounter (Signed)
No answer on [phone], VM full on [phone]. I will try to reach pt later.

## 2015-06-21 ENCOUNTER — Other Ambulatory Visit (HOSPITAL_COMMUNITY): Payer: Medicaid Other

## 2015-07-05 ENCOUNTER — Emergency Department (HOSPITAL_COMMUNITY)
Admission: EM | Admit: 2015-07-05 | Discharge: 2015-07-05 | Disposition: A | Discharge status: Home / Self Care | Payer: Medicaid Other | Attending: Emergency Medicine | Admitting: Emergency Medicine

## 2015-07-05 ENCOUNTER — Telehealth: Payer: Self-pay | Admitting: Cardiology

## 2015-07-05 ENCOUNTER — Encounter (HOSPITAL_COMMUNITY): Payer: Self-pay | Admitting: Emergency Medicine

## 2015-07-05 ENCOUNTER — Emergency Department (HOSPITAL_COMMUNITY): Payer: Medicaid Other

## 2015-07-05 DIAGNOSIS — Z1401 Asymptomatic hemophilia A carrier: Secondary | ICD-10-CM | POA: Insufficient documentation

## 2015-07-05 DIAGNOSIS — Z79899 Other long term (current) drug therapy: Secondary | ICD-10-CM | POA: Insufficient documentation

## 2015-07-05 DIAGNOSIS — R000 Tachycardia, unspecified: Secondary | ICD-10-CM | POA: Diagnosis not present

## 2015-07-05 DIAGNOSIS — R140 Abdominal distension (gaseous): Secondary | ICD-10-CM | POA: Diagnosis not present

## 2015-07-05 DIAGNOSIS — R0989 Other specified symptoms and signs involving the circulatory and respiratory systems: Secondary | ICD-10-CM | POA: Insufficient documentation

## 2015-07-05 DIAGNOSIS — Z86010 Personal history of colonic polyps: Secondary | ICD-10-CM | POA: Insufficient documentation

## 2015-07-05 DIAGNOSIS — Z8719 Personal history of other diseases of the digestive system: Secondary | ICD-10-CM | POA: Diagnosis not present

## 2015-07-05 DIAGNOSIS — I509 Heart failure, unspecified: Secondary | ICD-10-CM | POA: Diagnosis not present

## 2015-07-05 DIAGNOSIS — Z7951 Long term (current) use of inhaled steroids: Secondary | ICD-10-CM | POA: Diagnosis not present

## 2015-07-05 DIAGNOSIS — Z8742 Personal history of other diseases of the female genital tract: Secondary | ICD-10-CM | POA: Insufficient documentation

## 2015-07-05 DIAGNOSIS — J45901 Unspecified asthma with (acute) exacerbation: Secondary | ICD-10-CM | POA: Diagnosis not present

## 2015-07-05 DIAGNOSIS — Z3202 Encounter for pregnancy test, result negative: Secondary | ICD-10-CM | POA: Insufficient documentation

## 2015-07-05 DIAGNOSIS — Z87891 Personal history of nicotine dependence: Secondary | ICD-10-CM | POA: Diagnosis not present

## 2015-07-05 DIAGNOSIS — R0602 Shortness of breath: Secondary | ICD-10-CM

## 2015-07-05 LAB — COMPREHENSIVE METABOLIC PANEL
ALT: 12 U/L — ABNORMAL LOW (ref 14–54)
ANION GAP: 8 (ref 5–15)
AST: 14 U/L — ABNORMAL LOW (ref 15–41)
Albumin: 3.2 g/dL — ABNORMAL LOW (ref 3.5–5.0)
Alkaline Phosphatase: 56 U/L (ref 38–126)
BILIRUBIN TOTAL: 0.3 mg/dL (ref 0.3–1.2)
BUN: 15 mg/dL (ref 6–20)
CO2: 25 mmol/L (ref 22–32)
Calcium: 8.9 mg/dL (ref 8.9–10.3)
Chloride: 107 mmol/L (ref 101–111)
Creatinine, Ser: 0.64 mg/dL (ref 0.44–1.00)
GFR calc Af Amer: >60 mL/min (ref >60)
Glucose, Bld: 94 mg/dL (ref 65–99)
POTASSIUM: 3.5 mmol/L (ref 3.5–5.1)
Sodium: 140 mmol/L (ref 135–145)
TOTAL PROTEIN: 6.3 g/dL — AB (ref 6.5–8.1)

## 2015-07-05 LAB — CBC WITH DIFFERENTIAL/PLATELET
Basophils Absolute: 0 10*3/uL (ref 0.0–0.1)
Basophils Relative: 0 %
Eosinophils Absolute: 0.4 10*3/uL (ref 0.0–0.7)
Eosinophils Relative: 5 %
HEMATOCRIT: 34.2 % — AB (ref 36.0–46.0)
Hemoglobin: 10.9 g/dL — ABNORMAL LOW (ref 12.0–15.0)
LYMPHS PCT: 29 %
Lymphs Abs: 2.4 10*3/uL (ref 0.7–4.0)
MCH: 27.3 pg (ref 26.0–34.0)
MCHC: 31.9 g/dL (ref 30.0–36.0)
MCV: 85.5 fL (ref 78.0–100.0)
MONO ABS: 0.4 10*3/uL (ref 0.1–1.0)
MONOS PCT: 4 %
NEUTROS ABS: 5.3 10*3/uL (ref 1.7–7.7)
Neutrophils Relative %: 62 %
Platelets: 258 10*3/uL (ref 150–400)
RBC: 4 MIL/uL (ref 3.87–5.11)
RDW: 14.2 % (ref 11.5–15.5)
WBC: 8.5 10*3/uL (ref 4.0–10.5)

## 2015-07-05 LAB — URINALYSIS, ROUTINE W REFLEX MICROSCOPIC
BILIRUBIN URINE: NEGATIVE
GLUCOSE, UA: NEGATIVE mg/dL
Hgb urine dipstick: NEGATIVE
KETONES UR: NEGATIVE mg/dL
LEUKOCYTES UA: NEGATIVE
NITRITE: NEGATIVE
PROTEIN: NEGATIVE mg/dL
Specific Gravity, Urine: 1.006 (ref 1.005–1.030)
pH: 7 (ref 5.0–8.0)

## 2015-07-05 LAB — POC URINE PREG, ED: PREG TEST UR: NEGATIVE

## 2015-07-05 LAB — I-STAT TROPONIN, ED: TROPONIN I, POC: 0 ng/mL (ref 0.00–0.08)

## 2015-07-05 LAB — BRAIN NATRIURETIC PEPTIDE: B NATRIURETIC PEPTIDE 5: 20.5 pg/mL (ref 0.0–100.0)

## 2015-07-05 MED ORDER — FUROSEMIDE 10 MG/ML IJ SOLN
80.0000 mg | Freq: Once | INTRAMUSCULAR | Status: AC
  Administered 2015-07-05: 80 mg via INTRAVENOUS
  Filled 2015-07-05: qty 8

## 2015-07-05 NOTE — Telephone Encounter (Signed)
Pt calling to request an earlier appt for today with Dr Meda Coffee, for increased LEE, increased SOB, and abdominal distention.  Pt states that she is taking all her cardiac meds as prescribed.  Pt states that she has maintained a very low sodium diet.  Pt states she is continuously gaining weight, and today her sob is the worst it has been in a while. Pt states she has gained 5 lbs in a 24 hour period, for she weighs herself daily, and logs this information. Pt states she has administered herself her rescue inhalers, but is still very sob.  Pt states "I feel like I need to have IV lasix again, as Dr Meda Coffee did last time I was in the office." Informed the pt that Dr Meda Coffee is out of the office the rest of the week, and being her symptoms have worsened and she has gained 5 lbs in a 24 hour time period, she should report to [facility] ER, for possible diuresis. Informed the pt that its too dangerous for her to wait until her OV with Dr Meda Coffee on 12/28.  Advised the pt to report there now, for she will be triaged as a high priority.  Advised the pt to have someone drive her.  Informed the pt that I will route this message to Dr Meda Coffee to make her aware of pts current health status, and recommendation to report the ED. Pt verbalized understanding and agrees with this plan.  Pt states she will go to the ER now.

## 2015-07-05 NOTE — ED Notes (Signed)
Pt transported to radiology.

## 2015-07-05 NOTE — ED Provider Notes (Signed)
CSN: [id number]     Arrival date & time 07/05/15  1636 History   First MD Initiated Contact with Patient 07/05/15 1657     Chief Complaint  Patient presents with  . Shortness of Breath     (Consider location/radiation/quality/duration/timing/severity/associated sxs/prior Treatment) Patient is a 29 y.o. female presenting with shortness of breath. The history is provided by the patient.  Shortness of Breath Severity:  Moderate Onset quality:  Gradual Duration:  1 day Timing:  Constant Chronicity:  Recurrent Context comment:  Pt has history of newly diagnosed CHF Relieved by:  Nothing Worsened by:  Exertion and activity Ineffective treatments:  Rest and inhaler Associated symptoms: no abdominal pain, no chest pain, no claudication, no cough, no diaphoresis, no ear pain, no fever, no headaches, no hemoptysis, no neck pain, no rash, no sore throat, no sputum production, no vomiting and no wheezing   Risk factors: obesity   Risk factors: no hx of PE/DVT and no prolonged immobilization     Past Medical History  Diagnosis Date  . GERD (gastroesophageal reflux disease)   . Allergy-induced asthma   . 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 History of ovarian cyst   . Pelvic pain in female   . History of colon polyps   . Tachycardia   . Enlarged heart   . CHF (congestive heart failure) [facility])    Past Surgical History  Procedure Laterality Date  . Cesarean section  03-01-2008  . Im pinning right elbow fx  [place]  . Laparoscopy N/A 07/29/2014    Procedure: LAPAROSCOPY DIAGNOSTIC;  Surgeon: Cyril Mourning, MD;  Location: [facility];  Service: Gynecology;  Laterality: N/A;   Family History  Problem Relation Age of Onset  . Hyperlipidemia Mother   . Crohn's disease Mother   . Colon polyps Mother   . Hyperlipidemia Father   . Hemophilia Father   . Hemophilia Son    Social History  Substance Use Topics  . Smoking status: Former Smoker -- 0.50  packs/day for 10 years    Types: Cigarettes  . Smokeless tobacco: Never Used  . Alcohol Use: No     Comment: social   OB History    Gravida Para Term Preterm AB TAB SAB Ectopic Multiple Living   4 2 2  1  1   2      Review of Systems  Constitutional: Positive for activity change and fatigue. Negative for fever and diaphoresis.  HENT: Negative for ear pain and sore throat.   Eyes: Negative for pain.  Respiratory: Positive for shortness of breath. Negative for cough, hemoptysis, sputum production, chest tightness and wheezing.   Cardiovascular: Negative for chest pain and claudication.  Gastrointestinal: Positive for abdominal distention. Negative for nausea, vomiting, abdominal pain and constipation.  Genitourinary: Negative for dysuria, flank pain, vaginal bleeding, vaginal discharge and pelvic pain.  Musculoskeletal: Negative for neck pain.  Skin: Negative for rash.  Neurological: Negative for dizziness, facial asymmetry, speech difficulty and headaches.  Psychiatric/Behavioral: Negative for confusion.      Allergies  Vicodin  Home Medications   Prior to Admission medications   Medication Sig Start Date End Date Taking? Authorizing Provider  carvedilol (COREG) 3.125 MG tablet Take 1 tablet (3.125 mg total) by mouth 2 (two) times daily. 06/06/15  Yes [name], MD  famotidine-calcium carbonate-magnesium hydroxide (PEPCID COMPLETE) 10-800-165 MG CHEW chewable tablet Chew 2 tablets by mouth daily as needed (heartburn).   Yes Historical  Provider, MD  Fluticasone-Salmeterol (ADVAIR DISKUS) 250-50 MCG/DOSE AEPB Inhale 1 puff into the lungs 2 (two) times daily. 06/06/15  Yes [name], MD  furosemide (LASIX) 40 MG tablet Take 1 tablet (40 mg total) by mouth 2 (two) times daily. 06/06/15  Yes [name], MD  lisinopril (PRINIVIL,ZESTRIL) 2.5 MG tablet Take 1 tablet (2.5 mg total) by mouth daily. 06/06/15  Yes [name], MD  PROAIR HFA 108 [phone] BASE)  MCG/ACT inhaler Inhale 1-2 puffs into the lungs every 4 (four) hours as needed for wheezing or shortness of breath. 09/14/14  Yes Jearld Fenton, NP   BP 112/73 mmHg  Pulse 89  Temp(Src) 98.2 F (36.8 C) (Oral)  Resp 19  Ht 5' 6.5" (1.689 m)  Wt 117.436 kg  BMI 41.17 kg/m2  SpO2 98%  LMP 09/18/2014  Breastfeeding? Unknown Physical Exam  Constitutional: She is oriented to person, place, and time. She appears well-developed and well-nourished. No distress.  HENT:  Head: Normocephalic and atraumatic.  Eyes: Conjunctivae and EOM are normal. Pupils are equal, round, and reactive to light.  Neck: Normal range of motion. Neck supple.  Cardiovascular: Normal rate and regular rhythm.  Exam reveals no gallop, no friction rub and no decreased pulses.   No murmur heard. Pulmonary/Chest: No accessory muscle usage. No tachypnea. No respiratory distress. She has rhonchi in the right lower field and the left lower field. She exhibits no tenderness.  Abdominal: She exhibits distension. There is no tenderness. There is no rigidity, no rebound, no guarding, no CVA tenderness, no tenderness at McBurney's point and negative Murphy's sign. No hernia.  Musculoskeletal: Normal range of motion.  Neurological: She is alert and oriented to person, place, and time. No cranial nerve deficit.  Skin: Skin is warm and dry.  Psychiatric: She has a normal mood and affect. Her speech is normal.    ED Course  Procedures (including critical care time) Labs Review Labs Reviewed  CBC WITH DIFFERENTIAL/PLATELET - Abnormal; Notable for the following:    Hemoglobin 10.9 (*)    HCT 34.2 (*)    All other components within normal limits  COMPREHENSIVE METABOLIC PANEL - Abnormal; Notable for the following:    Total Protein 6.3 (*)    Albumin 3.2 (*)    AST 14 (*)    ALT 12 (*)    All other components within normal limits  URINE CULTURE  BRAIN NATRIURETIC PEPTIDE  URINALYSIS, ROUTINE W REFLEX MICROSCOPIC (NOT AT [facility])   I-STAT TROPOININ, ED  POC URINE PREG, ED    Imaging Review Dg Chest 2 View  07/05/2015  CLINICAL DATA:  Excess fluids, shortness of breath and LEFT side chest pain for 2 months, history CHF, asthma, tachycardia, enlarged heart, former smoker EXAM: CHEST  2 VIEW COMPARISON:  07/27/2011 FINDINGS: Normal heart size, mediastinal contours, and pulmonary vascularity. Chronic bronchitic changes. No pulmonary infiltrate, pleural effusion or pneumothorax. No acute osseous findings. IMPRESSION: Chronic bronchitic changes without acute infiltrate. Electronically Signed   By: Lavonia Dana M.D.   On: 07/05/2015 17:44   I have personally reviewed and evaluated these images and lab results as part of my medical decision-making.   EKG Interpretation   Date/Time:  Wednesday July 05 2015 17:06:10 EST Ventricular Rate:  89 PR Interval:  159 QRS Duration: 105 QT Interval:  361 QTC Calculation: 439 R Axis:   30 Text Interpretation:  Sinus rhythm No significant change since last  tracing Confirmed by BEATON  MD, ROBERT ([id number])  on 07/05/2015 5:26:39 PM      MDM   Final diagnoses:  SOB (shortness of breath)  Acute on chronic congestive heart failure, unspecified congestive heart failure type [facility])    29 year old Caucasian female with past medical history dilated cardiomyopathy, CHF with EF of 40% presents in setting of shortness of breath. Patient reports symptoms have been present over the last day. She reports she feels her abdomen has increased in size. She additionally reports increasing weight of 5-8 pounds. Patient reports compliance with all medications reports some mild chest pain during shortness of breath episodes. She denies headaches, wheezing, nausea, vomiting, conservation, diarrhea, dysuria, fevers. Patient contacted cardiologist prior to coming to the emergency Department was advised to come to the emergency department for further evaluation.  On arrival patient was hemodynamically  stable and oxygen saturations were in the high 90s on room air. Wheezing on examination with mild rhonchi and lower lobes. Patient had no significant lower extremities swelling. Patient's abdomen was large without obvious edema. Setting of patient's history we'll obtain chest x-ray, EKG, BNP, troponin, BMP. We'll give patient 80 mg IV Lasix as that has been successful for patient in the past. Differential diagnosis includes CHF exacerbation, pneumonia, ACS.  Chest x-ray which I personally reviewed reveals no signs of fluid overloaded status. No signs of pneumonia. BNP is not significantly elevated. EKG without signs of acute ischemia or significant arrhythmia. No elevation in initial troponin. This patient has had no chest pain for several hours do not believe repeat troponin as necessary. Patient reported significant improvement in his symptoms with IV Lasix. No significant change in creatinine or elevation in white blood cell count.  In setting these findings this is likely CHF exacerbation. We'll discharge patient home at this time with plan to follow up with cardiology for further management of symptoms. Patient advised to increase daily Lasix prior to follow-up with cardiology. Patient given strict return precautions and stable at time of discharge. Patient and family in agreement with plan.  Attending has seen and evaluated patient and Dr. Audie Pinto is in agreement with plan.    Esaw Grandchild, MD 07/05/15 [id number]  Leonard Schwartz, MD 07/05/15 [phone]

## 2015-07-05 NOTE — ED Notes (Signed)
Pt here for SOB and fluid build up; pt sts has it post partum; pt sts SOB and fluid in upper abd area

## 2015-07-05 NOTE — Telephone Encounter (Signed)
Pt c/o Shortness Of Breath: STAT if SOB developed within the last 24 hours or pt is noticeably SOB on the phone  1. Are you currently SOB (can you hear that pt is SOB on the phone)? yes  2. How long have you been experiencing SOB? today  3. Are you SOB when sitting or when up moving around? both  4. Are you currently experiencing any other symptoms? Swelling around abdominal and and gained 5lbs

## 2015-07-05 NOTE — Discharge Instructions (Signed)

## 2015-07-07 LAB — URINE CULTURE

## 2015-07-09 ENCOUNTER — Emergency Department (HOSPITAL_BASED_OUTPATIENT_CLINIC_OR_DEPARTMENT_OTHER): Payer: Medicaid Other

## 2015-07-09 ENCOUNTER — Encounter (HOSPITAL_BASED_OUTPATIENT_CLINIC_OR_DEPARTMENT_OTHER): Payer: Self-pay | Admitting: Emergency Medicine

## 2015-07-09 ENCOUNTER — Emergency Department (HOSPITAL_BASED_OUTPATIENT_CLINIC_OR_DEPARTMENT_OTHER)
Admission: EM | Admit: 2015-07-09 | Discharge: 2015-07-10 | Disposition: A | Discharge status: Home / Self Care | Payer: Medicaid Other | Attending: Emergency Medicine | Admitting: Emergency Medicine

## 2015-07-09 DIAGNOSIS — R000 Tachycardia, unspecified: Secondary | ICD-10-CM | POA: Diagnosis not present

## 2015-07-09 DIAGNOSIS — M549 Dorsalgia, unspecified: Secondary | ICD-10-CM | POA: Diagnosis not present

## 2015-07-09 DIAGNOSIS — R0602 Shortness of breath: Secondary | ICD-10-CM | POA: Diagnosis present

## 2015-07-09 DIAGNOSIS — I509 Heart failure, unspecified: Secondary | ICD-10-CM | POA: Diagnosis not present

## 2015-07-09 DIAGNOSIS — Z7951 Long term (current) use of inhaled steroids: Secondary | ICD-10-CM | POA: Diagnosis not present

## 2015-07-09 DIAGNOSIS — Z87891 Personal history of nicotine dependence: Secondary | ICD-10-CM | POA: Insufficient documentation

## 2015-07-09 DIAGNOSIS — J45901 Unspecified asthma with (acute) exacerbation: Secondary | ICD-10-CM | POA: Diagnosis not present

## 2015-07-09 DIAGNOSIS — Z8742 Personal history of other diseases of the female genital tract: Secondary | ICD-10-CM | POA: Diagnosis not present

## 2015-07-09 DIAGNOSIS — Z79899 Other long term (current) drug therapy: Secondary | ICD-10-CM | POA: Insufficient documentation

## 2015-07-09 DIAGNOSIS — Z86010 Personal history of colonic polyps: Secondary | ICD-10-CM | POA: Insufficient documentation

## 2015-07-09 DIAGNOSIS — R0600 Dyspnea, unspecified: Secondary | ICD-10-CM

## 2015-07-09 DIAGNOSIS — R079 Chest pain, unspecified: Secondary | ICD-10-CM | POA: Insufficient documentation

## 2015-07-09 DIAGNOSIS — R1013 Epigastric pain: Secondary | ICD-10-CM | POA: Insufficient documentation

## 2015-07-09 DIAGNOSIS — R1110 Vomiting, unspecified: Secondary | ICD-10-CM | POA: Diagnosis not present

## 2015-07-09 DIAGNOSIS — Z9889 Other specified postprocedural states: Secondary | ICD-10-CM | POA: Insufficient documentation

## 2015-07-09 DIAGNOSIS — Z8719 Personal history of other diseases of the digestive system: Secondary | ICD-10-CM | POA: Diagnosis not present

## 2015-07-09 LAB — CBC WITH DIFFERENTIAL/PLATELET
BASOS ABS: 0 10*3/uL (ref 0.0–0.1)
BASOS PCT: 0 %
EOS PCT: 4 %
Eosinophils Absolute: 0.4 10*3/uL (ref 0.0–0.7)
HCT: 36.5 % (ref 36.0–46.0)
Hemoglobin: 11.7 g/dL — ABNORMAL LOW (ref 12.0–15.0)
Lymphocytes Relative: 20 %
Lymphs Abs: 2.3 10*3/uL (ref 0.7–4.0)
MCH: 27 pg (ref 26.0–34.0)
MCHC: 32.1 g/dL (ref 30.0–36.0)
MCV: 84.3 fL (ref 78.0–100.0)
MONO ABS: 0.7 10*3/uL (ref 0.1–1.0)
Monocytes Relative: 6 %
Neutro Abs: 8 10*3/uL — ABNORMAL HIGH (ref 1.7–7.7)
Neutrophils Relative %: 70 %
PLATELETS: 260 10*3/uL (ref 150–400)
RBC: 4.33 MIL/uL (ref 3.87–5.11)
RDW: 14.4 % (ref 11.5–15.5)
WBC: 11.4 10*3/uL — AB (ref 4.0–10.5)

## 2015-07-09 LAB — COMPREHENSIVE METABOLIC PANEL
ALBUMIN: 4 g/dL (ref 3.5–5.0)
ALK PHOS: 68 U/L (ref 38–126)
ALT: 15 U/L (ref 14–54)
AST: 16 U/L (ref 15–41)
Anion gap: 9 (ref 5–15)
BUN: 17 mg/dL (ref 6–20)
CALCIUM: 8.9 mg/dL (ref 8.9–10.3)
CHLORIDE: 99 mmol/L — AB (ref 101–111)
CO2: 29 mmol/L (ref 22–32)
CREATININE: 0.98 mg/dL (ref 0.44–1.00)
GFR calc Af Amer: >60 mL/min (ref >60)
GFR calc non Af Amer: >60 mL/min (ref >60)
GLUCOSE: 105 mg/dL — AB (ref 65–99)
Potassium: 3 mmol/L — ABNORMAL LOW (ref 3.5–5.1)
SODIUM: 137 mmol/L (ref 135–145)
Total Bilirubin: 0.5 mg/dL (ref 0.3–1.2)
Total Protein: 7.7 g/dL (ref 6.5–8.1)

## 2015-07-09 LAB — LIPASE, BLOOD: Lipase: 25 U/L (ref 11–51)

## 2015-07-09 LAB — TROPONIN I

## 2015-07-09 LAB — D-DIMER, QUANTITATIVE (NOT AT ARMC): D DIMER QUANT: 0.43 ug{FEU}/mL (ref 0.00–0.50)

## 2015-07-09 LAB — BRAIN NATRIURETIC PEPTIDE: B Natriuretic Peptide: 7.9 pg/mL (ref 0.0–100.0)

## 2015-07-09 MED ORDER — POTASSIUM CHLORIDE CRYS ER 20 MEQ PO TBCR
40.0000 meq | EXTENDED_RELEASE_TABLET | Freq: Once | ORAL | Status: AC
  Administered 2015-07-09: 40 meq via ORAL
  Filled 2015-07-09: qty 2

## 2015-07-09 NOTE — ED Notes (Signed)
Patient states that she is having SOB worse since she was seen 4 days ago at [facility]. The patient reports that she gets very SOB with excersion. The patient reports that when she eats she throws it up because she cant eat. The patient then reports that when she lies down it gets better she can "get it to subside" Patient is post partum 2 months and reports that this has been an on going problem for those 2 monther.

## 2015-07-09 NOTE — ED Provider Notes (Signed)
CSN: [id number]     Arrival date & time 07/09/15  2114 History  By signing my name below, I, Randa Evens, attest that this documentation has been prepared under the direction and in the presence of No att. providers found. Electronically Signed: Randa Evens, ED Scribe. 07/10/2015. 12:42 AM.      Chief Complaint  Patient presents with  . Shortness of Breath   The history is provided by the patient. No language interpreter was used.   HPI Comments: Michelle Kane is a 29 y.o. female who presents to the Emergency Department complaining of SOB  onset 1 day prior. Pt reports sharp CP and back pain. Also report intermittent abdominal pain, and vomiting. Pt reports that she has been compliant with her lasix with no relief. She states that her SOB is worse with exertion. Pt states that she is 2 months postpartum and has been dealing with breathing troubles since. Denies cough or leg swelling.  She was seen in the emergency department 4 days ago and her Lasix. Hold at that time. She has a history of cardiomyopathy that was diagnosed her pregnancy and had an emergency C-section due to acute pulmonary edema. She is followed by Dr. Meda Coffee with cardiology.    Past Medical History  Diagnosis Date  . GERD (gastroesophageal reflux disease)   . Allergy-induced asthma   . 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 History of ovarian cyst   . Pelvic pain in female   . History of colon polyps   . Tachycardia   . Enlarged heart   . CHF (congestive heart failure) [facility])    Past Surgical History  Procedure Laterality Date  . Cesarean section  03-01-2008  . Im pinning right elbow fx  [place]  . Laparoscopy N/A 07/29/2014    Procedure: LAPAROSCOPY DIAGNOSTIC;  Surgeon: Cyril Mourning, MD;  Location: [facility];  Service: Gynecology;  Laterality: N/A;   Family History  Problem Relation Age of Onset  . Hyperlipidemia Mother   . Crohn's disease Mother   . Colon  polyps Mother   . Hyperlipidemia Father   . Hemophilia Father   . Hemophilia Son    Social History  Substance Use Topics  . Smoking status: Former Smoker -- 0.50 packs/day for 10 years    Types: Cigarettes  . Smokeless tobacco: Never Used  . Alcohol Use: No     Comment: social   OB History    Gravida Para Term Preterm AB TAB SAB Ectopic Multiple Living   4 2 2  1  1   2      Review of Systems  Constitutional: Negative for fever.  Respiratory: Positive for shortness of breath. Negative for cough.   Cardiovascular: Positive for chest pain. Negative for leg swelling.  Gastrointestinal: Positive for vomiting and abdominal pain.  Musculoskeletal: Positive for back pain.      Allergies  Vicodin  Home Medications   Prior to Admission medications   Medication Sig Start Date End Date Taking? Authorizing Provider  carvedilol (COREG) 3.125 MG tablet Take 1 tablet (3.125 mg total) by mouth 2 (two) times daily. 06/06/15   [name], MD  famotidine-calcium carbonate-magnesium hydroxide (PEPCID COMPLETE) 10-800-165 MG CHEW chewable tablet Chew 2 tablets by mouth daily as needed (heartburn).    Historical Provider, MD  Fluticasone-Salmeterol (ADVAIR DISKUS) 250-50 MCG/DOSE AEPB Inhale 1 puff into the lungs 2 (two) times daily. 06/06/15   [name], MD  furosemide (LASIX) 40 MG tablet Take 1 tablet (40 mg total) by mouth 2 (two) times daily. 06/06/15   [name], MD  lisinopril (PRINIVIL,ZESTRIL) 2.5 MG tablet Take 1 tablet (2.5 mg total) by mouth daily. 06/06/15   [name], MD  PROAIR HFA 108 (90 BASE) MCG/ACT inhaler Inhale 1-2 puffs into the lungs every 4 (four) hours as needed for wheezing or shortness of breath. 09/14/14   Jearld Fenton, NP   BP 107/65 mmHg  Pulse 111  Temp(Src) 97.8 F (36.6 C) (Oral)  Resp 27  Ht 5' 6.75" (1.695 m)  Wt 250 lb (113.399 kg)  BMI 39.47 kg/m2  SpO2 97%  LMP 09/18/2014   Physical Exam  Constitutional: She is  oriented to person, place, and time. She appears well-developed and well-nourished.  HENT:  Head: Normocephalic and atraumatic.  Neck: No JVD present.  Cardiovascular: Regular rhythm.  Tachycardia present.   No murmur heard. Pulmonary/Chest: Effort normal and breath sounds normal. No respiratory distress.  Abdominal: Soft. There is tenderness (mild) in the epigastric area. There is no rebound and no guarding.  Musculoskeletal: She exhibits no edema or tenderness.  Neurological: She is alert and oriented to person, place, and time.  Skin: Skin is warm and dry.  Psychiatric: She has a normal mood and affect. Her behavior is normal.  Nursing note and vitals reviewed.   ED Course  Procedures (including critical care time) DIAGNOSTIC STUDIES: Oxygen Saturation is 96% on RA, normal by my interpretation.    COORDINATION OF CARE: 12:42 AM-Discussed treatment plan with pt at bedside and pt agreed to plan.     Labs Review Labs Reviewed  COMPREHENSIVE METABOLIC PANEL - Abnormal; Notable for the following:    Potassium 3.0 (*)    Chloride 99 (*)    Glucose, Bld 105 (*)    All other components within normal limits  CBC WITH DIFFERENTIAL/PLATELET - Abnormal; Notable for the following:    WBC 11.4 (*)    Hemoglobin 11.7 (*)    Neutro Abs 8.0 (*)    All other components within normal limits  LIPASE, BLOOD  TROPONIN I  BRAIN NATRIURETIC PEPTIDE  D-DIMER, QUANTITATIVE (NOT AT [facility])    Imaging Review Dg Chest 2 View  07/09/2015  CLINICAL DATA:  29 year old female with shortness of breath EXAM: CHEST  2 VIEW COMPARISON:  Radiograph dated 07/05/2015 FINDINGS: The heart size and mediastinal contours are within normal limits. Both lungs are clear. The visualized skeletal structures are unremarkable. IMPRESSION: No active cardiopulmonary disease. Electronically Signed   By: Anner Crete M.D.   On: 07/09/2015 21:38   I have personally reviewed and evaluated these images and lab results as  part of my medical decision-making.   EKG Interpretation   Date/Time:  Sunday July 09 2015 23:47:55 EST Ventricular Rate:  100 PR Interval:  154 QRS Duration: 98 QT Interval:  376 QTC Calculation: 485 R Axis:   9 Text Interpretation:  Normal sinus rhythm Moderate voltage criteria for  LVH, may be normal variant Prolonged QT Abnormal ECG Confirmed by Hazle Coca [phone]) on 07/09/2015 11:50:04 PM      MDM   Final diagnoses:  Dyspnea   Patient with history of cardiomyopathy here for evaluation of shortness of breath. She is nontoxic appearing on examination. There is no evidence of acute infectious process at this time. EKG is similar compared to priors. She is not currently volume overloaded and question some degree of mild dehydration. Discussed decreasing  her Lasix dose and resume a normal fluid intake. Plan to DC home with very close cardiology follow-up as well as return precautions.  I personally performed the services described in this documentation, which was scribed in my presence. The recorded information has been reviewed and is accurate.     Quintella Reichert, MD 07/10/15 [phone]

## 2015-07-09 NOTE — ED Notes (Signed)
Pt placed on cardiac monitor and continuous pulse ox.

## 2015-07-10 NOTE — Discharge Instructions (Signed)
Return to your regular lasix dose.  Follow up with cardiology.    Cardiomyopathy Cardiomyopathy is a long-term (chronic) disease of the heart muscle (myocardium). Over time, the heart becomes abnormally large, thick, or stiff. This makes it harder for the heart to pump blood and can lead to heart failure. There are several types of cardiomyopathy:  Dilated cardiomyopathy. This type causes the ventricles become large and weak.  Hypertrophic cardiomyopathy. This type causes the heart muscle to thicken.  Restrictive cardiomyopathy. This type causes the heart muscle to become rigid and less elastic.  Ischemic cardiomyopathy. This type involves narrowing arteries that cause the walls of the heart get thinner.  Peripartum cardiomyopathy. This type occurs during pregnancy or shortly after pregnancy. CAUSES The cause of cardiomyopathy is often not known. In some cases, it is passed down (inherited) from a family member who also had cardiomyopathy. The disease may develop as a complication of another medical condition. These conditions can include:  Diabetes.  High blood pressure.  Viral infection of the heart.  Heart attack.  Coronary heart disease. RISK FACTORS You may be more likely to develop cardiomyopathy if you:  Have a family history of cardiomyopathy or other heart problems.  Are overweight or obese.  Use illegal drugs.  Abuse alcohol.  Have diabetes.  Have another disease that can cause cardiomyopathy as a complication. SIGNS AND SYMPTOMS Often, cardiomyopathy has no signs or symptoms. If you do have symptoms, they may include:  Shortness of breath, especially during activity.  Fatigue.  An irregular heartbeat (arrhythmia).  Dizziness, light-headedness, or fainting.  Chest pain.  Swelling in the lower leg or ankle. DIAGNOSIS Your health care provider may suspect cardiomyopathy based on your symptoms and medical history. Your health care provider will also do a  physical exam. Other tests done may include:  Blood tests.  Imaging studies of your heart. These may be done using:  X-rays to check if your heart is enlarged.  Echocardiogram to show the size of your heart and how well it pumps.  MRI.  A test to record the electrical activity of your heart (electrocardiogram or ECG).  A test in which you wear a portable device (event monitor) to record your heart's electrical activity while you go about your day.  A test to monitor your heart's activity while you exercise (stress test).   A procedure to check the blood pressure and blood flow in your heart(cardiac catheterization).  Injection of dye into your arteries before imaging studies are taken (angiogram).  Removal of a sample of heart tissue (biopsy). The sample is examined for problems. TREATMENT Treatment depends on the type of cardiomyopathy you have and the severity of your symptoms. If you are not having any symptoms, you might not need treatment. If you need treatment, it may include:  Lifestyle changes.  Quit smoking, if you smoke.  Maintain a healthy weight. Lose weight if directed by your health care provider.  Eat a healthy diet. Include plenty of fruits, vegetables, and whole grains.  Get regular exercise. Ask your health care provider to suggest some activities that are good for you.  Medicine. You may need to take medicine to:  Lower your blood pressure.  Slow down your heart rate.  Keep your heart beating in a steady rhythm.  Clear excess fluids from your body.  Prevent blood clots.  Surgery. You may need surgery to:  Repair a defect.  Remove thickened tissue.  Implant a device to treat serious heart rhythm problems (  implantable cardioverter-defibrillator or ICD).  Replace your heart (heart transplant) if all other treatments have failed (end stage). HOME CARE INSTRUCTIONS  Take medicines only as directed by your health care provider.  Eat a  heart-healthy diet. Work with your health care provider or a registered dietitian to learn about healthy eating options.  Maintain a healthy weight and stay physically active.  Do not use any tobacco products, including cigarettes, chewing tobacco, or electronic cigarettes. If you need help quitting, ask your health care provider.  Work closely with your health care provider to manage chronic conditions, such as diabetes and high blood pressure.  Limit alcohol intake to no more than one drink per day for nonpregnant women and no more than two drinks per day for men. One drink equals 12 ounces of beer, 5 ounces of wine, or 1 ounces of hard liquor.  Try to get at least 7 hours of sleep each night.  Find ways to manage stress.  Keep all follow-up visits as directed by your health care provider. This is important. SEEK MEDICAL CARE IF:  Your symptoms get worse, even after treatment.  You have new symptoms. SEEK IMMEDIATE MEDICAL CARE IF:  You have severe chest pain.  You have shortness of breath.  You cough up pink, bubbly material.  You have sudden sweating.  You feel nauseous and vomit.  You suddenly become light-headed or dizzy.  You feel your heart beating very fast.  It feels like your heart is skipping beats. These symptoms may represent a serious problem that is an emergency. Do not wait to see if the symptoms will go away. Get medical help right away. Call your local emergency services (911 in the [place] Do not drive yourself to the hospital.   This information is not intended to replace advice given to you by your health care provider. Make sure you discuss any questions you have with your health care provider.   Document Released: 09/13/2004 Document Revised: 07/22/2014 Document Reviewed: 12/09/2013 Elsevier Interactive Patient Education [employer].

## 2015-07-12 ENCOUNTER — Encounter: Payer: Self-pay | Admitting: Cardiology

## 2015-07-12 ENCOUNTER — Ambulatory Visit (INDEPENDENT_AMBULATORY_CARE_PROVIDER_SITE_OTHER): Payer: Medicaid Other | Admitting: Cardiology

## 2015-07-12 VITALS — BP 108/80 | HR 86 | Ht 66.75 in | Wt 258.4 lb

## 2015-07-12 DIAGNOSIS — I509 Heart failure, unspecified: Secondary | ICD-10-CM

## 2015-07-12 DIAGNOSIS — R072 Precordial pain: Secondary | ICD-10-CM

## 2015-07-12 DIAGNOSIS — J4521 Mild intermittent asthma with (acute) exacerbation: Secondary | ICD-10-CM | POA: Diagnosis not present

## 2015-07-12 DIAGNOSIS — R931 Abnormal findings on diagnostic imaging of heart and coronary circulation: Secondary | ICD-10-CM

## 2015-07-12 DIAGNOSIS — I429 Cardiomyopathy, unspecified: Secondary | ICD-10-CM

## 2015-07-12 DIAGNOSIS — R61 Generalized hyperhidrosis: Secondary | ICD-10-CM

## 2015-07-12 DIAGNOSIS — Z8679 Personal history of other diseases of the circulatory system: Secondary | ICD-10-CM

## 2015-07-12 DIAGNOSIS — R0602 Shortness of breath: Secondary | ICD-10-CM

## 2015-07-12 DIAGNOSIS — E876 Hypokalemia: Secondary | ICD-10-CM

## 2015-07-12 LAB — CBC WITH DIFFERENTIAL/PLATELET
Basophils Absolute: 0 10*3/uL (ref 0.0–0.1)
Basophils Relative: 0 % (ref 0–1)
Eosinophils Absolute: 0.5 10*3/uL (ref 0.0–0.7)
Eosinophils Relative: 6 % — ABNORMAL HIGH (ref 0–5)
HCT: 34.2 % — ABNORMAL LOW (ref 36.0–46.0)
Hemoglobin: 11.2 g/dL — ABNORMAL LOW (ref 12.0–15.0)
Lymphocytes Relative: 29 % (ref 12–46)
Lymphs Abs: 2.5 10*3/uL (ref 0.7–4.0)
MCH: 27.3 pg (ref 26.0–34.0)
MCHC: 32.7 g/dL (ref 30.0–36.0)
MCV: 83.4 fL (ref 78.0–100.0)
MPV: 9.7 fL (ref 8.6–12.4)
Monocytes Absolute: 0.3 10*3/uL (ref 0.1–1.0)
Monocytes Relative: 4 % (ref 3–12)
Neutro Abs: 5.2 10*3/uL (ref 1.7–7.7)
Neutrophils Relative %: 61 % (ref 43–77)
Platelets: 282 10*3/uL (ref 150–400)
RBC: 4.1 MIL/uL (ref 3.87–5.11)
RDW: 15 % (ref 11.5–15.5)
WBC: 8.6 10*3/uL (ref 4.0–10.5)

## 2015-07-12 LAB — BASIC METABOLIC PANEL
BUN: 9 mg/dL (ref 7–25)
CO2: 25 mmol/L (ref 20–31)
Calcium: 8.5 mg/dL — ABNORMAL LOW (ref 8.6–10.2)
Chloride: 105 mmol/L (ref 98–110)
Creat: 0.6 mg/dL (ref 0.50–1.10)
Glucose, Bld: 79 mg/dL (ref 65–99)
Potassium: 3.9 mmol/L (ref 3.5–5.3)
Sodium: 141 mmol/L (ref 135–146)

## 2015-07-12 LAB — TSH: TSH: 1.493 u[IU]/mL (ref 0.350–4.500)

## 2015-07-12 MED ORDER — PROAIR HFA 108 (90 BASE) MCG/ACT IN AERS: 1.0000 | INHALATION_SPRAY | RESPIRATORY_TRACT | Status: DC | PRN

## 2015-07-12 NOTE — Patient Instructions (Signed)
Medication Instructions:   START USING YOUR ALBUTEROL INHALER 1-2 PUFFS EVERY 4 HOURS AS NEEDED FOR WHEEZING, COUGHING AND SOB   Labwork:  TODAY---BMET, CBC W DIFF, AND TSH   Testing/Procedures:  Your physician has requested that you have an echocardiogram. Echocardiography is a painless test that uses sound waves to create images of your heart. It provides your doctor with information about the size and shape of your heart and how well your heart's chambers and valves are working. This procedure takes approximately one hour. There are no restrictions for this procedure.  DR Marcelle Overlie LIKE FOR THIS ECHO TO BE DONE TOMORROW 07/13/15   You have been referred to PULMONOLOGY FOR YOUR SOB   Follow-Up:  ONE WEEK WITH AN EXTENDER IN OUR OFFICE    If you need a refill on your cardiac medications before your next appointment, please call your pharmacy.

## 2015-07-12 NOTE — Progress Notes (Signed)
Patient ID: Michelle Kane, female   DOB: 1985/08/10, 29 y.o.   MRN: [id number]      Cardiology Office Note  Date:  07/12/2015   ID:  Michelle Kane, DOB 1986/04/08, MRN [id number]  PCP:  Arnette Norris, MD  Cardiologist:  [name], MD   Chief complain: Chest pain, DOE, post ER visit in 3 days   History of Present Illness:  Michelle Kane is a 29 y.o. female, who presented at 17th week of pregnancy with CP and SOB and was diagnosed with cardiomyopathy, LVEF 45-50%. She has had two prior pregnancies (ages 60, 56) with similar symptoms and no complications. She has no h/o HTN, HLP, thyroid problem. She was treated with betablocker for hypertension and palpitations during pregnancy.  On 05/11/15 with acute CHF, significant orthopnea, LE edema, gaining 6 lbs in 3 days, she was admitted to the [facility] hospital and treated with diuretics that gave her some relief, but was transferred to [facility] on 05/15/2015 and induced to labor. Eventually delivered a healthy baby boy Michelle Kane at 34th week of pregnancy. The baby is thriving. She originally improved, lost weight, but few weeks post delivery felt SOB again. She presented again to the clinic with significant fluid overload and was diuresed 8 lbs in the clinic. She was started on home diuretics and ACEI and she stopped breastfeeding, her LVEF on most recent echo on 06/06/15 was 40-45%.  In the last 2 weeks she presented to the ER with ongoing DOE, she feels better when at rest and when laying down, denies orthopnea, PND. Dyspnea with minimal exertion, no LE edema. She continues to gain weight, no cough, no fever, but significant night sweats. She has been prescribed inhalers in the past but she doesn't have them because of cost.   Past Medical History  Diagnosis Date  . GERD (gastroesophageal reflux disease)   . Allergy-induced asthma   . 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 History of ovarian cyst   . Pelvic pain in female   . History of  colon polyps   . Tachycardia   . Enlarged heart   . CHF (congestive heart failure) [facility])    Past Surgical History  Procedure Laterality Date  . Cesarean section  03-01-2008  . Im pinning right elbow fx  [place]  . Laparoscopy N/A 07/29/2014    Procedure: LAPAROSCOPY DIAGNOSTIC;  Surgeon: Cyril Mourning, MD;  Location: [facility];  Service: Gynecology;  Laterality: N/A;   Current Outpatient Prescriptions  Medication Sig Dispense Refill  . carvedilol (COREG) 3.125 MG tablet Take 1 tablet (3.125 mg total) by mouth 2 (two) times daily. 180 tablet 3  . famotidine-calcium carbonate-magnesium hydroxide (PEPCID COMPLETE) 10-800-165 MG CHEW chewable tablet Chew 2 tablets by mouth daily as needed (heartburn).    . Fluticasone-Salmeterol (ADVAIR DISKUS) 250-50 MCG/DOSE AEPB Inhale 1 puff into the lungs 2 (two) times daily. 60 each 3  . furosemide (LASIX) 40 MG tablet Take 1 tablet (40 mg total) by mouth 2 (two) times daily. 90 tablet 3  . lisinopril (PRINIVIL,ZESTRIL) 2.5 MG tablet Take 1 tablet (2.5 mg total) by mouth daily. 90 tablet 3  . PROAIR HFA 108 (90 BASE) MCG/ACT inhaler Inhale 1-2 puffs into the lungs every 4 (four) hours as needed for wheezing or shortness of breath. 1 Inhaler 1   No current facility-administered medications for this visit.   Allergies:   Vicodin   Social History:  The patient  reports that she has quit smoking. Her smoking use included Cigarettes. She has a 5 pack-year smoking history. She has never used smokeless tobacco. She reports that she does not drink alcohol or use illicit drugs.   Family History:  The patient's family history includes Colon polyps in her mother; Crohn's disease in her mother; Hemophilia in her father and son; Hyperlipidemia in her father and mother.   FH of premature CAD, grandfather had MI, CABG in his 82', GGF died of CHF. Her bother and uncle also have dilated cardiomyopathy.   ROS:  Please see the  history of present illness.   Otherwise, review of systems are positive for none.   All other systems are reviewed and negative.   PHYSICAL EXAM: VS:  BP 108/80 mmHg  Pulse 86  Ht 5' 6.75" (1.695 m)  Wt 258 lb 6.4 oz (117.209 kg)  BMI 40.80 kg/m2  LMP 09/18/2014 , BMI Body mass index is 40.8 kg/(m^2). GEN: Well nourished, well developed, in no acute distress HEENT: normal, normal JVD, carotid bruits, or masses Cardiac: RRR; no murmurs, rubs, or gallops, no edema  Respiratory: inspiratory stridor and exspiratory wheezes MS: no deformity or atrophy Skin: warm and dry, no rash Neuro:  Strength and sensation are intact Psych: euthymic mood, full affect  EKG:  EKG is ordered today. The ekg ordered today demonstrates SR, 95 BPM, LVH  Recent Labs: 01/12/2015: TSH 1.50 07/09/2015: ALT 15; B Natriuretic Peptide 7.9; BUN 17; Creatinine, Ser 0.98; Hemoglobin 11.7*; Platelets 260; Potassium 3.0*; Sodium 137   Lipid Panel    Component Value Date/Time   CHOL 197 05/17/2014 0946   TRIG 175.0* 05/17/2014 0946   HDL 37.60* 05/17/2014 0946   CHOLHDL 5 05/17/2014 0946   VLDL 35.0 05/17/2014 0946   LDLCALC 124* 05/17/2014 0946    Wt Readings from Last 3 Encounters:  07/12/15 258 lb 6.4 oz (117.209 kg)  07/09/15 250 lb (113.399 kg)  07/05/15 258 lb 14.4 oz (117.436 kg)   TTE: 01/12/15 - Left ventricle: The cavity size was mildly dilated. Systolic function was mildly reduced. The estimated ejection fraction was in the range of 45% to 50%. Wall motion was normal; there were no regional wall motion abnormalities. - Mitral valve: There was trivial regurgitation.  TTE: 02/16/2015 - Left ventricle: The cavity size was normal. Systolic function was normal. The estimated ejection fraction was 55%. Wall motion was normal; there were no regional wall motion abnormalities. - Left atrium: The atrium was mildly dilated. - Atrial septum: No defect or patent foramen ovale was  identified.    ASSESSMENT AND PLAN:  29 year old female G3P3 3 weeks postpartum  1. DOE - not typical for CHF, in the past she had LE edema, lung crackles, orthopnea, right now she has stridor, wheezes, no crackles, no LE edema,  CXR on 12/25 was normal. BNP 8 - completely normal. I am suspicious that her problem is not cardiac but pulmonary or ENT, I will prescribe albuterol, refer to pulmonary.  Her WBC was elevated in the ER, I will repeat CBC and consider antibiotics if elevated for posssible bronchitis.  2. Chronic systolic CHF with dilated CMP- 12 lbs weight gain in 3 weeks, echocardiogram done in the clinic shows decreased LVEF now 40-45% ( overestimated by the reader), I will continue the same dose of lasix, but as stated above I doubt that her problem is cardiac.  3. Hypokalemia - repeat today.  4. Night sweats - check TSH.   5.  Hypertension - controlled on carvedilol.  6. Hyperlipidemia - we will recheck post pregnancy, diet only for now.   Follow up in 1 week.   [name] 07/12/2015

## 2015-07-13 ENCOUNTER — Other Ambulatory Visit: Payer: Self-pay

## 2015-07-13 ENCOUNTER — Telehealth: Payer: Self-pay | Admitting: Cardiology

## 2015-07-13 ENCOUNTER — Ambulatory Visit (HOSPITAL_COMMUNITY): Payer: Medicaid Other | Attending: Cardiology

## 2015-07-13 ENCOUNTER — Telehealth: Payer: Self-pay | Admitting: *Deleted

## 2015-07-13 DIAGNOSIS — J4521 Mild intermittent asthma with (acute) exacerbation: Secondary | ICD-10-CM | POA: Insufficient documentation

## 2015-07-13 DIAGNOSIS — R072 Precordial pain: Secondary | ICD-10-CM | POA: Diagnosis not present

## 2015-07-13 DIAGNOSIS — R931 Abnormal findings on diagnostic imaging of heart and coronary circulation: Secondary | ICD-10-CM | POA: Insufficient documentation

## 2015-07-13 DIAGNOSIS — I340 Nonrheumatic mitral (valve) insufficiency: Secondary | ICD-10-CM | POA: Diagnosis not present

## 2015-07-13 DIAGNOSIS — I429 Cardiomyopathy, unspecified: Secondary | ICD-10-CM | POA: Insufficient documentation

## 2015-07-13 DIAGNOSIS — I509 Heart failure, unspecified: Secondary | ICD-10-CM | POA: Diagnosis not present

## 2015-07-13 DIAGNOSIS — Z8679 Personal history of other diseases of the circulatory system: Secondary | ICD-10-CM | POA: Insufficient documentation

## 2015-07-13 DIAGNOSIS — R0602 Shortness of breath: Secondary | ICD-10-CM | POA: Diagnosis not present

## 2015-07-13 MED ORDER — PREDNISONE 20 MG PO TABS: ORAL_TABLET | ORAL | Status: DC

## 2015-07-13 NOTE — Telephone Encounter (Signed)
Spoke with the pt and Lorriane Shire in echo and per [place] she can work the pt in for her echo needed today per Dr Meda Coffee.  Pt states she can be here at 2 pm for her echo.  Lorriane Shire aware of this.

## 2015-07-13 NOTE — Telephone Encounter (Signed)
Follow Up  Pt called states that she was advised to come in today for an ECHO. Order is in [place], Not sure if you wanted it schedule for a same day ECHO. Please assist

## 2015-07-13 NOTE — Telephone Encounter (Signed)
Pt had an echo today and Dr Meda Coffee reviewed this.  Verbal order given per Dr Meda Coffee for the pt to start taking azithromycin (z-pak) exactly as instructed on the prescription, and start taking prednisone 40 mg po for 2 days, then take 20 mg po for 3 days.   Sent the prednisone to the pts confirmed pharmacy of choice.  Hand written prescription for z-pak written by Dr Meda Coffee and given to the pt to take to her pharmacy to fill.  Treatment is for pt having acute bronchitis.  Pt agreed with plan mentioned above.

## 2015-07-18 ENCOUNTER — Encounter: Payer: Self-pay | Admitting: Physician Assistant

## 2015-07-18 NOTE — Progress Notes (Signed)
Cardiology Office Note Date:  07/19/2015  Patient ID:  Michelle Kane, Michelle Kane 09-17-1985, MRN [id number] PCP:  Arnette Norris, MD  Cardiologist:  Dr. Meda Coffee   Chief Complaint: f/u SOB  History of Present Illness: Michelle Kane is a 30 y.o. female with history of cardiomyopathy (dx during pregnancy, also felt possibly familial), GERD, allergy-induced asthma, sinus tachycardia who presents for f/u.   She was first evaluated by cardiology in 12/2014 in her 17th week of pregnancy complaining of palpitations, CP, SOB. Echo 01/12/15 showed mildly dilated LV, EF 45-50%, trivial MR. TSH WNL. Holter 12/2014: persistent sinus tach; was started on Toprol. She developed hypertension and was later switched to labetolol. F/u echo 02/2015 showed EF 55%. Repeat Holter 03/2015 showed inappropriate sinus tach for 14 hours each day and labetalol was increased. She was admitted 05/2015 to [facility] with acute CHF, orthopnea, LEE, weight gain. She was treated with diuretics that gave her some relief but she was transferred to [facility] given high risk on 05/15/2015 and induced to labor. She eventually delivered a healthy baby boy Michelle Kane at 34th week of pregnancy. At f/u appt 05/22/15 she was started on oral Lasix daily. LE venous duplex 05/23/15 was negative for DVT. At f/u appointment 06/06/15, she was reporting 12lb weight gain in 3 weeks. Repeat echo 06/06/15 showed mildly dilated LV, EF 50-55% although Dr. Francesca Oman office note from that day indicates EF was 40-45%. She was given IV Lasix in the office and Lasix was increased to 40mg  BID. Labs were unremarkable except mild anemia Hgb 10.1 and BNP surprisingly normal. At one point it appears labetalol was changed to carvedilol and lisinopril was added. She was seen in the ER twice the week of Christmas with ongoing DOE without orthopnea, PND or LE edema. She was also reporting weight gain and night sweats. She had been prescribed inhalers but did not pick them up due to cost. Her  symptoms were not felt typical for CHF as her BNP was only 8 and CXR 07/09/15 was normal, along with negative d-dimer. Dr. Meda Coffee saw her in f/u 07/12/15 and suspected her problem was now not cardiac but instead pulm/ENT. TSH WNL, Hgb 11.2 (stable), WBc 8.6 (down from 11.4 in the ER), BMET unremarkable. Dr. Meda Coffee prescribed albuterol, azithromycin, and steroid taper, and referred the patient to pulmonology. F/u echo 07/13/15: EF 50%, no RWMA, normal diastolic parameters. Of note, she had had two prior pregnancies (ages 29, 42) with similar symptoms and no complications. She has a FH of premature CAD, grandfather had MI, CABG in his 86s, GGF died of CHF. Her bother and uncle also have dilated cardiomyopathy.   The patient comes in today for f/u. She noticed mild improvement with the above pulmonary regimen but continues to experience DOE and night sweats. No chest pain or syncope. She remains on Lasix twice a day which she states has done better than when she was previously taking one per day. Weight remains similar to last visit.   Past Medical History  Diagnosis Date  . GERD (gastroesophageal reflux disease)   . Allergy-induced asthma   . 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 History of ovarian cyst   . Pelvic pain in female   . History of colon polyps   . Sinus tachycardia ([place])     a. 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 b. 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  . Peripartum cardiomyopathy     a. EF  40-45% in 2016, varying by several echoes. F/u echo 06/2015 showed EF 50%.   . Chronic systolic CHF (congestive heart failure) ([place])   . Essential hypertension     Past Surgical History  Procedure Laterality Date  . Cesarean section  03-01-2008  . Im pinning right elbow fx  [place]  . Laparoscopy N/A 07/29/2014    Procedure: LAPAROSCOPY DIAGNOSTIC;  Surgeon: Cyril Mourning, MD;  Location: [facility];   Service: Gynecology;  Laterality: N/A;    Current Outpatient Prescriptions  Medication Sig Dispense Refill  . carvedilol (COREG) 3.125 MG tablet Take 1 tablet (3.125 mg total) by mouth 2 (two) times daily. 180 tablet 3  . famotidine-calcium carbonate-magnesium hydroxide (PEPCID COMPLETE) 10-800-165 MG CHEW chewable tablet Chew 2 tablets by mouth daily as needed (heartburn).    . Fluticasone-Salmeterol (ADVAIR DISKUS) 250-50 MCG/DOSE AEPB Inhale 1 puff into the lungs 2 (two) times daily. 60 each 3  . furosemide (LASIX) 40 MG tablet Take 1 tablet (40 mg total) by mouth 2 (two) times daily. 90 tablet 3  . lisinopril (PRINIVIL,ZESTRIL) 2.5 MG tablet Take 1 tablet (2.5 mg total) by mouth daily. 90 tablet 3  . PROAIR HFA 108 (90 Base) MCG/ACT inhaler Inhale 1-2 puffs into the lungs every 4 (four) hours as needed for wheezing or shortness of breath. 1 Inhaler 1   No current facility-administered medications for this visit.    Allergies:   Vicodin   Social History:  The patient  reports that she has quit smoking. Her smoking use included Cigarettes. She has a 5 pack-year smoking history. She has never used smokeless tobacco. She reports that she does not drink alcohol or use illicit drugs.   Family History:  The patient's family history includes Colon polyps in her mother; Crohn's disease in her mother; Diabetes in her paternal grandfather; Heart attack in her maternal grandfather; Heart failure in her brother, paternal aunt, and paternal uncle; Hemophilia in her father and son; Hyperlipidemia in her father and mother; Stroke in her paternal grandfather. There is no history of Hypertension.   ROS:  Please see the history of present illness.  All other systems are reviewed and otherwise negative.   PHYSICAL EXAM:  VS:  BP 100/60 mmHg  Pulse 104  Ht 5' 6.75" (1.695 m)  Wt 259 lb (117.482 kg)  BMI 40.89 kg/m2  LMP 09/18/2014 BMI: Body mass index is 40.89 kg/(m^2). Well nourished, well developed  obese WF in no acute distress HEENT: normocephalic, atraumatic Neck: no JVD, carotid bruits or masses Cardiac:  normal S1, S2; RRR with borderline elevated rate; no murmurs, rubs, or gallops Lungs:  clear to auscultation bilaterally, no wheezing, rhonchi or rales Abd: soft, nontender, no hepatomegaly, + BS MS: no deformity or atrophy Ext: no edema Skin: warm and dry, no rash Neuro:  moves all extremities spontaneously, no focal abnormalities noted, follows commands Psych: euthymic mood, full affect  EKG:  Done today shows sinus tach 102bpm otherwise normal EKG  Recent Labs: 07/09/2015: ALT 15; B Natriuretic Peptide 7.9 07/12/2015: BUN 9; Creat 0.60; Hemoglobin 11.2*; Platelets 282; Potassium 3.9; Sodium 141; TSH 1.493  No results found for requested labs within last 365 days.   Estimated Creatinine Clearance: 136.9 mL/min (by C-G formula based on Cr of 0.6).   Wt Readings from Last 3 Encounters:  07/19/15 259 lb (117.482 kg)  07/12/15 258 lb 6.4 oz (117.209 kg)  07/09/15 250 lb (113.399 kg)  Other studies reviewed: Additional studies/records reviewed today include: summarized above  ASSESSMENT AND PLAN:  1. Dyspnea on exertion and night sweats - symptoms are not clearly cardiac at this point. 2D Echo showed recent EF 50%, no WMA, normal diastolic function. BNP was only 8, arguing against recurrent CHF. Hgb was stable along with negative d-dimer. She is pending referral to pulmonology tomorrow as Dr. Meda Coffee has suspected her residual dyspnea may be lung-related instead. Question whether she needs some sort of imaging of her chest given routine night sweats - will defer to pulmonology. I also plan to update Dr. Meda Coffee. 2. Cardiomyopathy - occurring during pregnancy; also felt to be familial per Dr. Meda Coffee. Patient remains on BB, ACEI, Lasix. Lisinopril is category X for future pregnancies but she reports a tubal ligation. 3. Essential HTN - controlled. 4. Sinus tachycardia -  appears chronic. Even looking back into 2013-2014 she has had periodic times where HR ranged in the high 90s-low 100s. Recent TSH and d-dimer were normal with stable Hgb. She would benefit from improved HR control to see if this helps with her dyspnea. Pending review of pulmonology's note, we would consider switching her to more selective beta blocker in light of history of asthma.  Disposition: F/u with Dr. Meda Coffee in 2-3 months.  Current medicines are reviewed at length with the patient today.  The patient did not have any concerns regarding medicines.  Raechel Ache PA-C 07/19/2015 9:22 AM     [facility] [address] [place] [address] [phone] (office)  [phone] (fax)

## 2015-07-19 ENCOUNTER — Encounter: Payer: Self-pay | Admitting: Physician Assistant

## 2015-07-19 ENCOUNTER — Ambulatory Visit (INDEPENDENT_AMBULATORY_CARE_PROVIDER_SITE_OTHER): Payer: Medicaid Other | Admitting: Physician Assistant

## 2015-07-19 VITALS — BP 100/60 | HR 104 | Ht 66.75 in | Wt 259.0 lb

## 2015-07-19 DIAGNOSIS — I10 Essential (primary) hypertension: Secondary | ICD-10-CM | POA: Diagnosis not present

## 2015-07-19 DIAGNOSIS — R0600 Dyspnea, unspecified: Secondary | ICD-10-CM

## 2015-07-19 DIAGNOSIS — R000 Tachycardia, unspecified: Secondary | ICD-10-CM | POA: Diagnosis not present

## 2015-07-19 DIAGNOSIS — I429 Cardiomyopathy, unspecified: Secondary | ICD-10-CM | POA: Diagnosis not present

## 2015-07-19 NOTE — Patient Instructions (Addendum)
Medication Instructions:  No changes today  Labwork: None today  Testing/Procedures: None today  Follow-Up: Your physician recommends that you schedule a follow-up appointment in: 2-3 months with Dr Meda Coffee.        If you need a refill on your cardiac medications before your next appointment, please call your pharmacy. +

## 2015-07-20 ENCOUNTER — Encounter: Payer: Self-pay | Admitting: Internal Medicine

## 2015-07-20 ENCOUNTER — Ambulatory Visit (INDEPENDENT_AMBULATORY_CARE_PROVIDER_SITE_OTHER): Payer: Medicaid Other | Admitting: Internal Medicine

## 2015-07-20 VITALS — BP 106/70 | HR 110 | Ht 66.75 in | Wt 258.0 lb

## 2015-07-20 DIAGNOSIS — R0600 Dyspnea, unspecified: Secondary | ICD-10-CM

## 2015-07-20 DIAGNOSIS — I429 Cardiomyopathy, unspecified: Secondary | ICD-10-CM

## 2015-07-20 MED ORDER — PANTOPRAZOLE SODIUM 40 MG PO TBEC: 40.0000 mg | DELAYED_RELEASE_TABLET | Freq: Every day | ORAL | Status: DC

## 2015-07-20 MED ORDER — LOSARTAN POTASSIUM 25 MG PO TABS: 25.0000 mg | ORAL_TABLET | Freq: Every day | ORAL | Status: DC

## 2015-07-20 MED ORDER — FAMOTIDINE 20 MG PO TABS: ORAL_TABLET | ORAL | Status: DC

## 2015-07-20 NOTE — Assessment & Plan Note (Signed)
Body mass index is 40.73 kg/(m^2).  Lab Results  Component Value Date   TSH 1.493 07/12/2015     Contributing to gerd tendency/ doe/reviewed the need and the process to achieve and maintain neg calorie balance > defer f/u primary care including intermittently monitoring thyroid status

## 2015-07-20 NOTE — Patient Instructions (Signed)
Pantoprazole (protonix) 40 mg   Take  30-60 min before first meal of the day and Pepcid (famotidine)  20 mg one @  bedtime until return to office - this is the best way to tell whether stomach acid is contributing to your problem.    Stop lisinopril and replace with losartan 25 mg daily   Only use your albuterol as a rescue medication to be used if you can't catch your breath by resting or doing a relaxed purse lip breathing pattern.  - The less you use it, the better it will work when you need it. - Ok to use up to 2 puffs  every 4 hours if you must but call for immediate appointment if use goes up over your usual need - Don't leave home without it !!  (think of it like the spare tire for your car)   GERD (REFLUX)  is an extremely common cause of respiratory symptoms just like yours , many times with no obvious heartburn at all.    It can be treated with medication, but also with lifestyle changes including elevation of the head of your bed (ideally with 6 inch  bed blocks),  Smoking cessation, avoidance of late meals, excessive alcohol, and avoid fatty foods, chocolate, peppermint, colas, red wine, and acidic juices such as orange juice.  NO MINT OR MENTHOL PRODUCTS SO NO COUGH DROPS  USE SUGARLESS CANDY INSTEAD (Jolley ranchers or Stover's or Life Savers) or even ice chips will also do - the key is to swallow to prevent all throat clearing. NO OIL BASED VITAMINS - use powdered substitutes.    Please schedule a follow up office visit in 4 weeks, sooner if needed

## 2015-07-20 NOTE — Progress Notes (Signed)
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 @  onset of pregnancy reported sporadic use of  saba since exp cats in 2003 with tendency to flare sob/wheeze sev times a year , but esp in cold weather and also with spring typically requiring short course prednisone then 3rd IUP   complicated by chf @  third trimester and rx labetolol and did not respond as well to albuterol since then but p delivery did better and didn't need inhaler but at 3 weeks post partum cough /sob while on  labetolol > re -eval by cards:   06/06/15 Dr Marylouise Stacks eval  STOP TAKING LABETALOL NOW START TAKING LISINOPRIL 2.5 MG ONCE DAILY START TAKING CARVEDILOL 3.125 MG BY MOUTH TWICE DAILY INCREASE YOUR LASIX TO 40 MG TWICE DAILY---TAKE ONE TABLET AT 8 AM AND 1 TABLET AT 2 PM START TAKING ADVAIR INHALER 250/50 1 PUFF TWICE DAILY   Did better until week of xmas then breathing got worse > lasix helped transiently and returned to cards   07/12/15 cards note  DOE - not typical for CHF, in the past she had LE edema, lung crackles, orthopnea, right now she has stridor, wheezes, no crackles, no LE edema,  CXR on 12/25 was normal. BNP 8 -  I am suspicious that her problem is not cardiac but pulmonary or ENT, I will prescribe albuterol, refer to pulmonary.    07/20/2015 1st [facility] office visit/ Michelle Kane  / consultation re undx sob  Chief Complaint  Patient presents with  . Pulmonary Consult    Referred by Dr. Meda Coffee. Pt states that she was dxed with allergy induced asthma in 2003. Pt c/o SOB x 2 wks.  She gets SOB when she lies down and also with miminmal exertion such as walking from lobby to exam room today. She has been using rescue inhaler 4 x daily on average.   stopped advair 2 weeks prior to OV  Due to cost and no worse but also no better on saba. Ok at rest but can't lie flat s immediate sense of sob/ can't walk 50 ft s  sob / assoc with overt hb/ mild hoarseness but not real coughing    No obvious  patterns in day to day or daytime variabilty or assoc  excess/ purulent sputum or mucus plugs  or cp or chest tightness, subjective wheeze overt sinus  symptoms. No unusual exp hx or h/o childhood pna/ asthma or knowledge of premature birth.  Also denies any obvious fluctuation of symptoms with weather or environmental changes or other aggravating or alleviating factors except as outlined above   Current Medications, Allergies, Complete Past Medical History, Past Surgical History, Family History, and Social History were reviewed in [employer] record.               Review of Systems  Constitutional: Negative for fever, chills and unexpected weight change.  HENT: Negative for congestion, dental problem, ear pain, nosebleeds, postnasal drip, rhinorrhea, sinus pressure, sneezing, sore throat, trouble swallowing and voice change.   Eyes: Negative for visual disturbance.  Respiratory: Positive for shortness of breath. Negative for cough and choking.   Cardiovascular: Negative for chest pain and leg swelling.  Gastrointestinal: Negative for vomiting, abdominal pain and diarrhea.  Genitourinary: Negative for difficulty urinating.       Acid heartburn  Musculoskeletal: Negative for  arthralgias.  Skin: Negative for rash.  Neurological: Negative for tremors, syncope and headaches.  Hematological: Does not bruise/bleed easily.       Objective:   Physical Exam  amb obese wf nad  Wt Readings from Last 3 Encounters:  07/20/15 258 lb (117.028 kg)  07/19/15 259 lb (117.482 kg)  07/12/15 258 lb 6.4 oz (117.209 kg)    Vital signs reviewed   HEENT: nl dentition, turbinates, and oropharynx. Nl external ear canals without cough reflex   NECK :  without JVD/Nodes/TM/ nl carotid upstrokes bilaterally   LUNGS: no acc muscle use,  Nl contour chest which is clear to A and P bilaterally  without cough on insp or exp maneuvers   CV:  RRR  no s3 or murmur or increase in P2, no edema   ABD:  soft and nontender with nl inspiratory excursion in the supine position. No bruits or organomegaly, bowel sounds nl  MS:  Nl gait/ ext warm without deformities, calf tenderness, cyanosis or clubbing No obvious joint restrictions   SKIN: warm and dry without lesions    NEURO:  alert, approp, nl sensorium with  no motor deficits     I personally reviewed images and agree with radiology impression as follows:  CXR:  07/09/15  No active cardiopulmonary disease.    Labs ordered/ reviewed:      Chemistry      Component Value Date/Time   NA 141 07/12/2015 1703   K 3.9 07/12/2015 1703   CL 105 07/12/2015 1703   CO2 25 07/12/2015 1703   BUN 9 07/12/2015 1703   CREATININE 0.60 07/12/2015 1703   CREATININE 0.98 07/09/2015 2220      Component Value Date/Time   CALCIUM 8.5* 07/12/2015 1703   ALKPHOS 68 07/09/2015 2220   AST 16 07/09/2015 2220   ALT 15 07/09/2015 2220   BILITOT 0.5 07/09/2015 2220        Lab Results  Component Value Date   WBC 8.6 07/12/2015   HGB 11.2* 07/12/2015   HCT 34.2* 07/12/2015   MCV 83.4 07/12/2015   PLT 282 07/12/2015     Lab Results  Component Value Date   DDIMER 0.43 07/09/2015      Lab Results  Component Value Date   TSH 1.493 07/12/2015    BNP 07/09/15 =  8             Assessment & Plan:

## 2015-07-21 ENCOUNTER — Telehealth: Payer: Self-pay | Admitting: *Deleted

## 2015-07-21 MED ORDER — METOPROLOL TARTRATE 25 MG PO TABS: 25.0000 mg | ORAL_TABLET | Freq: Every day | ORAL | Status: DC

## 2015-07-21 MED ORDER — NEBIVOLOL HCL 5 MG PO TABS: 5.0000 mg | ORAL_TABLET | Freq: Every day | ORAL | Status: DC

## 2015-07-21 NOTE — Telephone Encounter (Signed)
Contacted the pt back to inform her that Dr Meda Coffee and Elberta Leatherwood our PharmD want to change the plan mentioned below, for bystolic will unfortunately not be covered by her insurance, for she has known Heart Failure.  Informed the pt that per Gay Filler, pts with HF never get approved for this drug, and it would be too costly.  Informed the pt, however, there is a safe medication in the beta blocker family, that she can take without exacerbating her asthma.  Informed the pt that its also very inexpensive.  Informed the pt that both Dr Meda Coffee and Gay Filler recommend that she try metoprolol tartrate 25 mg po daily and still d/c her carvedilol.  Confirmed the pharmacy of choice with the pt.  Advised her to start this new regimen tomorrow, if she has already taken her carvedilol.  Pt verbalized understanding and agrees with this plan.

## 2015-07-21 NOTE — Telephone Encounter (Signed)
Called the pt as Dr Meda Coffee requested, to inform her that Dr Meda Coffee spoke with her Pulmonologist about her asthma and taking carvedilol, and they both concurred that we should d/c her carvedilol and start her on Bystolic 5 mg po daily.  Informed the pt that the rationale for this is because they both think that carvedilol is exacerbating her asthma.  Informed the pt that Dr Meda Coffee wants her to try the samples of Bystolic 1st, before sending the script into her pharmacy.  Informed the pt that I will leave the samples up front for her to pick up, and she should try this out and get back with our office in a couple of weeks to report her current samples.  Advised the pt that she should not take carvedilol and Bystolic together, for if she has already taken her carvedilol today, then she should stop it tomorrow and then take Bystolic then.  Pt verbalized understanding and agrees with this plan.

## 2015-07-21 NOTE — Assessment & Plan Note (Addendum)
Spirometry 07/20/2015   FEV1  2.70(78%) ratio 70 and FEF 25-75 47  - 07/20/2015  Walked RA x 3 laps @ 185 ft each stopped due to  End of study, nl pace, no  sob or desat   - trial off acei 07/20/2015   Symptoms are markedly disproportionate to objective findings and not clear this is a lung problem but pt does appear to have difficult airway management issues. DDX of  difficult airways management all start with A and  include Adherence, Ace Inhibitors, Acid Reflux, Active Sinus Disease, Alpha 1 Antitripsin deficiency, Anxiety masquerading as Airways dz,  ABPA,  allergy(esp in young), Aspiration (esp in elderly), Adverse effects of meds,  Active smokers, A bunch of PE's (a small clot burden can't cause this syndrome unless there is already severe underlying pulm or vascular dz with poor reserve) plus two Bs  = Bronchiectasis and Beta blocker use..and one C= CHF  Adherence is always the initial "prime suspect" and is a multilayered concern that requires a "trust but verify" approach in every patient - starting with knowing how to use medications, especially inhalers, correctly, keeping up with refills and understanding the fundamental difference between maintenance and prns vs those medications only taken for a very short course and then stopped and not refilled.   ACEi adverse effects at the  top of the usual list of suspects and the only way to rule it out is a trial off > see hbp a/p    ? Acid (or non-acid) GERD > always difficult to exclude as up to 75% of pts in some series report no assoc GI/ Heartburn symptoms> rec max (24h)  acid suppression and diet restrictions/ reviewed and instructions given in writing  ? Allergy/ asthma > not assoc with typical uri or sinus complaints. Her spirometry is minimally abnormal with a reduction in mid flows consistent with low-grade occult asthma but that would not explain her present symptoms of.sob After 50 feet of walking or the immediate dyspnea she expresses when she  lies down at night which is not at all typical of nocturnal asthma. It is certainly okay however for her to continue to use her albuterol on an as-needed basis.  ? A bunch of PE's > D dimer nl - while  A nl valute  may miss small peripheral pe, the clot burden with sob is moderately high and the d dimer has a very high neg pred value in this setting    ? Beta blockers > note this present flare of symptoms she started while on beta blockers albeit associated with some evidence of heart failure but may well have precipitated some asthma and I would certainly avoid beta blockers in the future unless they are the most specific available that is bisoprolol or Bystolic.  ? chf > not c/w bnp 8   I had an extended discussion with the patient reviewing all relevant studies completed to date and  lasting 35  minutes of a 60 minute visit    Each maintenance medication was reviewed in detail including most importantly the difference between maintenance and prns and under what circumstances the prns are to be triggered using an action plan format that is not reflected in the computer generated alphabetically organized AVS.    Please see instructions for details which were reviewed in writing and the patient given a copy highlighting the part that I personally wrote and discussed at today's ov.

## 2015-07-21 NOTE — Addendum Note (Signed)
Addended by: Nuala Alpha on: 07/21/2015 12:26 PM   Modules accepted: Orders, Medications

## 2015-07-21 NOTE — Assessment & Plan Note (Addendum)
In the best review of chronic cough to date ( NEJM 2016 375 [phone]) ,  ACEi are now felt to cause cough in up to  20% of pts which is a 4 fold increase from previous reports and does not include the variety of non-specific complaints we see in pulmonary clinic in pts on ACEi but previously attributed to copd/asthma to include PNDS, throat and chest congestion, "bronchitis", unexplained dyspnea and noct "strangling" sensations as well as atypical /refractory GERD symptoms like atypical dysphagia.   The only way to prove this is not an "ACEi Case" is a trial off ACEi x a minimum of 4 weeks then regroup at that point   Since only on very low dose lisinopril try cozar 25 mg daily

## 2015-08-14 ENCOUNTER — Telehealth: Payer: Self-pay | Admitting: Cardiology

## 2015-08-14 NOTE — Telephone Encounter (Signed)
New message    Pt c/o medication issue:  1. Name of Medication:  Losartan  25 mg   2. How are you currently taking this medication (dosage and times per day)? Once a day   3. Are you having a reaction (difficulty breathing--STAT)? No   4. What is your medication issue? Will be starting breast feeding wants to discuss switch medication.

## 2015-08-15 NOTE — Telephone Encounter (Signed)
Notified the pt that per Dr Meda Coffee, she should stop losartan temporarily and follow-up with Dr Marlene Lard this week and Dr Meda Coffee in 10/09/15. Informed the pt that I will update this hold in her med list, although it will still appear in her medications, but will have "held" written in the comments section.  Pt verbalized understanding, agrees with this plan, and gracious for all the assistance provided.

## 2015-08-15 NOTE — Telephone Encounter (Signed)
Pt is calling to let Dr Meda Coffee know that her infant baby is currently admitted to [facility] for RSV, and is currently on a ventilator.  Pt states that the Critical Care Peds MD advised her to call Dr Meda Coffee and Dr Marlene Lard, and ask them both if she absolutely needs to remain on Losartan 25 mg, for they want her to start back breast feeding her baby, to boost his immune system up, once he is weaned off the vent.   Pt states that the MDs looked at all her cardiac meds to make sure there were no contraindications between her taking them and starting back breastfeeding her baby.  Pt states that the only medication the Peds MD questioned about her taking while breastfeeding was Losartan 25 mg.  Pt states she would like for Dr Meda Coffee and Dr Marlene Lard (being he started the pt on Losartan), to advise on whether its safe to take all her cardiac meds prescribed, especially Losartan, while starting back breastfeeding, here in the near future.  Informed the pt that Dr Meda Coffee is currently out of the office today, but I will most certainly route this message to her for further review and recommendation and follow-up with the pt thereafter.  Pt verbalized understanding and agrees with this plan.  Pt will not start breast feeding, until her infant is off the vent and its advised that its safe to breastfeed.

## 2015-08-15 NOTE — Telephone Encounter (Signed)
Please stop losartan temporarily, we will follow

## 2015-08-17 ENCOUNTER — Ambulatory Visit: Payer: Medicaid Other | Admitting: Internal Medicine

## 2015-09-08 ENCOUNTER — Encounter: Payer: Self-pay | Admitting: *Deleted

## 2015-09-24 ENCOUNTER — Emergency Department (HOSPITAL_BASED_OUTPATIENT_CLINIC_OR_DEPARTMENT_OTHER): Payer: Medicaid Other

## 2015-09-24 ENCOUNTER — Encounter (HOSPITAL_BASED_OUTPATIENT_CLINIC_OR_DEPARTMENT_OTHER): Payer: Self-pay | Admitting: Emergency Medicine

## 2015-09-24 ENCOUNTER — Emergency Department (HOSPITAL_BASED_OUTPATIENT_CLINIC_OR_DEPARTMENT_OTHER)
Admission: EM | Admit: 2015-09-24 | Discharge: 2015-09-25 | Disposition: A | Discharge status: Home / Self Care | Payer: Medicaid Other | Attending: Emergency Medicine | Admitting: Emergency Medicine

## 2015-09-24 DIAGNOSIS — R197 Diarrhea, unspecified: Secondary | ICD-10-CM | POA: Insufficient documentation

## 2015-09-24 DIAGNOSIS — Z8742 Personal history of other diseases of the female genital tract: Secondary | ICD-10-CM | POA: Insufficient documentation

## 2015-09-24 DIAGNOSIS — K219 Gastro-esophageal reflux disease without esophagitis: Secondary | ICD-10-CM | POA: Insufficient documentation

## 2015-09-24 DIAGNOSIS — Z79899 Other long term (current) drug therapy: Secondary | ICD-10-CM | POA: Insufficient documentation

## 2015-09-24 DIAGNOSIS — Z86010 Personal history of colonic polyps: Secondary | ICD-10-CM | POA: Insufficient documentation

## 2015-09-24 DIAGNOSIS — I10 Essential (primary) hypertension: Secondary | ICD-10-CM | POA: Insufficient documentation

## 2015-09-24 DIAGNOSIS — R0602 Shortness of breath: Secondary | ICD-10-CM | POA: Diagnosis present

## 2015-09-24 DIAGNOSIS — J45901 Unspecified asthma with (acute) exacerbation: Secondary | ICD-10-CM | POA: Insufficient documentation

## 2015-09-24 DIAGNOSIS — I5022 Chronic systolic (congestive) heart failure: Secondary | ICD-10-CM | POA: Insufficient documentation

## 2015-09-24 DIAGNOSIS — R110 Nausea: Secondary | ICD-10-CM | POA: Insufficient documentation

## 2015-09-24 DIAGNOSIS — Z87891 Personal history of nicotine dependence: Secondary | ICD-10-CM | POA: Insufficient documentation

## 2015-09-24 MED ORDER — IPRATROPIUM BROMIDE 0.02 % IN SOLN
0.5000 mg | RESPIRATORY_TRACT | Status: AC
  Administered 2015-09-24: 0.5 mg via RESPIRATORY_TRACT

## 2015-09-24 MED ORDER — ALBUTEROL (5 MG/ML) CONTINUOUS INHALATION SOLN
10.0000 mg/h | INHALATION_SOLUTION | RESPIRATORY_TRACT | Status: DC
  Administered 2015-09-24: 10 mg/h via RESPIRATORY_TRACT
  Filled 2015-09-24: qty 20

## 2015-09-24 MED ORDER — ALBUTEROL SULFATE (2.5 MG/3ML) 0.083% IN NEBU
INHALATION_SOLUTION | RESPIRATORY_TRACT | Status: AC
  Administered 2015-09-24: 5 mg via RESPIRATORY_TRACT
  Filled 2015-09-24: qty 6

## 2015-09-24 MED ORDER — IPRATROPIUM BROMIDE 0.02 % IN SOLN
RESPIRATORY_TRACT | Status: AC
  Administered 2015-09-24: 0.5 mg via RESPIRATORY_TRACT
  Filled 2015-09-24: qty 2.5

## 2015-09-24 MED ORDER — ALBUTEROL SULFATE (2.5 MG/3ML) 0.083% IN NEBU
5.0000 mg | INHALATION_SOLUTION | RESPIRATORY_TRACT | Status: AC
  Administered 2015-09-24: 5 mg via RESPIRATORY_TRACT

## 2015-09-24 NOTE — ED Notes (Signed)
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 The patient comes in tonight "because I got tired of not being able to breath". Patient talking in full sententances. Audible wheezing hear, patient does not appear to be in any distress

## 2015-09-24 NOTE — ED Provider Notes (Signed)
CSN: [id number]     Arrival date & time 09/24/15  2043 History  By signing my name below, I, Helane Gunther, attest that this documentation has been prepared under the direction and in the presence of Shanon Rosser, MD. Electronically Signed: Helane Gunther, ED Scribe. 09/24/2015. 11:32 PM.      Chief Complaint  Patient presents with  . Shortness of Breath   The history is provided by the patient. No language interpreter was used.   HPI Comments: Michelle Kane is a 30 y.o. female who presents to the Emergency Department complaining of persistent, worsening SOB onset 1 month ago. Pt states she had a sinus infection when the SOB began. She states the sinus issue has since resolved. However, the SOB has continued to worsen. She reports associated productive cough, wheezing and rattling in her chest, feeling flushed, nausea for the past week, and diarrhea over the past 2 days. Breathing symptoms are moderate and worse with exertion. She has used her inhaler at home without relief and is now out. She notes that this feels more like asthma than CHF to her.   Pt was given albuterol and Atrovent nebulizer treatment with partial improvement.  Past Medical History  Diagnosis Date  . GERD (gastroesophageal reflux disease)   . Allergy-induced asthma   . 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 History of ovarian cyst   . Pelvic pain in female   . History of colon polyps   . 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 b. 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  . Peripartum cardiomyopathy     a. EF 40-45% in 2016, varying by several echoes. F/u echo 06/2015 showed EF 50%.   . Chronic systolic CHF (congestive heart failure) ([place])   . Essential hypertension    Past Surgical History  Procedure Laterality Date  . Cesarean section  03-01-2008  . Im pinning right elbow fx  [place]  . Laparoscopy N/A 07/29/2014     Procedure: LAPAROSCOPY DIAGNOSTIC;  Surgeon: Cyril Mourning, MD;  Location[facility];  Service: Gynecology;  Laterality: N/A;   Family History  Problem Relation Age of Onset  . Asthma Mother   . Crohn's disease Mother   . Colon polyps Mother   . Hyperlipidemia Father   . Hemophilia Father   . Hemophilia Son   . Heart attack Maternal Grandfather   . Diabetes Paternal Grandfather   . Heart failure Brother   . Heart failure Paternal Uncle   . Heart failure Paternal Aunt   . Hypertension Neg Hx   . Stroke Paternal Grandfather   . COPD Maternal Grandmother     smoked  . Lung cancer Maternal Grandmother     smoked  . COPD Father     smoked   Social History  Substance Use Topics  . Smoking status: Former Smoker -- 0.50 packs/day for 10 years    Types: Cigarettes    Quit date: 02/13/2015  . Smokeless tobacco: Never Used  . Alcohol Use: No     Comment: social   OB History    Gravida Para Term Preterm AB TAB SAB Ectopic Multiple Living   4 2 2  1  1   2      Review of Systems  All other systems reviewed and are negative.   Allergies  Vicodin  Home Medications   Prior to  Admission medications   Medication Sig Start Date End Date Taking? Authorizing Provider  famotidine (PEPCID) 20 MG tablet One at bedtime 07/20/15   Tanda Rockers, MD  famotidine-calcium carbonate-magnesium hydroxide (PEPCID COMPLETE) [phone] MG CHEW chewable tablet Chew 2 tablets by mouth daily as needed (heartburn).    Historical Provider, MD  furosemide (LASIX) 40 MG tablet Take 1 tablet (40 mg total) by mouth 2 (two) times daily. 06/06/15   [name], MD  losartan (COZAAR) 25 MG tablet Take 1 tablet (25 mg total) by mouth daily. 07/20/15   Tanda Rockers, MD  metoprolol tartrate (LOPRESSOR) 25 MG tablet Take 1 tablet (25 mg total) by mouth daily. 07/21/15   [name], MD  pantoprazole (PROTONIX) 40 MG tablet Take 1 tablet (40 mg total) by mouth daily. Take 30-60 min  before first meal of the day 07/20/15   Tanda Rockers, MD  [facility] HFA 108 [phone] Base) MCG/ACT inhaler Inhale 1-2 puffs into the lungs every 4 (four) hours as needed for wheezing or shortness of breath. 07/12/15   [name], MD   BP 127/88 mmHg  Pulse 97  Temp(Src) 98.4 F (36.9 C) (Oral)  Resp 26  Ht 5\' 7"  (1.702 m)  Wt 265 lb (120.203 kg)  BMI 41.50 kg/m2  SpO2 97%  LMP 08/09/2015   Physical Exam General: Well-developed, well-nourished female in no acute distress; appearance consistent with age of record HENT: normocephalic; atraumatic Eyes: pupils equal, round and reactive to light; extraocular muscles intact Neck: supple Heart: regular rate and rhythm Lungs: inspiratory and expiratory wheezes Abdomen: soft; nondistended; nontender; no masses or hepatosplenomegaly; bowel sounds present Extremities: No deformity; full range of motion; pulses normal Neurologic: Awake, alert and oriented; motor function intact in all extremities and symmetric; no facial droop Skin: Warm and dry Psychiatric: Normal mood and affect   ED Course  Procedures   MDM  Nursing notes and vitals signs, including pulse oximetry, reviewed.  Summary of this visit's results, reviewed by myself:  Imaging Studies: Dg Chest 2 View  09/25/2015  CLINICAL DATA:  Chronic worsening shortness of breath. Productive cough, wheezing and chest rattling. Diarrhea. Initial encounter. EXAM: CHEST  2 VIEW COMPARISON:  Chest radiograph performed 07/09/2015 FINDINGS: The lungs are well-aerated and clear. There is no evidence of focal opacification, pleural effusion or pneumothorax. The heart is normal in size; the mediastinal contour is within normal limits. No acute osseous abnormalities are seen. IMPRESSION: No acute cardiopulmonary process seen. Electronically Signed   By: Garald Balding M.D.   On: 09/25/2015 00:53   2:08 AM Improved air movement with minimal wheezing now after 10 milligram continuous albuterol  treatment. Dexamethasone given as well. Patient is requesting a refill of her inhaler and albuterol for home neb treatments.  I personally performed the services described in this documentation, which was scribed in my presence. The recorded information has been reviewed and is accurate.   Shanon Rosser, MD 09/25/15 [phone]

## 2015-09-25 MED ORDER — ALBUTEROL SULFATE (2.5 MG/3ML) 0.083% IN NEBU: 2.5000 mg | INHALATION_SOLUTION | RESPIRATORY_TRACT | Status: DC | PRN

## 2015-09-25 MED ORDER — DEXAMETHASONE 4 MG PO TABS
10.0000 mg | ORAL_TABLET | Freq: Once | ORAL | Status: AC
  Administered 2015-09-25: 10 mg via ORAL
  Filled 2015-09-25: qty 1

## 2015-09-25 MED ORDER — ALBUTEROL SULFATE HFA 108 (90 BASE) MCG/ACT IN AERS
2.0000 | INHALATION_SPRAY | RESPIRATORY_TRACT | Status: DC | PRN
  Administered 2015-09-25: 2 via RESPIRATORY_TRACT
  Filled 2015-09-25: qty 6.7

## 2015-09-25 NOTE — Discharge Instructions (Signed)
Asthma, Adult Asthma is a recurring condition in which the airways tighten and narrow. Asthma can make it difficult to breathe. It can cause coughing, wheezing, and shortness of breath. Asthma episodes, also called asthma attacks, range from minor to life-threatening. Asthma cannot be cured, but medicines and lifestyle changes can help control it. CAUSES Asthma is believed to be caused by inherited (genetic) and environmental factors, but its exact cause is unknown. Asthma may be triggered by allergens, lung infections, or irritants in the air. Asthma triggers are different for each person. Common triggers include:   Animal dander.  Dust mites.  Cockroaches.  Pollen from trees or grass.  Mold.  Smoke.  Air pollutants such as dust, household cleaners, hair sprays, aerosol sprays, paint fumes, strong chemicals, or strong odors.  Cold air, weather changes, and winds (which increase molds and pollens in the air).  Strong emotional expressions such as crying or laughing hard.  Stress.  Certain medicines (such as aspirin) or types of drugs (such as beta-blockers).  Sulfites in foods and drinks. Foods and drinks that may contain sulfites include dried fruit, potato chips, and sparkling grape juice.  Infections or inflammatory conditions such as the flu, a cold, or an inflammation of the nasal membranes (rhinitis).  Gastroesophageal reflux disease (GERD).  Exercise or strenuous activity. SYMPTOMS Symptoms may occur immediately after asthma is triggered or many hours later. Symptoms include:  Wheezing.  Excessive nighttime or early morning coughing.  Frequent or severe coughing with a common cold.  Chest tightness.  Shortness of breath. DIAGNOSIS  The diagnosis of asthma is made by a review of your medical history and a physical exam. Tests may also be performed. These may include:  Lung function studies. These tests show how much air you breathe in and out.  Allergy  tests.  Imaging tests such as X-rays. TREATMENT  Asthma cannot be cured, but it can usually be controlled. Treatment involves identifying and avoiding your asthma triggers. It also involves medicines. There are 2 classes of medicine used for asthma treatment:   Controller medicines. These prevent asthma symptoms from occurring. They are usually taken every day.  Reliever or rescue medicines. These quickly relieve asthma symptoms. They are used as needed and provide short-term relief. Your health care provider will help you create an asthma action plan. An asthma action plan is a written plan for managing and treating your asthma attacks. It includes a list of your asthma triggers and how they may be avoided. It also includes information on when medicines should be taken and when their dosage should be changed. An action plan may also involve the use of a device called a peak flow meter. A peak flow meter measures how well the lungs are working. It helps you monitor your condition. HOME CARE INSTRUCTIONS   Take medicines only as directed by your health care provider. Speak with your health care provider if you have questions about how or when to take the medicines.  Use a peak flow meter as directed by your health care provider. Record and keep track of readings.  Understand and use the action plan to help minimize or stop an asthma attack without needing to seek medical care.  Control your home environment in the following ways to help prevent asthma attacks:  Do not smoke. Avoid being exposed to secondhand smoke.  Change your heating and air conditioning filter regularly.  Limit your use of fireplaces and wood stoves.  Get rid of pests (such as roaches   and mice) and their droppings.  Throw away plants if you see mold on them.  Clean your floors and dust regularly. Use unscented cleaning products.  Try to have someone else vacuum for you regularly. Stay out of rooms while they are  being vacuumed and for a short while afterward. If you vacuum, use a dust mask from a hardware store, a double-layered or microfilter vacuum cleaner bag, or a vacuum cleaner with a HEPA filter.  Replace carpet with wood, tile, or vinyl flooring. Carpet can trap dander and dust.  Use allergy-proof pillows, mattress covers, and box spring covers.  Wash bed sheets and blankets every week in hot water and dry them in a dryer.  Use blankets that are made of polyester or cotton.  Clean bathrooms and kitchens with bleach. If possible, have someone repaint the walls in these rooms with mold-resistant paint. Keep out of the rooms that are being cleaned and painted.  Wash hands frequently. SEEK MEDICAL CARE IF:   You have wheezing, shortness of breath, or a cough even if taking medicine to prevent attacks.  The colored mucus you cough up (sputum) is thicker than usual.  Your sputum changes from clear or white to yellow, green, gray, or bloody.  You have any problems that may be related to the medicines you are taking (such as a rash, itching, swelling, or trouble breathing).  You are using a reliever medicine more than 2-3 times per week.  Your peak flow is still at 50-79% of your personal best after following your action plan for 1 hour.  You have a fever. SEEK IMMEDIATE MEDICAL CARE IF:   You seem to be getting worse and are unresponsive to treatment during an asthma attack.  You are short of breath even at rest.  You get short of breath when doing very little physical activity.  You have difficulty eating, drinking, or talking due to asthma symptoms.  You develop chest pain.  You develop a fast heartbeat.  You have a bluish color to your lips or fingernails.  You are light-headed, dizzy, or faint.  Your peak flow is less than 50% of your personal best.   This information is not intended to replace advice given to you by your health care provider. Make sure you discuss any  questions you have with your health care provider.   Document Released: 07/01/2005 Document Revised: 03/22/2015 Document Reviewed: 01/28/2013 Elsevier Interactive Patient Education 2016 [employer]  

## 2015-10-09 ENCOUNTER — Encounter: Payer: Self-pay | Admitting: Cardiology

## 2015-10-09 ENCOUNTER — Ambulatory Visit (INDEPENDENT_AMBULATORY_CARE_PROVIDER_SITE_OTHER): Payer: Medicaid Other | Admitting: Cardiology

## 2015-10-09 VITALS — BP 124/74 | HR 84 | Ht 67.0 in | Wt 265.0 lb

## 2015-10-09 DIAGNOSIS — R000 Tachycardia, unspecified: Secondary | ICD-10-CM

## 2015-10-09 DIAGNOSIS — I5022 Chronic systolic (congestive) heart failure: Secondary | ICD-10-CM | POA: Insufficient documentation

## 2015-10-09 DIAGNOSIS — I5021 Acute systolic (congestive) heart failure: Secondary | ICD-10-CM

## 2015-10-09 DIAGNOSIS — I10 Essential (primary) hypertension: Secondary | ICD-10-CM

## 2015-10-09 DIAGNOSIS — I420 Dilated cardiomyopathy: Secondary | ICD-10-CM

## 2015-10-09 DIAGNOSIS — I509 Heart failure, unspecified: Secondary | ICD-10-CM

## 2015-10-09 DIAGNOSIS — R600 Localized edema: Secondary | ICD-10-CM

## 2015-10-09 DIAGNOSIS — I5023 Acute on chronic systolic (congestive) heart failure: Secondary | ICD-10-CM

## 2015-10-09 DIAGNOSIS — I429 Cardiomyopathy, unspecified: Secondary | ICD-10-CM

## 2015-10-09 LAB — HEPATIC FUNCTION PANEL
ALT: 20 U/L (ref 6–29)
AST: 15 U/L (ref 10–30)
Albumin: 4.1 g/dL (ref 3.6–5.1)
Alkaline Phosphatase: 48 U/L (ref 33–115)
Bilirubin, Direct: 0.1 mg/dL (ref <=0.2)
Indirect Bilirubin: 0.2 mg/dL (ref 0.2–1.2)
Total Bilirubin: 0.3 mg/dL (ref 0.2–1.2)
Total Protein: 6.8 g/dL (ref 6.1–8.1)

## 2015-10-09 LAB — BASIC METABOLIC PANEL
BUN: 12 mg/dL (ref 7–25)
CO2: 29 mmol/L (ref 20–31)
Calcium: 9.6 mg/dL (ref 8.6–10.2)
Chloride: 104 mmol/L (ref 98–110)
Creat: 0.7 mg/dL (ref 0.50–1.10)
Glucose, Bld: 87 mg/dL (ref 65–99)
Potassium: 3.8 mmol/L (ref 3.5–5.3)
Sodium: 140 mmol/L (ref 135–146)

## 2015-10-09 LAB — CBC WITH DIFFERENTIAL/PLATELET
Basophils Absolute: 0 10*3/uL (ref 0.0–0.1)
Basophils Relative: 0 % (ref 0–1)
Eosinophils Absolute: 0.6 10*3/uL (ref 0.0–0.7)
Eosinophils Relative: 7 % — ABNORMAL HIGH (ref 0–5)
HCT: 36.9 % (ref 36.0–46.0)
Hemoglobin: 11.8 g/dL — ABNORMAL LOW (ref 12.0–15.0)
Lymphocytes Relative: 24 % (ref 12–46)
Lymphs Abs: 2 10*3/uL (ref 0.7–4.0)
MCH: 25.5 pg — ABNORMAL LOW (ref 26.0–34.0)
MCHC: 32 g/dL (ref 30.0–36.0)
MCV: 79.7 fL (ref 78.0–100.0)
MPV: 10.2 fL (ref 8.6–12.4)
Monocytes Absolute: 0.4 10*3/uL (ref 0.1–1.0)
Monocytes Relative: 5 % (ref 3–12)
Neutro Abs: 5.4 10*3/uL (ref 1.7–7.7)
Neutrophils Relative %: 64 % (ref 43–77)
Platelets: 283 10*3/uL (ref 150–400)
RBC: 4.63 MIL/uL (ref 3.87–5.11)
RDW: 15.5 % (ref 11.5–15.5)
WBC: 8.5 10*3/uL (ref 4.0–10.5)

## 2015-10-09 MED ORDER — FUROSEMIDE 40 MG PO TABS: 40.0000 mg | ORAL_TABLET | Freq: Two times a day (BID) | ORAL | Status: DC

## 2015-10-09 MED ORDER — LOSARTAN POTASSIUM 25 MG PO TABS: 12.5000 mg | ORAL_TABLET | Freq: Every day | ORAL | Status: DC

## 2015-10-09 NOTE — Progress Notes (Signed)
Patient ID: Michelle Kane, female   DOB: 01-14-1986, 30 y.o.   MRN: [id number]      Cardiology Office Note Date:  10/09/2015  Patient ID:  Michelle Kane, Michelle Kane 2011/06/29, MRN [id number] PCP:  Arnette Norris, MD  Cardiologist:  Dr. Meda Coffee   Chief Complaint: Follow-up for cardiomyopathy  History of Present Illness: Michelle Kane is a 30 y.o. female with history of cardiomyopathy (dx during pregnancy, also felt possibly familial), GERD, allergy-induced asthma, sinus tachycardia who presents for f/u.   She was first evaluated by cardiology in 12/2014 in her 17th week of pregnancy complaining of palpitations, CP, SOB. Echo 01/12/15 showed mildly dilated LV, EF 45-50%, trivial MR. TSH WNL. Holter 12/2014: persistent sinus tach; was started on Toprol. She developed hypertension and was later switched to labetolol. F/u echo 02/2015 showed EF 55%. Repeat Holter 03/2015 showed inappropriate sinus tach for 14 hours each day and labetalol was increased. She was admitted 05/2015 to [facility] with acute CHF, orthopnea, LEE, weight gain. She was treated with diuretics that gave her some relief but she was transferred to [facility] given high risk on 05/15/2015 and induced to labor. She eventually delivered a healthy baby boy [name] at 34th week of pregnancy. At f/u appt 05/22/15 she was started on oral Lasix daily. LE venous duplex 05/23/15 was negative for DVT. At f/u appointment 06/06/15, she was reporting 12lb weight gain in 3 weeks. Repeat echo 06/06/15 showed mildly dilated LV, EF 50-55% although Dr. Francesca Oman office note from that day indicates EF was 40-45%. She was given IV Lasix in the office and Lasix was increased to 40mg  BID. Labs were unremarkable except mild anemia Hgb 10.1 and BNP surprisingly normal. At one point it appears labetalol was changed to carvedilol and lisinopril was added. She was seen in the ER twice the week of Christmas with ongoing DOE without orthopnea, PND or LE edema. She was also reporting  weight gain and night sweats. She had been prescribed inhalers but did not pick them up due to cost. Her symptoms were not felt typical for CHF as her BNP was only 8 and CXR 07/09/15 was normal, along with negative d-dimer. Dr. Meda Coffee saw her in f/u 07/12/15 and suspected her problem was now not cardiac but instead pulm/ENT. TSH WNL, Hgb 11.2 (stable), WBc 8.6 (down from 11.4 in the ER), BMET unremarkable. Dr. Meda Coffee prescribed albuterol, azithromycin, and steroid taper, and referred the patient to pulmonology. F/u echo 07/13/15: EF 50%, no RWMA, normal diastolic parameters. Of note, she had had two prior pregnancies (ages 77, 27) with similar symptoms and no complications. She has a FH of premature CAD, grandfather had MI, CABG in his 11s, GGF died of CHF. Her bother and uncle also have dilated cardiomyopathy.   10/09/2015 - she comes for follow-up, she has been through significant stress, with her baby that is now 30 months old going to the ER with respiratory distress being diagnosed with RSV and influenza pneumonia requiring ventilation for 2 weeks. The baby has recovered air is now at home. The patient herself feels okay she is able to found a job for the [employer]. She denies any chest pain, shortness of breath, orthopnea, paroxysmal nocturnal dyspnea no syncope. She is compliant to her meds. Her losartan is still on hold. She is not breast-feeding anymore.   Past Medical History  Diagnosis Date  . GERD (gastroesophageal reflux disease)   . Allergy-induced asthma   . 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  History of ovarian cyst   . Pelvic pain in female   . History of colon polyps   . Sinus tachycardia ([place])     a. 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 b. 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  . Peripartum cardiomyopathy     a. EF 40-45% in 2016, varying by several echoes. F/u echo 06/2015 showed EF 50%.   . Chronic systolic CHF  (congestive heart failure) ([place])   . Essential hypertension     Past Surgical History  Procedure Laterality Date  . Cesarean section  03-01-2008  . Im pinning right elbow fx  [place]  . Laparoscopy N/A 07/29/2014    Procedure: LAPAROSCOPY DIAGNOSTIC;  Surgeon: Cyril Mourning, MD;  Location: [facility];  Service: Gynecology;  Laterality: N/A;    Current Outpatient Prescriptions  Medication Sig Dispense Refill  . albuterol (PROVENTIL) (2.5 MG/3ML) 0.083% nebulizer solution Take 3-6 mLs (2.5-5 mg total) by nebulization every 4 (four) hours as needed for wheezing or shortness of breath. 30 vial 0  . famotidine (PEPCID) 20 MG tablet One at bedtime 30 tablet 11  . famotidine-calcium carbonate-magnesium hydroxide (PEPCID COMPLETE) 10-800-165 MG CHEW chewable tablet Chew 2 tablets by mouth daily as needed (heartburn).    . furosemide (LASIX) 40 MG tablet Take 1 tablet (40 mg total) by mouth 2 (two) times daily. 90 tablet 3  . losartan (COZAAR) 25 MG tablet Take 1 tablet (25 mg total) by mouth daily. 30 tablet 11  . metoprolol tartrate (LOPRESSOR) 25 MG tablet Take 1 tablet (25 mg total) by mouth daily. 90 tablet 2  . pantoprazole (PROTONIX) 40 MG tablet Take 1 tablet (40 mg total) by mouth daily. Take 30-60 min before first meal of the day 30 tablet 2   No current facility-administered medications for this visit.    Allergies:   Vicodin   Social History:  The patient  reports that she quit smoking about 7 months ago. Her smoking use included Cigarettes. She has a 5 pack-year smoking history. She has never used smokeless tobacco. She reports that she does not drink alcohol or use illicit drugs.   Family History:  The patient's family history includes Asthma in her mother; COPD in her father and maternal grandmother; Colon polyps in her mother; Crohn's disease in her mother; Diabetes in her paternal grandfather; Heart attack in her maternal grandfather; Heart  failure in her brother, paternal aunt, and paternal uncle; Hemophilia in her father and son; Hyperlipidemia in her father; Lung cancer in her maternal grandmother; Stroke in her paternal grandfather. There is no history of Hypertension.   ROS:  Please see the history of present illness.  All other systems are reviewed and otherwise negative.   PHYSICAL EXAM:  VS:  BP 124/74 mmHg  Pulse 84  Ht 5\' 7"  (1.702 m)  Wt 265 lb (120.203 kg)  BMI 41.50 kg/m2  LMP 08/09/2015 BMI: Body mass index is 41.5 kg/(m^2). Well nourished, well developed obese WF in no acute distress HEENT: normocephalic, atraumatic Neck: no JVD, carotid bruits or masses Cardiac:  normal S1, S2; RRR with borderline elevated rate; no murmurs, rubs, or gallops Lungs:  clear to auscultation bilaterally, no wheezing, rhonchi or rales Abd: soft, nontender, no hepatomegaly, + BS MS: no deformity or atrophy Ext: no edema Skin: warm and dry, no rash Neuro:  moves all extremities spontaneously, no focal abnormalities noted, follows commands Psych: euthymic mood,  full affect  EKG:  Done today shows sinus tach 102bpm otherwise normal EKG  Recent Labs: 07/09/2015: ALT 15; B Natriuretic Peptide 7.9 07/12/2015: BUN 9; Creat 0.60; Hemoglobin 11.2*; Platelets 282; Potassium 3.9; Sodium 141; TSH 1.493  No results found for requested labs within last 365 days.   CrCl cannot be calculated (Patient has no serum creatinine result on file.).   Wt Readings from Last 3 Encounters:  10/09/15 265 lb (120.203 kg)  09/24/15 265 lb (120.203 kg)  07/20/15 258 lb (117.028 kg)     Other studies reviewed: Additional studies/records reviewed today include: summarized above    ASSESSMENT AND PLAN:  1. Nonischemic dilated cardiomyopathy- with most recent LVEF 50%, we held losartan as she was breast-feeding her premature baby however she is not breast-feeding anymore we'll start a very low dose of losartan 12.5 mg daily as her blood pressure is  rather low. She was not tolerant to ACE inhibitor in the past. We will continue metoprolol.  2. Acute on chronic systolic CHF - the patient appears euvolemic today twice a day as she said that she develops lower extremity edema when she misses the dose. 3. Hypertension - well controlled continue the same regimen. 4. Sinus tachycardia - now better controlled on metoprolol.  Disposition: Follow-up in 2 months.  Corliss Blacker, MD 10/09/2015 9:18 AM     [facility] [address] [place] [address] [phone] (office)  [phone] (fax)

## 2015-10-09 NOTE — Patient Instructions (Signed)
Medication Instructions:  Your physician has recommended you make the following change in your medication:  Start losartan 12.5 mg by mouth daily at bedtime   Labwork: Lab work to be done today--BMP, CBC, liver function test.  Testing/Procedures: none  Follow-Up: Your physician recommends that you schedule a follow-up appointment in: 2 months with Dr. Meda Coffee   Any Other Special Instructions Will Be Listed Below (If Applicable).     If you need a refill on your cardiac medications before your next appointment, please call your pharmacy.

## 2015-11-27 ENCOUNTER — Ambulatory Visit: Payer: Medicaid Other | Admitting: Family Medicine

## 2015-11-27 DIAGNOSIS — Z0289 Encounter for other administrative examinations: Secondary | ICD-10-CM

## 2015-12-13 ENCOUNTER — Ambulatory Visit (INDEPENDENT_AMBULATORY_CARE_PROVIDER_SITE_OTHER): Payer: 59 | Admitting: Family Medicine

## 2015-12-13 ENCOUNTER — Encounter: Payer: Self-pay | Admitting: Cardiology

## 2015-12-13 ENCOUNTER — Ambulatory Visit (INDEPENDENT_AMBULATORY_CARE_PROVIDER_SITE_OTHER): Payer: 59 | Admitting: Cardiology

## 2015-12-13 VITALS — BP 108/84 | HR 84 | Ht 67.0 in | Wt 253.8 lb

## 2015-12-13 DIAGNOSIS — I429 Cardiomyopathy, unspecified: Secondary | ICD-10-CM

## 2015-12-13 DIAGNOSIS — I10 Essential (primary) hypertension: Secondary | ICD-10-CM | POA: Diagnosis not present

## 2015-12-13 DIAGNOSIS — R0602 Shortness of breath: Secondary | ICD-10-CM | POA: Diagnosis not present

## 2015-12-13 DIAGNOSIS — R000 Tachycardia, unspecified: Secondary | ICD-10-CM

## 2015-12-13 DIAGNOSIS — I420 Dilated cardiomyopathy: Secondary | ICD-10-CM

## 2015-12-13 MED ORDER — ALBUTEROL SULFATE HFA 108 (90 BASE) MCG/ACT IN AERS: 2.0000 | INHALATION_SPRAY | Freq: Four times a day (QID) | RESPIRATORY_TRACT | Status: DC | PRN

## 2015-12-13 NOTE — Progress Notes (Signed)
Patient ID: Michelle Kane, female   DOB: 09-27-1985, 30 y.o.   MRN: [id number]      Cardiology Office Note Date:  12/13/2015  Patient ID:  Kane, Michelle [birth year], MRN [id number] PCP:  Arnette Norris, MD  Cardiologist:  Dr. Meda Coffee   Chief Complaint: Follow-up for cardiomyopathy  History of Present Illness: [name] is a 30 y.o. female with history of cardiomyopathy (dx during pregnancy, also felt possibly familial), GERD, allergy-induced asthma, sinus tachycardia who presents for f/u.   She was first evaluated by cardiology in 12/2014 in her 17th week of pregnancy complaining of palpitations, CP, SOB. Echo 01/12/15 showed mildly dilated LV, EF 45-50%, trivial MR. TSH WNL. Holter 12/2014: persistent sinus tach; was started on Toprol. She developed hypertension and was later switched to labetolol. F/u echo 02/2015 showed EF 55%. Repeat Holter 03/2015 showed inappropriate sinus tach for 14 hours each day and labetalol was increased. She was admitted 05/2015 to [facility] with acute CHF, orthopnea, LEE, weight gain. She was treated with diuretics that gave her some relief but she was transferred to [facility] given high risk on 05/15/2015 and induced to labor. She eventually delivered a healthy baby boy [name] at 34th week of pregnancy. At f/u appt 05/22/15 she was started on oral Lasix daily. LE venous duplex 05/23/15 was negative for DVT. At f/u appointment 06/06/15, she was reporting 12lb weight gain in 3 weeks. Repeat echo 06/06/15 showed mildly dilated LV, EF 50-55% although Dr. Francesca Oman office note from that day indicates EF was 40-45%. She was given IV Lasix in the office and Lasix was increased to 40mg  BID. Labs were unremarkable except mild anemia Hgb 10.1 and BNP surprisingly normal. At one point it appears labetalol was changed to carvedilol and lisinopril was added. She was seen in the ER twice the week of Christmas with ongoing DOE without orthopnea, PND or LE edema. She was also reporting  weight gain and night sweats. She had been prescribed inhalers but did not pick them up due to cost. Her symptoms were not felt typical for CHF as her BNP was only 8 and CXR 07/09/15 was normal, along with negative d-dimer. Dr. Meda Coffee saw her in f/u 07/12/15 and suspected her problem was now not cardiac but instead pulm/ENT. TSH WNL, Hgb 11.2 (stable), WBc 8.6 (down from 11.4 in the ER), BMET unremarkable. Dr. Meda Coffee prescribed albuterol, azithromycin, and steroid taper, and referred the patient to pulmonology. F/u echo 07/13/15: EF 50%, no RWMA, normal diastolic parameters. Of note, she had had two prior pregnancies (ages 41, 37) with similar symptoms and no complications. She has a FH of premature CAD, grandfather had MI, CABG in his 67s, GGF died of CHF. Her bother and uncle also have dilated cardiomyopathy.   10/09/2015 - she comes for follow-up, she has been through significant stress, with her baby that is now 63 months old going to the ER with respiratory distress being diagnosed with RSV and influenza pneumonia requiring ventilation for 2 weeks. The baby has recovered air is now at home. The patient herself feels okay she is able to found a job for the [employer]. She denies any chest pain, shortness of breath, orthopnea, paroxysmal nocturnal dyspnea no syncope. She is compliant to her meds. Her losartan is still on hold. She is not breast-feeding anymore.   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 Her son is doing great he just  turned 7 months today. The patient started to exercise and has lost 12 pounds but would have expected to loose more by now, and is advised to go and visit with the nutritionist. She is considering that if this approach fails she'll consider gastric bypass. She denies any lower extremity edema, occasional paroxysmal nocturnal dyspnea, no palpitations or syncope. She is compliant with her  medicines.  Past Medical History  Diagnosis Date  . GERD (gastroesophageal reflux disease)   . Allergy-induced asthma   . 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 History of ovarian cyst   . Pelvic pain in female   . History of colon polyps   . Sinus tachycardia ([place])     a. 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 b. 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  . Peripartum cardiomyopathy     a. EF 40-45% in 2016, varying by several echoes. F/u echo 06/2015 showed EF 50%.   . Chronic systolic CHF (congestive heart failure) ([place])   . Essential hypertension     Past Surgical History  Procedure Laterality Date  . Cesarean section  03-01-2008  . Im pinning right elbow fx  [place]  . Laparoscopy N/A 07/29/2014    Procedure: LAPAROSCOPY DIAGNOSTIC;  Surgeon: Cyril Mourning, MD;  Location: [facility];  Service: Gynecology;  Laterality: N/A;    Current Outpatient Prescriptions  Medication Sig Dispense Refill  . albuterol (PROVENTIL) (2.5 MG/3ML) 0.083% nebulizer solution Take 3-6 mLs (2.5-5 mg total) by nebulization every 4 (four) hours as needed for wheezing or shortness of breath. 30 vial 0  . famotidine (PEPCID) 20 MG tablet One at bedtime 30 tablet 11  . famotidine-calcium carbonate-magnesium hydroxide (PEPCID COMPLETE) 10-800-165 MG CHEW chewable tablet Chew 2 tablets by mouth daily as needed (heartburn).    . furosemide (LASIX) 40 MG tablet Take 1 tablet (40 mg total) by mouth 2 (two) times daily. 60 tablet 11  . losartan (COZAAR) 25 MG tablet Take 0.5 tablets (12.5 mg total) by mouth at bedtime. 15 tablet 11  . metoprolol tartrate (LOPRESSOR) 25 MG tablet Take 1 tablet (25 mg total) by mouth daily. 90 tablet 2  . pantoprazole (PROTONIX) 40 MG tablet Take 1 tablet (40 mg total) by mouth daily. Take 30-60 min before first meal of the day 30 tablet 2  . PROAIR HFA 108 (90 Base) MCG/ACT inhaler Inhale 2  puffs into the lungs every 6 (six) hours as needed. AS NEEDED FOR WHEEZING OR SOB  0   No current facility-administered medications for this visit.    Allergies:   Vicodin   Social History:  The patient  reports that she quit smoking about 9 months ago. Her smoking use included Cigarettes. She has a 5 pack-year smoking history. She has never used smokeless tobacco. She reports that she does not drink alcohol or use illicit drugs.   Family History:  The patient's family history includes Asthma in her mother; COPD in her father and maternal grandmother; Colon polyps in her mother; Crohn's disease in her mother; Diabetes in her paternal grandfather; Heart attack in her maternal grandfather; Heart failure in her brother, paternal aunt, and paternal uncle; Hemophilia in her father and son; Hyperlipidemia in her father; Lung cancer in her maternal grandmother; Stroke in her paternal grandfather. There is no history of Hypertension.   ROS:  Please see the history of present illness.  All other systems are reviewed and  otherwise negative.   PHYSICAL EXAM:  VS:  BP 108/84 mmHg  Pulse 84  Ht 5\' 7"  (1.702 m)  Wt 253 lb 12.8 oz (115.123 kg)  BMI 39.74 kg/m2 BMI: Body mass index is 39.74 kg/(m^2). Well nourished, well developed obese WF in no acute distress HEENT: normocephalic, atraumatic Neck: no JVD, carotid bruits or masses Cardiac:  normal S1, S2; RRR with borderline elevated rate; no murmurs, rubs, or gallops Lungs:  clear to auscultation bilaterally, no wheezing, rhonchi or rales Abd: soft, nontender, no hepatomegaly, + BS MS: no deformity or atrophy Ext: no edema Skin: warm and dry, no rash Neuro:  moves all extremities spontaneously, no focal abnormalities noted, follows commands Psych: euthymic mood, full affect  EKG: not done today  Recent Labs: 07/09/2015: B Natriuretic Peptide 7.9 07/12/2015: TSH 1.493 10/09/2015: ALT 20; BUN 12; Creat 0.70; Hemoglobin 11.8*; Platelets 283;  Potassium 3.8; Sodium 140  No results found for requested labs within last 365 days.   CrCl cannot be calculated (Patient has no serum creatinine result on file.).   Wt Readings from Last 3 Encounters:  12/13/15 253 lb 12.8 oz (115.123 kg)  12/13/15 254 lb (115.214 kg)  10/09/15 265 lb (120.203 kg)     Other studies reviewed: Additional studies/records reviewed today include: summarized above  TTE: 06/2015 Left ventricle: The cavity size was normal. Systolic function was  mildly reduced. The estimated ejection fraction was 50%. Wall  motion was normal; there were no regional wall motion  abnormalities. Left ventricular diastolic function parameters  were normal. - Aortic valve: Trileaflet; normal thickness leaflets. There was no  regurgitation. - Aortic root: The aortic root was normal in size. - Mitral valve: Structurally normal valve. Transvalvular velocity  was within the normal range. There was no evidence for stenosis.  There was trivial regurgitation. - Right ventricle: The cavity size was normal. Wall thickness was  normal. Systolic function was normal. - Tricuspid valve: There was mild regurgitation. - Pulmonary arteries: Systolic pressure was within the normal  range. - Inferior vena cava: The vessel was normal in size. - Pericardium, extracardiac: There was no pericardial effusion.  Impressions:  - Compared to the prior study from 06/06/2015 LVEF is now mildly  improved estomated at 50% with mild global hypokinesis,  previously 40-45%.  Global longitudinal strain is -18%. Lateral S prima is 12 cm/sec.   ASSESSMENT AND PLAN:  1. Nonischemic dilated cardiomyopathy- with most recent LVEF 50%, we restarted low-dose dose losartan 12.5 mg daily once she stopped breast-feeding. We'll continue metoprolol.  2. Acute on chronic systolic CHF - the patient appears euvolemic today, NYHA class II a, she continues to take furosemide 40 mg daily.. 3. Hypertension  - well controlled continue the same regimen. 4. Sinus tachycardia - controlled on metoprolol. 5. Obesity - she strongly advised to try diet and exercise for now she has already lost some weight considering her underlying heart problem I would try to stay away from gastric bypass surgery.  She was given refill for albuterol today.  Disposition: Follow-up in 3 months.  Signed, Ena Dawley, MD 12/13/2015 9:12 AM     [facility] [address] [place] [address] [phone] (office)  [phone] (fax)

## 2015-12-13 NOTE — Patient Instructions (Signed)
Good to see you. We will call you with your nutritionist appointment.

## 2015-12-13 NOTE — Progress Notes (Signed)
Subjective:   Patient ID: Michelle Kane, female    DOB: 06-14-1986, 30 y.o.   MRN: [id number]  Michelle Kane is a pleasant 30 y.o. year old female whom I have not seen since 2015,  presents to clinic today with Follow-up  on 12/13/2015  HPI:  When I last saw her in 11.2015, she was complaining of RLQ abdominal pain that was being worked up by Dr. Helane Rima.  Underwent diagnostic laparoscopy on 07/29/14 which was unrevealing.  She then became pregnant and had a very eventful pregnancy.  During the pregnancy, diagnosed with tachycardia, cardiomyopathy, LVH, HTN, pre eclampsia during pregnancy.  [name] is now doing well, he is 57 months old.  Has cardiologist, Dr. Meda Coffee. Sees her later today.  Currently taking Metoprolol Cozaar, lasix.  Here today because she wants to discuss weight loss. Can't seem to lose weight.  Not exercising but she is trying to cut back on what she eats.  Because of her heart, she does not eat much salt.  Wt Readings from Last 3 Encounters:  12/13/15 254 lb (115.214 kg)  10/09/15 265 lb (120.203 kg)  09/24/15 265 lb (120.203 kg)   Lab Results  Component Value Date   TSH 1.493 07/12/2015   Lab Results  Component Value Date   ALT 20 10/09/2015   AST 15 10/09/2015   ALKPHOS 48 10/09/2015   BILITOT 0.3 10/09/2015   Lab Results  Component Value Date   NA 140 10/09/2015   K 3.8 10/09/2015   CL 104 10/09/2015   CO2 29 10/09/2015     Current Outpatient Prescriptions on File Prior to Visit  Medication Sig Dispense Refill  . albuterol (PROVENTIL) (2.5 MG/3ML) 0.083% nebulizer solution Take 3-6 mLs (2.5-5 mg total) by nebulization every 4 (four) hours as needed for wheezing or shortness of breath. 30 vial 0  . famotidine (PEPCID) 20 MG tablet One at bedtime 30 tablet 11  . famotidine-calcium carbonate-magnesium hydroxide (PEPCID COMPLETE) 10-800-165 MG CHEW chewable tablet Chew 2 tablets by mouth daily as needed (heartburn).    . furosemide (LASIX) 40 MG  tablet Take 1 tablet (40 mg total) by mouth 2 (two) times daily. 60 tablet 11  . losartan (COZAAR) 25 MG tablet Take 0.5 tablets (12.5 mg total) by mouth at bedtime. 15 tablet 11  . metoprolol tartrate (LOPRESSOR) 25 MG tablet Take 1 tablet (25 mg total) by mouth daily. 90 tablet 2  . pantoprazole (PROTONIX) 40 MG tablet Take 1 tablet (40 mg total) by mouth daily. Take 30-60 min before first meal of the day 30 tablet 2   No current facility-administered medications on file prior to visit.    Allergies  Allergen Reactions  . Vicodin [Hydrocodone-Acetaminophen] Itching, Nausea And Vomiting and Other (See Comments)    GI pains also    Past Medical History  Diagnosis Date  . GERD (gastroesophageal reflux disease)   . Allergy-induced asthma   . 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 History of ovarian cyst   . Pelvic pain in female   . History of colon polyps   . 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 b. 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  . Peripartum cardiomyopathy     a. EF 40-45% in 2016, varying by several echoes. F/u echo 06/2015 showed EF 50%.   . Chronic systolic CHF (congestive heart failure) ([place])   . Essential hypertension  Past Surgical History  Procedure Laterality Date  . Cesarean section  03-01-2008  . Im pinning right elbow fx  [place]  . Laparoscopy N/A 07/29/2014    Procedure: LAPAROSCOPY DIAGNOSTIC;  Surgeon: Cyril Mourning, MD;  Location: [facility];  Service: Gynecology;  Laterality: N/A;    Family History  Problem Relation Age of Onset  . Asthma Mother   . Crohn's disease Mother   . Colon polyps Mother   . Hyperlipidemia Father   . Hemophilia Father   . Hemophilia Son   . Heart attack Maternal Grandfather   . Diabetes Paternal Grandfather   . Heart failure Brother   . Heart failure Paternal Uncle   . Heart failure Paternal  Aunt   . Hypertension Neg Hx   . Stroke Paternal Grandfather   . COPD Maternal Grandmother     smoked  . Lung cancer Maternal Grandmother     smoked  . COPD Father     smoked    Social History   Social History  . Marital Status: Significant Other    Spouse Name: N/A  . Number of Children: N/A  . Years of Education: N/A   Occupational History  . CNA     Social History Main Topics  . Smoking status: Former Smoker -- 0.50 packs/day for 10 years    Types: Cigarettes    Quit date: 02/13/2015  . Smokeless tobacco: Never Used  . Alcohol Use: No     Comment: social  . Drug Use: No  . Sexual Activity: Yes    Birth Control/ Protection: None   Other Topics Concern  . Not on file   Social History Narrative   The PMH, PSH, Social History, Family History, Medications, and allergies have been reviewed in [facility], and have been updated if relevant.  Review of Systems  Constitutional: Positive for unexpected weight change.  Eyes: Negative.   Respiratory: Negative.   Cardiovascular: Negative.   Gastrointestinal: Negative.   Endocrine: Negative.   Musculoskeletal: Negative.   Skin: Negative.   Neurological: Negative.   All other systems reviewed and are negative.      Objective:    BP 112/82 mmHg  Pulse 95  Temp(Src) 97.4 F (36.3 C) (Oral)  Wt 254 lb (115.214 kg)  SpO2 98%   Physical Exam  Constitutional: She is oriented to person, place, and time. She appears well-developed and well-nourished.  obese  HENT:  Head: Normocephalic.  Eyes: Conjunctivae are normal.  Cardiovascular: Normal rate.   Pulmonary/Chest: Effort normal.  Musculoskeletal: Normal range of motion.  Neurological: She is alert and oriented to person, place, and time. No cranial nerve deficit.  Skin: Skin is dry.  Psychiatric: She has a normal mood and affect. Her behavior is normal. Judgment and thought content normal.  Nursing note and vitals reviewed.         Assessment & Plan:    Cardiomyopathy ([place])  Nonischemic congestive cardiomyopathy (HCC)  SOB (shortness of breath) No Follow-up on file.

## 2015-12-13 NOTE — Assessment & Plan Note (Signed)
>  25 minutes spent in face to face time with patient, >50% spent in counselling or coordination of care discussing her obesity, cardiomyopathy, and CHF. Advised that I cannot place her on diet pills and she agreed with this. She did agree to nutritionist referral. Referral placed.

## 2015-12-13 NOTE — Patient Instructions (Signed)
Medication Instructions:   Your physician recommends that you continue on your current medications as directed. Please refer to the Current Medication list given to you today.     Testing/Procedures:  Your physician has requested that you have an echocardiogram. Echocardiography is a painless test that uses sound waves to create images of your heart. It provides your doctor with information about the size and shape of your heart and how well your heart's chambers and valves are working. This procedure takes approximately one hour. There are no restrictions for this procedure.  HAVE THIS SCHEDULED FOR SOMETIME NEXT MONTH    Follow-Up:  3 MONTHS WITH DR Meda Coffee       If you need a refill on your cardiac medications before your next appointment, please call your pharmacy.

## 2015-12-13 NOTE — Progress Notes (Signed)
Pre visit review using our clinic review tool, if applicable. No additional management support is needed unless otherwise documented below in the visit note. 

## 2015-12-19 ENCOUNTER — Encounter: Payer: Self-pay | Admitting: Family Medicine

## 2015-12-19 ENCOUNTER — Encounter: Payer: Self-pay | Admitting: *Deleted

## 2015-12-19 ENCOUNTER — Ambulatory Visit (INDEPENDENT_AMBULATORY_CARE_PROVIDER_SITE_OTHER): Payer: 59 | Admitting: Family Medicine

## 2015-12-19 VITALS — BP 116/74 | HR 86 | Temp 97.6°F | Ht 66.75 in | Wt 253.0 lb

## 2015-12-19 DIAGNOSIS — Z0000 Encounter for general adult medical examination without abnormal findings: Secondary | ICD-10-CM | POA: Diagnosis not present

## 2015-12-19 DIAGNOSIS — I429 Cardiomyopathy, unspecified: Secondary | ICD-10-CM

## 2015-12-19 DIAGNOSIS — G2581 Restless legs syndrome: Secondary | ICD-10-CM | POA: Diagnosis not present

## 2015-12-19 DIAGNOSIS — Z01419 Encounter for gynecological examination (general) (routine) without abnormal findings: Secondary | ICD-10-CM

## 2015-12-19 DIAGNOSIS — Z0001 Encounter for general adult medical examination with abnormal findings: Secondary | ICD-10-CM

## 2015-12-19 DIAGNOSIS — F411 Generalized anxiety disorder: Secondary | ICD-10-CM | POA: Diagnosis not present

## 2015-12-19 DIAGNOSIS — I10 Essential (primary) hypertension: Secondary | ICD-10-CM

## 2015-12-19 DIAGNOSIS — I420 Dilated cardiomyopathy: Secondary | ICD-10-CM

## 2015-12-19 LAB — LIPID PANEL
CHOL/HDL RATIO: 5
Cholesterol: 190 mg/dL (ref 0–200)
HDL: 36.2 mg/dL — ABNORMAL LOW (ref >39.00)
LDL Cholesterol: 115 mg/dL — ABNORMAL HIGH (ref 0–99)
NONHDL: 153.97
Triglycerides: 194 mg/dL — ABNORMAL HIGH (ref 0.0–149.0)
VLDL: 38.8 mg/dL (ref 0.0–40.0)

## 2015-12-19 LAB — COMPREHENSIVE METABOLIC PANEL
ALT: 16 U/L (ref 0–35)
AST: 17 U/L (ref 0–37)
Albumin: 4.4 g/dL (ref 3.5–5.2)
Alkaline Phosphatase: 58 U/L (ref 39–117)
BILIRUBIN TOTAL: 0.3 mg/dL (ref 0.2–1.2)
BUN: 12 mg/dL (ref 6–23)
CALCIUM: 9.4 mg/dL (ref 8.4–10.5)
CO2: 28 meq/L (ref 19–32)
CREATININE: 0.66 mg/dL (ref 0.40–1.20)
Chloride: 103 mEq/L (ref 96–112)
GFR: 111.92 mL/min (ref >60.00)
GLUCOSE: 89 mg/dL (ref 70–99)
Potassium: 3.7 mEq/L (ref 3.5–5.1)
Sodium: 139 mEq/L (ref 135–145)
Total Protein: 7.4 g/dL (ref 6.0–8.3)

## 2015-12-19 LAB — CBC WITH DIFFERENTIAL/PLATELET
BASOS ABS: 0 10*3/uL (ref 0.0–0.1)
Basophils Relative: 0.5 % (ref 0.0–3.0)
EOS ABS: 0.3 10*3/uL (ref 0.0–0.7)
Eosinophils Relative: 4 % (ref 0.0–5.0)
HEMATOCRIT: 38.1 % (ref 36.0–46.0)
Hemoglobin: 12.6 g/dL (ref 12.0–15.0)
LYMPHS PCT: 27.2 % (ref 12.0–46.0)
Lymphs Abs: 2.1 10*3/uL (ref 0.7–4.0)
MCHC: 33 g/dL (ref 30.0–36.0)
MCV: 80.8 fl (ref 78.0–100.0)
Monocytes Absolute: 0.4 10*3/uL (ref 0.1–1.0)
Monocytes Relative: 5.5 % (ref 3.0–12.0)
NEUTROS ABS: 5 10*3/uL (ref 1.4–7.7)
NEUTROS PCT: 62.8 % (ref 43.0–77.0)
PLATELETS: 241 10*3/uL (ref 150.0–400.0)
RBC: 4.72 Mil/uL (ref 3.87–5.11)
RDW: 16.4 % — ABNORMAL HIGH (ref 11.5–15.5)
WBC: 7.9 10*3/uL (ref 4.0–10.5)

## 2015-12-19 LAB — VITAMIN B12: VITAMIN B 12: 432 pg/mL (ref 211–911)

## 2015-12-19 LAB — TSH: TSH: 0.97 u[IU]/mL (ref 0.35–4.50)

## 2015-12-19 MED ORDER — PANTOPRAZOLE SODIUM 40 MG PO TBEC: 40.0000 mg | DELAYED_RELEASE_TABLET | Freq: Every day | ORAL | Status: DC

## 2015-12-19 MED ORDER — DIAZEPAM 2 MG PO TABS: 2.0000 mg | ORAL_TABLET | Freq: Two times a day (BID) | ORAL | Status: DC | PRN

## 2015-12-19 MED ORDER — FAMOTIDINE 20 MG PO TABS: ORAL_TABLET | ORAL | Status: DC

## 2015-12-19 MED FILL — PANTOPRAZOLE SOD DR 40 MG T: 40 | 90 days supply | Qty: 90 | Fill #0

## 2015-12-19 MED FILL — FAMOTIDINE 20 MG TABLET: 20 | 90 days supply | Qty: 90 | Fill #0

## 2015-12-19 NOTE — Assessment & Plan Note (Signed)
Deteriorated. Check labs today for causes of possible worsening RLS. If unremarkable, consider starting neurontin. The patient indicates understanding of these issues and agrees with the plan.

## 2015-12-19 NOTE — Assessment & Plan Note (Signed)
Deteriorated. Rx for valium given to pt.  Discussed sedation and addiction potential. Call or return to clinic prn if these symptoms worsen or fail to improve as anticipated. The patient indicates understanding of these issues and agrees with the plan.

## 2015-12-19 NOTE — Progress Notes (Signed)
Pre visit review using our clinic review tool, if applicable. No additional management support is needed unless otherwise documented below in the visit note. 

## 2015-12-19 NOTE — Assessment & Plan Note (Signed)
Reviewed preventive care protocols, scheduled due services, and updated immunizations Discussed nutrition, exercise, diet, and healthy lifestyle. Orders Placed This Encounter  Procedures  . CBC with Differential/Platelet  . Comprehensive metabolic panel  . Lipid panel  . TSH  . Vitamin B12

## 2015-12-19 NOTE — Assessment & Plan Note (Signed)
Followed by cardiology 

## 2015-12-19 NOTE — Addendum Note (Signed)
Addended by: Marchia Bond on: 12/19/2015 09:47 AM   Modules accepted: Miquel Dunn

## 2015-12-19 NOTE — Progress Notes (Signed)
Subjective:   Patient ID: Michelle Kane, female    DOB: 07-Sep-1985, 30 y.o.   MRN: [id number]  Michelle Kane is a pleasant 30 y.o. year old female whom I have not seen since 2015,  presents to clinic today with Annual Exam; RLS; and Anxiety  on 12/19/2015  HPI:   Pap smear scheduled for 6/21 with Dr. Helane Rima.  Cardiomyopathy, sinus tachycardia- followed by Dr. Meda Coffee, cardiology.  Has cardiologist, Dr. Meda Coffee. Last saw her on 12/13/15.  Note reviewed. Currently taking Metoprolol Cozaar, lasix.  No changes made to her rxs.  Scheduled for echo on 01/03/2016.  Feels anxiety has worsened with her heart issues. Having panic attacks.  Has been on multiple SSRIs, most recently cymbalta.  Was followed by psychiatry.  Does not want to restart them.  She is asking for prn valium to use ONLY for severe panic attacks.  RLS has also worsened over past few months, especially at night.  Nothing seems to make it better.  Wt Readings from Last 3 Encounters:  12/19/15 253 lb (114.76 kg)  12/13/15 253 lb 12.8 oz (115.123 kg)  12/13/15 254 lb (115.214 kg)   Lab Results  Component Value Date   TSH 1.493 07/12/2015   Lab Results  Component Value Date   ALT 20 10/09/2015   AST 15 10/09/2015   ALKPHOS 48 10/09/2015   BILITOT 0.3 10/09/2015   Lab Results  Component Value Date   NA 140 10/09/2015   K 3.8 10/09/2015   CL 104 10/09/2015   CO2 29 10/09/2015     Current Outpatient Prescriptions on File Prior to Visit  Medication Sig Dispense Refill  . albuterol (PROVENTIL HFA;VENTOLIN HFA) 108 (90 Base) MCG/ACT inhaler Inhale 2 puffs into the lungs every 6 (six) hours as needed for wheezing or shortness of breath. 1 Inhaler 3  . furosemide (LASIX) 40 MG tablet Take 1 tablet (40 mg total) by mouth 2 (two) times daily. 60 tablet 11  . losartan (COZAAR) 25 MG tablet Take 0.5 tablets (12.5 mg total) by mouth at bedtime. 15 tablet 11  . metoprolol tartrate (LOPRESSOR) 25 MG tablet Take 1 tablet (25 mg  total) by mouth daily. 90 tablet 2   No current facility-administered medications on file prior to visit.    Allergies  Allergen Reactions  . Vicodin [Hydrocodone-Acetaminophen] Itching, Nausea And Vomiting and Other (See Comments)    GI pains also    Past Medical History  Diagnosis Date  . GERD (gastroesophageal reflux disease)   . Allergy-induced asthma   . 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 History of ovarian cyst   . Pelvic pain in female   . History of colon polyps   . 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 b. 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  . Peripartum cardiomyopathy     a. EF 40-45% in 2016, varying by several echoes. F/u echo 06/2015 showed EF 50%.   . Chronic systolic CHF (congestive heart failure) ([place])   . Essential hypertension     Past Surgical History  Procedure Laterality Date  . Cesarean section  03-01-2008  . Im pinning right elbow fx  [place]  . Laparoscopy N/A 07/29/2014    Procedure: LAPAROSCOPY DIAGNOSTIC;  Surgeon: Cyril Mourning, MD;  Location: [facility];  Service: Gynecology;  Laterality: N/A;    Family History  Problem  Relation Age of Onset  . Asthma Mother   . Crohn's disease Mother   . Colon polyps Mother   . Hyperlipidemia Father   . Hemophilia Father   . Hemophilia Son   . Heart attack Maternal Grandfather   . Diabetes Paternal Grandfather   . Heart failure Brother   . Heart failure Paternal Uncle   . Heart failure Paternal Aunt   . Hypertension Neg Hx   . Stroke Paternal Grandfather   . COPD Maternal Grandmother     smoked  . Lung cancer Maternal Grandmother     smoked  . COPD Father     smoked    Social History   Social History  . Marital Status: Significant Other    Spouse Name: N/A  . Number of Children: N/A  . Years of Education: N/A   Occupational History  . CNA     Social History  Main Topics  . Smoking status: Former Smoker -- 0.50 packs/day for 10 years    Types: Cigarettes    Quit date: 02/13/2015  . Smokeless tobacco: Never Used  . Alcohol Use: No     Comment: social  . Drug Use: No  . Sexual Activity: Yes    Birth Control/ Protection: None   Other Topics Concern  . Not on file   Social History Narrative   The PMH, PSH, Social History, Family History, Medications, and allergies have been reviewed in [facility], and have been updated if relevant.  Review of Systems  Constitutional: Positive for unexpected weight change.  Eyes: Negative.   Respiratory: Negative.   Cardiovascular: Negative.   Gastrointestinal: Negative.   Endocrine: Negative.   Genitourinary: Negative.   Musculoskeletal: Negative.        +RLS  Skin: Negative.   Neurological: Negative.   Psychiatric/Behavioral: Negative for suicidal ideas, behavioral problems, confusion, sleep disturbance, self-injury, dysphoric mood, decreased concentration and agitation. The patient is nervous/anxious.   All other systems reviewed and are negative.      Objective:    BP 116/74 mmHg  Pulse 86  Temp(Src) 97.6 F (36.4 C) (Oral)  Ht 5' 6.75" (1.695 m)  Wt 253 lb (114.76 kg)  BMI 39.94 kg/m2  SpO2 98%  LMP 12/04/2015  Breastfeeding? No   Physical Exam  Constitutional: She is oriented to person, place, and time. She appears well-developed and well-nourished.  obese  HENT:  Head: Normocephalic.  Eyes: Conjunctivae are normal.  Cardiovascular: Normal rate.   Pulmonary/Chest: Effort normal.  Musculoskeletal: Normal range of motion.  Neurological: She is alert and oriented to person, place, and time. No cranial nerve deficit.  Skin: Skin is dry.  Psychiatric: She has a normal mood and affect. Her behavior is normal. Judgment and thought content normal.  Nursing note and vitals reviewed.         Assessment & Plan:   Well woman exam - Plan: CBC with Differential/Platelet, Comprehensive  metabolic panel, Lipid panel, TSH  Essential hypertension  Nonischemic congestive cardiomyopathy (HCC)  RLS (restless legs syndrome) - Plan: Vitamin B12  Anxiety state No Follow-up on file.

## 2015-12-19 NOTE — Assessment & Plan Note (Signed)
Well controlled. No changes made today. 

## 2015-12-19 NOTE — Patient Instructions (Addendum)
Great to see you. We will call you with your lab results.  Please keep me updated.   

## 2016-01-03 ENCOUNTER — Ambulatory Visit (HOSPITAL_COMMUNITY): Payer: 59 | Attending: Cardiovascular Disease

## 2016-01-03 ENCOUNTER — Other Ambulatory Visit: Payer: Self-pay

## 2016-01-03 DIAGNOSIS — I340 Nonrheumatic mitral (valve) insufficiency: Secondary | ICD-10-CM | POA: Diagnosis not present

## 2016-01-03 DIAGNOSIS — I509 Heart failure, unspecified: Secondary | ICD-10-CM | POA: Diagnosis not present

## 2016-01-03 DIAGNOSIS — Z87891 Personal history of nicotine dependence: Secondary | ICD-10-CM | POA: Insufficient documentation

## 2016-01-03 DIAGNOSIS — R000 Tachycardia, unspecified: Secondary | ICD-10-CM | POA: Diagnosis not present

## 2016-01-03 DIAGNOSIS — I110 Hypertensive heart disease with heart failure: Secondary | ICD-10-CM | POA: Diagnosis not present

## 2016-01-03 DIAGNOSIS — I429 Cardiomyopathy, unspecified: Secondary | ICD-10-CM | POA: Insufficient documentation

## 2016-01-03 DIAGNOSIS — E669 Obesity, unspecified: Secondary | ICD-10-CM | POA: Diagnosis not present

## 2016-01-03 DIAGNOSIS — Z6839 Body mass index (BMI) 39.0-39.9, adult: Secondary | ICD-10-CM | POA: Diagnosis not present

## 2016-01-03 DIAGNOSIS — I420 Dilated cardiomyopathy: Secondary | ICD-10-CM

## 2016-01-03 DIAGNOSIS — Z01419 Encounter for gynecological examination (general) (routine) without abnormal findings: Secondary | ICD-10-CM | POA: Diagnosis not present

## 2016-01-03 DIAGNOSIS — R002 Palpitations: Secondary | ICD-10-CM | POA: Diagnosis not present

## 2016-01-03 DIAGNOSIS — I10 Essential (primary) hypertension: Secondary | ICD-10-CM

## 2016-01-03 DIAGNOSIS — Z6841 Body Mass Index (BMI) 40.0 and over, adult: Secondary | ICD-10-CM | POA: Diagnosis not present

## 2016-01-03 LAB — ECHOCARDIOGRAM COMPLETE
E decel time: 222 msec
E/e' ratio: 8.98
FS: 28 % (ref 28–44)
IVS/LV PW RATIO, ED: 0.98
LA ID, A-P, ES: 42 mm
LA diam end sys: 42 mm
LA diam index: 1.77 cm/m2
LA vol A4C: 47.5 ml
LA vol index: 19.9 mL/m2
LA vol: 47.3 mL
LV E/e' medial: 8.98
LV E/e'average: 8.98
LV PW d: 7.67 mm — AB (ref 0.6–1.1)
LV e' LATERAL: 10.8 cm/s
LVOT SV: 74 mL
LVOT VTI: 17.9 cm
LVOT area: 4.15 cm2
LVOT diameter: 23 mm
LVOT peak vel: 90.2 cm/s
Lateral S' vel: 13.3 cm/s
MV Dec: 222
MV Peak grad: 4 mmHg
MV pk A vel: 71.3 m/s
MV pk E vel: 97 m/s
TAPSE: 17.5 mm
TDI e' lateral: 10.8
TDI e' medial: 10.1

## 2016-01-06 ENCOUNTER — Other Ambulatory Visit: Payer: Self-pay | Admitting: Physician Assistant

## 2016-01-06 DIAGNOSIS — I429 Cardiomyopathy, unspecified: Secondary | ICD-10-CM

## 2016-01-06 DIAGNOSIS — I10 Essential (primary) hypertension: Secondary | ICD-10-CM

## 2016-01-06 DIAGNOSIS — I420 Dilated cardiomyopathy: Secondary | ICD-10-CM

## 2016-01-06 MED ORDER — ALBUTEROL SULFATE HFA 108 (90 BASE) MCG/ACT IN AERS
2.0000 | INHALATION_SPRAY | Freq: Four times a day (QID) | RESPIRATORY_TRACT | Status: DC | PRN
Start: 2016-01-06 — End: 2016-05-08

## 2016-01-06 MED ORDER — AZITHROMYCIN 250 MG PO TABS: ORAL_TABLET | ORAL | Status: DC

## 2016-01-17 MED FILL — VENTOLIN HFA 90 MCG INHALER: 108 (90 BAS | 25 days supply | Qty: 18 | Fill #0

## 2016-01-17 MED FILL — AZITHROMYCIN 250 MG TABLET: 250 | 5 days supply | Qty: 6 | Fill #0

## 2016-02-09 ENCOUNTER — Ambulatory Visit: Payer: 59 | Admitting: Skilled Nursing Facility1

## 2016-02-29 ENCOUNTER — Encounter: Payer: Self-pay | Admitting: Cardiology

## 2016-03-14 ENCOUNTER — Encounter: Payer: Self-pay | Admitting: Cardiology

## 2016-03-14 ENCOUNTER — Ambulatory Visit (INDEPENDENT_AMBULATORY_CARE_PROVIDER_SITE_OTHER): Payer: 59 | Admitting: Cardiology

## 2016-03-14 VITALS — BP 120/70 | Ht 66.75 in | Wt 258.0 lb

## 2016-03-14 DIAGNOSIS — I5023 Acute on chronic systolic (congestive) heart failure: Secondary | ICD-10-CM | POA: Diagnosis not present

## 2016-03-14 DIAGNOSIS — I10 Essential (primary) hypertension: Secondary | ICD-10-CM

## 2016-03-14 DIAGNOSIS — I509 Heart failure, unspecified: Secondary | ICD-10-CM | POA: Diagnosis not present

## 2016-03-14 DIAGNOSIS — I5021 Acute systolic (congestive) heart failure: Secondary | ICD-10-CM | POA: Diagnosis not present

## 2016-03-14 DIAGNOSIS — R600 Localized edema: Secondary | ICD-10-CM

## 2016-03-14 DIAGNOSIS — I429 Cardiomyopathy, unspecified: Secondary | ICD-10-CM

## 2016-03-14 DIAGNOSIS — R000 Tachycardia, unspecified: Secondary | ICD-10-CM | POA: Diagnosis not present

## 2016-03-14 LAB — COMPREHENSIVE METABOLIC PANEL
ALT: 13 U/L (ref 6–29)
AST: 12 U/L (ref 10–30)
Albumin: 3.8 g/dL (ref 3.6–5.1)
Alkaline Phosphatase: 57 U/L (ref 33–115)
BUN: 11 mg/dL (ref 7–25)
CO2: 21 mmol/L (ref 20–31)
Calcium: 8.8 mg/dL (ref 8.6–10.2)
Chloride: 108 mmol/L (ref 98–110)
Creat: 0.69 mg/dL (ref 0.50–1.10)
Glucose, Bld: 107 mg/dL — ABNORMAL HIGH (ref 65–99)
Potassium: 3.8 mmol/L (ref 3.5–5.3)
Sodium: 138 mmol/L (ref 135–146)
Total Bilirubin: 0.4 mg/dL (ref 0.2–1.2)
Total Protein: 6.6 g/dL (ref 6.1–8.1)

## 2016-03-14 LAB — CBC WITH DIFFERENTIAL/PLATELET
Basophils Absolute: 0 cells/uL (ref 0–200)
Basophils Relative: 0 %
Eosinophils Absolute: 325 cells/uL (ref 15–500)
Eosinophils Relative: 5 %
HCT: 38 % (ref 35.0–45.0)
Hemoglobin: 12.4 g/dL (ref 11.7–15.5)
Lymphocytes Relative: 31 %
Lymphs Abs: 2015 cells/uL (ref 850–3900)
MCH: 27.4 pg (ref 27.0–33.0)
MCHC: 32.6 g/dL (ref 32.0–36.0)
MCV: 83.9 fL (ref 80.0–100.0)
MPV: 10.7 fL (ref 7.5–12.5)
Monocytes Absolute: 260 cells/uL (ref 200–950)
Monocytes Relative: 4 %
Neutro Abs: 3900 cells/uL (ref 1500–7800)
Neutrophils Relative %: 60 %
Platelets: 260 10*3/uL (ref 140–400)
RBC: 4.53 MIL/uL (ref 3.80–5.10)
RDW: 14.2 % (ref 11.0–15.0)
WBC: 6.5 10*3/uL (ref 3.8–10.8)

## 2016-03-14 LAB — MAGNESIUM: Magnesium: 2.1 mg/dL (ref 1.5–2.5)

## 2016-03-14 LAB — TSH: TSH: 0.97 mIU/L

## 2016-03-14 MED ORDER — FUROSEMIDE 40 MG PO TABS: 40.0000 mg | ORAL_TABLET | Freq: Every day | ORAL | 3 refills | Status: DC

## 2016-03-14 NOTE — Patient Instructions (Signed)
Medication Instructions:   DECREASE YOUR LASIX TO 40 MG ONCE DAILY    Labwork:  TODAY--CMET, TSH, CBC W DIFF, AND MAGNESIUM LEVEL     Follow-Up:  2 MONTHS WITH DR Meda Coffee OR AT HER VERY NEXT AVAILABLE APPOINTMENT CLOSE TO THAT TIME FRAME       If you need a refill on your cardiac medications before your next appointment, please call your pharmacy.

## 2016-03-14 NOTE — Progress Notes (Signed)
Patient ID: Michelle ROTHSCHILD, female   DOB: [birth year], 30 y.o.   MRN: [id number]      Cardiology Office Note Date:  03/14/2016  Patient ID:  Michelle Kane, Michelle Kane 1985/10/24, MRN [id number] PCP:  Arnette Norris, MD  Cardiologist:  Dr. Meda Coffee   Chief Complaint: Follow-up for cardiomyopathy  History of Present Illness: Michelle Kane is a 30 y.o. female with history of cardiomyopathy (dx during pregnancy, also felt possibly familial), GERD, allergy-induced asthma, sinus tachycardia who presents for f/u.   She was first evaluated by cardiology in 12/2014 in her 17th week of pregnancy complaining of palpitations, CP, SOB. Echo 01/12/15 showed mildly dilated LV, EF 45-50%, trivial MR. TSH WNL. Holter 12/2014: persistent sinus tach; was started on Toprol. She developed hypertension and was later switched to labetolol. F/u echo 02/2015 showed EF 55%. Repeat Holter 03/2015 showed inappropriate sinus tach for 14 hours each day and labetalol was increased. She was admitted 05/2015 to [facility] with acute CHF, orthopnea, LEE, weight gain. She was treated with diuretics that gave her some relief but she was transferred to [facility] given high risk on 05/15/2015 and induced to labor. She eventually delivered a healthy baby boy [name] at 34th week of pregnancy. At f/u appt 05/22/15 she was started on oral Lasix daily. LE venous duplex 05/23/15 was negative for DVT. At f/u appointment 06/06/15, she was reporting 12lb weight gain in 3 weeks. Repeat echo 06/06/15 showed mildly dilated LV, EF 50-55% although Dr. Francesca Oman office note from that day indicates EF was 40-45%. She was given IV Lasix in the office and Lasix was increased to 40mg  BID. Labs were unremarkable except mild anemia Hgb 10.1 and BNP surprisingly normal. At one point it appears labetalol was changed to carvedilol and lisinopril was added. She was seen in the ER twice the week of Christmas with ongoing DOE without orthopnea, PND or LE edema. She was also reporting  weight gain and night sweats. She had been prescribed inhalers but did not pick them up due to cost. Her symptoms were not felt typical for CHF as her BNP was only 8 and CXR 07/09/15 was normal, along with negative d-dimer. Dr. Meda Coffee saw her in f/u 07/12/15 and suspected her problem was now not cardiac but instead pulm/ENT. TSH WNL, Hgb 11.2 (stable), WBc 8.6 (down from 11.4 in the ER), BMET unremarkable. Dr. Meda Coffee prescribed albuterol, azithromycin, and steroid taper, and referred the patient to pulmonology. F/u echo 07/13/15: EF 50%, no RWMA, normal diastolic parameters. Of note, she had had two prior pregnancies (ages 52, 38) with similar symptoms and no complications. She has a FH of premature CAD, grandfather had MI, CABG in his 17s, GGF died of CHF. Her bother and uncle also have dilated cardiomyopathy.   10/09/2015 - she comes for follow-up, she has been through significant stress, with her baby that is now 2 months old going to the ER with respiratory distress being diagnosed with RSV and influenza pneumonia requiring ventilation for 2 weeks. The baby has recovered air is now at home. The patient herself feels okay she is able to found a job for the [employer]. She denies any chest pain, shortness of breath, orthopnea, paroxysmal nocturnal dyspnea no syncope. She is compliant to her meds. Her losartan is still on hold. She is not breast-feeding anymore.   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 Her son is doing great he just  turned 7 months today. The patient started to exercise and has lost 12 pounds but would have expected to loose more by now, and is advised to go and visit with the nutritionist. She is considering that if this approach fails she'll consider gastric bypass. She denies any lower extremity edema, occasional paroxysmal nocturnal dyspnea, no palpitations or syncope. She is compliant with her  medicines.  03/13/2016 - the patient is coming after 3 months, in the meantime she was given gentamicin for acute upper respiratory infection with improvement. She overall feels exhausted between 3 children full-time job. Denies any chest pain, she is dealing with migraine headaches and tingling pain in her right upper extremity. She has been also experiencing cramps in her lower extremities. No orthopnea or proximal nocturnal dyspnea no lower extremity edema.  Past Medical History:  Diagnosis Date  . Allergy-induced asthma   . Chronic systolic CHF (congestive heart failure) ([place])   . Essential hypertension   . GERD (gastroesophageal reflux disease)   . 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 History of colon polyps   . History of ovarian cyst   . Pelvic pain in female   . Peripartum cardiomyopathy    a. EF 40-45% in 2016, varying by several echoes. F/u echo 06/2015 showed EF 50%.   . Sinus tachycardia ([place])    a. 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 b. 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    Past Surgical History:  Procedure Laterality Date  . CESAREAN SECTION  03-01-2008  . IM PINNING RIGHT ELBOW FX  1992   HARDWARE REMOVED  . LAPAROSCOPY N/A 07/29/2014   Procedure: LAPAROSCOPY DIAGNOSTIC;  Surgeon: Cyril Mourning, MD;  Location: [facility];  Service: Gynecology;  Laterality: N/A;    Current Outpatient Prescriptions  Medication Sig Dispense Refill  . albuterol (PROVENTIL HFA;VENTOLIN HFA) 108 (90 Base) MCG/ACT inhaler Inhale 2 puffs into the lungs every 6 (six) hours as needed for wheezing or shortness of breath. 1 Inhaler 3  . diazepam (VALIUM) 2 MG tablet Take 1 tablet (2 mg total) by mouth every 12 (twelve) hours as needed for anxiety. 30 tablet 0  . famotidine (PEPCID) 20 MG tablet One at bedtime 30 tablet 11  . furosemide (LASIX) 40 MG tablet Take 1 tablet (40 mg total) by mouth 2 (two) times daily. 60  tablet 11  . losartan (COZAAR) 25 MG tablet Take 0.5 tablets (12.5 mg total) by mouth at bedtime. 15 tablet 11  . metoprolol tartrate (LOPRESSOR) 25 MG tablet Take 1 tablet (25 mg total) by mouth daily. 90 tablet 2  . pantoprazole (PROTONIX) 40 MG tablet Take 1 tablet (40 mg total) by mouth daily. Take 30-60 min before first meal of the day 30 tablet 11   No current facility-administered medications for this visit.     Allergies:   Vicodin [hydrocodone-acetaminophen]   Social History:  The patient  reports that she quit smoking about 12 months ago. Her smoking use included Cigarettes. She has a 5.00 pack-year smoking history. She has never used smokeless tobacco. She reports that she does not drink alcohol or use drugs.   Family History:  The patient's family history includes Asthma in her mother; COPD in her father and maternal grandmother; Colon polyps in her mother; Crohn's disease in her mother; Diabetes in her paternal grandfather; Heart attack in her maternal grandfather; Heart failure in her brother, paternal aunt, and paternal uncle; Hemophilia in her  father and son; Hyperlipidemia in her father; Lung cancer in her maternal grandmother; Stroke in her paternal grandfather.   ROS:  Please see the history of present illness.  All other systems are reviewed and otherwise negative.   PHYSICAL EXAM:  VS:  BP 120/70   Ht 5' 6.75" (1.695 m)   Wt 258 lb (117 kg)   BMI 40.71 kg/m  BMI: Body mass index is 40.71 kg/m. Well nourished, well developed obese WF in no acute distress HEENT: normocephalic, atraumatic Neck: no JVD, carotid bruits or masses Cardiac:  normal S1, S2; RRR with borderline elevated rate; no murmurs, rubs, or gallops Lungs:  clear to auscultation bilaterally, no wheezing, rhonchi or rales Abd: soft, nontender, no hepatomegaly, + BS MS: no deformity or atrophy Ext: no edema Skin: warm and dry, no rash Neuro:  moves all extremities spontaneously, no focal abnormalities  noted, follows commands Psych: euthymic mood, full affect  EKG: not done today  Recent Labs: 07/09/2015: B Natriuretic Peptide 7.9 12/19/2015: ALT 16; BUN 12; Creatinine, Ser 0.66; Hemoglobin 12.6; Platelets 241.0; Potassium 3.7; Sodium 139; TSH 0.97  12/19/2015: Cholesterol 190; HDL 36.20; LDL Cholesterol 115; Total CHOL/HDL Ratio 5; Triglycerides 194.0; VLDL 38.8   CrCl cannot be calculated (Patient's most recent lab result is older than the maximum 21 days allowed.).   Wt Readings from Last 3 Encounters:  03/14/16 258 lb (117 kg)  12/19/15 253 lb (114.8 kg)  12/13/15 253 lb 12.8 oz (115.1 kg)     Other studies reviewed: Additional studies/records reviewed today include: summarized above  TTE: 06/2015 Left ventricle: The cavity size was normal. Systolic function was  mildly reduced. The estimated ejection fraction was 50%. Wall  motion was normal; there were no regional wall motion  abnormalities. Left ventricular diastolic function parameters  were normal. - Aortic valve: Trileaflet; normal thickness leaflets. There was no  regurgitation. - Aortic root: The aortic root was normal in size. - Mitral valve: Structurally normal valve. Transvalvular velocity  was within the normal range. There was no evidence for stenosis.  There was trivial regurgitation. - Right ventricle: The cavity size was normal. Wall thickness was  normal. Systolic function was normal. - Tricuspid valve: There was mild regurgitation. - Pulmonary arteries: Systolic pressure was within the normal  range. - Inferior vena cava: The vessel was normal in size. - Pericardium, extracardiac: There was no pericardial effusion.  Impressions:  - Compared to the prior study from 06/06/2015 LVEF is now mildly  improved estomated at 50% with mild global hypokinesis,  previously 40-45%.  Global longitudinal strain is -18%. Lateral S prima is 12 cm/sec.   ASSESSMENT AND PLAN:  1. Nonischemic dilated  cardiomyopathy- with most recent LVEF 50%, continue losartan and metoprolol.  2. Acute on chronic systolic CHF - the patient appears euvolemic today, NYHA class II a, however significant cramping, we will check her electrolytes including magnesium today and decrease furosemide to 40 mg daily. We will further adjust her medications once we have results of her tests.  3. Hypertension - well controlled continue the same regimen. 4. Sinus tachycardia - controlled on metoprolol. 5. Obesity - she strongly advised to try diet and exercise for now she has already lost some weight considering her underlying heart problem I would try to stay away from gastric bypass surgery.  Disposition: Follow-up in 3 months.  Signed, Ena Dawley, MD 03/14/2016 8:55 AM     [place] [place] [address] [phone] (  office)  [phone] (fax)

## 2016-03-15 ENCOUNTER — Other Ambulatory Visit: Payer: Self-pay | Admitting: *Deleted

## 2016-03-15 ENCOUNTER — Other Ambulatory Visit: Payer: Self-pay | Admitting: Cardiology

## 2016-03-15 MED ORDER — METOPROLOL TARTRATE 25 MG PO TABS: 25.0000 mg | ORAL_TABLET | Freq: Every day | ORAL | 3 refills | Status: DC

## 2016-03-15 NOTE — Telephone Encounter (Signed)
Pt is to take metoprolol tartrate 25 mg po daily, not succinate.  Can you please call them back and inform them of this? Thanks so much!

## 2016-03-15 NOTE — Telephone Encounter (Signed)
Pharmacy/patient requesting a refill on toprol-xl. Looks like the patient is supposed to be taking lopressor. Please advise. Thanks, MI

## 2016-04-03 ENCOUNTER — Telehealth: Payer: Self-pay | Admitting: Cardiology

## 2016-04-03 NOTE — Telephone Encounter (Signed)
Pt c/o lightheadedness and feeling like she was going to pass out yesterday.  Today still lightheaded and have CP that she states is the same as the pain she had last year when she was having palpitations. States this pain shoots to her back.  BP 116/66, HR 122. Pt states that she can feel she is tachycardic.  C/O SOB and is audibly SOB on the phone.  Pt increased her Lasix back to 80mg  QD about a week and a half after her last visit due to SOB increasing.  Spoke with Dr. Meda Coffee and she said to have pt come in for appt, increase fluids and go to ER if symptoms worsen.  Spoke with pt and scheduled her to see Dr. Meda Coffee 9/22 at [facility] and adivsed her of recommendations.  Pt verbalized understanding and was in agreement with this plan.  While on the phone pt mentioned that she rechecked her HR and it is now 138.  Dr. Meda Coffee advised of this and recommended pt go to ER for eval.  Advised pt to go to ER and to keep appt for Friday.  Pt verbalized understanding and was in agreement. Will route to Dr. Meda Coffee and her nurse to make them aware.

## 2016-04-03 NOTE — Telephone Encounter (Signed)
New Message  Patient c/o Palpitations:  High priority if patient c/o lightheadedness and shortness of breath.  1. How long have you been having palpitations? Past Two days  2. Are you currently experiencing lightheadedness and shortness of breath? Lightheadedness & SOB-On and off  3. Have you checked your BP and heart rate? (document readings) 116/66 twenty minutes ago HR-122  4. Are you experiencing any other symptoms? Pain through chest and will go through her back and pt voiced she's having an techno cardio heart rate which usually causes her to have palpitations.  Please f/u with pt.

## 2016-04-05 ENCOUNTER — Ambulatory Visit (INDEPENDENT_AMBULATORY_CARE_PROVIDER_SITE_OTHER): Payer: 59 | Admitting: Cardiology

## 2016-04-05 ENCOUNTER — Encounter: Payer: Self-pay | Admitting: Cardiology

## 2016-04-05 VITALS — BP 122/82 | HR 82 | Ht 66.75 in | Wt 258.2 lb

## 2016-04-05 DIAGNOSIS — R000 Tachycardia, unspecified: Secondary | ICD-10-CM | POA: Diagnosis not present

## 2016-04-05 DIAGNOSIS — I5023 Acute on chronic systolic (congestive) heart failure: Secondary | ICD-10-CM | POA: Diagnosis not present

## 2016-04-05 DIAGNOSIS — R072 Precordial pain: Secondary | ICD-10-CM

## 2016-04-05 DIAGNOSIS — I10 Essential (primary) hypertension: Secondary | ICD-10-CM

## 2016-04-05 DIAGNOSIS — R002 Palpitations: Secondary | ICD-10-CM | POA: Diagnosis not present

## 2016-04-05 MED ORDER — METOPROLOL TARTRATE 50 MG PO TABS: 50.0000 mg | ORAL_TABLET | Freq: Two times a day (BID) | ORAL | 3 refills | Status: DC

## 2016-04-05 NOTE — Progress Notes (Addendum)
Patient ID: Michelle Kane, female   DOB: 09-07-1985, 30 y.o.   MRN: [id number]      Cardiology Office Note Date:  04/05/2016  Patient ID:  Michelle Kane, Michelle Kane 1986-01-22, MRN [id number] PCP:  Arnette Norris, MD  Cardiologist:  Dr. Meda Coffee   Chief Complaint: Follow-up for cardiomyopathy  History of Present Illness: Michelle Kane is a 30 y.o. female with history of cardiomyopathy (dx during pregnancy, also felt possibly familial), GERD, allergy-induced asthma, sinus tachycardia who presents for f/u.   She was first evaluated by cardiology in 12/2014 in her 17th week of pregnancy complaining of palpitations, CP, SOB. Echo 01/12/15 showed mildly dilated LV, EF 45-50%, trivial MR. TSH WNL. Holter 12/2014: persistent sinus tach; was started on Toprol. She developed hypertension and was later switched to labetolol. F/u echo 02/2015 showed EF 55%. Repeat Holter 03/2015 showed inappropriate sinus tach for 14 hours each day and labetalol was increased. She was admitted 05/2015 to [facility] with acute CHF, orthopnea, LEE, weight gain. She was treated with diuretics that gave her some relief but she was transferred to [facility] given high risk on 05/15/2015 and induced to labor. She eventually delivered a healthy baby boy [name] at 34th week of pregnancy. At f/u appt 05/22/15 she was started on oral Lasix daily. LE venous duplex 05/23/15 was negative for DVT. At f/u appointment 06/06/15, she was reporting 12lb weight gain in 3 weeks. Repeat echo 06/06/15 showed mildly dilated LV, EF 50-55% although Dr. Francesca Oman office note from that day indicates EF was 40-45%. She was given IV Lasix in the office and Lasix was increased to 40mg  BID. Labs were unremarkable except mild anemia Hgb 10.1 and BNP surprisingly normal. At one point it appears labetalol was changed to carvedilol and lisinopril was added. She was seen in the ER twice the week of Christmas with ongoing DOE without orthopnea, PND or LE edema. She was also reporting  weight gain and night sweats. She had been prescribed inhalers but did not pick them up due to cost. Her symptoms were not felt typical for CHF as her BNP was only 8 and CXR 07/09/15 was normal, along with negative d-dimer. Dr. Meda Coffee saw her in f/u 07/12/15 and suspected her problem was now not cardiac but instead pulm/ENT. TSH WNL, Hgb 11.2 (stable), WBc 8.6 (down from 11.4 in the ER), BMET unremarkable. Dr. Meda Coffee prescribed albuterol, azithromycin, and steroid taper, and referred the patient to pulmonology. F/u echo 07/13/15: EF 50%, no RWMA, normal diastolic parameters. Of note, she had had two prior pregnancies (ages 65, 55) with similar symptoms and no complications. She has a FH of premature CAD, grandfather had MI, CABG in his 72s, GGF died of CHF. Her bother and uncle also have dilated cardiomyopathy.   10/09/2015 - she comes for follow-up, she has been through significant stress, with her baby that is now 55 months old going to the ER with respiratory distress being diagnosed with RSV and influenza pneumonia requiring ventilation for 2 weeks. The baby has recovered air is now at home. The patient herself feels okay she is able to found a job for the [employer]. She denies any chest pain, shortness of breath, orthopnea, paroxysmal nocturnal dyspnea no syncope. She is compliant to her meds. Her losartan is still on hold. She is not breast-feeding anymore.   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 Her son is doing great he just  turned 7 months today. The patient started to exercise and has lost 12 pounds but would have expected to loose more by now, and is advised to go and visit with the nutritionist. She is considering that if this approach fails she'll consider gastric bypass. She denies any lower extremity edema, occasional paroxysmal nocturnal dyspnea, no palpitations or syncope. She is compliant with her  medicines.  04/05/2016 - this is an eraly visit as the patient reports palpitations, dizziness and chest pain. She states that this is how she felt when she was pregnant with her son. She has been working with some difficulties, for 4 days trying to take probably 6 x 2 days ago while at work she felt dizzy palpitations and her heart rate was anywhere from 08/04/1938, her blood pressure was normal. She denies any syncope lower extremity edema orthopnea dyspnea, she has been compliant with her meds. Her palpitations every day. And would last for minutes. They're usually happen on exertion but can happen at rest and resolve slowly.  Past Medical History:  Diagnosis Date  . Allergy-induced asthma   . Chronic systolic CHF (congestive heart failure) ([place])   . Essential hypertension   . GERD (gastroesophageal reflux disease)   . 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 History of colon polyps   . History of ovarian cyst   . Pelvic pain in female   . Peripartum cardiomyopathy    a. EF 40-45% in 2016, varying by several echoes. F/u echo 06/2015 showed EF 50%.   . Sinus tachycardia ([place])    a. 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 b. 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    Past Surgical History:  Procedure Laterality Date  . CESAREAN SECTION  03-01-2008  . IM PINNING RIGHT ELBOW FX  1992   HARDWARE REMOVED  . LAPAROSCOPY N/A 07/29/2014   Procedure: LAPAROSCOPY DIAGNOSTIC;  Surgeon: Cyril Mourning, MD;  Location: [facility];  Service: Gynecology;  Laterality: N/A;    Current Outpatient Prescriptions  Medication Sig Dispense Refill  . albuterol (PROVENTIL HFA;VENTOLIN HFA) 108 (90 Base) MCG/ACT inhaler Inhale 2 puffs into the lungs every 6 (six) hours as needed for wheezing or shortness of breath. 1 Inhaler 3  . diazepam (VALIUM) 2 MG tablet Take 1 tablet (2 mg total) by mouth every 12 (twelve) hours as needed for anxiety. 30  tablet 0  . famotidine (PEPCID) 20 MG tablet Take 20 mg by mouth at bedtime.    . furosemide (LASIX) 40 MG tablet Take 1 tablet (40 mg total) by mouth daily. 90 tablet 3  . losartan (COZAAR) 25 MG tablet Take 0.5 tablets (12.5 mg total) by mouth at bedtime. 15 tablet 11  . metoprolol tartrate (LOPRESSOR) 25 MG tablet Take 1 tablet (25 mg total) by mouth daily. 90 tablet 3  . pantoprazole (PROTONIX) 40 MG tablet Take 1 tablet (40 mg total) by mouth daily. Take 30-60 min before first meal of the day 30 tablet 11   No current facility-administered medications for this visit.     Allergies:   Vicodin [hydrocodone-acetaminophen]   Social History:  The patient  reports that she quit smoking about 13 months ago. Her smoking use included Cigarettes. She has a 5.00 pack-year smoking history. She has never used smokeless tobacco. She reports that she does not drink alcohol or use drugs.   Family History:  The patient's family history includes Asthma in her mother; COPD in her  father and maternal grandmother; Colon polyps in her mother; Crohn's disease in her mother; Diabetes in her paternal grandfather; Heart attack in her maternal grandfather; Heart failure in her brother, paternal aunt, and paternal uncle; Hemophilia in her father and son; Hyperlipidemia in her father; Lung cancer in her maternal grandmother; Stroke in her paternal grandfather.   ROS:  Please see the history of present illness.  All other systems are reviewed and otherwise negative.   PHYSICAL EXAM:  VS:  BP 122/82   Pulse 82   Ht 5' 6.75" (1.695 m)   Wt 258 lb 3.2 oz (117.1 kg)   SpO2 98%   BMI 40.74 kg/m  BMI: Body mass index is 40.74 kg/m. Well nourished, well developed obese WF in no acute distress  HEENT: normocephalic, atraumatic  Neck: no JVD, carotid bruits or masses Cardiac:  normal S1, S2; RRR with borderline elevated rate; no murmurs, rubs, or gallops Lungs:  clear to auscultation bilaterally, no wheezing, rhonchi  or rales  Abd: soft, nontender, no hepatomegaly, + BS MS: no deformity or atrophy Ext: no edema  Skin: warm and dry, no rash Neuro:  moves all extremities spontaneously, no focal abnormalities noted, follows commands Psych: euthymic mood, full affect  EKG: not done today  Recent Labs: 07/09/2015: B Natriuretic Peptide 7.9 03/14/2016: ALT 13; BUN 11; Creat 0.69; Hemoglobin 12.4; Magnesium 2.1; Platelets 260; Potassium 3.8; Sodium 138; TSH 0.97  12/19/2015: Cholesterol 190; HDL 36.20; LDL Cholesterol 115; Total CHOL/HDL Ratio 5; Triglycerides 194.0; VLDL 38.8   CrCl cannot be calculated (Patient's most recent lab result is older than the maximum 21 days allowed.).   Wt Readings from Last 3 Encounters:  04/05/16 258 lb 3.2 oz (117.1 kg)  03/14/16 258 lb (117 kg)  12/19/15 253 lb (114.8 kg)     Other studies reviewed: Additional studies/records reviewed today include: summarized above  TTE: 06/2015 Left ventricle: The cavity size was normal. Systolic function was  mildly reduced. The estimated ejection fraction was 50%. Wall  motion was normal; there were no regional wall motion  abnormalities. Left ventricular diastolic function parameters  were normal. - Aortic valve: Trileaflet; normal thickness leaflets. There was no  regurgitation. - Aortic root: The aortic root was normal in size. - Mitral valve: Structurally normal valve. Transvalvular velocity  was within the normal range. There was no evidence for stenosis.  There was trivial regurgitation. - Right ventricle: The cavity size was normal. Wall thickness was  normal. Systolic function was normal. - Tricuspid valve: There was mild regurgitation. - Pulmonary arteries: Systolic pressure was within the normal  range. - Inferior vena cava: The vessel was normal in size. - Pericardium, extracardiac: There was no pericardial effusion.  Impressions:  - Compared to the prior study from 06/06/2015 LVEF is now mildly   improved estomated at 50% with mild global hypokinesis,  previously 40-45%.  Global longitudinal strain is -18%. Lateral S prima is 12 cm/sec.   ASSESSMENT AND PLAN:  1. Palpitations associated with chest pain - I would increase metoprolol to 50 mg by mouth twice a day and start 48 hour Holter monitor. 2. Nonischemic dilated cardiomyopathy- with most recent LVEF 50%, continue losartan and increase metoprolol.  3. Acute on chronic systolic CHF - the patient appears euvolemic today, NYHA class II. 4. Hypertension - well controlled continue the same regimen. 5. Obesity - she strongly advised to try diet and exercise for now she has already lost some weight considering her underlying heart problem I would  try to stay away from gastric bypass surgery.  If her Holter monitor doesn't reveal any abnormalities I will repeat her echocardiogram.  Disposition: Follow-up in 3 months.  Signed, Ena Dawley, MD 04/05/2016 10:28 AM     [place] [address] [phone] (office)  [phone] (fax)

## 2016-04-05 NOTE — Patient Instructions (Signed)
Medication Instructions:   INCREASE YOUR METOPROLOL TARTRATE TO 50 MG BY MOUTH TWICE DAILY    Testing/Procedures:  Your physician has recommended that you wear a 48 HOUR holter monitor. Holter monitors are medical devices that record the heart's electrical activity. Doctors most often use these monitors to diagnose arrhythmias. Arrhythmias are problems with the speed or rhythm of the heartbeat. The monitor is a small, portable device. You can wear one while you do your normal daily activities. This is usually used to diagnose what is causing palpitations/syncope (passing out).    Follow-Up:  3 WEEKS WITH AN EXTENDER ON DR NELSON'S TEAM     If you need a refill on your cardiac medications before your next appointment, please call your pharmacy.

## 2016-04-12 ENCOUNTER — Ambulatory Visit (INDEPENDENT_AMBULATORY_CARE_PROVIDER_SITE_OTHER): Payer: 59

## 2016-04-12 DIAGNOSIS — R002 Palpitations: Secondary | ICD-10-CM

## 2016-04-12 DIAGNOSIS — R072 Precordial pain: Secondary | ICD-10-CM | POA: Diagnosis not present

## 2016-04-26 ENCOUNTER — Ambulatory Visit: Payer: 59 | Admitting: Physician Assistant

## 2016-05-01 ENCOUNTER — Telehealth: Payer: Self-pay | Admitting: Cardiology

## 2016-05-01 ENCOUNTER — Telehealth: Payer: Self-pay | Admitting: *Deleted

## 2016-05-01 MED ORDER — METOPROLOL TARTRATE 50 MG PO TABS: 75.0000 mg | ORAL_TABLET | Freq: Two times a day (BID) | ORAL | 3 refills | Status: DC

## 2016-05-01 NOTE — Telephone Encounter (Signed)
-----   Message from [name], MD sent at 04/30/2016  2:17 PM EDT ----- There are no arrhythmias but frequent sinus tachycardia, I would increase metoprolol to 75 mg po BID.

## 2016-05-01 NOTE — Telephone Encounter (Signed)
Notes Recorded by Nuala Alpha, LPN on [id number] at 8:44 AM EDT Notified the pt that per Dr Meda Coffee, her monitor showed that there are no arrhythmias but frequent sinus tachycardia, and she recommends that we increase metoprolol to 75 mg po BID. Informed the pt that she will need to take the 50 mg tablets, but take 1 1/2 tablets BID, to equal metoprolol 75 mg po BID.  Confirmed the pharmacy of choice with the pt.  Pt verbalized understanding and agrees with this plan. ------  Notes Recorded by Nuala Alpha, LPN on [id number] at 8:04 AM EDT Left a message for the pt to call back to endorse monitor results and recommendations per Dr Meda Coffee. ------  Notes Recorded by [name], MD on 04/30/2016 at 2:17 PM EDT There are no arrhythmias but frequent sinus tachycardia, I would increase metoprolol to 75 mg po BID.

## 2016-05-01 NOTE — Telephone Encounter (Signed)
New message ° ° ° °Pt verbalized that she is returning call for rn  °

## 2016-05-01 NOTE — Telephone Encounter (Signed)
Notified the pt that per Dr Meda Coffee, her monitor showed that there are no arrhythmias but frequent sinus tachycardia, and she recommends that we increase metoprolol to 75 mg po BID. Informed the pt that she will need to take the 50 mg tablets, but take 1 1/2 tablets BID, to equal metoprolol 75 mg po BID.  Confirmed the pharmacy of choice with the pt.  Pt verbalized understanding and agrees with this plan.

## 2016-05-08 ENCOUNTER — Ambulatory Visit (INDEPENDENT_AMBULATORY_CARE_PROVIDER_SITE_OTHER): Payer: 59 | Admitting: Cardiology

## 2016-05-08 ENCOUNTER — Encounter: Payer: Self-pay | Admitting: Cardiology

## 2016-05-08 DIAGNOSIS — I428 Other cardiomyopathies: Secondary | ICD-10-CM

## 2016-05-08 DIAGNOSIS — I10 Essential (primary) hypertension: Secondary | ICD-10-CM | POA: Diagnosis not present

## 2016-05-08 DIAGNOSIS — I429 Cardiomyopathy, unspecified: Secondary | ICD-10-CM

## 2016-05-08 DIAGNOSIS — I420 Dilated cardiomyopathy: Secondary | ICD-10-CM

## 2016-05-08 MED ORDER — FAMOTIDINE 20 MG PO TABS: 20.0000 mg | ORAL_TABLET | Freq: Every day | ORAL | 5 refills | Status: DC

## 2016-05-08 MED ORDER — PANTOPRAZOLE SODIUM 40 MG PO TBEC: 40.0000 mg | DELAYED_RELEASE_TABLET | Freq: Every day | ORAL | 5 refills | Status: DC

## 2016-05-08 MED ORDER — ALBUTEROL SULFATE HFA 108 (90 BASE) MCG/ACT IN AERS: 2.0000 | INHALATION_SPRAY | Freq: Four times a day (QID) | RESPIRATORY_TRACT | 2 refills | Status: DC | PRN

## 2016-05-08 NOTE — Patient Instructions (Signed)
Medication Instructions:  Your physician recommends that you continue on your current medications as directed. Please refer to the Current Medication list given to you today.  Your medications have been refilled today  Labwork: None ordered  Testing/Procedures: None ordered  Follow-Up: Follow up as planned with Dr.Nelson in November 2017  Any Other Special Instructions Will Be Listed Below (If Applicable).     If you need a refill on your cardiac medications before your next appointment, please call your pharmacy.

## 2016-05-08 NOTE — Progress Notes (Signed)
05/08/2016 [name]   01/31/1986  [id number]  Primary Physician Arnette Norris, MD Primary Cardiologist: Dr. Meda Coffee   Reason for Visit/CC: F/u for Tachypalpiations  HPI:  Patient is a 30 year old female, followed by Dr. Meda Coffee, who presents to clinic for follow-up. 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 She has a history of cardiomyopathy during pregnancy, also felt possibly familial, GERD, allergy-induced asthma, and inappropriate sinus tachycardia. EF was as low as 40% in the past. Her most recent echocardiogram 01/03/2016 showed normalization of her ejection fraction back to 50-55%. She has been on BB and ARB therapy.   She was recently seen by Dr. Meda Coffee in September with complaints of recurrent tachypalpitations. Her metoprolol was increased from 25 mg BID to 50 mg BID. Dr. Meda Coffee ordered a 48 hr monitor to assess for arrhthymias.   48 hour monitor showed no arrhythmias but frequent sinus tachycardia. Patient was instructed by Dr. Meda Coffee to further increase her Metroprolol to 75 mg twice a day.  She presents to clinic today for follow-up. EKG demonstrates normal sinus rhythm with a rate of 89 bpm. EKG show shows minimal voltage criteria for LVH. This is unchanged from previous. She has tolerated the increase in metoprolol well. Symptoms have improved. No resting tachycardia. Pulse rate and BP are both stable. No fatigue. She also denies fluid retention. She is compliant with daily weights. No LEE, dyspnea, orthopnea, PND nor abdominal fullness. No chest pain.   Current Meds  Medication Sig  . albuterol (PROVENTIL HFA;VENTOLIN HFA) 108 (90 Base) MCG/ACT inhaler Inhale 2 puffs into the lungs every 6 (six) hours as needed for wheezing or shortness of breath.  . diazepam (VALIUM) 2 MG tablet Take 1 tablet (2 mg total) by mouth every 12 (twelve) hours as needed for anxiety.  . famotidine (PEPCID) 20 MG tablet Take 20 mg by mouth at bedtime.  . furosemide (LASIX) 40 MG tablet Take 1  tablet (40 mg total) by mouth daily.  Marland Kitchen losartan (COZAAR) 25 MG tablet Take 0.5 tablets (12.5 mg total) by mouth at bedtime.  . metoprolol (LOPRESSOR) 50 MG tablet Take 1.5 tablets (75 mg total) by mouth 2 (two) times daily.  . pantoprazole (PROTONIX) 40 MG tablet Take 1 tablet (40 mg total) by mouth daily. Take 30-60 min before first meal of the day   Allergies  Allergen Reactions  . Vicodin [Hydrocodone-Acetaminophen] Itching, Nausea And Vomiting and Other (See Comments)    GI pains also   Past Medical History:  Diagnosis Date  . Allergy-induced asthma   . Chronic systolic CHF (congestive heart failure) ([place])   . Essential hypertension   . GERD (gastroesophageal reflux disease)   . 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 History of colon polyps   . History of ovarian cyst   . Pelvic pain in female   . Peripartum cardiomyopathy    a. EF 40-45% in 2016, varying by several echoes. F/u echo 06/2015 showed EF 50%.   . Sinus tachycardia    a. 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 b. 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   Family History  Problem Relation Age of Onset  . Asthma Mother   . Crohn's disease Mother   . Colon polyps Mother   . Hyperlipidemia Father   . Hemophilia Father   . COPD Father     smoked  . Hemophilia Son   . Heart attack Maternal Grandfather   . Diabetes Paternal  Grandfather   . Stroke Paternal Grandfather   . Heart failure Brother   . Heart failure Paternal Uncle   . Heart failure Paternal Aunt   . COPD Maternal Grandmother     smoked  . Lung cancer Maternal Grandmother     smoked  . Hypertension Neg Hx    Past Surgical History:  Procedure Laterality Date  . CESAREAN SECTION  03-01-2008  . IM PINNING RIGHT ELBOW FX  1992   HARDWARE REMOVED  . LAPAROSCOPY N/A 07/29/2014   Procedure: LAPAROSCOPY DIAGNOSTIC;  Surgeon: Cyril Mourning, MD;  Location: [facility];  Service:  Gynecology;  Laterality: N/A;   Social History   Social History  . Marital status: Significant Other    Spouse name: N/A  . Number of children: N/A  . Years of education: N/A   Occupational History  . CNA     Social History Main Topics  . Smoking status: Former Smoker    Packs/day: 0.50    Years: 10.00    Types: Cigarettes    Quit date: 02/13/2015  . Smokeless tobacco: Never Used  . Alcohol use No     Comment: social  . Drug use: No  . Sexual activity: Yes    Birth control/ protection: None   Other Topics Concern  . Not on file   Social History Narrative  . No narrative on file     Review of Systems: General: negative for chills, fever, night sweats or weight changes.  Cardiovascular: negative for chest pain, dyspnea on exertion, edema, orthopnea, palpitations, paroxysmal nocturnal dyspnea or shortness of breath Dermatological: negative for rash Respiratory: negative for cough or wheezing Urologic: negative for hematuria Abdominal: negative for nausea, vomiting, diarrhea, bright red blood per rectum, melena, or hematemesis Neurologic: negative for visual changes, syncope, or dizziness All other systems reviewed and are otherwise negative except as noted above.   Physical Exam:  Blood pressure 116/64, pulse 89, height 5\' 6"  (1.676 m), weight 253 lb 6.4 oz (114.9 kg).  General appearance: alert, cooperative, no distress and moderately obese Neck: no carotid bruit and no JVD Lungs: clear to auscultation bilaterally Heart: regular rate and rhythm, S1, S2 normal, no murmur, click, rub or gallop Extremities: no LEE Pulses: 2+ and symmetric Skin: warm and dry Neurologic: Grossly normal  EKG NSR. 89 bpm.  ASSESSMENT AND PLAN:   1. Palpitations- Recent 48 hour monitor demonstrated no worrisome cardiac arrhythmias. She was noted to have frequent sinus tachycardia on monitor. Subsequently her Metroprolol was increased from 50 mg twice a day to 75 mg twice a day. She  notes significant improvement with this. No resting symptoms. Heart rate is better controlled. EKG shows sinus rhythm with a pulse rate of 89 bpm. She is tolerating the increased dose of beta blocker well without any side effects. No fatigue. Blood pressure is stable. Continue 75 mg Metroprolol twice a day.  2. 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   3. Chronic systolic CHF - the patient appears euvolemic today, NYHA class II. We discussed continuation of daily weights and low sodium diet. Continue daily Lasix.   4. Hypertension - well controlled. Continue the same regimen.   PLAN  Patient has f/u with Dr. Meda Coffee next month. She plans to keep the appointment.   Lyda Jester PA-C 05/08/2016 9:46 AM

## 2016-05-17 ENCOUNTER — Ambulatory Visit: Payer: 59 | Admitting: Cardiology

## 2016-06-04 ENCOUNTER — Telehealth: Payer: Self-pay | Admitting: *Deleted

## 2016-06-04 DIAGNOSIS — R072 Precordial pain: Secondary | ICD-10-CM

## 2016-06-04 NOTE — Telephone Encounter (Signed)
Verbal orders given per Dr Meda Coffee, for this pt to be added to an Extender schedule for 06/05/16, for chest pain, and order a GXT for tomorrow (if slot available), or next week.  Per Dr Meda Coffee, this pt has been experiencing chest pain.  GXT order placed, and scheduling is working on both appts now. Pt is aware I will call her back with appt date and times.  Pt gracious for all the assistance provided.

## 2016-06-04 NOTE — Telephone Encounter (Signed)
New Message ° °Pt voiced returning nurses call. ° °Please f/u °

## 2016-06-04 NOTE — Telephone Encounter (Signed)
Pts GXT was moved up to tomorrow 11/22 at 0900, prior to seeing Ellen Henri PA-C at 0930.  Pt made aware of scheduling change.  Pt verbalized understanding and agrees with this plan.

## 2016-06-04 NOTE — Telephone Encounter (Signed)
Pt is scheduled to see Ellen Henri PA-C for tomorrow 06/05/16 at 0930.  Pt is aware to arrive 15 mins prior too this appt.  Pts GXT is scheduled for next Thursday 06/13/16 at 3pm.  This was the 1st available, and Ellen Henri PA-C will be in the office that day, if needed by treadmill tech. Went over GXT instructions with the pt over the phone.  Advised her that she must hold her metoprolol the morning of and prior to this test.  Advised her that she may take this after the test is complete.  Informed the pt that I will leave her GXT instructions at the front desk for her to pick up tomorrow, at her appt with [place].  Pt verbalized understanding and agrees with this plan.  Will route this message to [employer] PA-C as an Juluis Rainier.

## 2016-06-05 ENCOUNTER — Ambulatory Visit (INDEPENDENT_AMBULATORY_CARE_PROVIDER_SITE_OTHER): Payer: 59

## 2016-06-05 ENCOUNTER — Encounter: Payer: Self-pay | Admitting: Cardiology

## 2016-06-05 ENCOUNTER — Ambulatory Visit (INDEPENDENT_AMBULATORY_CARE_PROVIDER_SITE_OTHER): Payer: 59 | Admitting: Cardiology

## 2016-06-05 VITALS — BP 118/82 | HR 90 | Ht 66.75 in | Wt 257.0 lb

## 2016-06-05 DIAGNOSIS — R0789 Other chest pain: Secondary | ICD-10-CM

## 2016-06-05 DIAGNOSIS — R072 Precordial pain: Secondary | ICD-10-CM | POA: Diagnosis not present

## 2016-06-05 LAB — EXERCISE TOLERANCE TEST
Estimated workload: 8.5 METS
Exercise duration (min): 7 min
Exercise duration (sec): 0 s
MPHR: 190 {beats}/min
Peak HR: 164 {beats}/min
Percent HR: 86 %
RPE: 16
Rest HR: 86 {beats}/min

## 2016-06-05 NOTE — Progress Notes (Signed)
06/05/2016 [name]   April 07, 1986  [id number]  Primary Physician Arnette Norris, MD Primary Cardiologist: Dr. Meda Coffee    Reason for Visit/CC: Atypical Chest Pain  HPI:  Patient is a 30 year old female, followed by Dr. Meda Coffee, who presents to clinic for follow-up. 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 She has a history of cardiomyopathy during pregnancy, also felt possibly familial, GERD, allergy-induced asthma, and inappropriate sinus tachycardia. EF was as low as 40% in the past. Her most recent echocardiogram 01/03/2016 showed normalization of her ejection fraction back to 50-55%. She has been on BB and ARB therapy.   She was recently seen by Dr. Meda Coffee in September with complaints of recurrent tachypalpitations. Her metoprolol was increased from 25 mg BID to 50 mg BID. Dr. Meda Coffee ordered a 48 hr monitor to assess for arrhthymias.   48 hour monitor showed no arrhythmias but frequent sinus tachycardia. Patient was instructed by Dr. Meda Coffee to further increase her Metroprolol to 75 mg twice a day. She was seen in f/u on 10/25 after her metoprolol was increased. She was doing well at that time without any issues. EKG showed normal sinus rhythm with a rate of 89 bpm. EKG show minimal voltage criteria for LVH. Unchanged from previous.  She presents back today with complaint of atypical chest pain and occasional dyspnea. Feels like a knot in her chest. She notes associated upper lip and nose tingling when this occurs. Not worse with exertion. It is pleuritic but not positional. Not worse with meals. No particular pattern. Can occur at random. Can last 1-2 hrs at a time. She also notes dyspnea walking up stairs. She has some occasional wheezing but has not used albuterol, in fear of rebound tachycardia. At the same time, she feels this is not her asthma. She also does not feel this is  reflux. She is on Protonix. She was seen by Dr. Melvyn Novas in January. She had normal PFTs. She was suppose to f/u with  him 4 weeks later but was not able to make the appointment due to her son being hospitalized. She has not been seen by him since.   Today, she underwent an exercise treadmill test. Max HR was 164. She had no chest pain during test. No ST changes. 1 PVC. BP in clinic today is 118/82. She held all of her meds for stress test. Resting HR is 90. Currently asymptomatic in exam room.     Current Meds  Medication Sig  . albuterol (PROVENTIL HFA;VENTOLIN HFA) 108 (90 Base) MCG/ACT inhaler Inhale 2 puffs into the lungs every 6 (six) hours as needed for wheezing or shortness of breath.  . diazepam (VALIUM) 2 MG tablet Take 1 tablet (2 mg total) by mouth every 12 (twelve) hours as needed for anxiety.  . famotidine (PEPCID) 20 MG tablet Take 1 tablet (20 mg total) by mouth at bedtime.  . furosemide (LASIX) 40 MG tablet Take 1 tablet (40 mg total) by mouth daily.  Marland Kitchen losartan (COZAAR) 25 MG tablet Take 0.5 tablets (12.5 mg total) by mouth at bedtime.  . metoprolol (LOPRESSOR) 50 MG tablet Take 1.5 tablets (75 mg total) by mouth 2 (two) times daily.  . pantoprazole (PROTONIX) 40 MG tablet Take 1 tablet (40 mg total) by mouth daily. Take 30-60 min before first meal of the day   Allergies  Allergen Reactions  . Vicodin [Hydrocodone-Acetaminophen] Itching, Nausea And Vomiting and Other (See Comments)    GI pains also   Past Medical History:  Diagnosis Date  . Allergy-induced asthma   . Chronic systolic CHF (congestive heart failure) ([place])   . Essential hypertension   . GERD (gastroesophageal reflux disease)   . 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 History of colon polyps   . History of ovarian cyst   . Pelvic pain in female   . Peripartum cardiomyopathy    a. EF 40-45% in 2016, varying by several echoes. F/u echo 06/2015 showed EF 50%.   . Sinus tachycardia    a. 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 b. 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    Family History  Problem Relation Age of Onset  . Asthma Mother   . Crohn's disease Mother   . Colon polyps Mother   . Hyperlipidemia Father   . Hemophilia Father   . COPD Father     smoked  . Hemophilia Son   . Heart attack Maternal Grandfather   . Diabetes Paternal Grandfather   . Stroke Paternal Grandfather   . Heart failure Brother   . Heart failure Paternal Uncle   . Heart failure Paternal Aunt   . COPD Maternal Grandmother     smoked  . Lung cancer Maternal Grandmother     smoked  . Hypertension Neg Hx    Past Surgical History:  Procedure Laterality Date  . CESAREAN SECTION  03-01-2008  . IM PINNING RIGHT ELBOW FX  1992   HARDWARE REMOVED  . LAPAROSCOPY N/A 07/29/2014   Procedure: LAPAROSCOPY DIAGNOSTIC;  Surgeon: Cyril Mourning, MD;  Location: [facility];  Service: Gynecology;  Laterality: N/A;   Social History   Social History  . Marital status: Significant Other    Spouse name: N/A  . Number of children: N/A  . Years of education: N/A   Occupational History  . CNA     Social History Main Topics  . Smoking status: Former Smoker    Packs/day: 0.50    Years: 10.00    Types: Cigarettes    Quit date: 02/13/2015  . Smokeless tobacco: Never Used  . Alcohol use No     Comment: social  . Drug use: No  . Sexual activity: Yes    Birth control/ protection: None   Other Topics Concern  . Not on file   Social History Narrative  . No narrative on file     Review of Systems: General: negative for chills, fever, night sweats or weight changes.  Cardiovascular: negative for chest pain, dyspnea on exertion, edema, orthopnea, palpitations, paroxysmal nocturnal dyspnea or shortness of breath Dermatological: negative for rash Respiratory: negative for cough or wheezing Urologic: negative for hematuria Abdominal: negative for nausea, vomiting, diarrhea, bright red blood per rectum, melena, or hematemesis Neurologic: negative for visual changes,  syncope, or dizziness All other systems reviewed and are otherwise negative except as noted above.   Physical Exam:  Blood pressure 118/82, pulse 90, height 5' 6.75" (1.695 m), weight 257 lb (116.6 kg), last menstrual period 05/27/2016.  General appearance: alert, cooperative, no distress and moderately obese Neck: no carotid bruit and no JVD Lungs: clear to auscultation bilaterally Heart: regular rate and rhythm, S1, S2 normal, no murmur, click, rub or gallop Extremities: no LEE Pulses: 2+ and symmetric Skin: warm and dry Neurologic: Grossly normal  EKG I personally reviewed stress test EKGs. No ischemic changes. official MD review pending.   ASSESSMENT AND PLAN:   1. Atypical Chest Pain: I personally reviewed stress  test EKGs. No ischemic changes. Official MD review pending. Doubt cardiac etiology. ? Bronchospasms/ asthma. ? Need for maintainence inhaler. Refer back to pulmonology. Will also be in communication with Dr. Meda Coffee, regarding further recommendations.   2. 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   3. Chronic systolic CHF - the patient appears euvolemic today, NYHA class II. We discussed continuation of daily weights and low sodium diet. Continue daily Lasix.  4. Hypertension - well controlled. Continue the same regimen.    Lyda Jester PA-C 06/05/2016 10:09 AM

## 2016-06-05 NOTE — Patient Instructions (Signed)
Medication Instructions:  Your physician recommends that you continue on your current medications as directed. Please refer to the Current Medication list given to you today.   Labwork: None ordered  Testing/Procedures: None ordered  Follow-Up: Your physician recommends that you schedule a follow-up appointment in: Jan 2018 with Dr.Nelson   Any Other Special Instructions Will Be Listed Below (If Applicable).     If you need a refill on your cardiac medications before your next appointment, please call your pharmacy.

## 2016-06-27 ENCOUNTER — Encounter: Payer: Self-pay | Admitting: Internal Medicine

## 2016-06-27 ENCOUNTER — Ambulatory Visit (INDEPENDENT_AMBULATORY_CARE_PROVIDER_SITE_OTHER): Payer: 59 | Admitting: Internal Medicine

## 2016-06-27 VITALS — BP 118/68 | HR 117 | Ht 66.75 in | Wt 257.2 lb

## 2016-06-27 DIAGNOSIS — J4540 Moderate persistent asthma, uncomplicated: Secondary | ICD-10-CM | POA: Insufficient documentation

## 2016-06-27 DIAGNOSIS — J4530 Mild persistent asthma, uncomplicated: Secondary | ICD-10-CM | POA: Diagnosis not present

## 2016-06-27 LAB — NITRIC OXIDE: Nitric Oxide: 28

## 2016-06-27 MED ORDER — BUDESONIDE-FORMOTEROL FUMARATE 80-4.5 MCG/ACT IN AERO: INHALATION_SPRAY | RESPIRATORY_TRACT | 11 refills | Status: DC

## 2016-06-27 NOTE — Patient Instructions (Signed)
Plan A = Automatic =  Try symbicort 80 2 pffs at bedtime and if doing great can drop it to one - if not doing great return here  Plan B = Backup Only use your albuterol as a rescue medication to be used if you can't catch your breath by resting or doing a relaxed purse lip breathing pattern.  - The less you use it, the better it will work when you need it. - Ok to use the inhaler up to 2 puffs  every 4 hours if you must but call for appointment if use goes up over your usual need - Don't leave home without it !!  (think of it like the spare tire for your car)   GERD (REFLUX)  is an extremely common cause of respiratory symptoms just like yours , many times with no obvious heartburn at all.    It can be treated with medication, but also with lifestyle changes including elevation of the head of your bed (ideally with 6 inch  bed blocks),  Smoking cessation, avoidance of late meals, excessive alcohol, and avoid fatty foods, chocolate, peppermint, colas, red wine, and acidic juices such as orange juice.  NO MINT OR MENTHOL PRODUCTS SO NO COUGH DROPS  USE SUGARLESS CANDY INSTEAD (Jolley ranchers or Stover's or Life Savers) or even ice chips will also do - the key is to swallow to prevent all throat clearing. NO OIL BASED VITAMINS - use powdered substitutes.    Follow up here yearly for refills - call sooner if problems

## 2016-06-27 NOTE — Progress Notes (Signed)
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 @  onset of pregnancy reported sporadic use of  saba since exp cats in 2003 with tendency to flare sob/wheeze sev times a year , but esp in cold weather and also with spring typically requiring short course prednisone then 3rd IUP   complicated by chf @  third trimester and rx labetolol and did not respond as well to albuterol since then but p delivery did better and didn't need inhaler but at 3 weeks post partum cough /sob while on  labetolol > re -eval by cards:   06/06/15 Dr Marylouise Stacks eval  STOP TAKING LABETALOL NOW START TAKING LISINOPRIL 2.5 MG ONCE DAILY START TAKING CARVEDILOL 3.125 MG BY MOUTH TWICE DAILY INCREASE YOUR LASIX TO 40 MG TWICE DAILY---TAKE ONE TABLET AT 8 AM AND 1 TABLET AT 2 PM START TAKING ADVAIR INHALER 250/50 1 PUFF TWICE DAILY   Did better until week of xmas then breathing got worse > lasix helped transiently and returned to cards   07/12/15 cards note  DOE - not typical for CHF, in the past she had LE edema, lung crackles, orthopnea, right now she has stridor, wheezes, no crackles, no LE edema,  CXR on 12/25 was normal. BNP 8 -  I am suspicious that her problem is not cardiac but pulmonary or ENT, I will prescribe albuterol, refer to pulmonary.    07/20/2015 1st [facility] office visit/ Michelle Kane  / consultation re undx sob  Chief Complaint  Patient presents with  . Pulmonary Consult    Referred by Dr. Meda Coffee. Pt states that she was dxed with allergy induced asthma in 2003. Pt c/o SOB x 2 wks.  She gets SOB when she lies down and also with miminmal exertion such as walking from lobby to exam room today. She has been using rescue inhaler 4 x daily on average.   stopped advair 2 weeks prior to OV  Due to cost and no worse but also no better on saba. Ok at rest but can't lie flat s immediate sense of sob/  can't walk 50 ft s sob / assoc with overt hb/ mild hoarseness but not real coughing  rec Pantoprazole (protonix) 40 mg   Take  30-60 min before first meal of the day and Pepcid (famotidine)  20 mg one @  bedtime until return to office -   Stop lisinopril and replace with losartan 25 mg daily  Only use your albuterol as a rescue medication  GERD  Diet    06/27/2016  f/u ov/Michelle Kane re: asthma vs cardiac asthma/pseudoasthma -  Much better since off acei  Chief Complaint  Patient presents with  . Follow-up    follow up from Cardio for tachycardia and breathing issue, CHF, allergy induced asthma, weather changes affects breathing  Still using albuterol around 3 am despite elevation of hob and pepcid at hs/diet compliance for gerd Really Not limited by breathing from desired activities  And daytime doing much better Once takes saba back to bed and does fine    No obvious day to day or daytime variability or assoc excess/ purulent sputum or mucus plugs or hemoptysis or cp or chest tightness, subjective wheeze or overt sinus or hb symptoms. No unusual exp hx or h/o childhood pna/ asthma or knowledge of premature birth.  Sleeping ok without  nocturnal  or early am exacerbation  of respiratory  c/o's or need for noct saba. Also denies any obvious fluctuation of symptoms with weather or environmental changes or other aggravating or alleviating factors except as outlined above   Current Medications, Allergies, Complete Past Medical History, Past Surgical History, Family History, and Social History were reviewed in [employer] record.  ROS  The following are not active complaints unless bolded sore throat, dysphagia, dental problems, itching, sneezing,  nasal congestion or excess/ purulent secretions, ear ache,   fever, chills, sweats, unintended wt loss, classically pleuritic or exertional cp,  orthopnea pnd or leg swelling, presyncope, palpitations, abdominal pain, anorexia, nausea,  vomiting, diarrhea  or change in bowel or bladder habits, change in stools or urine, dysuria,hematuria,  rash, arthralgias, visual complaints, headache, numbness, weakness or ataxia or problems with walking or coordination,  change in mood/affect or memory.                Objective:   Physical Exam  amb obese wf nad   06/27/2016    257   07/20/15 258 lb (117.028 kg)  07/19/15 259 lb (117.482 kg)  07/12/15 258 lb 6.4 oz (117.209 kg)    Vital signs reviewed  - Note on arrival 02 sats  97% on RA     HEENT: nl dentition, turbinates, and oropharynx. Nl external ear canals without cough reflex   NECK :  without JVD/Nodes/TM/ nl carotid upstrokes bilaterally   LUNGS: no acc muscle use,  Nl contour chest which is clear to A and P bilaterally without cough on insp or exp maneuvers   CV:  RRR  no s3 or murmur or increase in P2, no edema   ABD:  soft and nontender with nl inspiratory excursion in the supine position. No bruits or organomegaly, bowel sounds nl  MS:  Nl gait/ ext warm without deformities, calf tenderness, cyanosis or clubbing No obvious joint restrictions   SKIN: warm and dry without lesions    NEURO:  alert, approp, nl sensorium with  no motor deficits     I personally reviewed images and agree with radiology impression as follows:  CXR:  09/25/15 No active cardiopulmonary disease.   No recent bnp on file              Assessment & Plan:

## 2016-06-28 ENCOUNTER — Ambulatory Visit: Payer: 59 | Admitting: Cardiology

## 2016-06-28 NOTE — Assessment & Plan Note (Signed)
Body mass index is 40.59 kslt trend down but a long way to go Lab Results  Component Value Date   TSH 0.97 03/14/2016     Contributing to gerd tendency/ doe/reviewed the need and the process to achieve and maintain neg calorie balance > defer f/u primary care including intermittently monitoring thyroid status

## 2016-06-28 NOTE — Assessment & Plan Note (Signed)
FENO 06/27/2016  =    28 on no RX - Spirometry 06/27/2016  wnl  On no saba  x 12h - 06/27/2016  After extensive coaching HFA effectiveness =    90% from a baseline of 75%  Despite the perfectly nl exam and spirometry, she probably has mild noct asthma as the chf component is gone. It is possible she's having noct gerd/ non-acid related to obesity as well (see separate a/p)   For now rec max rx for gerd and add symb 80 2 each pm on a trial basis and if dx still in doubt the next step is a methacholine challenge test  I had an extended discussion with the patient reviewing all relevant studies completed to date and  lasting 15 to 20 minutes of a 25 minute visit    Each maintenance medication was reviewed in detail including most importantly the difference between maintenance and prns and under what circumstances the prns are to be triggered using an action plan format that is not reflected in the computer generated alphabetically organized AVS.    Please see AVS for unique instructions that I personally wrote and verbalized to the the pt in detail and then reviewed with pt  by my nurse highlighting any  changes in therapy recommended at today's visit to their plan of care.

## 2016-07-05 ENCOUNTER — Ambulatory Visit (INDEPENDENT_AMBULATORY_CARE_PROVIDER_SITE_OTHER): Payer: 59 | Admitting: Family Medicine

## 2016-07-05 ENCOUNTER — Encounter: Payer: Self-pay | Admitting: Family Medicine

## 2016-07-05 VITALS — BP 116/80 | HR 83 | Temp 97.8°F | Ht 66.0 in | Wt 264.2 lb

## 2016-07-05 DIAGNOSIS — M542 Cervicalgia: Secondary | ICD-10-CM

## 2016-07-05 MED ORDER — CYCLOBENZAPRINE HCL 5 MG PO TABS: 5.0000 mg | ORAL_TABLET | Freq: Three times a day (TID) | ORAL | 0 refills | Status: DC | PRN

## 2016-07-05 MED ORDER — NAPROXEN 500 MG PO TABS: 500.0000 mg | ORAL_TABLET | Freq: Two times a day (BID) | ORAL | 0 refills | Status: DC

## 2016-07-05 MED ORDER — KETOROLAC TROMETHAMINE 30 MG/ML IJ SOLN
30.0000 mg | Freq: Once | INTRAMUSCULAR | Status: AC
  Administered 2016-07-05: 30 mg via INTRAMUSCULAR

## 2016-07-05 NOTE — Progress Notes (Signed)
Musculoskeletal Exam  Patient: Michelle Kane DOB: [birth year]  DOS: 07/05/2016  SUBJECTIVE:  Chief Complaint:   Chief Complaint  Patient presents with  . Neck Pain    Pt states she notciced neckpain x1 day and feels pain in the back of the skull cannot turn her head and having difficulty walking     Michelle Kane is a 30 y.o.  female for evaluation and treatment of neck pain.   Onset:  1  day ago. Sudden, thought she slept on it wrong and thinks things may have gotten worse while at work.  Location: B/l neck, just inferior to base of skull Character:  sharp and throbbing  Progression of issue:  worse Associated symptoms: balance issues, decreased ROM Treatment: to date has been heat, Icy Hot, Advil 400 mg daily.   Neurovascular symptoms: no   ROS: Musculoskeletal/Extremities: +neck pain Neurologic: no new numbness, tingling no weakness   Past Medical History:  Diagnosis Date  . Allergy-induced asthma   . Chronic systolic CHF (congestive heart failure) ([place])   . Essential hypertension   . GERD (gastroesophageal reflux disease)   . 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 History of colon polyps   . History of ovarian cyst   . Pelvic pain in female   . Peripartum cardiomyopathy    a. EF 40-45% in 2016, varying by several echoes. F/u echo 06/2015 showed EF 50%.   . Sinus tachycardia    a. 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 b. 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   Past Surgical History:  Procedure Laterality Date  . CESAREAN SECTION  03-01-2008  . IM PINNING RIGHT ELBOW FX  1992   HARDWARE REMOVED  . LAPAROSCOPY N/A 07/29/2014   Procedure: LAPAROSCOPY DIAGNOSTIC;  Surgeon: Cyril Mourning, MD;  Location: [facility];  Service: Gynecology;  Laterality: N/A;   Family History  Problem Relation Age of Onset  . Asthma Mother   . Crohn's disease Mother   . Colon polyps Mother   . Hyperlipidemia  Father   . Hemophilia Father   . COPD Father     smoked  . Hemophilia Son   . Heart attack Maternal Grandfather   . Diabetes Paternal Grandfather   . Stroke Paternal Grandfather   . Heart failure Brother   . Heart failure Paternal Uncle   . Heart failure Paternal Aunt   . COPD Maternal Grandmother     smoked  . Lung cancer Maternal Grandmother     smoked  . Hypertension Neg Hx    Current Outpatient Prescriptions  Medication Sig Dispense Refill  . albuterol (PROVENTIL HFA;VENTOLIN HFA) 108 (90 Base) MCG/ACT inhaler Inhale 2 puffs into the lungs every 6 (six) hours as needed for wheezing or shortness of breath. 1 Inhaler 2  . budesonide-formoterol (SYMBICORT) 80-4.5 MCG/ACT inhaler 2 pffs at bedtime 1 Inhaler 11  . diazepam (VALIUM) 2 MG tablet Take 1 tablet (2 mg total) by mouth every 12 (twelve) hours as needed for anxiety. 30 tablet 0  . famotidine (PEPCID) 20 MG tablet Take 1 tablet (20 mg total) by mouth at bedtime. 30 tablet 5  . furosemide (LASIX) 40 MG tablet Take 1 tablet (40 mg total) by mouth daily. 90 tablet 3  . losartan (COZAAR) 25 MG tablet Take 0.5 tablets (12.5 mg total) by mouth at bedtime. 15 tablet 11  . metoprolol (LOPRESSOR) 50 MG tablet Take 1.5 tablets (75 mg total)  by mouth 2 (two) times daily. 270 tablet 3  . pantoprazole (PROTONIX) 40 MG tablet Take 1 tablet (40 mg total) by mouth daily. Take 30-60 min before first meal of the day 30 tablet 5   Allergies  Allergen Reactions  . Vicodin [Hydrocodone-Acetaminophen] Itching, Nausea And Vomiting and Other (See Comments)    GI pains also   Social History   Social History  . Marital status: Significant Other   Occupational History  . CNA     Social History Main Topics  . Smoking status: Former Smoker    Packs/day: 0.50    Years: 10.00    Types: Cigarettes    Quit date: 02/13/2015  . Smokeless tobacco: Never Used  . Alcohol use No     Comment: social  . Drug use: No  . Sexual activity: Yes    Birth  control/ protection: None   Objective: VITAL SIGNS: BP 116/80 (BP Location: Left Arm, Patient Position: Sitting, Cuff Size: Large)   Pulse 83   Temp 97.8 F (36.6 C) (Oral)   Ht 5\' 6"  (1.676 m)   Wt 264 lb 3.2 oz (119.8 kg)   LMP 07/02/2016   SpO2 98%   BMI 42.64 kg/m  Constitutional: Well formed, well developed. No acute distress. Thorax & Lungs: No accessory muscle use Extremities: No clubbing. No cyanosis. No edema.  Skin: Warm. Dry. No erythema. No rash.  Musculoskeletal: Neck.   Normal active range of motion: no- decreased flexion/extension, very limited rotation bl.   Normal passive range of motion: no- as with active ROM Tenderness to palpation: yes- over suboccipital triangle group b/l, over midline, over middle portion of trapezius muscle between SP's and AC joint (worse on L), and paraspinal cervical musculature Deformity: no Ecchymosis: no Tests negative: Spurling's Neurologic: Normal sensory function. No focal deficits noted. DTR's equal and symmetry in UE's. No clonus. Psychiatric: Normal mood. Age appropriate judgment and insight. Alert & oriented x 3.    Assessment:  Neck pain - Plan: cyclobenzaprine (FLEXERIL) 5 MG tablet, naproxen (NAPROSYN) 500 MG tablet, ketorolac (TORADOL) 30 MG/ML injection 30 mg  Plan: Orders as above. Likely musculoskeletal. Considered OMT, but she is far to TTP.  Take Flexeril at night and only at night if it makes her drowsy. Short course of anti-inflammatories. Gentle stretches provided in AVS. Do only what she can tolerate. Letter for work excusing for next 2 days given, OK to return sooner if feeling better. F/u prn. If no improvement, consider PT. The patient voiced understanding and agreement to the plan.   [place], DO 07/05/16  2:07 PM

## 2016-07-05 NOTE — Progress Notes (Signed)
Pre visit review using our clinic review tool, if applicable. No additional management support is needed unless otherwise documented below in the visit note. 

## 2016-07-05 NOTE — Patient Instructions (Addendum)
Do not take naproxen for rest of day. Start this tomorrow.  Take the Flexeril at night first. If it makes you sleepy, try not to take during day if you need to be active (like at work or taking care of your child).  Continue using heat.  This is ideal before doing the stretches. Only do what you can tolerate.  EXERCISES RANGE OF MOTION (ROM) AND STRETCHING EXERCISES - Cervical Strain and Sprain These exercises may help you when beginning to rehabilitate your injury. In order to successfully resolve your symptoms, you must improve your posture. These exercises are designed to help reduce the forward-head and rounded-shoulder posture which contributes to this condition. Your symptoms may resolve with or without further involvement from your physician, physical therapist or athletic trainer. While completing these exercises, remember:   Restoring tissue flexibility helps normal motion to return to the joints. This allows healthier, less painful movement and activity.  An effective stretch should be held for at least 20 seconds, although you may need to begin with shorter hold times for comfort.  A stretch should never be painful. You should only feel a gentle lengthening or release in the stretched tissue.  STRETCH- Axial Extensors  Lie on your back on the floor. You may bend your knees for comfort. Place a rolled-up hand towel or dish towel, about 2 inches in diameter, under the part of your head that makes contact with the floor.  Gently tuck your chin, as if trying to make a "double chin," until you feel a gentle stretch at the base of your head.  Hold 15-20 seconds. Repeat 2-3 times. Complete this exercise 1-2 times per day.   STRETCH - Axial Extension   Stand or sit on a firm surface. Assume a good posture: chest up, shoulders drawn back, abdominal muscles slightly tense, knees unlocked (if standing) and feet hip width apart.  Slowly retract your chin so your head slides back and your  chin slightly lowers. Continue to look straight ahead.  You should feel a gentle stretch in the back of your head. Be certain not to feel an aggressive stretch since this can cause headaches later.  Hold for 15-20 seconds. Repeat 2-3 times. Complete this exercise 1-2 times per day.  STRETCH - Cervical Side Bend   Stand or sit on a firm surface. Assume a good posture: chest up, shoulders drawn back, abdominal muscles slightly tense, knees unlocked (if standing) and feet hip width apart.  Without letting your nose or shoulders move, slowly tip your right / left ear to your shoulder until your feel a gentle stretch in the muscles on the opposite side of your neck.  Hold 15-20 seconds. Repeat 2-3 times. Complete this exercise 1-2 times per day.   RANGE OF MOTION - Neck Circles   Stand or sit on a firm surface. Assume a good posture: chest up, shoulders drawn back, abdominal muscles slightly tense, knees unlocked (if standing) and feet hip width apart.  Gently roll your head down and around from the back of one shoulder to the back of the other. The motion should never be forced or painful.  Repeat the motion 10-20 times, or until you feel the neck muscles relax and loosen. Repeat 2-3 times. Complete the exercise 1-2 times per day.

## 2016-07-12 ENCOUNTER — Other Ambulatory Visit: Payer: Self-pay | Admitting: Family Medicine

## 2016-07-12 DIAGNOSIS — M542 Cervicalgia: Secondary | ICD-10-CM

## 2016-07-16 NOTE — Telephone Encounter (Signed)
Please advise for further recommendation. TL/CMA

## 2016-07-16 NOTE — Telephone Encounter (Signed)
OK to do. If she needs more, she needs to be seen. TY.

## 2016-07-17 DIAGNOSIS — Z0279 Encounter for issue of other medical certificate: Secondary | ICD-10-CM

## 2016-07-18 NOTE — Telephone Encounter (Signed)
I have sent in refill #20 tablets and #0 refills to [facility]. TL/CMA

## 2016-08-08 ENCOUNTER — Encounter: Payer: Self-pay | Admitting: Cardiology

## 2016-08-08 ENCOUNTER — Ambulatory Visit (INDEPENDENT_AMBULATORY_CARE_PROVIDER_SITE_OTHER): Payer: 59 | Admitting: Cardiology

## 2016-08-08 VITALS — BP 112/68 | HR 93 | Ht 66.0 in | Wt 268.6 lb

## 2016-08-08 DIAGNOSIS — I5023 Acute on chronic systolic (congestive) heart failure: Secondary | ICD-10-CM | POA: Diagnosis not present

## 2016-08-08 DIAGNOSIS — I429 Cardiomyopathy, unspecified: Secondary | ICD-10-CM

## 2016-08-08 DIAGNOSIS — I10 Essential (primary) hypertension: Secondary | ICD-10-CM

## 2016-08-08 DIAGNOSIS — I420 Dilated cardiomyopathy: Secondary | ICD-10-CM

## 2016-08-08 DIAGNOSIS — I509 Heart failure, unspecified: Secondary | ICD-10-CM

## 2016-08-08 DIAGNOSIS — Q245 Malformation of coronary vessels: Secondary | ICD-10-CM

## 2016-08-08 DIAGNOSIS — R072 Precordial pain: Secondary | ICD-10-CM

## 2016-08-08 MED ORDER — FUROSEMIDE 40 MG PO TABS: 40.0000 mg | ORAL_TABLET | Freq: Every day | ORAL | Status: DC | PRN

## 2016-08-08 NOTE — Patient Instructions (Signed)
Medication Instructions:  1. LASIX 40 MG DAILY ONLY AS NEEDED  Labwork: NONE  Testing/Procedures: CORONARY CT-A  TO BE DONE AT [facility]  Follow-Up: DR. Meda Coffee IN 3 MONTHS  Any Other Special Instructions Will Be Listed Below (If Applicable).     If you need a refill on your cardiac medications before your next appointment, please call your pharmacy.

## 2016-08-08 NOTE — Progress Notes (Signed)
08/08/2016 [name]   Jul 09, 1986  [id number]  Primary Physician Arnette Norris, MD Primary Cardiologist: Dr. Meda Coffee    Reason for Visit/CC: Atypical Chest Pain  HPI:  Patient is a 31 year old female, followed by Dr. Meda Coffee, who presents to clinic for follow-up. 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 She has a history of cardiomyopathy during pregnancy, also felt possibly familial, GERD, allergy-induced asthma, and inappropriate sinus tachycardia. EF was as low as 40% in the past. Her most recent echocardiogram 01/03/2016 showed normalization of her ejection fraction back to 50-55%. She has been on BB and ARB therapy.   She was recently seen by Dr. Meda Coffee in September with complaints of recurrent tachypalpitations. Her metoprolol was increased from 25 mg BID to 50 mg BID. Dr. Meda Coffee ordered a 48 hr monitor to assess for arrhthymias.   48 hour monitor showed no arrhythmias but frequent sinus tachycardia. Patient was instructed by Dr. Meda Coffee to further increase her Metroprolol to 75 mg twice a day. She was seen in f/u on 10/25 after her metoprolol was increased. She was doing well at that time without any issues. EKG showed normal sinus rhythm with a rate of 89 bpm. EKG show minimal voltage criteria for LVH. Unchanged from previous.  She presents back today with complaint of atypical chest pain and occasional dyspnea. Feels like a knot in her chest. She notes associated upper lip and nose tingling when this occurs. Not worse with exertion. It is pleuritic but not positional. Not worse with meals. No particular pattern. Can occur at random. Can last 1-2 hrs at a time. She also notes dyspnea walking up stairs. She has some occasional wheezing but has not used albuterol, in fear of rebound tachycardia. At the same time, she feels this is not her asthma. She also does not feel this is  reflux. She is on Protonix. She was seen by Dr. Melvyn Novas in January. She had normal PFTs. She was suppose to f/u with  him 4 weeks later but was not able to make the appointment due to her son being hospitalized. She has not been seen by him since.   On 06/05/16 she underwent an exercise treadmill test. Max HR was 164. She had no chest pain during test. No ST changes. 1 PVC. BP in clinic today is 118/82. She held all of her meds for stress test. Resting HR is 90. Currently asymptomatic in exam room.   06/08/2017 - the patient continues to have chest pain on exertion and at rest, they radiate to the back. Sometimes she has just upper back pain on exertion. No palpitations or syncope. She continues to work and is able to finish 12 hour shift. No syncope. No LE edema.   Current Meds  Medication Sig  . albuterol (PROVENTIL HFA;VENTOLIN HFA) 108 (90 Base) MCG/ACT inhaler Inhale 2 puffs into the lungs every 6 (six) hours as needed for wheezing or shortness of breath.  . budesonide-formoterol (SYMBICORT) 80-4.5 MCG/ACT inhaler 2 pffs at bedtime  . cyclobenzaprine (FLEXERIL) 5 MG tablet Take 1 tablet (5 mg total) by mouth 3 (three) times daily as needed for muscle spasms.  . diazepam (VALIUM) 2 MG tablet Take 1 tablet (2 mg total) by mouth every 12 (twelve) hours as needed for anxiety.  . famotidine (PEPCID) 20 MG tablet Take 1 tablet (20 mg total) by mouth at bedtime.  . furosemide (LASIX) 40 MG tablet Take 1 tablet (40 mg total) by mouth daily.  Marland Kitchen losartan (COZAAR) 25  MG tablet Take 0.5 tablets (12.5 mg total) by mouth at bedtime.  . naproxen (NAPROSYN) 500 MG tablet TAKE 1 TABLET(500 MG) BY MOUTH TWICE DAILY WITH A MEAL  . pantoprazole (PROTONIX) 40 MG tablet Take 1 tablet (40 mg total) by mouth daily. Take 30-60 min before first meal of the day   Allergies  Allergen Reactions  . Vicodin [Hydrocodone-Acetaminophen] Itching, Nausea And Vomiting and Other (See Comments)    GI pains also   Past Medical History:  Diagnosis Date  . Allergy-induced asthma   . Chronic systolic CHF (congestive heart failure) ([place])   .  Essential hypertension   . GERD (gastroesophageal reflux disease)   . 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 History of colon polyps   . History of ovarian cyst   . Pelvic pain in female   . Peripartum cardiomyopathy    a. EF 40-45% in 2016, varying by several echoes. F/u echo 06/2015 showed EF 50%.   . Sinus tachycardia    a. 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 b. 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   Family History  Problem Relation Age of Onset  . Asthma Mother   . Crohn's disease Mother   . Colon polyps Mother   . Hyperlipidemia Father   . Hemophilia Father   . COPD Father     smoked  . Hemophilia Son   . Heart attack Maternal Grandfather   . Diabetes Paternal Grandfather   . Stroke Paternal Grandfather   . Heart failure Brother   . Heart failure Paternal Uncle   . Heart failure Paternal Aunt   . COPD Maternal Grandmother     smoked  . Lung cancer Maternal Grandmother     smoked  . Hypertension Neg Hx    Past Surgical History:  Procedure Laterality Date  . CESAREAN SECTION  03-01-2008  . IM PINNING RIGHT ELBOW FX  1992   HARDWARE REMOVED  . LAPAROSCOPY N/A 07/29/2014   Procedure: LAPAROSCOPY DIAGNOSTIC;  Surgeon: Cyril Mourning, MD;  Location: [facility];  Service: Gynecology;  Laterality: N/A;   Social History   Social History  . Marital status: Significant Other    Spouse name: N/A  . Number of children: N/A  . Years of education: N/A   Occupational History  . CNA     Social History Main Topics  . Smoking status: Former Smoker    Packs/day: 0.50    Years: 10.00    Types: Cigarettes    Quit date: 02/13/2015  . Smokeless tobacco: Never Used  . Alcohol use No     Comment: social  . Drug use: No  . Sexual activity: Yes    Birth control/ protection: None   Other Topics Concern  . Not on file   Social History Narrative  . No narrative on file     Review of  Systems: General: negative for chills, fever, night sweats or weight changes.  Cardiovascular: negative for chest pain, dyspnea on exertion, edema, orthopnea, palpitations, paroxysmal nocturnal dyspnea or shortness of breath Dermatological: negative for rash Respiratory: negative for cough or wheezing Urologic: negative for hematuria Abdominal: negative for nausea, vomiting, diarrhea, bright red blood per rectum, melena, or hematemesis Neurologic: negative for visual changes, syncope, or dizziness All other systems reviewed and are otherwise negative except as noted above.   Physical Exam:  Blood pressure 112/68, pulse 93, height 5\' 6"  (1.676 m), weight 268 lb 9.6  oz (121.8 kg), SpO2 98 %.  General appearance: alert, cooperative, no distress and moderately obese Neck: no carotid bruit and no JVD Lungs: clear to auscultation bilaterally Heart: regular rate and rhythm, S1, S2 normal, no murmur, click, rub or gallop Extremities: no LEE Pulses: 2+ and symmetric Skin: warm and dry Neurologic: Grossly normal  EKG I personally reviewed stress test EKGs. No ischemic changes. official MD review pending.     ASSESSMENT AND PLAN:   1. Chest Pain: Normal exercise treadmill stress test, however ongoing chest pain, previously believed sec to asthma, however now controlled. We will order calcium score and coronary CTA to evaluate for CAD and possible anomalous coronaries.  2. 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   3. Chronic systolic CHF - the patient appears euvolemic today, NYHA class II. We discussed continuation of daily weights and low sodium diet. Continue daily Lasix.  4. Hypertension - well controlled. Continue the same regimen.    Ena Dawley , MD 08/08/2016 9:06 AM

## 2016-08-13 ENCOUNTER — Encounter: Payer: Self-pay | Admitting: Cardiology

## 2016-08-15 ENCOUNTER — Telehealth: Payer: Self-pay | Admitting: *Deleted

## 2016-08-15 ENCOUNTER — Encounter: Payer: Self-pay | Admitting: Cardiology

## 2016-08-15 NOTE — Telephone Encounter (Signed)
-----   Message from Armando Gang sent at 08/15/2016  9:46 AM EST ----- Regarding: RE: coronary ct schedule for 08-26-16 @ 9:30  nelson to read    ----- Message ----- From: Nuala Alpha, LPN Sent: [id number]   1:37 PM To: Armando Gang Subject: coronary ct                                    Hey I was wondering if the appointment was made for the coronary CT scan  if you could call me and let me know something because I have to make sure if I work that day I can get someone to work for me or swap or something so please have someone call me  [phone] work [phone]  This is from [employer].  You know anything about this?  Nelson ordered this on her when I was gone.  Thanks for everything  Michelle Kane

## 2016-08-15 NOTE — Telephone Encounter (Signed)
-----   Message from Armando Gang sent at 08/15/2016  9:46 AM EST ----- Regarding: RE: coronary ct schedule for 08-26-16 @ 9:30  nelson to read    ----- Message ----- From: Nuala Alpha, LPN Sent: [id number]   1:37 PM To: Armando Gang Subject: coronary ct                                    Hey I was wondering if the appointment was made for the coronary CT scan  if you could call me and let me know something because I have to make sure if I work that day I can get someone to work for me or swap or something so please have someone call me  [phone] work [phone]  This is from [employer].  You know anything about this?  Nelson ordered this on her when I was gone.  Thanks for everything  Michelle Kane

## 2016-08-26 ENCOUNTER — Encounter (HOSPITAL_COMMUNITY): Payer: Self-pay

## 2016-08-26 ENCOUNTER — Ambulatory Visit (HOSPITAL_COMMUNITY)
Admission: RE | Admit: 2016-08-26 | Discharge: 2016-08-26 | Disposition: A | Discharge status: Home / Self Care | Payer: 59 | Source: Ambulatory Visit | Attending: Cardiology | Admitting: Cardiology

## 2016-08-26 ENCOUNTER — Telehealth: Payer: Self-pay | Admitting: *Deleted

## 2016-08-26 DIAGNOSIS — Q245 Malformation of coronary vessels: Secondary | ICD-10-CM

## 2016-08-26 DIAGNOSIS — R911 Solitary pulmonary nodule: Secondary | ICD-10-CM | POA: Insufficient documentation

## 2016-08-26 DIAGNOSIS — R079 Chest pain, unspecified: Secondary | ICD-10-CM | POA: Diagnosis not present

## 2016-08-26 MED ORDER — IOPAMIDOL (ISOVUE-370) INJECTION 76%
INTRAVENOUS | Status: AC
  Administered 2016-08-26: 80 mL
  Filled 2016-08-26: qty 100

## 2016-08-26 MED ORDER — NITROGLYCERIN 0.4 MG SL SUBL
SUBLINGUAL_TABLET | SUBLINGUAL | Status: AC
  Filled 2016-08-26: qty 2

## 2016-08-26 MED ORDER — NITROGLYCERIN 0.4 MG SL SUBL
0.8000 mg | SUBLINGUAL_TABLET | Freq: Once | SUBLINGUAL | Status: AC
  Administered 2016-08-26: 0.8 mg via SUBLINGUAL

## 2016-08-26 MED ORDER — METOPROLOL TARTRATE 5 MG/5ML IV SOLN
INTRAVENOUS | Status: AC
  Filled 2016-08-26: qty 5

## 2016-08-26 MED ORDER — METOPROLOL TARTRATE 5 MG/5ML IV SOLN
5.0000 mg | Freq: Once | INTRAVENOUS | Status: AC
  Administered 2016-08-26: 5 mg via INTRAVENOUS
  Filled 2016-08-26: qty 5

## 2016-08-26 NOTE — Telephone Encounter (Signed)
-----   Message from [name], MD sent at 08/26/2016  4:22 PM EST ----- Her heart CT is completely normal, normal coronary arteries with no cholesterol deposits. There are no coronary anomalies. Her lungs showed 5 mm nodule involving the right upper lobe, we will have to repeat the scan in 6-12 months to  Make sure it is improving or stable.   KN

## 2016-08-26 NOTE — Telephone Encounter (Signed)
Notified the pt that per Dr Meda Coffee, her coronary ct showed that her heart CT is completely normal, showing normal coronary arteries with no cholesterol deposits, and no coronary anomalies.  Informed the pt that per Dr Meda Coffee, her lungs showed 5 mm nodule involving the right upper lobe, and she recommends that we repeat this with a ct chest w contrast in 6-12 months to sure it is improving or stable.  Informed the pt that I will place the order in the system and have a [facility] scheduler call her back to arrange this for 6 months out.  Pt verbalized understanding and agrees with this plan.

## 2016-09-08 ENCOUNTER — Emergency Department (HOSPITAL_BASED_OUTPATIENT_CLINIC_OR_DEPARTMENT_OTHER)
Admission: EM | Admit: 2016-09-08 | Discharge: 2016-09-08 | Disposition: A | Discharge status: Home / Self Care | Payer: 59 | Attending: Emergency Medicine | Admitting: Emergency Medicine

## 2016-09-08 ENCOUNTER — Encounter (HOSPITAL_BASED_OUTPATIENT_CLINIC_OR_DEPARTMENT_OTHER): Payer: Self-pay | Admitting: Emergency Medicine

## 2016-09-08 DIAGNOSIS — I110 Hypertensive heart disease with heart failure: Secondary | ICD-10-CM | POA: Insufficient documentation

## 2016-09-08 DIAGNOSIS — R69 Illness, unspecified: Secondary | ICD-10-CM

## 2016-09-08 DIAGNOSIS — J029 Acute pharyngitis, unspecified: Secondary | ICD-10-CM | POA: Insufficient documentation

## 2016-09-08 DIAGNOSIS — I5023 Acute on chronic systolic (congestive) heart failure: Secondary | ICD-10-CM | POA: Insufficient documentation

## 2016-09-08 DIAGNOSIS — R0981 Nasal congestion: Secondary | ICD-10-CM | POA: Diagnosis not present

## 2016-09-08 DIAGNOSIS — Z79899 Other long term (current) drug therapy: Secondary | ICD-10-CM | POA: Insufficient documentation

## 2016-09-08 DIAGNOSIS — R6883 Chills (without fever): Secondary | ICD-10-CM | POA: Diagnosis not present

## 2016-09-08 DIAGNOSIS — R05 Cough: Secondary | ICD-10-CM | POA: Diagnosis not present

## 2016-09-08 DIAGNOSIS — R51 Headache: Secondary | ICD-10-CM | POA: Diagnosis not present

## 2016-09-08 DIAGNOSIS — M791 Myalgia: Secondary | ICD-10-CM | POA: Diagnosis not present

## 2016-09-08 DIAGNOSIS — J45909 Unspecified asthma, uncomplicated: Secondary | ICD-10-CM | POA: Insufficient documentation

## 2016-09-08 DIAGNOSIS — J111 Influenza due to unidentified influenza virus with other respiratory manifestations: Secondary | ICD-10-CM | POA: Diagnosis not present

## 2016-09-08 DIAGNOSIS — Z87891 Personal history of nicotine dependence: Secondary | ICD-10-CM | POA: Insufficient documentation

## 2016-09-08 LAB — INFLUENZA PANEL BY PCR (TYPE A & B)
INFLAPCR: NEGATIVE
INFLBPCR: NEGATIVE

## 2016-09-08 MED ORDER — BENZONATATE 100 MG PO CAPS: 100.0000 mg | ORAL_CAPSULE | Freq: Three times a day (TID) | ORAL | 0 refills | Status: DC

## 2016-09-08 MED ORDER — OSELTAMIVIR PHOSPHATE 75 MG PO CAPS: 75.0000 mg | ORAL_CAPSULE | Freq: Two times a day (BID) | ORAL | 0 refills | Status: DC

## 2016-09-08 NOTE — ED Notes (Signed)
Pt reports cough x 2 days.  Sent home from work today.  Requesting a flu swab.

## 2016-09-08 NOTE — ED Provider Notes (Signed)
[facility] DEPT [facility] Provider Note   CSN: [id number] Arrival date & time: 09/08/16  1137     History   Chief Complaint Chief Complaint  Patient presents with  . Cough    HPI Michelle Kane is a 31 y.o. female who presents to the ED with cough, ear pain, chills sore throat. 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   The history is provided by the patient. No language interpreter was used.  Cough  This is a new problem. The current episode started 2 days ago. The problem has not changed since onset.The cough is productive of purulent sputum. There has been no fever. Associated symptoms include chills, ear pain, headaches, sore throat and myalgias. Pertinent negatives include no eye redness. She has tried nothing for the symptoms. She is not a smoker. Her past medical history is significant for bronchitis.    Past Medical History:  Diagnosis Date  . Allergy-induced asthma   . Chronic systolic CHF (congestive heart failure) ([place])   . Essential hypertension   . GERD (gastroesophageal reflux disease)   . 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 History of colon polyps   . History of ovarian cyst   . Pelvic pain in female   . Peripartum cardiomyopathy    a. EF 40-45% in 2016, varying by several echoes. F/u echo 06/2015 showed EF 50%.   . Sinus tachycardia    a. 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 b. 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    Patient Active Problem List   Diagnosis Date Noted  . Lung nodule 08/26/2016  . Mild persistent asthma without complication [id number]  . Well woman exam 12/19/2015  . RLS (restless legs syndrome) 12/19/2015  . Anxiety state 12/19/2015  . Essential hypertension 10/09/2015  . Acute on chronic systolic CHF (congestive heart failure) ([place]) 10/09/2015  . Nonischemic congestive cardiomyopathy ([place]) 10/09/2015  . Dyspnea 07/20/2015  .  SOB (shortness of breath) 07/12/2015  . Acute congestive heart failure ([place]) 06/06/2015  . Cardiomyopathy ([place]) 05/18/2015  . Hypertension in pregnancy, preeclampsia, severe 05/18/2015  . Normal pregnancy, incidental 05/12/2015  . Pregnancy 05/11/2015  . Inappropriate sinus tachycardia 04/13/2015  . H/O cardiomyopathy 04/13/2015  . H/O cardiovascular disorder 04/13/2015  . Chest pain 01/12/2015  . Palpitations 01/12/2015  . Decreased cardiac ejection fraction 01/12/2015  . Vaginal bleeding before [redacted] weeks gestation   . Abdominal pain in pregnancy, antepartum   . Obesity affecting pregnancy in second trimester, antepartum   . Chronic female pelvic pain 08/18/2014  . Severe obesity (BMI >= 40) ([place]) 01/12/2013  . Fatty liver 01/12/2013  . Adiposity 01/12/2013  . GERD (gastroesophageal reflux disease)   . Asthma with acute exacerbation 11/27/2011  . Excess, menstruation 11/27/2011    Past Surgical History:  Procedure Laterality Date  . CESAREAN SECTION  03-01-2008  . IM PINNING RIGHT ELBOW FX  1992   HARDWARE REMOVED  . LAPAROSCOPY N/A 07/29/2014   Procedure: LAPAROSCOPY DIAGNOSTIC;  Surgeon: Cyril Mourning, MD;  Location: [facility];  Service: Gynecology;  Laterality: N/A;    OB History    Gravida Para Term Preterm AB Living   4 2 2   1 2    SAB TAB Ectopic Multiple Live Births   1               Home Medications    Prior  to Admission medications   Medication Sig Start Date End Date Taking? Authorizing Provider  albuterol (PROVENTIL HFA;VENTOLIN HFA) 108 (90 Base) MCG/ACT inhaler Inhale 2 puffs into the lungs every 6 (six) hours as needed for wheezing or shortness of breath. 05/08/16   Brittainy M Simmons, PA-C  benzonatate (TESSALON) 100 MG capsule Take 1 capsule (100 mg total) by mouth every 8 (eight) hours. 09/08/16   Dawayne Ohair Bunnie Pion, NP  budesonide-formoterol [facility]) 80-4.5 MCG/ACT inhaler 2 pffs at bedtime 06/27/16   Tanda Rockers, MD    cyclobenzaprine (FLEXERIL) 5 MG tablet Take 1 tablet (5 mg total) by mouth 3 (three) times daily as needed for muscle spasms. 07/05/16   Shelda Pal, DO  diazepam (VALIUM) 2 MG tablet Take 1 tablet (2 mg total) by mouth every 12 (twelve) hours as needed for anxiety. 12/19/15   Lucille Passy, MD  famotidine (PEPCID) 20 MG tablet Take 1 tablet (20 mg total) by mouth at bedtime. 05/08/16   Brittainy Erie Noe, PA-C  furosemide (LASIX) 40 MG tablet Take 1 tablet (40 mg total) by mouth daily as needed. 08/08/16 11/06/16  [name], MD  losartan (COZAAR) 25 MG tablet Take 0.5 tablets (12.5 mg total) by mouth at bedtime. 10/09/15   [name], MD  metoprolol (LOPRESSOR) 50 MG tablet Take 1.5 tablets (75 mg total) by mouth 2 (two) times daily. 05/01/16 07/30/16  [name], MD  naproxen (NAPROSYN) 500 MG tablet TAKE 1 TABLET(500 MG) BY MOUTH TWICE DAILY WITH A MEAL 07/18/16   Shelda Pal, DO  oseltamivir (TAMIFLU) 75 MG capsule Take 1 capsule (75 mg total) by mouth every 12 (twelve) hours. 09/08/16   Lequire, NP  pantoprazole (PROTONIX) 40 MG tablet Take 1 tablet (40 mg total) by mouth daily. Take 30-60 min before first meal of the day 05/08/16   Consuelo Pandy, PA-C    Family History Family History  Problem Relation Age of Onset  . Asthma Mother   . Crohn's disease Mother   . Colon polyps Mother   . Hyperlipidemia Father   . Hemophilia Father   . COPD Father     smoked  . Hemophilia Son   . Heart attack Maternal Grandfather   . Diabetes Paternal Grandfather   . Stroke Paternal Grandfather   . Heart failure Brother   . Heart failure Paternal Uncle   . Heart failure Paternal Aunt   . COPD Maternal Grandmother     smoked  . Lung cancer Maternal Grandmother     smoked  . Hypertension Neg Hx     Social History Social History  Substance Use Topics  . Smoking status: Former Smoker    Packs/day: 0.50    Years: 10.00    Types: Cigarettes    Quit  date: 02/13/2015  . Smokeless tobacco: Never Used  . Alcohol use No     Comment: social     Allergies   Vicodin [hydrocodone-acetaminophen]   Review of Systems Review of Systems  Constitutional: Positive for chills. Negative for fever.  HENT: Positive for congestion, ear pain and sore throat.   Eyes: Negative for pain, redness and itching.  Respiratory: Positive for cough.   Gastrointestinal: Negative for abdominal pain, diarrhea and vomiting.  Genitourinary: Negative for dysuria and frequency.  Musculoskeletal: Positive for myalgias.  Skin: Negative for rash.  Neurological: Positive for headaches. Negative for syncope.  Psychiatric/Behavioral: Negative for confusion.     Physical Exam Updated Vital  Signs BP 119/85 (BP Location: Left Arm)   Pulse 89   Temp 98.3 F (36.8 C) (Oral)   Resp 18   Ht 5\' 6"  (1.676 m)   Wt 115.7 kg   LMP 09/04/2016   SpO2 100%   BMI 41.16 kg/m   Physical Exam  Constitutional: She is oriented to person, place, and time. She appears well-developed and well-nourished. No distress.  HENT:  Head: Normocephalic.  Right Ear: Tympanic membrane normal.  Left Ear: Tympanic membrane normal.  Nose: Nose normal.  Mouth/Throat: Uvula is midline and mucous membranes are normal. No oropharyngeal exudate, posterior oropharyngeal edema or posterior oropharyngeal erythema.  Eyes: EOM are normal. Pupils are equal, round, and reactive to light.  Neck: Normal range of motion. Neck supple.  Cardiovascular: Normal rate and regular rhythm.   Pulmonary/Chest: Effort normal. She has no wheezes. She has no rales.  Musculoskeletal: Normal range of motion.  Lymphadenopathy:    She has no cervical adenopathy.  Neurological: She is alert and oriented to person, place, and time. No cranial nerve deficit.  Skin: Skin is warm and dry.  Psychiatric: She has a normal mood and affect. Her behavior is normal.  Nursing note and vitals reviewed.    ED Treatments / Results   Labs (all labs ordered are listed, but only abnormal results are displayed) Labs Reviewed  RESPIRATORY PANEL BY PCR    Radiology No results found.  Procedures Procedures (including critical care time)  Medications Ordered in ED Medications - No data to display   Initial Impression / Assessment and Plan / ED Course  I have reviewed the triage vital signs and the nursing notes.   Final Clinical Impressions(s) / ED Diagnoses  SUBJECTIVE:  Michelle Kane is a 31 y.o. female who present complaining of flu-like symptoms: chills, myalgias, congestion, sore throat and cough for 2 days. Denies dyspnea or wheezing.  OBJECTIVE: Appears moderately ill but not toxic; temperature as noted in vitals. Ears normal. Throat and pharynx normal.  Neck supple. No adenopathy in the neck. Sinuses non tender. The chest is clear.  ASSESSMENT: Influenza  PLAN: Symptomatic therapy suggested: rest, increase fluids, gargle prn for sore throat, use mist of vaporizer prn and return to the ED prn if symptoms persist or worsen. Tamiflu started, cough medication, cool mist vaporizer, tylenol and ibuprofen prn.   Final diagnoses:  Influenza-like illness    New Prescriptions New Prescriptions   BENZONATATE (TESSALON) 100 MG CAPSULE    Take 1 capsule (100 mg total) by mouth every 8 (eight) hours.   OSELTAMIVIR (TAMIFLU) 75 MG CAPSULE    Take 1 capsule (75 mg total) by mouth every 12 (twelve) hours.     [place], [place] 09/08/16 [place], MD 09/21/16 [phone]

## 2016-09-08 NOTE — ED Triage Notes (Signed)
Cough, sore throat, chills, h/a since Friday.

## 2016-09-09 ENCOUNTER — Ambulatory Visit: Payer: Self-pay | Admitting: Psychology

## 2016-09-09 MED FILL — BENZONATATE 100 MG CAP: 100 | 7 days supply | Qty: 21 | Fill #0

## 2016-09-09 MED FILL — OSELTAMIVIR PHOSPHATE 75 MG: 75 | 5 days supply | Qty: 10 | Fill #0

## 2016-09-10 MED FILL — LOSARTAN POTASSIUM 25 MG TA: 25 | 30 days supply | Qty: 15 | Fill #0

## 2016-09-10 MED FILL — FAMOTIDINE 20 MG TABLET: 20 | 30 days supply | Qty: 30 | Fill #0

## 2016-09-10 MED FILL — METOPROLOL TARTRATE 50 MG T: 50 | 90 days supply | Qty: 270 | Fill #0

## 2016-09-10 MED FILL — FUROSEMIDE 40 MG TABLET: 40 | 30 days supply | Qty: 60 | Fill #0

## 2016-09-10 MED FILL — PANTOPRAZOLE SOD DR 40 MG T: 40 | 30 days supply | Qty: 30 | Fill #0

## 2016-09-10 MED FILL — SYMBICORT 80-4.5 MCG INH: 80-4.5 | 30 days supply | Qty: 10 | Fill #0

## 2016-10-10 ENCOUNTER — Other Ambulatory Visit: Payer: Self-pay | Admitting: Cardiology

## 2016-10-10 MED FILL — FAMOTIDINE 20 MG TABLET: 20 | 30 days supply | Qty: 30 | Fill #1

## 2016-10-10 MED FILL — FUROSEMIDE 40 MG TABLET: 40 | 90 days supply | Qty: 180 | Fill #0

## 2016-10-10 MED FILL — LOSARTAN POTASSIUM 25 MG TA: 25 | 90 days supply | Qty: 45 | Fill #0

## 2016-10-10 MED FILL — SYMBICORT 80-4.5 MCG INH: 80-4.5 | 30 days supply | Qty: 10 | Fill #1

## 2016-10-10 MED FILL — PANTOPRAZOLE SOD DR 40 MG T: 40 | 30 days supply | Qty: 30 | Fill #1

## 2016-10-25 ENCOUNTER — Telehealth: Payer: Self-pay | Admitting: *Deleted

## 2016-10-25 ENCOUNTER — Encounter: Payer: Self-pay | Admitting: Cardiology

## 2016-10-25 NOTE — Telephone Encounter (Signed)
Dr. Silas [place] I know I have a appointment with you this month on the 30th but I just wanted to get in touch with you because the past week I have been being really tired more then normal and I had to start back up on my Lasix because I built back up with fluid and over night I went down 16 pounds but I wasn't gaining like that like I was gaining fluid and I didn't have the shortness of breath and I didn't experience the swelling like normal this go round everything was way different then what I normally experience. I weigh my self every day and I drink nothing but water but I have no clue as to who or how I had that much fluid on me and I not notice I don't know if I was actually loosing fat but gaining water weight so it was staying round about the same for the most part... but like I said I know I have a appointment with you this month but with that much weight loss over night after taking a Lasix I figured I would let you know .Marland Kitchen      Lissa     10/25/16-- This is copied from a MyChart message. I called pt about this message. Pt states she has been taking lasix regularly, not just prn, she feels she needs to continue taking lasix regularly. Pt has an appointment already scheduled with Dr Meda Coffee 11/11/16. I offered pt appt next week, she did not feel she needed a sooner appointment. 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 Pt states this is an FYI for Dr Meda Coffee Pt is aware that Dr Meda Coffee is out of the office next week and  that I have forwarded the MyChart message to Dr Meda Coffee for her review.

## 2016-10-30 ENCOUNTER — Encounter: Payer: Self-pay | Admitting: Family Medicine

## 2016-10-30 ENCOUNTER — Ambulatory Visit (INDEPENDENT_AMBULATORY_CARE_PROVIDER_SITE_OTHER): Payer: 59 | Admitting: Family Medicine

## 2016-10-30 VITALS — BP 122/84 | HR 90 | Temp 98.1°F | Wt 259.0 lb

## 2016-10-30 DIAGNOSIS — R202 Paresthesia of skin: Secondary | ICD-10-CM

## 2016-10-30 DIAGNOSIS — F411 Generalized anxiety disorder: Secondary | ICD-10-CM | POA: Diagnosis not present

## 2016-10-30 LAB — CBC WITH DIFFERENTIAL/PLATELET
BASOS ABS: 0 10*3/uL (ref 0.0–0.1)
Basophils Relative: 0.6 % (ref 0.0–3.0)
Eosinophils Absolute: 0.2 10*3/uL (ref 0.0–0.7)
Eosinophils Relative: 3.4 % (ref 0.0–5.0)
HEMATOCRIT: 38.1 % (ref 36.0–46.0)
Hemoglobin: 12.7 g/dL (ref 12.0–15.0)
Lymphocytes Relative: 25.3 % (ref 12.0–46.0)
Lymphs Abs: 1.8 10*3/uL (ref 0.7–4.0)
MCHC: 33.3 g/dL (ref 30.0–36.0)
MCV: 85 fl (ref 78.0–100.0)
Monocytes Absolute: 0.4 10*3/uL (ref 0.1–1.0)
Monocytes Relative: 5.1 % (ref 3.0–12.0)
NEUTROS ABS: 4.6 10*3/uL (ref 1.4–7.7)
Neutrophils Relative %: 65.6 % (ref 43.0–77.0)
PLATELETS: 238 10*3/uL (ref 150.0–400.0)
RBC: 4.48 Mil/uL (ref 3.87–5.11)
RDW: 14.7 % (ref 11.5–15.5)
WBC: 7.1 10*3/uL (ref 4.0–10.5)

## 2016-10-30 LAB — BRAIN NATRIURETIC PEPTIDE: Pro B Natriuretic peptide (BNP): 8 pg/mL (ref 0.0–100.0)

## 2016-10-30 LAB — COMPREHENSIVE METABOLIC PANEL
ALK PHOS: 49 U/L (ref 39–117)
ALT: 17 U/L (ref 0–35)
AST: 15 U/L (ref 0–37)
Albumin: 4 g/dL (ref 3.5–5.2)
BILIRUBIN TOTAL: 0.5 mg/dL (ref 0.2–1.2)
BUN: 9 mg/dL (ref 6–23)
CO2: 26 meq/L (ref 19–32)
CREATININE: 0.61 mg/dL (ref 0.40–1.20)
Calcium: 9.2 mg/dL (ref 8.4–10.5)
Chloride: 105 mEq/L (ref 96–112)
GFR: 121.86 mL/min (ref >60.00)
GLUCOSE: 97 mg/dL (ref 70–99)
Potassium: 3.1 mEq/L — ABNORMAL LOW (ref 3.5–5.1)
Sodium: 137 mEq/L (ref 135–145)
TOTAL PROTEIN: 7.1 g/dL (ref 6.0–8.3)

## 2016-10-30 LAB — TSH: TSH: 0.76 u[IU]/mL (ref 0.35–4.50)

## 2016-10-30 LAB — VITAMIN B12: Vitamin B-12: 579 pg/mL (ref 211–911)

## 2016-10-30 MED ORDER — DIAZEPAM 2 MG PO TABS: 2.0000 mg | ORAL_TABLET | Freq: Two times a day (BID) | ORAL | 0 refills | Status: DC | PRN

## 2016-10-30 MED FILL — diazePAM 2 MG TABS: 2 | 15 days supply | Qty: 30 | Fill #0

## 2016-10-30 NOTE — Assessment & Plan Note (Signed)
Deteriorated. Diazepam refilled.  Referred back to psychiatry. The patient indicates understanding of these issues and agrees with the plan.

## 2016-10-30 NOTE — Patient Instructions (Addendum)
Good to see you. I will call you with your lab results.   

## 2016-10-30 NOTE — Progress Notes (Signed)
Subjective:   Patient ID: Michelle Kane, female    DOB: [birth year], 31 y.o.   MRN: [id number]  Michelle Kane is a pleasant 31 y.o. year old female who presents to clinic today with Facial Tingling (Started yesterday around the nose and mouth. Intermittent.)  on 10/30/2016  HPI:   H/o cardiomyopathy and sinus tachycardia- sees Dr. Meda Coffee.  Since yesterday, tingling on her nose and around mouth, intermittent.  Has had this before and it was electrolyte abnormality.  She is on high dose lasix.  Cardiologist is out of town until next week.  Denies any slurred speech or UE or LE weakness.   Her anxiety is worsening.  She is asking for refill on Diazepam.  Was seeing psychiatry regularly but has not seen anyone in over a year. Current Outpatient Prescriptions on File Prior to Visit  Medication Sig Dispense Refill  . albuterol (PROVENTIL HFA;VENTOLIN HFA) 108 (90 Base) MCG/ACT inhaler Inhale 2 puffs into the lungs every 6 (six) hours as needed for wheezing or shortness of breath. 1 Inhaler 2  . budesonide-formoterol (SYMBICORT) 80-4.5 MCG/ACT inhaler 2 pffs at bedtime 1 Inhaler 11  . famotidine (PEPCID) 20 MG tablet Take 1 tablet (20 mg total) by mouth at bedtime. 30 tablet 5  . furosemide (LASIX) 40 MG tablet TAKE 1 TABLET BY MOUTH TWICE DAILY 180 tablet 2  . losartan (COZAAR) 25 MG tablet TAKE 1/2 TABLET BY MOUTH AT BEDTIME 45 tablet 2  . pantoprazole (PROTONIX) 40 MG tablet Take 1 tablet (40 mg total) by mouth daily. Take 30-60 min before first meal of the day 30 tablet 5  . metoprolol (LOPRESSOR) 50 MG tablet Take 1.5 tablets (75 mg total) by mouth 2 (two) times daily. 270 tablet 3   No current facility-administered medications on file prior to visit.     Allergies  Allergen Reactions  . Vicodin [Hydrocodone-Acetaminophen] Itching, Nausea And Vomiting and Other (See Comments)    GI pains also    Past Medical History:  Diagnosis Date  . Allergy-induced asthma   . Chronic  systolic CHF (congestive heart failure) ([place])   . Essential hypertension   . GERD (gastroesophageal reflux disease)   . 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 History of colon polyps   . History of ovarian cyst   . Pelvic pain in female   . Peripartum cardiomyopathy    a. EF 40-45% in 2016, varying by several echoes. F/u echo 06/2015 showed EF 50%.   . Sinus tachycardia    a. 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 b. 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    Past Surgical History:  Procedure Laterality Date  . CESAREAN SECTION  03-01-2008  . IM PINNING RIGHT ELBOW FX  1992   HARDWARE REMOVED  . LAPAROSCOPY N/A 07/29/2014   Procedure: LAPAROSCOPY DIAGNOSTIC;  Surgeon: Cyril Mourning, MD;  Location: [facility];  Service: Gynecology;  Laterality: N/A;    Family History  Problem Relation Age of Onset  . Asthma Mother   . Crohn's disease Mother   . Colon polyps Mother   . Hyperlipidemia Father   . Hemophilia Father   . COPD Father     smoked  . Hemophilia Son   . Heart attack Maternal Grandfather   . Diabetes Paternal Grandfather   . Stroke Paternal Grandfather   . Heart failure Brother   . Heart failure Paternal Uncle   . Heart failure  Paternal Aunt   . COPD Maternal Grandmother     smoked  . Lung cancer Maternal Grandmother     smoked  . Hypertension Neg Hx     Social History   Social History  . Marital status: Significant Other    Spouse name: N/A  . Number of children: N/A  . Years of education: N/A   Occupational History  . CNA     Social History Main Topics  . Smoking status: Former Smoker    Packs/day: 0.50    Years: 10.00    Types: Cigarettes    Quit date: 02/13/2015  . Smokeless tobacco: Never Used  . Alcohol use No     Comment: social  . Drug use: No  . Sexual activity: Yes    Birth control/ protection: None   Other Topics Concern  . Not on file   Social History Narrative    . No narrative on file   The PMH, PSH, Social History, Family History, Medications, and allergies have been reviewed in [facility], and have been updated if relevant.   Review of Systems  HENT: Negative.   Respiratory: Negative for shortness of breath.   Cardiovascular: Negative.   Gastrointestinal: Negative.   Endocrine: Negative.   Genitourinary: Negative.   Musculoskeletal: Negative.   Allergic/Immunologic: Negative.   Neurological: Positive for numbness. Negative for facial asymmetry, light-headedness and headaches.  Psychiatric/Behavioral: Negative for hallucinations and self-injury. The patient is nervous/anxious. The patient is not hyperactive.   All other systems reviewed and are negative.      Objective:    BP 122/84 (BP Location: Left Arm, Patient Position: Sitting, Cuff Size: Large)   Pulse 90   Temp 98.1 F (36.7 C) (Oral)   Wt 259 lb (117.5 kg)   SpO2 98%   BMI 41.80 kg/m    Physical Exam  Constitutional: She is oriented to person, place, and time. She appears well-developed and well-nourished. No distress.  HENT:  Head: Normocephalic and atraumatic.  Eyes: Conjunctivae are normal.  Cardiovascular: Normal rate.   Pulmonary/Chest: Effort normal and breath sounds normal.  Musculoskeletal: She exhibits no edema.  Neurological: She is alert and oriented to person, place, and time. No cranial nerve deficit.  Skin: Skin is warm and dry. She is not diaphoretic.  Psychiatric:  tearful  Nursing note and vitals reviewed.         Assessment & Plan:   Anxiety state - Plan: Ambulatory referral to Psychiatry  Paresthesia - Plan: Vitamin B12, Comprehensive metabolic panel, CBC with Differential/Platelet, TSH, Brain natriuretic peptide No Follow-up on file.

## 2016-10-30 NOTE — Progress Notes (Signed)
Pre visit review using our clinic review tool, if applicable. No additional management support is needed unless otherwise documented below in the visit note. 

## 2016-10-30 NOTE — Assessment & Plan Note (Signed)
Etiology unclear but given that she has had these symptoms in past with electrolyte imbalance and she is on high dose lasix, will check labs. No red flag symptoms on history or exam. The patient indicates understanding of these issues and agrees with the plan.

## 2016-11-11 ENCOUNTER — Encounter: Payer: Self-pay | Admitting: Cardiology

## 2016-11-11 ENCOUNTER — Ambulatory Visit (INDEPENDENT_AMBULATORY_CARE_PROVIDER_SITE_OTHER): Payer: 59 | Admitting: Cardiology

## 2016-11-11 VITALS — BP 124/82 | HR 81 | Ht 66.0 in | Wt 258.0 lb

## 2016-11-11 DIAGNOSIS — J0110 Acute frontal sinusitis, unspecified: Secondary | ICD-10-CM | POA: Diagnosis not present

## 2016-11-11 DIAGNOSIS — I5023 Acute on chronic systolic (congestive) heart failure: Secondary | ICD-10-CM

## 2016-11-11 DIAGNOSIS — J019 Acute sinusitis, unspecified: Secondary | ICD-10-CM | POA: Insufficient documentation

## 2016-11-11 DIAGNOSIS — I420 Dilated cardiomyopathy: Secondary | ICD-10-CM

## 2016-11-11 MED ORDER — POTASSIUM CHLORIDE CRYS ER 20 MEQ PO TBCR: EXTENDED_RELEASE_TABLET | ORAL | 3 refills | Status: DC

## 2016-11-11 MED ORDER — AZITHROMYCIN 250 MG PO TABS: ORAL_TABLET | ORAL | 0 refills | Status: DC

## 2016-11-11 MED FILL — POTASSIUM CL ER 20 MEQ TABL: 20 | 30 days supply | Qty: 30 | Fill #0

## 2016-11-11 MED FILL — AZITHROMYCIN 250 MG TABLET: 250 | 5 days supply | Qty: 6 | Fill #0

## 2016-11-11 NOTE — Patient Instructions (Signed)
Medication Instructions:   START TAKING Z-PAK 250 MG---PLEASE TAKE 2 TABS TODAY AS YOUR 1ST INITIAL DOSE, THEN TAKE 1 TAB DAILY THEREAFTER UNTIL PACK COMPLETE  START TAKING K-DUR 20 mEq BY MOUTH DAILY FOR ONE WEEK, THEN TAKE 1 TAB BY MOUTH DAILY ONLY ON DAYS YOU TAKE YOUR LASIX, THEREAFTER.      Follow-Up:  3 MONTHS WITH DR Melvern AT [phone] AND ASK TO BE CONNECTED TO THE BARIATRIC SEMINAR CLASSES.   SCHEDULE AS DIRECTED WITH THEM.    If you need a refill on your cardiac medications before your next appointment, please call your pharmacy.

## 2016-11-11 NOTE — Progress Notes (Signed)
11/11/2016 [name]   [birth year]  [id number]  Primary Physician Arnette Norris, MD Primary Cardiologist: Dr. Meda Coffee    Reason for Visit/CC: Atypical Chest Pain  HPI:  Patient is a 31 year old female, followed by Dr. Meda Coffee, who presents to clinic for follow-up. 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 She has a history of cardiomyopathy during pregnancy, also felt possibly familial, GERD, allergy-induced asthma, and inappropriate sinus tachycardia. EF was as low as 40% in the past. Her most recent echocardiogram 01/03/2016 showed normalization of her ejection fraction back to 50-55%. She has been on BB and ARB therapy.   She was recently seen by Dr. Meda Coffee in September with complaints of recurrent tachypalpitations. Her metoprolol was increased from 25 mg BID to 50 mg BID. Dr. Meda Coffee ordered a 48 hr monitor to assess for arrhthymias.   48 hour monitor showed no arrhythmias but frequent sinus tachycardia. Patient was instructed by Dr. Meda Coffee to further increase her Metroprolol to 75 mg twice a day. She was seen in f/u on 10/25 after her metoprolol was increased. She was doing well at that time without any issues. EKG showed normal sinus rhythm with a rate of 89 bpm. EKG show minimal voltage criteria for LVH. Unchanged from previous.  She presents back today with complaint of atypical chest pain and occasional dyspnea. Feels like a knot in her chest. She notes associated upper lip and nose tingling when this occurs. Not worse with exertion. It is pleuritic but not positional. Not worse with meals. No particular pattern. Can occur at random. Can last 1-2 hrs at a time. She also notes dyspnea walking up stairs. She has some occasional wheezing but has not used albuterol, in fear of rebound tachycardia. At the same time, she feels this is not her asthma. She also does not feel this is  reflux. She is on Protonix. She was seen by Dr. Melvyn Novas in January. She had normal PFTs. She was suppose to f/u with  him 4 weeks later but was not able to make the appointment due to her son being hospitalized. She has not been seen by him since.   On 06/05/16 she underwent an exercise treadmill test. Max HR was 164. She had no chest pain during test. No ST changes. 1 PVC. BP in clinic today is 118/82. She held all of her meds for stress test. Resting HR is 90. Currently asymptomatic in exam room.   06/08/2017 - the patient continues to have chest pain on exertion and at rest, they radiate to the back. Sometimes she has just upper back pain on exertion. No palpitations or syncope. She continues to work and is able to finish 12 hour shift. No syncope. No LE edema.  11/11/2016 - patient is coming after 5 months, she denies any chest pain however continues to her and exertional dyspnea, she states Lasix only as needed, and hasn't had significant lower extremity edema orthopnea or personal nocturnal dyspnea. She has been experiencing fever chills with nasal congestion for last 3 days. She has been trying really hard to eat healthy with the help of dietitian exercise however lost 10 pounds and it's stagnating.  Important information her father who has known coronary artery disease status post bypass in the past is currently being diagnosed with decreased LV EF of 20% and is going to undergo another cardiac catheterization in order to determine if he would qualify for vascularization otherwise he will be referred for heart transplant.   No outpatient prescriptions  have been marked as taking for the 11/11/16 encounter (Office Visit) with [name], MD.   Allergies  Allergen Reactions  . Vicodin [Hydrocodone-Acetaminophen] Itching, Nausea And Vomiting and Other (See Comments)    GI pains also   Past Medical History:  Diagnosis Date  . Allergy-induced asthma   . Chronic systolic CHF (congestive heart failure) ([place])   . Essential hypertension   . GERD (gastroesophageal reflux disease)   . 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 History of colon polyps   . History of ovarian cyst   . Pelvic pain in female   . Peripartum cardiomyopathy    a. EF 40-45% in 2016, varying by several echoes. F/u echo 06/2015 showed EF 50%.   . Sinus tachycardia    a. 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 b. 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   Family History  Problem Relation Age of Onset  . Asthma Mother   . Crohn's disease Mother   . Colon polyps Mother   . Hyperlipidemia Father   . Hemophilia Father   . COPD Father     smoked  . Hemophilia Son   . Heart attack Maternal Grandfather   . Diabetes Paternal Grandfather   . Stroke Paternal Grandfather   . Heart failure Brother   . Heart failure Paternal Uncle   . Heart failure Paternal Aunt   . COPD Maternal Grandmother     smoked  . Lung cancer Maternal Grandmother     smoked  . Hypertension Neg Hx    Past Surgical History:  Procedure Laterality Date  . CESAREAN SECTION  03-01-2008  . IM PINNING RIGHT ELBOW FX  1992   HARDWARE REMOVED  . LAPAROSCOPY N/A 07/29/2014   Procedure: LAPAROSCOPY DIAGNOSTIC;  Surgeon: Cyril Mourning, MD;  Location: [facility];  Service: Gynecology;  Laterality: N/A;   Social History   Social History  . Marital status: Significant Other    Spouse name: N/A  . Number of children: N/A  . Years of education: N/A   Occupational History  . CNA     Social History Main Topics  . Smoking status: Former Smoker    Packs/day: 0.50    Years: 10.00    Types: Cigarettes    Quit date: 02/13/2015  . Smokeless tobacco: Never Used  . Alcohol use No     Comment: social  . Drug use: No  . Sexual activity: Yes    Birth control/ protection: None   Other Topics Concern  . Not on file   Social History Narrative  . No narrative on file     Review of Systems: General: negative for chills, fever, night sweats or weight changes.  Cardiovascular: negative for  chest pain, dyspnea on exertion, edema, orthopnea, palpitations, paroxysmal nocturnal dyspnea or shortness of breath Dermatological: negative for rash Respiratory: negative for cough or wheezing Urologic: negative for hematuria Abdominal: negative for nausea, vomiting, diarrhea, bright red blood per rectum, melena, or hematemesis Neurologic: negative for visual changes, syncope, or dizziness All other systems reviewed and are otherwise negative except as noted above.   Physical Exam:  Blood pressure 124/82, pulse 81, height 5\' 6"  (1.676 m), weight 258 lb (117 kg).  General appearance: alert, cooperative, no distress and moderately obese Neck: no carotid bruit and no JVD Lungs: clear to auscultation bilaterally Heart: regular rate and rhythm, S1, S2 normal, no murmur, click, rub or gallop Extremities: no  LEE Pulses: 2+ and symmetric Skin: warm and dry Neurologic: Grossly normal  EKG I personally reviewed stress test EKGs. No ischemic changes. official MD review pending.     ASSESSMENT AND PLAN:   1. Chronic systolic CHF - the patient appears euvolemic today, NYHA class II. Lasix only as needed. I will prescribe KCl 20 mEq daily to be taken daily x  Weeks and then with each lasix 40 mg po daily. We discussed continuation of daily weights and low sodium diet. Continue daily Lasix.  2. Chest Pain: Normal exercise treadmill stress test, however ongoing chest pain, previously believed sec to asthma, however now controlled. Calcium score 0, normal coronary CTA.  3. 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   4. Hypertension - well controlled. Continue the same regimen.  5. Acute sinusitis - start z-pack    Ena Dawley , MD 11/11/2016 9:25 AM

## 2016-11-28 ENCOUNTER — Telehealth: Payer: Self-pay | Admitting: *Deleted

## 2016-11-28 DIAGNOSIS — E876 Hypokalemia: Secondary | ICD-10-CM

## 2016-11-28 NOTE — Telephone Encounter (Signed)
Patient left a voicemail stating that she was in last month and her potassium level was low. Patient stated that the week after she was here she saw her cardiologist and she was put on potassium pills. Patient stated that she has been on potassium now for about 2 weeks and wants to know if you can order lab work to check her potassium level to make sure that it is not too low or too high at this point?

## 2016-11-28 NOTE — Telephone Encounter (Signed)
Yes orders entered.  Please schedule lab visit.

## 2016-11-28 NOTE — Telephone Encounter (Signed)
Lab appt has been made   

## 2016-12-02 ENCOUNTER — Other Ambulatory Visit: Payer: 59

## 2016-12-03 MED FILL — PANTOPRAZOLE SOD DR 40 MG T: 40 | 30 days supply | Qty: 30 | Fill #2

## 2016-12-03 MED FILL — VENTOLIN HFA 90 MCG INHALER: 108 (90 BAS | 25 days supply | Qty: 18 | Fill #1

## 2016-12-03 MED FILL — SYMBICORT 80-4.5 MCG INH: 80-4.5 | 30 days supply | Qty: 10 | Fill #2

## 2016-12-03 MED FILL — FAMOTIDINE 20 MG TABLET: 20 | 30 days supply | Qty: 30 | Fill #2

## 2016-12-18 ENCOUNTER — Ambulatory Visit (INDEPENDENT_AMBULATORY_CARE_PROVIDER_SITE_OTHER): Payer: 59 | Admitting: Internal Medicine

## 2016-12-18 ENCOUNTER — Encounter: Payer: Self-pay | Admitting: Internal Medicine

## 2016-12-18 DIAGNOSIS — J029 Acute pharyngitis, unspecified: Secondary | ICD-10-CM

## 2016-12-18 NOTE — Progress Notes (Signed)
   Subjective:    Patient ID: Michelle Kane, female    DOB: 07-Jun-1986, 31 y.o.   MRN: [id number]  HPI The patient is a 31 YO female coming in for concerns of strep. Both of her children have been tested positive for strep recently. She is having mild headaches and some sinus pressure. She is not taking allergy medication at this time. She denies cough or SOB. Some neck irritation but the headache is main concern. No ear pain or drainage. No fevers or chills. Having mild nausea as well.   Review of Systems  Constitutional: Positive for appetite change. Negative for activity change, chills, fatigue, fever and unexpected weight change.  HENT: Positive for congestion and postnasal drip. Negative for ear discharge, ear pain, facial swelling, nosebleeds, rhinorrhea, sore throat and trouble swallowing.   Respiratory: Negative.   Cardiovascular: Negative.   Gastrointestinal: Negative.   Musculoskeletal: Negative.   Neurological: Positive for headaches.      Objective:   Physical Exam  Constitutional: She is oriented to person, place, and time. She appears well-developed and well-nourished.  HENT:  Head: Normocephalic and atraumatic.  Right Ear: External ear normal.  Left Ear: External ear normal.  Oropharynx with redness, no drainage  Eyes: EOM are normal.  Neck: Normal range of motion.  Cardiovascular: Normal rate and regular rhythm.   Pulmonary/Chest: Effort normal and breath sounds normal.  Abdominal: Soft.  Musculoskeletal: She exhibits no edema.  Lymphadenopathy:    She has no cervical adenopathy.  Neurological: She is alert and oriented to person, place, and time.  Skin: Skin is warm and dry.   Vitals:   12/18/16 1607  BP: 120/74  Pulse: 100  Resp: 14  Temp: 98.3 F (36.8 C)  TempSrc: Oral  SpO2: 99%  Weight: 258 lb (117 kg)  Height: 5\' 6"  (1.676 m)   Strep test negative in office.     Assessment & Plan:

## 2016-12-18 NOTE — Patient Instructions (Addendum)
The strep test is not positive today so you do not need antibiotics. Keep taking the allegra and consider starting flonase for better control of drainage. You can take tylenol for pain or fever.   This is likely viral and will clear on its own, if symptoms do not clear in 3-4 days call us back and we can do a course of prednisone.  Pharyngitis Pharyngitis is redness, pain, and swelling (inflammation) of your pharynx. What are the causes? Pharyngitis is usually caused by infection. Most of the time, these infections are from viruses (viral) and are part of a cold. However, sometimes pharyngitis is caused by bacteria (bacterial). Pharyngitis can also be caused by allergies. Viral pharyngitis may be spread from person to person by coughing, sneezing, and personal items or utensils (cups, forks, spoons, toothbrushes). Bacterial pharyngitis may be spread from person to person by more intimate contact, such as kissing. What are the signs or symptoms? Symptoms of pharyngitis include:  Sore throat.  Tiredness (fatigue).  Low-grade fever.  Headache.  Joint pain and muscle aches.  Skin rashes.  Swollen lymph nodes.  Plaque-like film on throat or tonsils (often seen with bacterial pharyngitis).  How is this diagnosed? Your health care provider will ask you questions about your illness and your symptoms. Your medical history, along with a physical exam, is often all that is needed to diagnose pharyngitis. Sometimes, a rapid strep test is done. Other lab tests may also be done, depending on the suspected cause. How is this treated? Viral pharyngitis will usually get better in 3-4 days without the use of medicine. Bacterial pharyngitis is treated with medicines that kill germs (antibiotics). Follow these instructions at home:  Drink enough water and fluids to keep your urine clear or pale yellow.  Only take over-the-counter or prescription medicines as directed by your health care  provider: ? If you are prescribed antibiotics, make sure you finish them even if you start to feel better. ? Do not take aspirin.  Get lots of rest.  Gargle with 8 oz of salt water ( tsp of salt per 1 qt of water) as often as every 1-2 hours to soothe your throat.  Throat lozenges (if you are not at risk for choking) or sprays may be used to soothe your throat. Contact a health care provider if:  You have large, tender lumps in your neck.  You have a rash.  You cough up green, yellow-brown, or bloody spit. Get help right away if:  Your neck becomes stiff.  You drool or are unable to swallow liquids.  You vomit or are unable to keep medicines or liquids down.  You have severe pain that does not go away with the use of recommended medicines.  You have trouble breathing (not caused by a stuffy nose). This information is not intended to replace advice given to you by your health care provider. Make sure you discuss any questions you have with your health care provider. Document Released: 07/01/2005 Document Revised: 12/07/2015 Document Reviewed: 03/08/2013 Elsevier Interactive Patient Education  2017 [employer].

## 2016-12-19 IMAGING — CR DG CHEST 2V
2 series · 2 of 2 positions shown · non-contrast
Comparison: 07/27/2011

CLINICAL DATA: Excess fluids, shortness of breath and LEFT side
chest pain for 2 months, history CHF, asthma, tachycardia, enlarged
heart, former smoker

EXAM:
CHEST  2 VIEW

[chest pa]
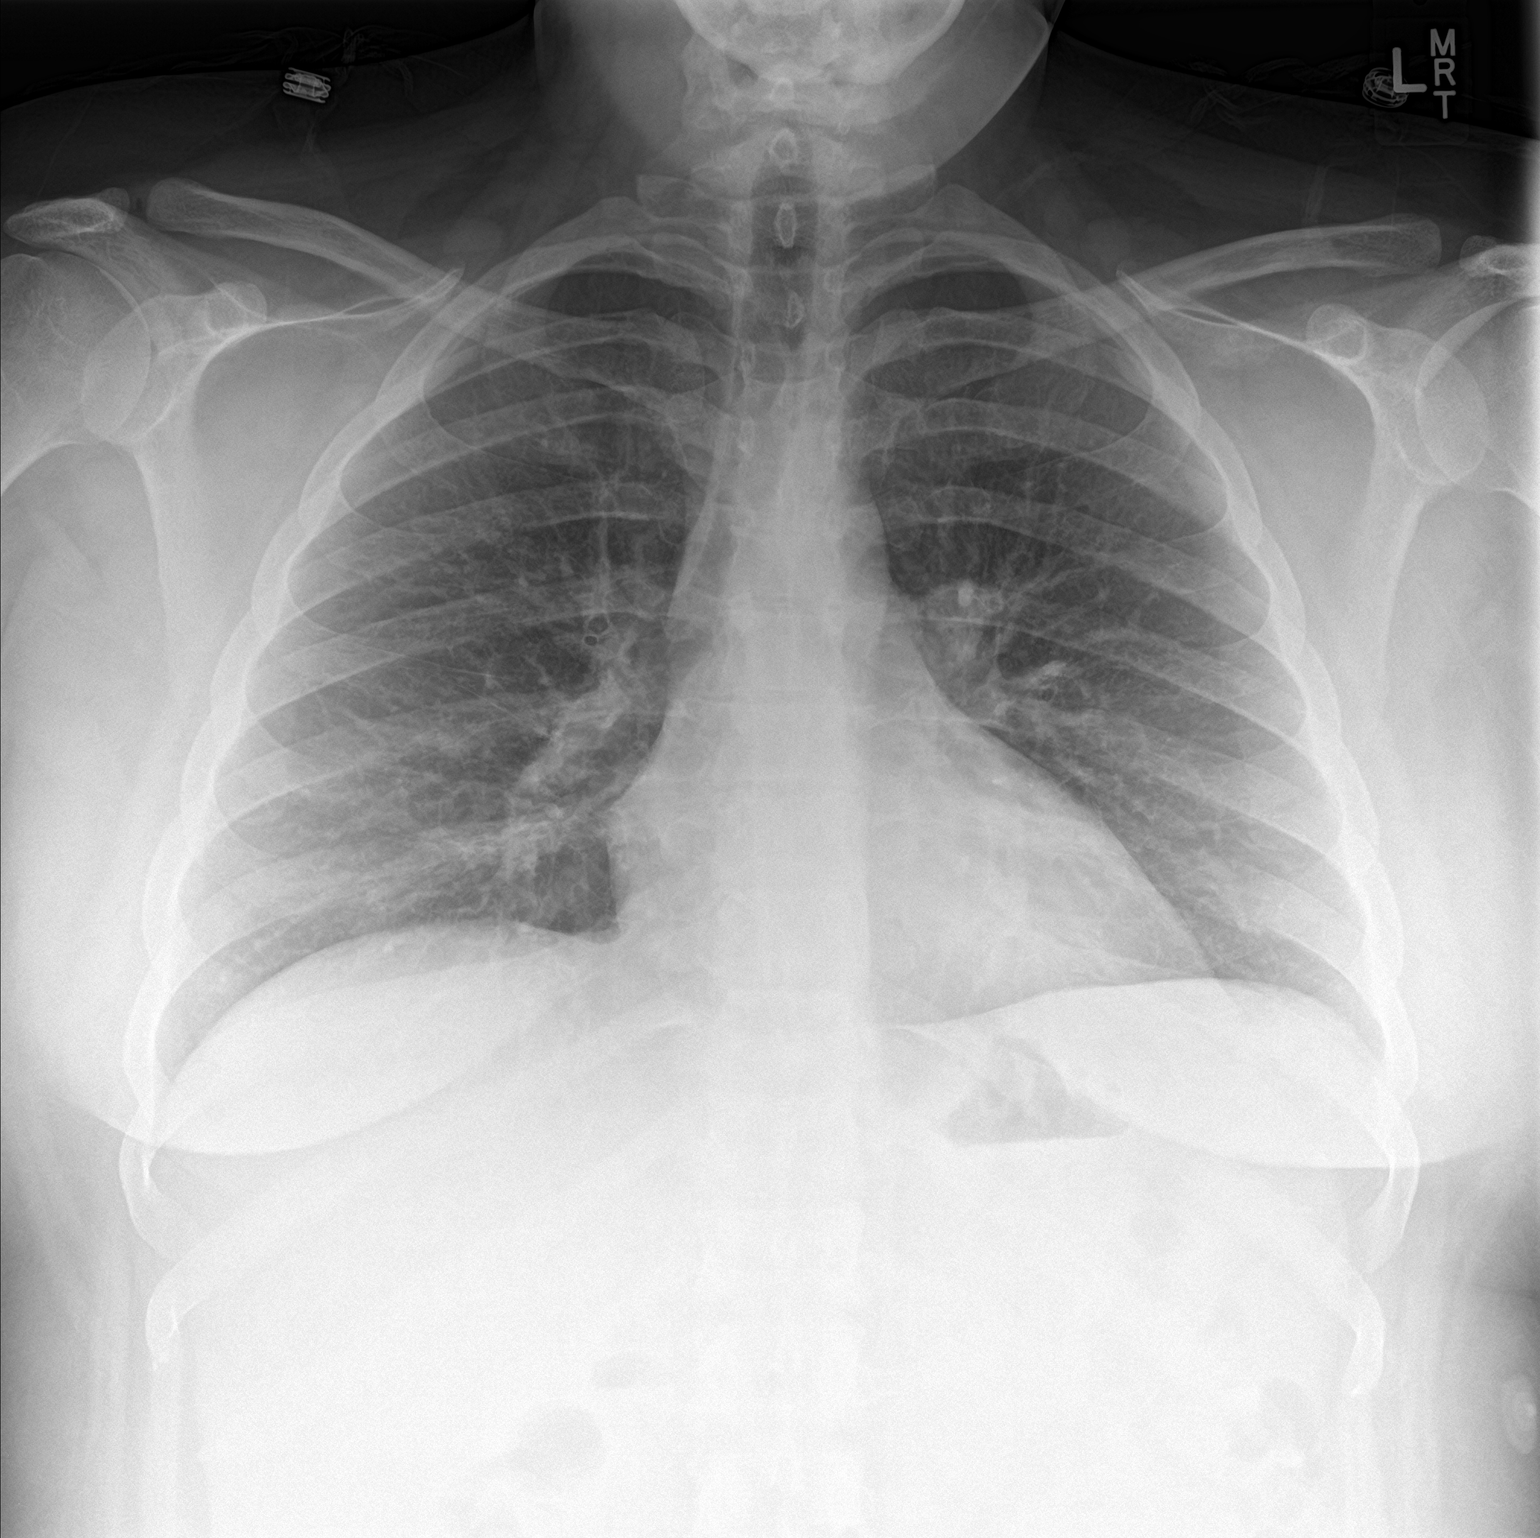

[chest lat]
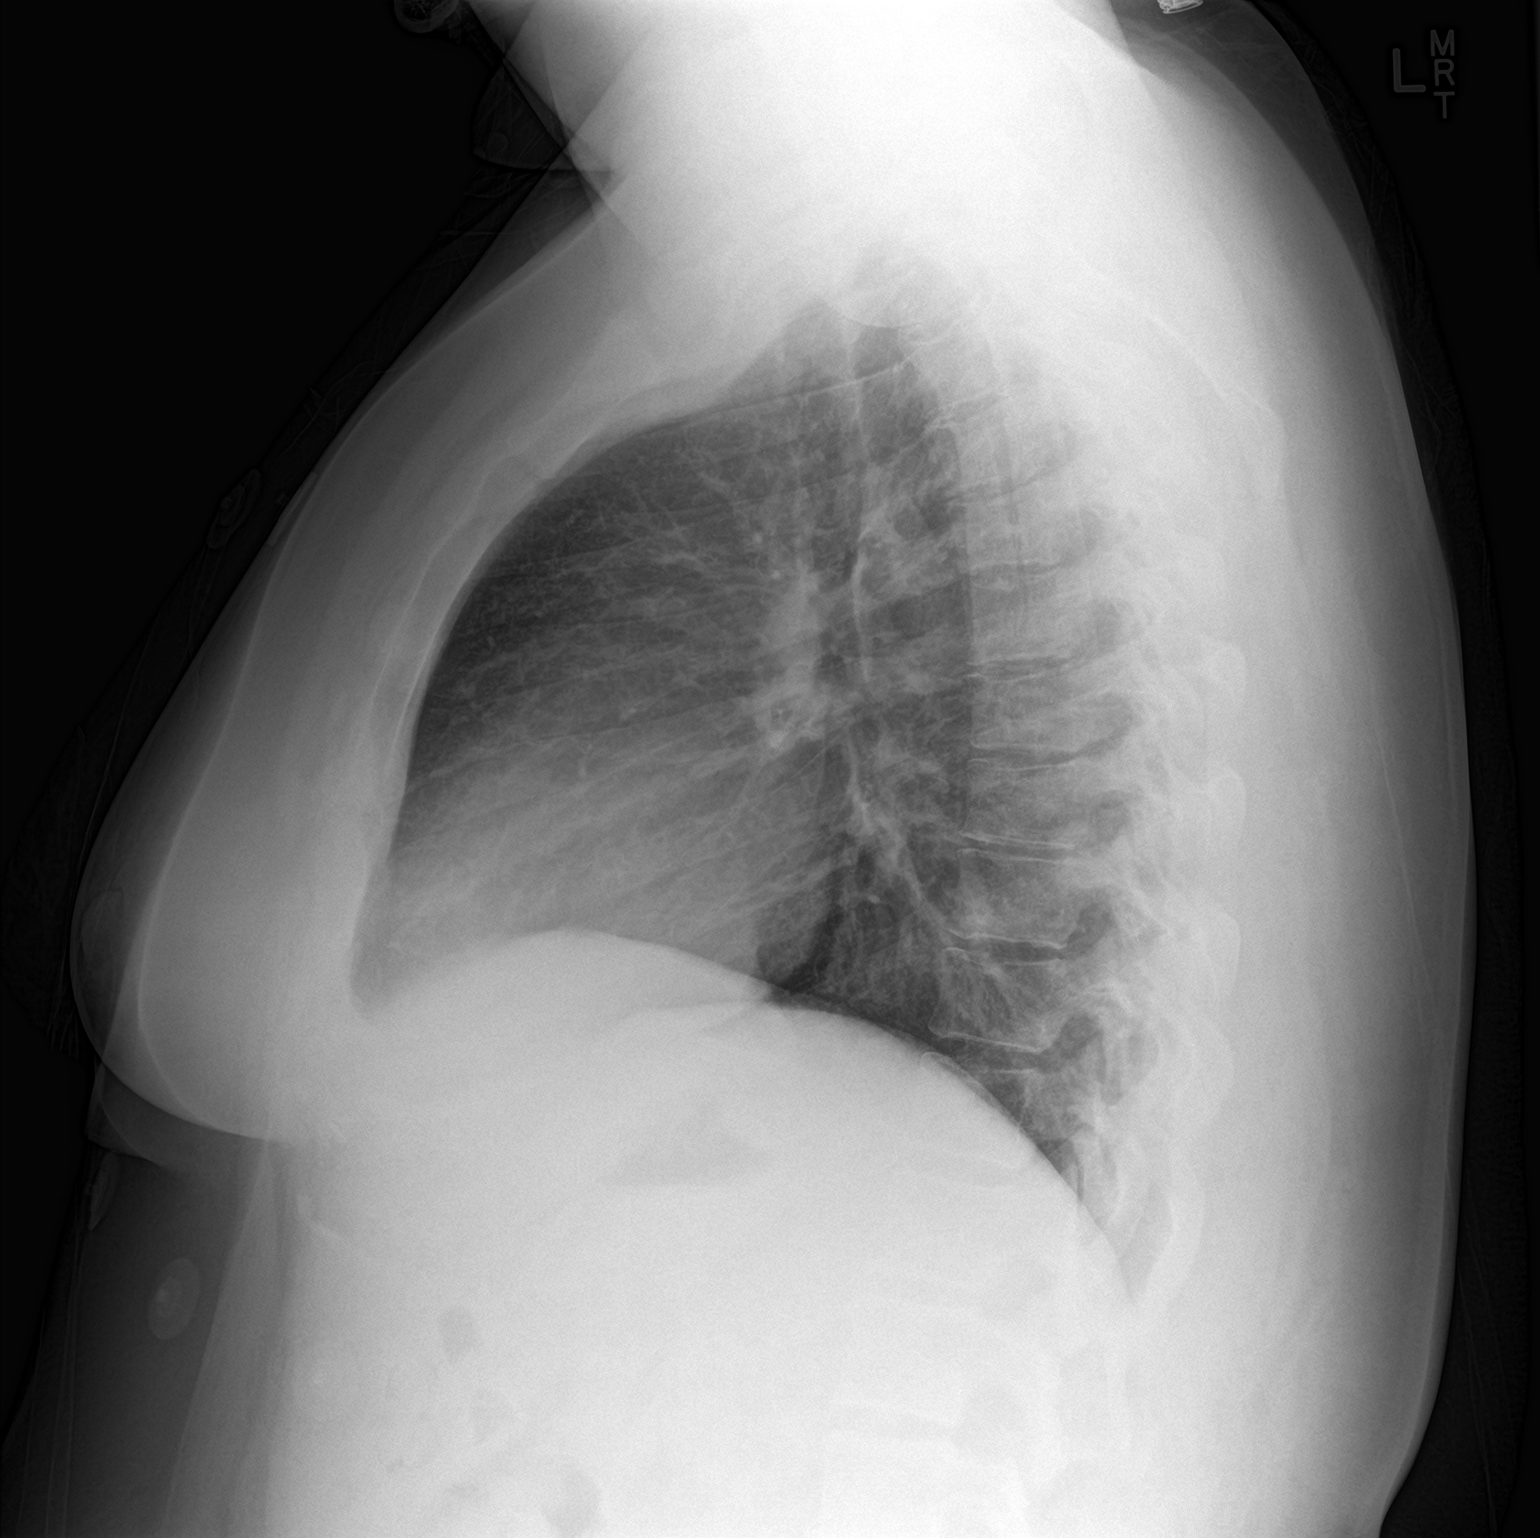

[2 of 2 positions shown; findings below may reference images not displayed]

FINDINGS: Normal heart size, mediastinal contours, and pulmonary vascularity.

Chronic bronchitic changes.

No pulmonary infiltrate, pleural effusion or pneumothorax.

No acute osseous findings.
IMPRESSION: Chronic bronchitic changes without acute infiltrate.

## 2016-12-20 DIAGNOSIS — J029 Acute pharyngitis, unspecified: Secondary | ICD-10-CM | POA: Insufficient documentation

## 2016-12-20 NOTE — Assessment & Plan Note (Signed)
She was tested POC strep in the office although she does not really meet C[facility]OR criteria. This was negative and treatment with antibiotics was not initiated. She is advised to take zyrtec daily for sinus symptoms and this will likely help. Okay to use tylenol for headache or warm shower.

## 2016-12-23 IMAGING — CR DG CHEST 2V
2 series · 2 of 2 positions shown · non-contrast
Comparison: Radiograph dated 07/05/2015

CLINICAL DATA: 29-year-old female with shortness of breath

EXAM:
CHEST  2 VIEW

[w chest pa]
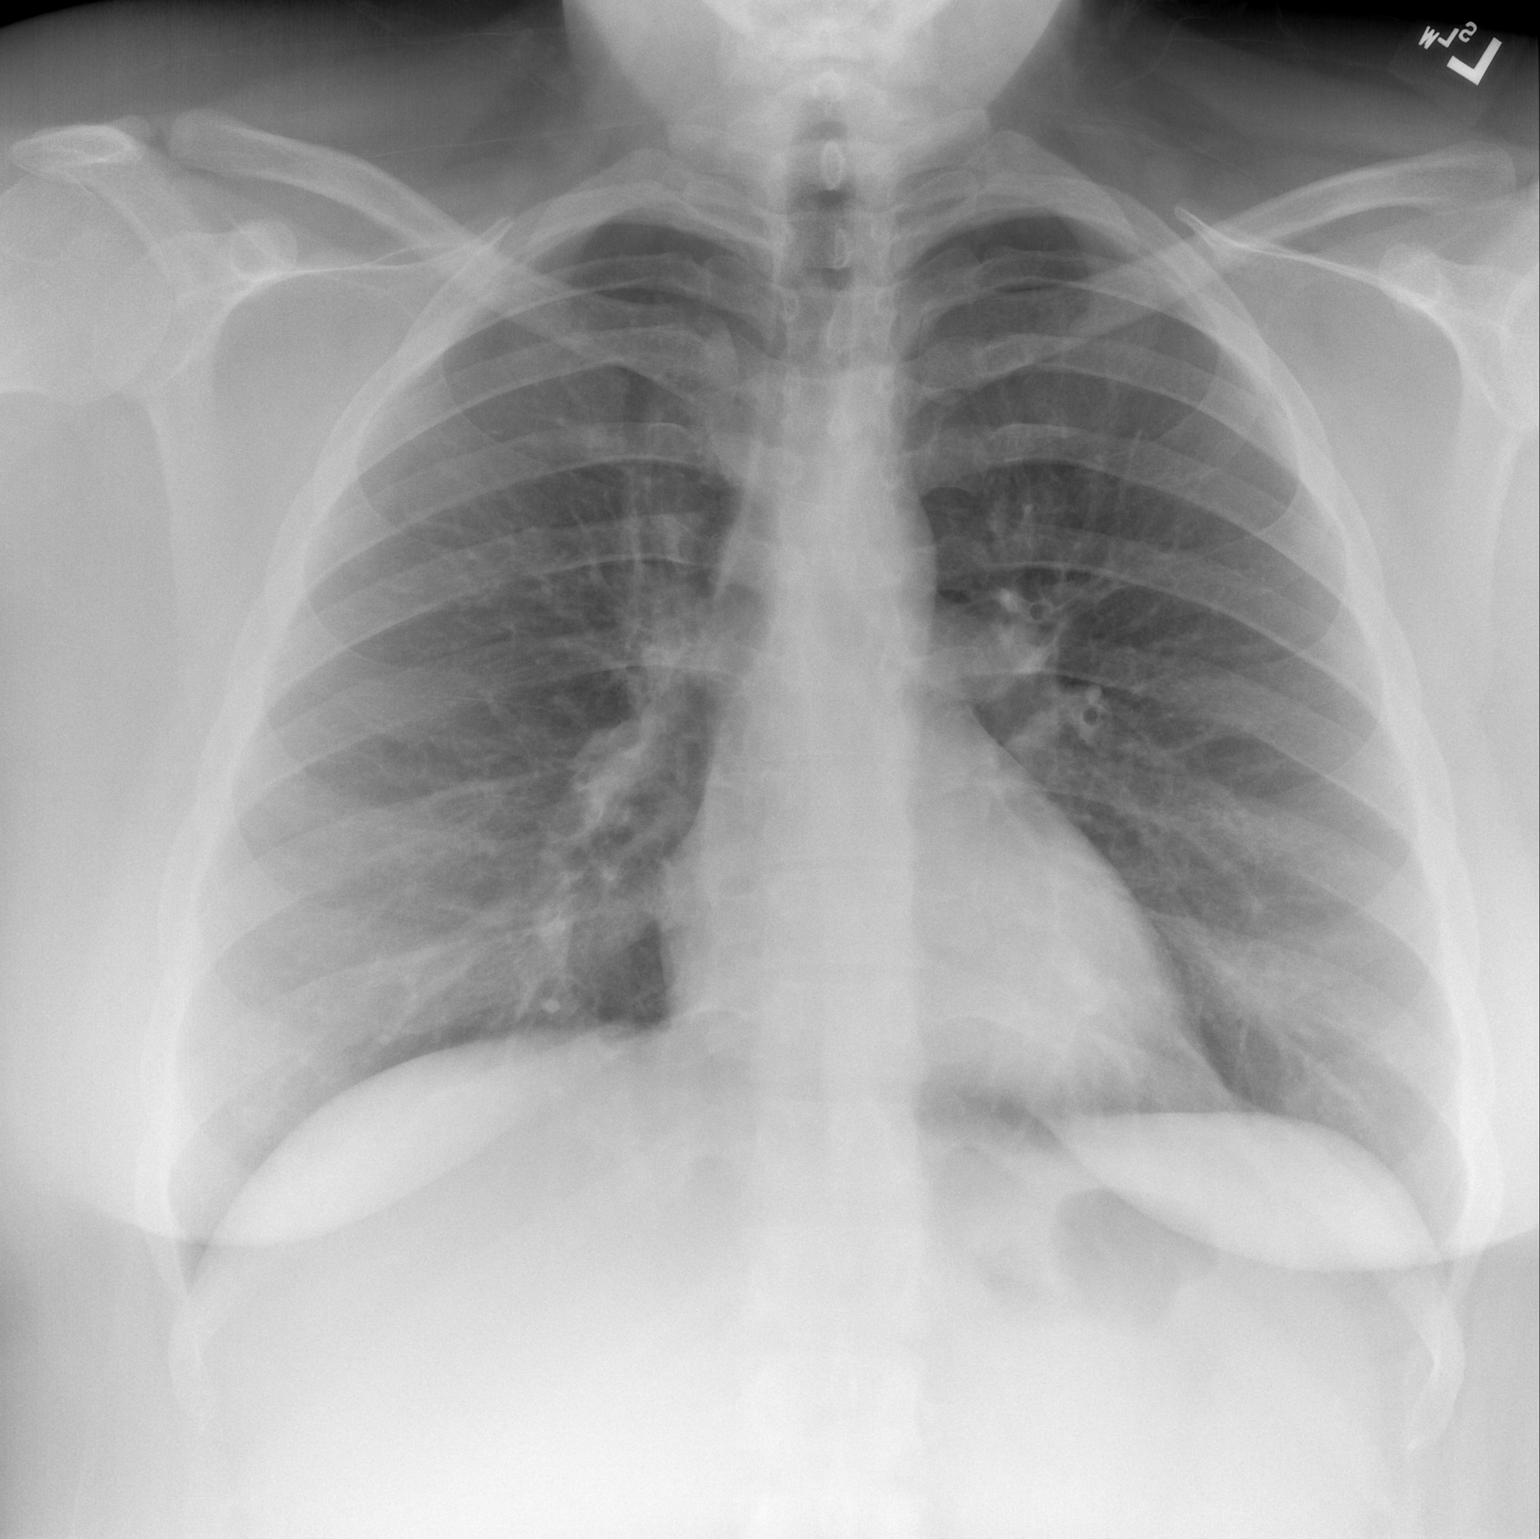

[w chest lat]
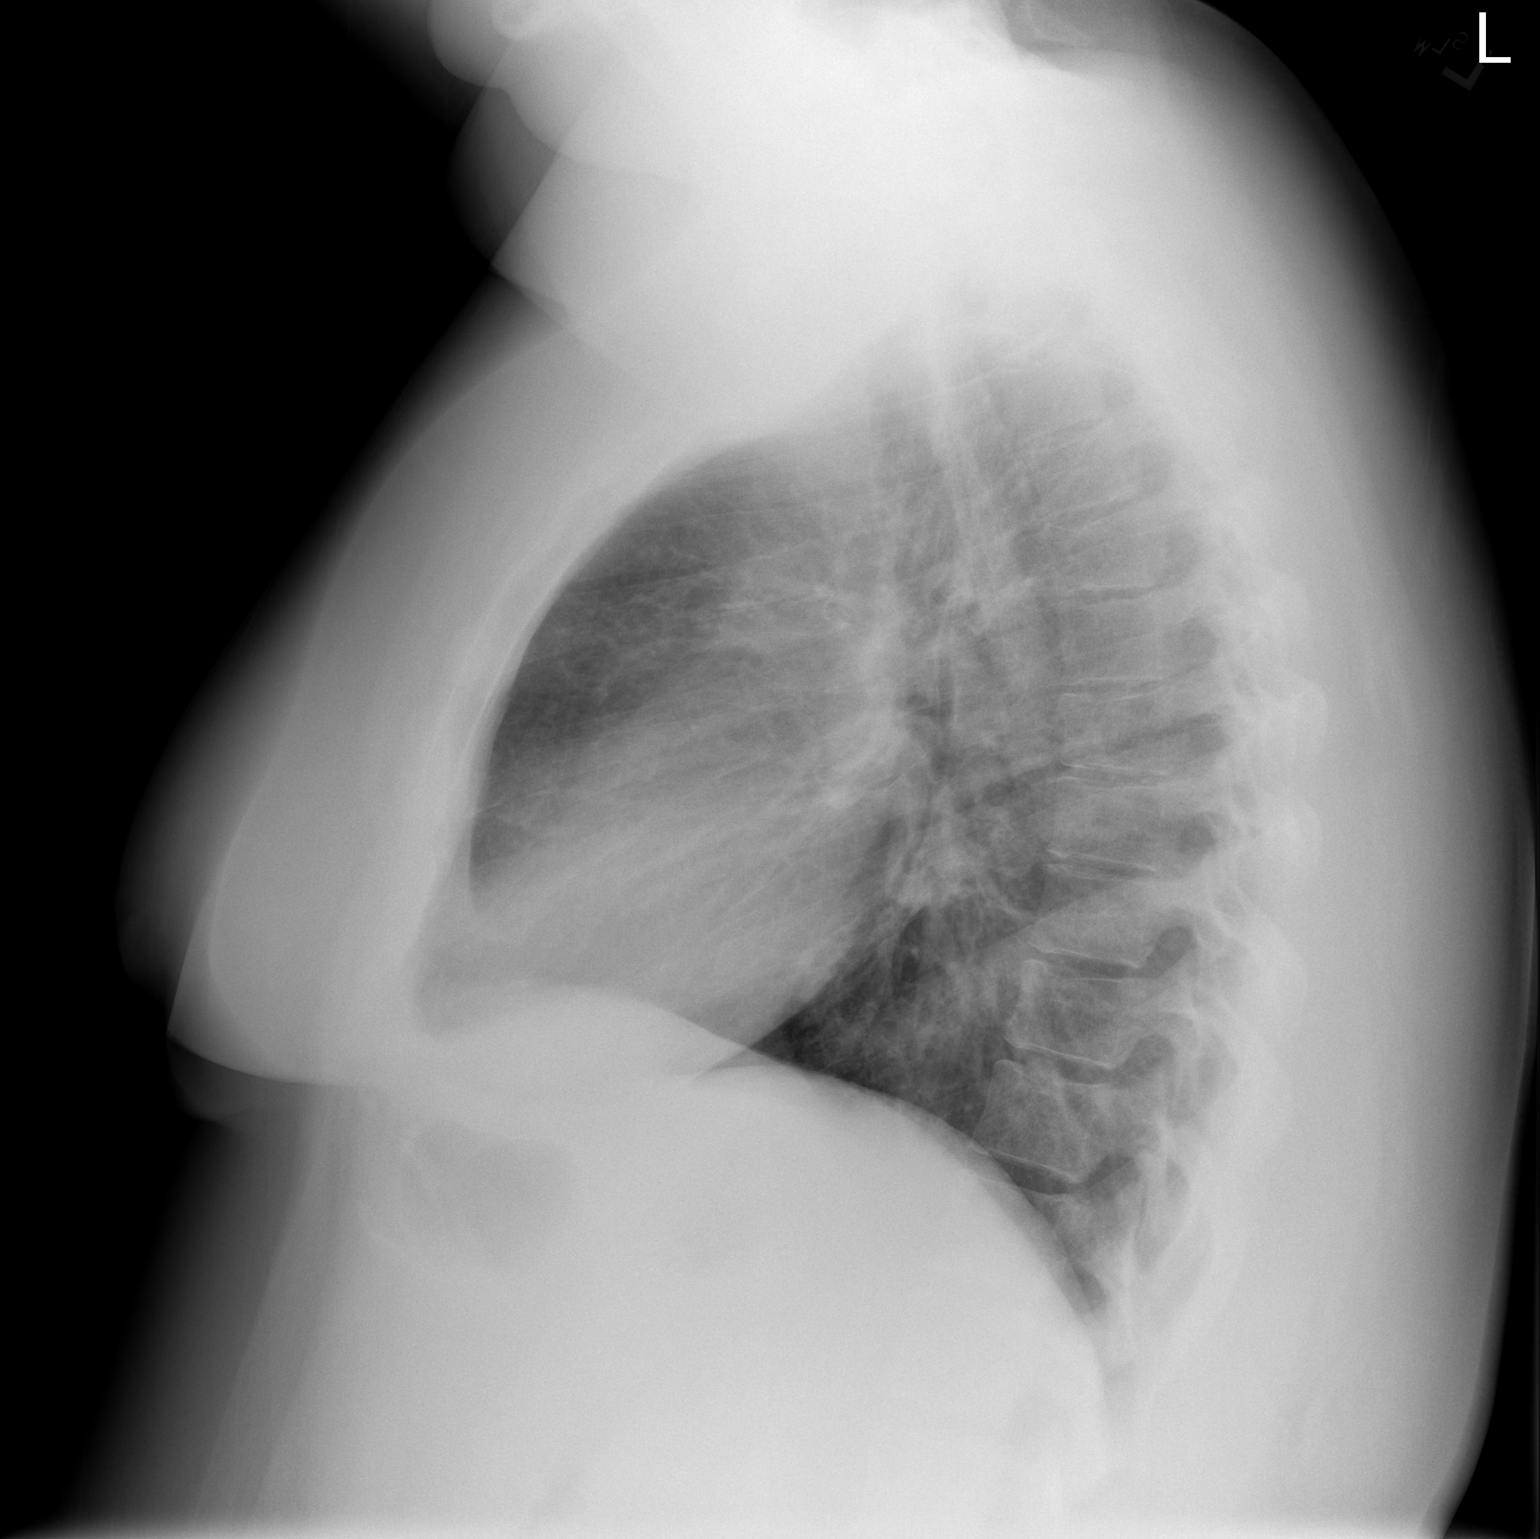

[2 of 2 positions shown; findings below may reference images not displayed]

FINDINGS: The heart size and mediastinal contours are within normal limits.
Both lungs are clear. The visualized skeletal structures are
unremarkable.
IMPRESSION: No active cardiopulmonary disease.

## 2016-12-30 ENCOUNTER — Ambulatory Visit (INDEPENDENT_AMBULATORY_CARE_PROVIDER_SITE_OTHER): Payer: 59 | Admitting: Sports Medicine

## 2016-12-30 ENCOUNTER — Encounter: Payer: Self-pay | Admitting: Sports Medicine

## 2016-12-30 ENCOUNTER — Ambulatory Visit (INDEPENDENT_AMBULATORY_CARE_PROVIDER_SITE_OTHER): Payer: 59

## 2016-12-30 VITALS — BP 118/80 | HR 72 | Ht 66.0 in | Wt 259.8 lb

## 2016-12-30 DIAGNOSIS — M48061 Spinal stenosis, lumbar region without neurogenic claudication: Secondary | ICD-10-CM | POA: Diagnosis not present

## 2016-12-30 DIAGNOSIS — M545 Low back pain: Secondary | ICD-10-CM | POA: Diagnosis not present

## 2016-12-30 DIAGNOSIS — M47816 Spondylosis without myelopathy or radiculopathy, lumbar region: Secondary | ICD-10-CM | POA: Diagnosis not present

## 2016-12-30 DIAGNOSIS — M25562 Pain in left knee: Secondary | ICD-10-CM

## 2016-12-30 MED ORDER — GABAPENTIN 300 MG PO CAPS: ORAL_CAPSULE | ORAL | 1 refills | Status: DC

## 2016-12-30 MED FILL — GABAPENTIN 300 MG CAPSULE: 300 | 37 days supply | Qty: 90 | Fill #0
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
## 2016-12-30 NOTE — Patient Instructions (Addendum)
Please perform the exercise program that Jeneen Rinks has prepared for you and gone over in detail on a daily basis.  In addition to the handout you were provided you can access your program through: [email or web address]   Your unique program codes are: [id number] [facility]  Call if you are not making any significant improvement we can consider putting him on a steroid.

## 2017-01-06 DIAGNOSIS — Z6841 Body Mass Index (BMI) 40.0 and over, adult: Secondary | ICD-10-CM | POA: Diagnosis not present

## 2017-01-06 DIAGNOSIS — Z01419 Encounter for gynecological examination (general) (routine) without abnormal findings: Secondary | ICD-10-CM | POA: Diagnosis not present

## 2017-01-12 DIAGNOSIS — M25562 Pain in left knee: Secondary | ICD-10-CM | POA: Insufficient documentation

## 2017-01-12 DIAGNOSIS — M47816 Spondylosis without myelopathy or radiculopathy, lumbar region: Secondary | ICD-10-CM | POA: Insufficient documentation

## 2017-01-12 NOTE — Assessment & Plan Note (Signed)
This is lumbar spondylosis.  Leg and core strengthening exercises provided.  She does not wish for any further interventions at this time.  If any lack of improvement can consider steroid.  Given that she has she is not a candidate for anti-inflammatories.  Can consider empiric injection as well patient would like to hold off on this.

## 2017-01-16 MED FILL — FAMOTIDINE 20 MG TABLET: 20 | 30 days supply | Qty: 30 | Fill #3

## 2017-01-16 MED FILL — POTASSIUM CL ER 20 MEQ TABL: 20 | 30 days supply | Qty: 30 | Fill #1

## 2017-01-16 MED FILL — PANTOPRAZOLE SOD DR 40 MG T: 40 | 30 days supply | Qty: 30 | Fill #3

## 2017-01-20 DIAGNOSIS — R1032 Left lower quadrant pain: Secondary | ICD-10-CM | POA: Diagnosis not present

## 2017-01-20 DIAGNOSIS — N926 Irregular menstruation, unspecified: Secondary | ICD-10-CM | POA: Diagnosis not present

## 2017-01-21 NOTE — Progress Notes (Signed)
Called and spoke to [place], scheduler to inform Dr. Helane Rima to place orders in [facility] as patient is being scheduled for a pre-op appointment.

## 2017-01-21 NOTE — H&P (Signed)
31 year old female with DUB and left sided pelvic pain. She has had a BTL. Ultrasound in office WNL  Past Medical History:  Diagnosis Date  . Allergy-induced asthma   . Chronic systolic CHF (congestive heart failure) ([place])   . Essential hypertension   . GERD (gastroesophageal reflux disease)   . 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 History of colon polyps   . History of ovarian cyst   . Pelvic pain in female   . Peripartum cardiomyopathy    a. EF 40-45% in 2016, varying by several echoes. F/u echo 06/2015 showed EF 50%.   . Sinus tachycardia    a. 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 b. 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   Past Surgical History:  Procedure Laterality Date  . CESAREAN SECTION  03-01-2008  . IM PINNING RIGHT ELBOW FX  1992   HARDWARE REMOVED  . LAPAROSCOPY N/A 07/29/2014   Procedure: LAPAROSCOPY DIAGNOSTIC;  Surgeon: Cyril Mourning, MD;  Location: [facility];  Service: Gynecology;  Laterality: N/A;   Prior to Admission medications   Medication Sig Start Date End Date Taking? Authorizing Provider  albuterol (PROVENTIL HFA;VENTOLIN HFA) 108 (90 Base) MCG/ACT inhaler Inhale 2 puffs into the lungs every 6 (six) hours as needed for wheezing or shortness of breath. 05/08/16   Lyda Jester M, PA-C  budesonide-formoterol [facility]) 80-4.5 MCG/ACT inhaler 2 pffs at bedtime 06/27/16   Tanda Rockers, MD  diazepam (VALIUM) 2 MG tablet Take 1 tablet (2 mg total) by mouth every 12 (twelve) hours as needed for anxiety. 10/30/16   Lucille Passy, MD  famotidine (PEPCID) 20 MG tablet Take 1 tablet (20 mg total) by mouth at bedtime. 05/08/16   Lyda Jester M, PA-C  furosemide (LASIX) 40 MG tablet TAKE 1 TABLET BY MOUTH TWICE DAILY 10/10/16   [name], MD  gabapentin (NEURONTIN) 300 MG capsule Start with 1 tab po qhs X 1 week, then increase to 1 tab po bid X 1 week then 1 tab po tid prn  12/30/16   Gerda Diss, DO  losartan (COZAAR) 25 MG tablet TAKE 1/2 TABLET BY MOUTH AT BEDTIME 10/10/16   [name], MD  metoprolol (LOPRESSOR) 50 MG tablet Take 1.5 tablets (75 mg total) by mouth 2 (two) times daily. 05/01/16 07/30/16  [name], MD  pantoprazole (PROTONIX) 40 MG tablet Take 1 tablet (40 mg total) by mouth daily. Take 30-60 min before first meal of the day 05/08/16   Lyda Jester M, PA-C  potassium chloride SA (K-DUR,KLOR-CON) 20 MEQ tablet Take 1 tab by mouth daily for one week, then take 1 tab by mouth daily only when you take lasix, thereafter. 11/11/16   [name], MD   Allergies Vicodin  Family History  Problem Relation Age of Onset  . Asthma Mother   . Crohn's disease Mother   . Colon polyps Mother   . Hyperlipidemia Father   . Hemophilia Father   . COPD Father        smoked  . Hemophilia Son   . Heart attack Maternal Grandfather   . Diabetes Paternal Grandfather   . Stroke Paternal Grandfather   . Heart failure Brother   . Heart failure Paternal Uncle   . Heart failure Paternal Aunt   . COPD Maternal Grandmother        smoked  . Lung cancer Maternal Grandmother  smoked  . Hypertension Neg Hx    Social History   Social History  . Marital status: Significant Other    Spouse name: N/A  . Number of children: N/A  . Years of education: N/A   Occupational History  . CNA     Social History Main Topics  . Smoking status: Former Smoker    Packs/day: 0.50    Years: 10.00    Types: Cigarettes    Quit date: 02/13/2015  . Smokeless tobacco: Never Used  . Alcohol use No     Comment: social  . Drug use: No  . Sexual activity: Yes    Birth control/ protection: None   Other Topics Concern  . Not on file   Social History Narrative  . No narrative on file   General alert and oriented Lung CTAB Car RRR Abdomen is soft and non tender Pelvic WNL  IMPRESSION:  DUB Left sided pelvic  pain  PLAN LAVH LSO RISKS REVIEWED CONSENT SIGNED

## 2017-01-30 ENCOUNTER — Encounter (HOSPITAL_COMMUNITY): Payer: 59

## 2017-01-31 ENCOUNTER — Encounter (HOSPITAL_COMMUNITY): Payer: Self-pay

## 2017-01-31 ENCOUNTER — Encounter (HOSPITAL_COMMUNITY)
Admission: RE | Admit: 2017-01-31 | Discharge: 2017-01-31 | Disposition: A | Discharge status: Home / Self Care | Payer: 59 | Source: Ambulatory Visit | Attending: Obstetrics and Gynecology | Admitting: Obstetrics and Gynecology

## 2017-01-31 DIAGNOSIS — Z01812 Encounter for preprocedural laboratory examination: Secondary | ICD-10-CM | POA: Insufficient documentation

## 2017-01-31 HISTORY — DX: Anxiety disorder, unspecified: F41.9

## 2017-01-31 HISTORY — DX: Headache: R51

## 2017-01-31 HISTORY — DX: Headache, unspecified: R51.9

## 2017-01-31 HISTORY — DX: Other specified postprocedural states: R11.2

## 2017-01-31 HISTORY — DX: Other specified postprocedural states: Z98.890

## 2017-01-31 HISTORY — DX: Malignant (primary) neoplasm, unspecified: C80.1

## 2017-01-31 LAB — BASIC METABOLIC PANEL
ANION GAP: 7 (ref 5–15)
BUN: 8 mg/dL (ref 6–20)
CO2: 23 mmol/L (ref 22–32)
Calcium: 8.5 mg/dL — ABNORMAL LOW (ref 8.9–10.3)
Chloride: 109 mmol/L (ref 101–111)
Creatinine, Ser: 0.79 mg/dL (ref 0.44–1.00)
GFR calc non Af Amer: >60 mL/min (ref >60)
GLUCOSE: 81 mg/dL (ref 65–99)
POTASSIUM: 3.7 mmol/L (ref 3.5–5.1)
Sodium: 139 mmol/L (ref 135–145)

## 2017-01-31 LAB — ABO/RH: ABO/RH(D): A NEG

## 2017-01-31 LAB — CBC
HCT: 37 % (ref 36.0–46.0)
Hemoglobin: 12 g/dL (ref 12.0–15.0)
MCH: 28.3 pg (ref 26.0–34.0)
MCHC: 32.4 g/dL (ref 30.0–36.0)
MCV: 87.3 fL (ref 78.0–100.0)
PLATELETS: 227 10*3/uL (ref 150–400)
RBC: 4.24 MIL/uL (ref 3.87–5.11)
RDW: 14.4 % (ref 11.5–15.5)
WBC: 9.6 10*3/uL (ref 4.0–10.5)

## 2017-01-31 LAB — PREGNANCY, URINE: PREG TEST UR: NEGATIVE

## 2017-01-31 NOTE — Patient Instructions (Addendum)
Michelle Kane  01/31/2017      Your procedure is scheduled on Tuesday 02-04-17  Report to [place] [facility].M.  Call this number if you have problems the morning of surgery:[phone]             OUR ADDRESS IS [address] , WE ARE LOCATED IN [place].                 Take the follow medications with a sip of water am of surgery:  Gabapentin, metoprolol, protonix  Remember:  Do not eat food or drink liquids after midnight.  Take these medicines the morning of surgery with A SIP OF WATER  Do not wear jewelry, make-up or nail polish.  Do not wear lotions, powders, or perfumes, or deoderant.  Do not shave 48 hours prior to surgery.  Men may shave face and neck.  Do not bring valuables to the hospital.  [facility] is not responsible for any belongings or valuables.  Contacts, dentures or bridgework may not be worn into surgery.  Leave your suitcase in the car.  After surgery it may be brought to your room.  For patients admitted to the hospital, discharge time will be determined by your treatment team.   Special instructions:   Please read over the following fact sheets that you were given.    [facility] - Preparing for Surgery Before surgery, you can play an important role.  Because skin is not sterile, your skin needs to be as free of germs as possible.  You can reduce the number of germs on your skin by washing with CHG (chlorahexidine gluconate) soap before surgery.  CHG is an antiseptic cleaner which kills germs and bonds with the skin to continue killing germs even after washing. Please DO NOT use if you have an allergy to CHG or antibacterial soaps.  If your skin becomes reddened/irritated stop using the CHG and inform your nurse when you arrive at Short Stay. Do not shave (including legs and underarms) for at least 48 hours prior to the first CHG shower.  You may shave your face/neck. Please follow these  instructions carefully:  1.  Shower with CHG Soap the night before surgery and the  morning of Surgery.  2.  If you choose to wash your hair, wash your hair first as usual with your  normal  shampoo.  3.  After you shampoo, rinse your hair and body thoroughly to remove the  shampoo.                           4.  Use CHG as you would any other liquid soap.  You can apply chg directly  to the skin and wash                       Gently with a scrungie or clean washcloth.  5.  Apply the CHG Soap to your body ONLY FROM THE NECK DOWN.   Do not use on face/ open                           Wound or open sores. Avoid contact with eyes, ears mouth and genitals (private parts).  Wash face,  Genitals (private parts) with your normal soap.             6.  Wash thoroughly, paying special attention to the area where your surgery  will be performed.  7.  Thoroughly rinse your body with warm water from the neck down.  8.  DO NOT shower/wash with your normal soap after using and rinsing off  the CHG Soap.                9.  Pat yourself dry with a clean towel.            10.  Wear clean pajamas.            11.  Place clean sheets on your bed the night of your first shower and do not  sleep with pets. Day of Surgery : Do not apply any lotions/deodorants the morning of surgery.  Please wear clean clothes to the hospital/surgery center.  FAILURE TO FOLLOW THESE INSTRUCTIONS MAY RESULT IN THE CANCELLATION OF YOUR SURGERY PATIENT SIGNATURE_________________________________  NURSE SIGNATURE__________________________________  ________________________________________________________________________  WHAT IS A BLOOD TRANSFUSION? Blood Transfusion Information  A transfusion is the replacement of blood or some of its parts. Blood is made up of multiple cells which provide different functions.  Red blood cells carry oxygen and are used for blood loss replacement.  White blood cells fight against  infection.  Platelets control bleeding.  Plasma helps clot blood.  Other blood products are available for specialized needs, such as hemophilia or other clotting disorders. BEFORE THE TRANSFUSION  Who gives blood for transfusions?   Healthy volunteers who are fully evaluated to make sure their blood is safe. This is blood bank blood. Transfusion therapy is the safest it has ever been in the practice of medicine. Before blood is taken from a donor, a complete history is taken to make sure that person has no history of diseases nor engages in risky social behavior (examples are intravenous drug use or sexual activity with multiple partners). The donor's travel history is screened to minimize risk of transmitting infections, such as malaria. The donated blood is tested for signs of infectious diseases, such as HIV and hepatitis. The blood is then tested to be sure it is compatible with you in order to minimize the chance of a transfusion reaction. If you or a relative donates blood, this is often done in anticipation of surgery and is not appropriate for emergency situations. It takes many days to process the donated blood. RISKS AND COMPLICATIONS Although transfusion therapy is very safe and saves many lives, the main dangers of transfusion include:   Getting an infectious disease.  Developing a transfusion reaction. This is an allergic reaction to something in the blood you were given. Every precaution is taken to prevent this. The decision to have a blood transfusion has been considered carefully by your caregiver before blood is given. Blood is not given unless the benefits outweigh the risks. AFTER THE TRANSFUSION  Right after receiving a blood transfusion, you will usually feel much better and more energetic. This is especially true if your red blood cells have gotten low (anemic). The transfusion raises the level of the red blood cells which carry oxygen, and this usually causes an energy  increase.  The nurse administering the transfusion will monitor you carefully for complications. HOME CARE INSTRUCTIONS  No special instructions are needed after a transfusion. You may find your energy is better. Speak with your caregiver about any  limitations on activity for underlying diseases you may have. SEEK MEDICAL CARE IF:   Your condition is not improving after your transfusion.  You develop redness or irritation at the intravenous (IV) site. SEEK IMMEDIATE MEDICAL CARE IF:  Any of the following symptoms occur over the next 12 hours:  Shaking chills.  You have a temperature by mouth above 102 F (38.9 C), not controlled by medicine.  Chest, back, or muscle pain.  People around you feel you are not acting correctly or are confused.  Shortness of breath or difficulty breathing.  Dizziness and fainting.  You get a rash or develop hives.  You have a decrease in urine output.  Your urine turns a dark color or changes to pink, red, or brown. Any of the following symptoms occur over the next 10 days:  You have a temperature by mouth above 102 F (38.9 C), not controlled by medicine.  Shortness of breath.  Weakness after normal activity.  The white part of the eye turns yellow (jaundice).  You have a decrease in the amount of urine or are urinating less often.  Your urine turns a dark color or changes to pink, red, or brown. Document Released: 06/28/2000 Document Revised: 09/23/2011 Document Reviewed: 02/15/2008 [facility] Patient Information 2014 [place], [place].  _______________________________________________________________________

## 2017-01-31 NOTE — Progress Notes (Signed)
LOV Dr. Meda Coffee cards 11/11/16 epic 06/05/16 epic ekg 11/11/16 epic echo 01/03/16 epic CT coronary 08/26/16 epic

## 2017-02-04 ENCOUNTER — Encounter (HOSPITAL_COMMUNITY)
Admission: RE | Disposition: A | Discharge status: Home / Self Care | Payer: Self-pay | Source: Ambulatory Visit | Attending: Obstetrics and Gynecology

## 2017-02-04 ENCOUNTER — Ambulatory Visit (HOSPITAL_BASED_OUTPATIENT_CLINIC_OR_DEPARTMENT_OTHER): Payer: 59 | Admitting: Anesthesiology

## 2017-02-04 ENCOUNTER — Ambulatory Visit (HOSPITAL_BASED_OUTPATIENT_CLINIC_OR_DEPARTMENT_OTHER)
Admission: RE | Admit: 2017-02-04 | Discharge: 2017-02-05 | Disposition: A | Discharge status: Home / Self Care | Payer: 59 | Source: Ambulatory Visit | Attending: Obstetrics and Gynecology | Admitting: Obstetrics and Gynecology

## 2017-02-04 ENCOUNTER — Encounter (HOSPITAL_BASED_OUTPATIENT_CLINIC_OR_DEPARTMENT_OTHER): Payer: Self-pay

## 2017-02-04 DIAGNOSIS — N800 Endometriosis of uterus: Secondary | ICD-10-CM | POA: Insufficient documentation

## 2017-02-04 DIAGNOSIS — Z885 Allergy status to narcotic agent status: Secondary | ICD-10-CM | POA: Insufficient documentation

## 2017-02-04 DIAGNOSIS — I110 Hypertensive heart disease with heart failure: Secondary | ICD-10-CM | POA: Diagnosis not present

## 2017-02-04 DIAGNOSIS — I5023 Acute on chronic systolic (congestive) heart failure: Secondary | ICD-10-CM | POA: Diagnosis not present

## 2017-02-04 DIAGNOSIS — N8312 Corpus luteum cyst of left ovary: Secondary | ICD-10-CM | POA: Diagnosis not present

## 2017-02-04 DIAGNOSIS — Z90710 Acquired absence of both cervix and uterus: Secondary | ICD-10-CM | POA: Diagnosis present

## 2017-02-04 DIAGNOSIS — I5022 Chronic systolic (congestive) heart failure: Secondary | ICD-10-CM | POA: Diagnosis not present

## 2017-02-04 DIAGNOSIS — K219 Gastro-esophageal reflux disease without esophagitis: Secondary | ICD-10-CM | POA: Diagnosis not present

## 2017-02-04 DIAGNOSIS — Z9851 Tubal ligation status: Secondary | ICD-10-CM | POA: Insufficient documentation

## 2017-02-04 DIAGNOSIS — Z87891 Personal history of nicotine dependence: Secondary | ICD-10-CM | POA: Insufficient documentation

## 2017-02-04 DIAGNOSIS — R102 Pelvic and perineal pain: Secondary | ICD-10-CM | POA: Diagnosis not present

## 2017-02-04 DIAGNOSIS — Z79899 Other long term (current) drug therapy: Secondary | ICD-10-CM | POA: Diagnosis not present

## 2017-02-04 DIAGNOSIS — R1032 Left lower quadrant pain: Secondary | ICD-10-CM | POA: Diagnosis not present

## 2017-02-04 DIAGNOSIS — N926 Irregular menstruation, unspecified: Secondary | ICD-10-CM | POA: Diagnosis not present

## 2017-02-04 DIAGNOSIS — N939 Abnormal uterine and vaginal bleeding, unspecified: Secondary | ICD-10-CM | POA: Diagnosis not present

## 2017-02-04 HISTORY — PX: OOPHORECTOMY: SHX6387

## 2017-02-04 HISTORY — PX: LAPAROSCOPIC ASSISTED VAGINAL HYSTERECTOMY: SHX5398

## 2017-02-04 LAB — URINALYSIS, ROUTINE W REFLEX MICROSCOPIC
BILIRUBIN URINE: NEGATIVE
GLUCOSE, UA: NEGATIVE mg/dL
Hgb urine dipstick: NEGATIVE
KETONES UR: NEGATIVE mg/dL
Leukocytes, UA: NEGATIVE
NITRITE: NEGATIVE
PH: 5 (ref 5.0–8.0)
PROTEIN: 100 mg/dL — AB
Specific Gravity, Urine: 1.019 (ref 1.005–1.030)

## 2017-02-04 LAB — TYPE AND SCREEN
ABO/RH(D): A NEG
Antibody Screen: NEGATIVE

## 2017-02-04 LAB — BASIC METABOLIC PANEL
ANION GAP: 9 (ref 5–15)
BUN: 11 mg/dL (ref 6–20)
CALCIUM: 8.5 mg/dL — AB (ref 8.9–10.3)
CO2: 25 mmol/L (ref 22–32)
Chloride: 105 mmol/L (ref 101–111)
Creatinine, Ser: 0.84 mg/dL (ref 0.44–1.00)
GFR calc Af Amer: >60 mL/min (ref >60)
GLUCOSE: 140 mg/dL — AB (ref 65–99)
Potassium: 4.2 mmol/L (ref 3.5–5.1)
Sodium: 139 mmol/L (ref 135–145)

## 2017-02-04 LAB — HEMOGLOBIN AND HEMATOCRIT, BLOOD
HCT: 34.1 % — ABNORMAL LOW (ref 36.0–46.0)
Hemoglobin: 11 g/dL — ABNORMAL LOW (ref 12.0–15.0)

## 2017-02-04 SURGERY — HYSTERECTOMY, VAGINAL, LAPAROSCOPY-ASSISTED
Anesthesia: General | Site: Abdomen | Laterality: Left

## 2017-02-04 MED ORDER — KETOROLAC TROMETHAMINE 30 MG/ML IJ SOLN: 30.0000 mg | Freq: Once | INTRAMUSCULAR | Status: DC

## 2017-02-04 MED ORDER — NALOXONE HCL 0.4 MG/ML IJ SOLN: 0.4000 mg | INTRAMUSCULAR | Status: DC | PRN

## 2017-02-04 MED ORDER — DIPHENHYDRAMINE HCL 12.5 MG/5ML PO ELIX: 12.5000 mg | ORAL_SOLUTION | Freq: Four times a day (QID) | ORAL | Status: DC | PRN

## 2017-02-04 MED ORDER — MENTHOL 3 MG MT LOZG: 1.0000 | LOZENGE | OROMUCOSAL | Status: DC | PRN

## 2017-02-04 MED ORDER — SCOPOLAMINE 1 MG/3DAYS TD PT72
MEDICATED_PATCH | TRANSDERMAL | Status: AC
  Filled 2017-02-04: qty 1

## 2017-02-04 MED ORDER — METOPROLOL TARTRATE 25 MG PO TABS
25.0000 mg | ORAL_TABLET | Freq: Two times a day (BID) | ORAL | Status: DC
  Filled 2017-02-04: qty 1

## 2017-02-04 MED ORDER — ONDANSETRON HCL 4 MG/2ML IJ SOLN
INTRAMUSCULAR | Status: DC | PRN
  Administered 2017-02-04: 4 mg via INTRAVENOUS

## 2017-02-04 MED ORDER — SODIUM CHLORIDE 0.9% FLUSH: 9.0000 mL | INTRAVENOUS | Status: DC | PRN

## 2017-02-04 MED ORDER — MOMETASONE FURO-FORMOTEROL FUM 100-5 MCG/ACT IN AERO
2.0000 | INHALATION_SPRAY | Freq: Two times a day (BID) | RESPIRATORY_TRACT | Status: DC
  Administered 2017-02-04 – 2017-02-05 (×2): 2 via RESPIRATORY_TRACT
  Filled 2017-02-04: qty 8.8

## 2017-02-04 MED ORDER — LIDOCAINE 2% (20 MG/ML) 5 ML SYRINGE
INTRAMUSCULAR | Status: AC
  Filled 2017-02-04: qty 5

## 2017-02-04 MED ORDER — MIDAZOLAM HCL 5 MG/5ML IJ SOLN
INTRAMUSCULAR | Status: DC | PRN
  Administered 2017-02-04: 2 mg via INTRAVENOUS

## 2017-02-04 MED ORDER — DIPHENHYDRAMINE HCL 50 MG/ML IJ SOLN: 12.5000 mg | Freq: Four times a day (QID) | INTRAMUSCULAR | Status: DC | PRN

## 2017-02-04 MED ORDER — HYDROMORPHONE 1 MG/ML IV SOLN
INTRAVENOUS | Status: DC
  Administered 2017-02-04: 4.5 mg via INTRAVENOUS
  Administered 2017-02-04: 11:00:00 via INTRAVENOUS
  Administered 2017-02-05 (×2): 0.9 mg via INTRAVENOUS
  Filled 2017-02-04: qty 25

## 2017-02-04 MED ORDER — KETOROLAC TROMETHAMINE 30 MG/ML IJ SOLN
INTRAMUSCULAR | Status: AC
  Filled 2017-02-04: qty 1

## 2017-02-04 MED ORDER — PANTOPRAZOLE SODIUM 40 MG PO TBEC
40.0000 mg | DELAYED_RELEASE_TABLET | Freq: Every day | ORAL | Status: DC
  Administered 2017-02-05: 40 mg via ORAL
  Filled 2017-02-04: qty 1

## 2017-02-04 MED ORDER — PROPOFOL 10 MG/ML IV BOLUS
INTRAVENOUS | Status: AC
  Filled 2017-02-04: qty 40

## 2017-02-04 MED ORDER — LACTATED RINGERS IV SOLN
INTRAVENOUS | Status: DC
  Administered 2017-02-04: 07:00:00 via INTRAVENOUS
  Filled 2017-02-04: qty 1000

## 2017-02-04 MED ORDER — ROCURONIUM BROMIDE 50 MG/5ML IV SOSY
PREFILLED_SYRINGE | INTRAVENOUS | Status: DC | PRN
  Administered 2017-02-04: 10 mg via INTRAVENOUS
  Administered 2017-02-04: 50 mg via INTRAVENOUS

## 2017-02-04 MED ORDER — DEXAMETHASONE SODIUM PHOSPHATE 10 MG/ML IJ SOLN
INTRAMUSCULAR | Status: DC | PRN
  Administered 2017-02-04: 10 mg via INTRAVENOUS

## 2017-02-04 MED ORDER — FENTANYL CITRATE (PF) 100 MCG/2ML IJ SOLN
INTRAMUSCULAR | Status: AC
  Filled 2017-02-04: qty 2

## 2017-02-04 MED ORDER — DEXAMETHASONE SODIUM PHOSPHATE 10 MG/ML IJ SOLN
INTRAMUSCULAR | Status: AC
  Filled 2017-02-04: qty 1

## 2017-02-04 MED ORDER — SUGAMMADEX SODIUM 200 MG/2ML IV SOLN
INTRAVENOUS | Status: DC | PRN
  Administered 2017-02-04: 240 mg via INTRAVENOUS

## 2017-02-04 MED ORDER — FENTANYL CITRATE (PF) 250 MCG/5ML IJ SOLN
INTRAMUSCULAR | Status: AC
  Filled 2017-02-04: qty 5

## 2017-02-04 MED ORDER — GABAPENTIN 300 MG PO CAPS
300.0000 mg | ORAL_CAPSULE | Freq: Every day | ORAL | Status: DC
  Administered 2017-02-04: 300 mg via ORAL
  Filled 2017-02-04: qty 1

## 2017-02-04 MED ORDER — ROCURONIUM BROMIDE 50 MG/5ML IV SOSY
PREFILLED_SYRINGE | INTRAVENOUS | Status: AC
  Filled 2017-02-04: qty 5

## 2017-02-04 MED ORDER — KETOROLAC TROMETHAMINE 30 MG/ML IJ SOLN
INTRAMUSCULAR | Status: DC | PRN
  Administered 2017-02-04: 30 mg via INTRAVENOUS

## 2017-02-04 MED ORDER — DEXTROSE 5 % IV SOLN
3.0000 g | INTRAVENOUS | Status: AC
  Administered 2017-02-04: 3 g via INTRAVENOUS
  Filled 2017-02-04: qty 3000

## 2017-02-04 MED ORDER — IBUPROFEN 200 MG PO TABS: 600.0000 mg | ORAL_TABLET | Freq: Four times a day (QID) | ORAL | Status: DC | PRN

## 2017-02-04 MED ORDER — FENTANYL CITRATE (PF) 100 MCG/2ML IJ SOLN
25.0000 ug | INTRAMUSCULAR | Status: DC | PRN
  Administered 2017-02-04 (×3): 50 ug via INTRAVENOUS
  Filled 2017-02-04: qty 1

## 2017-02-04 MED ORDER — LOSARTAN POTASSIUM 25 MG PO TABS
12.5000 mg | ORAL_TABLET | Freq: Every day | ORAL | Status: DC
  Administered 2017-02-04: 12.5 mg via ORAL
  Filled 2017-02-04: qty 1

## 2017-02-04 MED ORDER — FENTANYL CITRATE (PF) 100 MCG/2ML IJ SOLN
INTRAMUSCULAR | Status: DC | PRN
  Administered 2017-02-04: 50 ug via INTRAVENOUS
  Administered 2017-02-04: 100 ug via INTRAVENOUS
  Administered 2017-02-04 (×3): 50 ug via INTRAVENOUS
  Administered 2017-02-04: 100 ug via INTRAVENOUS
  Administered 2017-02-04: 50 ug via INTRAVENOUS

## 2017-02-04 MED ORDER — LIDOCAINE 2% (20 MG/ML) 5 ML SYRINGE
INTRAMUSCULAR | Status: DC | PRN
  Administered 2017-02-04: 100 mg via INTRAVENOUS

## 2017-02-04 MED ORDER — TRAMADOL HCL 50 MG PO TABS: 50.0000 mg | ORAL_TABLET | Freq: Four times a day (QID) | ORAL | Status: DC | PRN

## 2017-02-04 MED ORDER — MIDAZOLAM HCL 2 MG/2ML IJ SOLN
INTRAMUSCULAR | Status: AC
  Filled 2017-02-04: qty 2

## 2017-02-04 MED ORDER — SCOPOLAMINE 1 MG/3DAYS TD PT72
MEDICATED_PATCH | TRANSDERMAL | Status: DC | PRN
  Administered 2017-02-04: 1 via TRANSDERMAL

## 2017-02-04 MED ORDER — ACETAMINOPHEN 160 MG/5ML PO SOLN
325.0000 mg | ORAL | Status: DC | PRN
  Filled 2017-02-04: qty 20.3

## 2017-02-04 MED ORDER — OXYCODONE HCL 5 MG/5ML PO SOLN
5.0000 mg | Freq: Once | ORAL | Status: DC | PRN
  Filled 2017-02-04: qty 5

## 2017-02-04 MED ORDER — ALBUTEROL SULFATE (2.5 MG/3ML) 0.083% IN NEBU
2.5000 mg | INHALATION_SOLUTION | Freq: Four times a day (QID) | RESPIRATORY_TRACT | Status: DC | PRN
Start: 2017-02-04 — End: 2017-02-05

## 2017-02-04 MED ORDER — ONDANSETRON HCL 4 MG/2ML IJ SOLN
INTRAMUSCULAR | Status: AC
  Filled 2017-02-04: qty 2

## 2017-02-04 MED ORDER — LACTATED RINGERS IR SOLN
Status: DC | PRN
  Administered 2017-02-04: 3000 mL

## 2017-02-04 MED ORDER — METOPROLOL TARTRATE 50 MG PO TABS
75.0000 mg | ORAL_TABLET | Freq: Once | ORAL | Status: AC
  Administered 2017-02-04: 75 mg via ORAL
  Filled 2017-02-04 (×2): qty 1

## 2017-02-04 MED ORDER — FAMOTIDINE 20 MG PO TABS
20.0000 mg | ORAL_TABLET | Freq: Every day | ORAL | Status: DC
  Administered 2017-02-04: 20 mg via ORAL
  Filled 2017-02-04: qty 1

## 2017-02-04 MED ORDER — SODIUM CHLORIDE 0.9 % IV BOLUS (SEPSIS)
200.0000 mL | Freq: Once | INTRAVENOUS | Status: AC
  Administered 2017-02-04: 200 mL via INTRAVENOUS

## 2017-02-04 MED ORDER — OXYCODONE HCL 5 MG PO TABS
5.0000 mg | ORAL_TABLET | Freq: Once | ORAL | Status: DC | PRN
  Filled 2017-02-04: qty 1

## 2017-02-04 MED ORDER — ACETAMINOPHEN 325 MG PO TABS
325.0000 mg | ORAL_TABLET | ORAL | Status: DC | PRN
  Filled 2017-02-04: qty 2

## 2017-02-04 MED ORDER — METOPROLOL TARTRATE 50 MG PO TABS
50.0000 mg | ORAL_TABLET | Freq: Two times a day (BID) | ORAL | Status: DC
  Filled 2017-02-04: qty 1

## 2017-02-04 MED ORDER — SUGAMMADEX SODIUM 200 MG/2ML IV SOLN
INTRAVENOUS | Status: AC
  Filled 2017-02-04: qty 2

## 2017-02-04 MED ORDER — LACTATED RINGERS IV SOLN
INTRAVENOUS | Status: DC
  Administered 2017-02-04 (×2): via INTRAVENOUS

## 2017-02-04 MED ORDER — BUPIVACAINE HCL (PF) 0.25 % IJ SOLN
INTRAMUSCULAR | Status: DC | PRN
  Administered 2017-02-04: 7 mL

## 2017-02-04 MED ORDER — ONDANSETRON HCL 4 MG/2ML IJ SOLN
4.0000 mg | Freq: Four times a day (QID) | INTRAMUSCULAR | Status: DC | PRN
  Administered 2017-02-04 (×2): 4 mg via INTRAVENOUS
  Filled 2017-02-04 (×2): qty 2

## 2017-02-04 MED ORDER — PROPOFOL 10 MG/ML IV BOLUS
INTRAVENOUS | Status: DC | PRN
  Administered 2017-02-04: 50 mg via INTRAVENOUS
  Administered 2017-02-04: 200 mg via INTRAVENOUS

## 2017-02-04 MED ORDER — CEFAZOLIN SODIUM-DEXTROSE 2-4 GM/100ML-% IV SOLN
INTRAVENOUS | Status: AC
  Filled 2017-02-04: qty 100

## 2017-02-04 MED ORDER — PROMETHAZINE HCL 25 MG/ML IJ SOLN
12.5000 mg | Freq: Four times a day (QID) | INTRAMUSCULAR | Status: DC | PRN
  Administered 2017-02-04: 12.5 mg via INTRAVENOUS
  Filled 2017-02-04: qty 1

## 2017-02-04 SURGICAL SUPPLY — 53 items
APL SRG 38 LTWT LNG FL B (MISCELLANEOUS)
APPLICATOR ARISTA FLEXITIP XL (MISCELLANEOUS) IMPLANT
BARRIER ADHS 3X4 INTERCEED (GAUZE/BANDAGES/DRESSINGS) IMPLANT
BLADE CLIPPER SURG (BLADE) IMPLANT
CANISTER SUCT 3000ML PPV (MISCELLANEOUS) IMPLANT
CLOTH BEACON ORANGE TIMEOUT ST (SAFETY) ×3 IMPLANT
COVER BACK TABLE 60X90IN (DRAPES) ×3 IMPLANT
COVER MAYO STAND STRL (DRAPES) ×6 IMPLANT
DERMABOND ADVANCED (GAUZE/BANDAGES/DRESSINGS) ×1
DERMABOND ADVANCED .7 DNX12 (GAUZE/BANDAGES/DRESSINGS) ×2 IMPLANT
DRSG COVADERM PLUS 2X2 (GAUZE/BANDAGES/DRESSINGS) ×6 IMPLANT
DRSG OPSITE POSTOP 3X4 (GAUZE/BANDAGES/DRESSINGS) IMPLANT
DURAPREP 26ML APPLICATOR (WOUND CARE) ×3 IMPLANT
ELECT REM PT RETURN 9FT ADLT (ELECTROSURGICAL) ×3
ELECTRODE REM PT RTRN 9FT ADLT (ELECTROSURGICAL) ×2 IMPLANT
FILTER SMOKE EVAC LAPAROSHD (FILTER) IMPLANT
GLOVE BIO SURGEON STRL SZ 6.5 (GLOVE) ×9 IMPLANT
GLOVE ECLIPSE 6.5 STRL STRAW (GLOVE) ×3 IMPLANT
HEMOSTAT ARISTA ABSORB 3G PWDR (MISCELLANEOUS) IMPLANT
HOLDER FOLEY CATH W/STRAP (MISCELLANEOUS) ×3 IMPLANT
KIT RM TURNOVER CYSTO AR (KITS) ×3 IMPLANT
LEGGING LITHOTOMY PAIR STRL (DRAPES) ×3 IMPLANT
NEEDLE INSUFFLATION 14GA 120MM (NEEDLE) ×3 IMPLANT
NEEDLE INSUFFLATION 14GA 150MM (NEEDLE) IMPLANT
NS IRRIG 500ML POUR BTL (IV SOLUTION) ×3 IMPLANT
PACK LAVH (CUSTOM PROCEDURE TRAY) ×3 IMPLANT
PACK ROBOTIC GOWN (GOWN DISPOSABLE) ×6 IMPLANT
PACK TRENDGUARD 450 HYBRID PRO (MISCELLANEOUS) ×2 IMPLANT
PACK TRENDGUARD 600 HYBRD PROC (MISCELLANEOUS) IMPLANT
PAD OB MATERNITY 4.3X12.25 (PERSONAL CARE ITEMS) ×3 IMPLANT
PAD PREP 24X48 CUFFED NSTRL (MISCELLANEOUS) ×3 IMPLANT
SCISSORS LAP 5X35 DISP (ENDOMECHANICALS) IMPLANT
SEALER TISSUE G2 CVD JAW 45CM (ENDOMECHANICALS) ×3 IMPLANT
SET IRRIG TUBING LAPAROSCOPIC (IRRIGATION / IRRIGATOR) ×3 IMPLANT
SUT VIC AB 0 CT1 18XCR BRD8 (SUTURE) ×4 IMPLANT
SUT VIC AB 0 CT1 36 (SUTURE) ×6 IMPLANT
SUT VIC AB 0 CT1 8-18 (SUTURE) ×4
SUT VIC AB 3-0 PS2 18 (SUTURE) ×2
SUT VIC AB 3-0 PS2 18XBRD (SUTURE) ×2 IMPLANT
SUT VIC AB 3-0 SH 27 (SUTURE)
SUT VIC AB 3-0 SH 27X BRD (SUTURE) IMPLANT
SUT VICRYL 0 TIES 12 18 (SUTURE) ×3 IMPLANT
SUT VICRYL 0 UR6 27IN ABS (SUTURE) ×3 IMPLANT
SYR BULB IRRIGATION 50ML (SYRINGE) IMPLANT
TOWEL OR 17X24 6PK STRL BLUE (TOWEL DISPOSABLE) ×6 IMPLANT
TRAY FOLEY CATH SILVER 14FR (SET/KITS/TRAYS/PACK) ×3 IMPLANT
TRENDGUARD 450 HYBRID PRO PACK (MISCELLANEOUS) ×3
TRENDGUARD 600 HYBRID PROC PK (MISCELLANEOUS)
TROCAR OPTI TIP 5M 100M (ENDOMECHANICALS) ×3 IMPLANT
TROCAR XCEL DIL TIP R 11M (ENDOMECHANICALS) ×3 IMPLANT
TUBING INSUF HEATED (TUBING) ×3 IMPLANT
WARMER LAPAROSCOPE (MISCELLANEOUS) ×3 IMPLANT
WATER STERILE IRR 500ML POUR (IV SOLUTION) ×3 IMPLANT

## 2017-02-04 NOTE — Brief Op Note (Signed)
02/04/2017  9:04 AM  PATIENT:  Michelle Kane  31 y.o. female  PRE-OPERATIVE DIAGNOSIS: DUB PELVIC PAIN   POST-OPERATIVE DIAGNOSIS:  Same Left ovarian cyst  PROCEDURE:  Procedure(s) with comments: LAPAROSCOPIC ASSISTED VAGINAL HYSTERECTOMY, LEFT SALPINGOOPHORECTOMY (Left) - NEED BED LEFT OOPHORECTOMY (Left)  SURGEON:  Surgeon(s) and Role:    * Dian Queen, MD - Primary    * Marylynn Pearson, MD - Assisting  PHYSICIAN ASSISTANT:   ASSISTANTS: none   ANESTHESIA:   general  EBL:  Total I/O In: 100 [I.V.:100] Out: 250 [Blood:250]  BLOOD ADMINISTERED:none  DRAINS: Urinary Catheter (Foley)   LOCAL MEDICATIONS USED:  NONE  SPECIMEN:  Source of Specimen:  cervix, uterus, left tube and ovary  DISPOSITION OF SPECIMEN:  PATHOLOGY  COUNTS:  YES  TOURNIQUET:  * No tourniquets in log *  DICTATION: .Other Dictation: Dictation Number dictated  PLAN OF CARE: Admit for overnight observation  PATIENT DISPOSITION:  PACU - hemodynamically stable.   Delay start of Pharmacological VTE agent (>24hrs) due to surgical blood loss or risk of bleeding: not applicable

## 2017-02-04 NOTE — Progress Notes (Signed)
No changes Will proceed with LAVH, LSO Risks reviewed  Consent signed

## 2017-02-04 NOTE — Anesthesia Procedure Notes (Signed)
Procedure Name: Intubation Date/Time: 02/04/2017 7:30 AM Performed by: Bethena Roys T Pre-anesthesia Checklist: Patient identified, Emergency Drugs available, Suction available and Patient being monitored Patient Re-evaluated:Patient Re-evaluated prior to induction Oxygen Delivery Method: Circle system utilized Preoxygenation: Pre-oxygenation with 100% oxygen Induction Type: IV induction Ventilation: Mask ventilation without difficulty Laryngoscope Size: Mac and 3 Grade View: Grade I Tube type: Oral Tube size: 7.0 mm Number of attempts: 1 Airway Equipment and Method: Stylet Placement Confirmation: ETT inserted through vocal cords under direct vision,  positive ETCO2 and breath sounds checked- equal and bilateral Secured at: 22 cm Tube secured with: Tape Dental Injury: Teeth and Oropharynx as per pre-operative assessment

## 2017-02-04 NOTE — Progress Notes (Signed)
Patients heart rate continues to run 110-120. Urinary output remains low. H/H has been drawn but no yet resulted. Reported to Candiss Norse RN to call report and update MD when  results available.

## 2017-02-04 NOTE — Anesthesia Preprocedure Evaluation (Addendum)
Anesthesia Evaluation  Patient identified by MRN, date of birth, ID band Patient awake    Reviewed: Allergy & Precautions, NPO status , Patient's Chart, lab work & pertinent test results, reviewed documented beta blocker date and time   History of Anesthesia Complications (+) PONV and history of anesthetic complications  Airway Mallampati: II  TM Distance: >3 FB Neck ROM: Full    Dental  (+) Teeth Intact   Pulmonary former smoker,    breath sounds clear to auscultation       Cardiovascular hypertension, Pt. on medications and Pt. on home beta blockers +CHF   Rhythm:Regular     Neuro/Psych  Headaches, Anxiety    GI/Hepatic Neg liver ROS, GERD  Medicated and Controlled,  Endo/Other  negative endocrine ROS  Renal/GU negative Renal ROS     Musculoskeletal  (+) Arthritis ,   Abdominal   Peds  Hematology negative hematology ROS (+)   Anesthesia Other Findings CHF post pregnancy, weaning lasix, beta blocker for tachy  Reproductive/Obstetrics                            Anesthesia Physical Anesthesia Plan  ASA: II  Anesthesia Plan: General   Post-op Pain Management:    Induction: Intravenous  PONV Risk Score and Plan: 4 or greater and Ondansetron, Dexamethasone, Propofol, Midazolam and Scopolamine patch - Pre-op  Airway Management Planned: Oral ETT  Additional Equipment: None  Intra-op Plan:   Post-operative Plan: Extubation in OR  Informed Consent: I have reviewed the patients History and Physical, chart, labs and discussed the procedure including the risks, benefits and alternatives for the proposed anesthesia with the patient or authorized representative who has indicated his/her understanding and acceptance.   Dental advisory given  Plan Discussed with: CRNA and Surgeon  Anesthesia Plan Comments:         Anesthesia Quick Evaluation

## 2017-02-04 NOTE — Progress Notes (Signed)
Patient arrived to room 1531 from PACU.  Two midline abdominal dressings noted.  Foley intact and attached to left thigh.  Foley bag placed below bladder on bedrail.  Patient stating pain 10/10. Ice chips given to patient.  See doc flowsheet for full assessment. PCA to be started

## 2017-02-04 NOTE — Transfer of Care (Signed)
Immediate Anesthesia Transfer of Care Note  Patient: Michelle Kane  Procedure(s) Performed: Procedure(s) with comments: LAPAROSCOPIC ASSISTED VAGINAL HYSTERECTOMY, LEFT SALPINGOOPHORECTOMY (Left) - NEED BED LEFT OOPHORECTOMY (Left)  Patient Location: PACU  Anesthesia Type:General  Level of Consciousness: drowsy and responds to stimulation  Airway & Oxygen Therapy: Patient Spontanous Breathing and Patient connected to nasal cannula oxygen  Post-op Assessment: Report given to RN  Post vital signs: Reviewed and stable  Last Vitals:  Vitals:   02/04/17 0609 02/04/17 0917  BP: 129/90 (!) 136/96  Pulse: (!) 104 88  Resp: 19 17  Temp: 37.1 C 36.9 C    Last Pain:  Vitals:   02/04/17 0636  TempSrc:   PainSc: 5       Patients Stated Pain Goal: 8 (53/00/51 1021)  Complications: No apparent anesthesia complications

## 2017-02-04 NOTE — Progress Notes (Signed)
Received order from Dr. Lynnette Caffey, Jinny Blossom.  See MAR.

## 2017-02-04 NOTE — Progress Notes (Signed)
Patient doing ok. Pain near foley catheter BP 125/77 (BP Location: Right Arm)   Pulse 77   Temp 98 F (36.7 C) (Oral)   Resp 16   Wt 116.6 kg (257 lb 0.9 oz)   SpO2 98%   BMI 41.49 kg/m  Urine output good Abdomen is soft and non tender Dressings clean and dry  POD # 1 doing well Routine care Remove foley in am

## 2017-02-04 NOTE — Anesthesia Postprocedure Evaluation (Signed)
Anesthesia Post Note  Patient: Michelle Kane  Procedure(s) Performed: Procedure(s) (LRB): LAPAROSCOPIC ASSISTED VAGINAL HYSTERECTOMY, LEFT SALPINGOOPHORECTOMY (Left) LEFT OOPHORECTOMY (Left)     Patient location during evaluation: PACU Anesthesia Type: General Level of consciousness: awake and alert Pain management: pain level controlled Vital Signs Assessment: post-procedure vital signs reviewed and stable Respiratory status: spontaneous breathing, nonlabored ventilation, respiratory function stable and patient connected to nasal cannula oxygen Cardiovascular status: blood pressure returned to baseline and stable Postop Assessment: no signs of nausea or vomiting Anesthetic complications: no    Last Vitals:  Vitals:   02/04/17 1215 02/04/17 1803  BP: 125/77 (!) 105/56  Pulse: 77 98  Resp: 16 16  Temp: 36.7 C 36.6 C    Last Pain:  Vitals:   02/04/17 1803  TempSrc: Axillary  PainSc:                  [name]

## 2017-02-04 NOTE — Progress Notes (Signed)
Spoke with Dr. Helane Rima concerning patients foley.  Inquired about medication for muscle spasms.  Informed that Dilaudid PCA was helping pain.  Stated that if urine output was good then foley can be removed tonight and patient may get up to the bathroom.

## 2017-02-04 NOTE — Progress Notes (Signed)
1800 vital signs  Reflect pulse increasing.  Appears urinary output has been low this afternoon.Pt states she has not had her lasix for today. Dr Lynnette Caffey notified of above and order noted.

## 2017-02-05 ENCOUNTER — Encounter (HOSPITAL_BASED_OUTPATIENT_CLINIC_OR_DEPARTMENT_OTHER): Payer: Self-pay | Admitting: Obstetrics and Gynecology

## 2017-02-05 DIAGNOSIS — I110 Hypertensive heart disease with heart failure: Secondary | ICD-10-CM | POA: Diagnosis not present

## 2017-02-05 DIAGNOSIS — N8312 Corpus luteum cyst of left ovary: Secondary | ICD-10-CM | POA: Diagnosis not present

## 2017-02-05 DIAGNOSIS — K219 Gastro-esophageal reflux disease without esophagitis: Secondary | ICD-10-CM | POA: Diagnosis not present

## 2017-02-05 DIAGNOSIS — Z885 Allergy status to narcotic agent status: Secondary | ICD-10-CM | POA: Diagnosis not present

## 2017-02-05 DIAGNOSIS — N800 Endometriosis of uterus: Secondary | ICD-10-CM | POA: Diagnosis not present

## 2017-02-05 DIAGNOSIS — Z87891 Personal history of nicotine dependence: Secondary | ICD-10-CM | POA: Diagnosis not present

## 2017-02-05 DIAGNOSIS — Z9851 Tubal ligation status: Secondary | ICD-10-CM | POA: Diagnosis not present

## 2017-02-05 DIAGNOSIS — I5022 Chronic systolic (congestive) heart failure: Secondary | ICD-10-CM | POA: Diagnosis not present

## 2017-02-05 DIAGNOSIS — Z79899 Other long term (current) drug therapy: Secondary | ICD-10-CM | POA: Diagnosis not present

## 2017-02-05 MED ORDER — METHOCARBAMOL 500 MG PO TABS: 500.0000 mg | ORAL_TABLET | Freq: Four times a day (QID) | ORAL | Status: DC | PRN

## 2017-02-05 MED ORDER — PROMETHAZINE HCL 25 MG/ML IJ SOLN: 12.5000 mg | Freq: Four times a day (QID) | INTRAMUSCULAR | Status: DC | PRN

## 2017-02-05 MED ORDER — METHOCARBAMOL 500 MG PO TABS: 500.0000 mg | ORAL_TABLET | Freq: Four times a day (QID) | ORAL | 0 refills | Status: DC | PRN

## 2017-02-05 MED ORDER — FUROSEMIDE 40 MG PO TABS
40.0000 mg | ORAL_TABLET | Freq: Two times a day (BID) | ORAL | Status: DC
  Administered 2017-02-05: 40 mg via ORAL
  Filled 2017-02-05: qty 1

## 2017-02-05 MED ORDER — OXYCODONE-ACETAMINOPHEN 7.5-325 MG PO TABS
2.0000 | ORAL_TABLET | Freq: Four times a day (QID) | ORAL | Status: DC | PRN
  Administered 2017-02-05: 2 via ORAL
  Filled 2017-02-05: qty 2

## 2017-02-05 MED ORDER — IBUPROFEN 200 MG PO TABS: 600.0000 mg | ORAL_TABLET | Freq: Four times a day (QID) | ORAL | Status: DC | PRN

## 2017-02-05 MED ORDER — IBUPROFEN 600 MG PO TABS: 600.0000 mg | ORAL_TABLET | Freq: Four times a day (QID) | ORAL | 0 refills | Status: DC | PRN

## 2017-02-05 MED ORDER — OXYCODONE-ACETAMINOPHEN 7.5-325 MG PO TABS: 2.0000 | ORAL_TABLET | Freq: Four times a day (QID) | ORAL | 0 refills | Status: DC | PRN

## 2017-02-05 MED ORDER — METOPROLOL TARTRATE 50 MG PO TABS
75.0000 mg | ORAL_TABLET | Freq: Two times a day (BID) | ORAL | Status: DC
  Administered 2017-02-05: 75 mg via ORAL
  Filled 2017-02-05: qty 2

## 2017-02-05 MED FILL — METHOCARBAMOL 500 MG TABLET: 500 | 7 days supply | Qty: 30 | Fill #0

## 2017-02-05 MED FILL — SYMBICORT 80-4.5 MCG INH: 80-4.5 | 30 days supply | Qty: 10 | Fill #3

## 2017-02-05 MED FILL — IBUPROFEN 600 MG TABLET: 600 | 7 days supply | Qty: 30 | Fill #0

## 2017-02-05 MED FILL — OXYCODONE-APAP 7.5-325 MG: 7.5-325 | 4 days supply | Qty: 30 | Fill #0

## 2017-02-05 NOTE — Progress Notes (Signed)
Discharge and medication instructions reviewed with patient; she verbalizes understanding. Questions answered; patient denies further questions. Pt's father is coming to drive her home. Patient requested to walk to her mother's hospital room on this floor on other wing. Three prescriptions given to patient to have filled. Donne Hazel, RN

## 2017-02-05 NOTE — Discharge Summary (Signed)
Admission Diagnosis: DUB LLQ Pain  Discharge Diagnosis Same  Hospital Course 31 year old female admitted and underwent LAVH, LSO. She did very well post op. By POD # 1 she was ambulating and voiding and eating regular diet. She had stable vital signs  BP (!) 148/75 (BP Location: Right Arm)   Pulse 80   Temp 98.6 F (37 C) (Oral)   Resp 18   Ht 5' 6.75" (1.695 m)   Wt 116.6 kg (257 lb 0.9 oz)   SpO2 100%   BMI 40.56 kg/m  Results for orders placed or performed during the hospital encounter of 02/04/17 (from the past 24 hour(s))  Basic metabolic panel     Status: Abnormal   Collection Time: 02/04/17 10:27 AM  Result Value Ref Range   Sodium 139 135 - 145 mmol/L   Potassium 4.2 3.5 - 5.1 mmol/L   Chloride 105 101 - 111 mmol/L   CO2 25 22 - 32 mmol/L   Glucose, Bld 140 (H) 65 - 99 mg/dL   BUN 11 6 - 20 mg/dL   Creatinine, Ser 0.84 0.44 - 1.00 mg/dL   Calcium 8.5 (L) 8.9 - 10.3 mg/dL   GFR calc non Af Amer >60 >60 mL/min   GFR calc Af Amer >60 >60 mL/min   Anion gap 9 5 - 15  Hemoglobin and hematocrit, blood     Status: Abnormal   Collection Time: 02/04/17  6:31 PM  Result Value Ref Range   Hemoglobin 11.0 (L) 12.0 - 15.0 g/dL   HCT 34.1 (L) 36.0 - 46.0 %  Urinalysis, Routine w reflex microscopic     Status: Abnormal   Collection Time: 02/04/17 11:03 PM  Result Value Ref Range   Color, Urine AMBER (A) YELLOW   APPearance CLOUDY (A) CLEAR   Specific Gravity, Urine 1.019 1.005 - 1.030   pH 5.0 5.0 - 8.0   Glucose, UA NEGATIVE NEGATIVE mg/dL   Hgb urine dipstick NEGATIVE NEGATIVE   Bilirubin Urine NEGATIVE NEGATIVE   Ketones, ur NEGATIVE NEGATIVE mg/dL   Protein, ur 100 (A) NEGATIVE mg/dL   Nitrite NEGATIVE NEGATIVE   Leukocytes, UA NEGATIVE NEGATIVE   RBC / HPF 6-30 0 - 5 RBC/hpf   WBC, UA 6-30 0 - 5 WBC/hpf   Bacteria, UA RARE (A) NONE SEEN   Squamous Epithelial / LPF 0-5 (A) NONE SEEN   Mucous PRESENT    Hyaline Casts, UA PRESENT    Abdomen is soft and non  tender  Incisions are clean and dry  Patient was discharged home in good condition Rx Ibuprofen , Percocet and Robaxin prn  Follow up in 1 week

## 2017-02-06 NOTE — Op Note (Signed)
Michelle Kane, BEAUCHAINE NO.:  [id number]  MEDICAL RECORD NO.:  [id number]  LOCATION:                                 FACILITY:  PHYSICIAN:  Larz Mark L. Georges Victorio, M.D.DATE OF BIRTH:  [birth year]  DATE OF PROCEDURE:  02/04/2017 DATE OF DISCHARGE:                              OPERATIVE REPORT   PREOPERATIVE DIAGNOSES:  Pelvic pain, left lower quadrant pain, and abnormal uterine bleeding.  POSTOPERATIVE DIAGNOSES:  Pelvic pain, left lower quadrant pain, and abnormal uterine bleeding.  PROCEDURE:  Laparoscopic-assisted vaginal hysterectomy and left salpingo- oophorectomy.  SURGEON:  Elizbeth Posa L. Helane Rima, M.D.  ASSISTANT:  Marylynn Pearson, MD.  ANESTHESIA:  General.  ESTIMATED BLOOD LOSS:  200 mL.  COMPLICATIONS:  None.  DRAINS:  Foley catheter.  DESCRIPTION OF PROCEDURE:  The patient was taken to the operating room. She was intubated.  She was prepped and draped in usual sterile fashion. A Foley catheter was inserted.  Attention was turned to the abdomen where a small infraumbilical incision was made.  The Veress needle was inserted, and pneumoperitoneum was performed.  The Veress needle was removed, an 11 mm trocar was inserted, and the scope was introduced. There was no area of intestinal injury or bleeding.  The patient was placed in Trendelenburg position.  We then inserted a secondary site under direct visualization with a 5 mm suprapubic port.  The right ovary appeared normal.  She is status post tubal ligation.  Left ovary was enlarged and there did appear to be a possible benign follicular cyst or corpus luteum cyst.  No endometriosis was noted.  No adhesions were noted.  We started with hysterectomy by using a grasper to grasp the right tube and ovary, elevating it, placing the EnSeal across the tube and ovary with the triple pedicle, cauterizing that, cutting it and carrying it down to the round ligament.  This was done with excellent hemostasis.   We went over to the left side, and the decision was made to proceed with left salpingo-oophorectomy.  I elevated the tube and ovary, identified the ureter which was deep in the pelvis, placed the EnSeal across the infundibulopelvic ligament, cauterized that and cut that and carried that down to the round ligament.  Hemostasis was very good.  We then released the pneumoperitoneum, went down to the vagina, placed a weighted speculum in the vagina, made a circumferential incision around the cervix, entered the posterior and anterior cul-de-sacs using Mayo scissors.  The curved Haney clamps were used to clamp the pedicles. Each pedicle was clamped, cut, and suture ligated using 0 Vicryl suture. We walked our way up the broad ligament.  Each pedicle was clamped, cut, and suture ligated using 0 Vicryl suture.  We then retroflexed the uterus and clamped the remainder of the broad ligament on either side. The specimen was removed, identified the cervix, uterus, left tube, and left ovary.  The pedicles were secured using 0 Vicryl suture.  The posterior cuff was closed in a running lock stitch using 0 Vicryl suture.  The cuff was closed completely anterior to posterior in a running lock stitch using 0 Vicryl suture.  We then went back up to  the abdomen, replaced the pneumoperitoneum, irrigated the pelvis with Nezhat suction irrigator and the pelvis was very dry.  Pneumoperitoneum was released.  The trocars were removed.  The skin stitches were closed with 3-0 Vicryl.  Bandages were applied.  All sponge, lap, and instrument counts were correct x2.  Urine output was good, and the urine was clear. The patient went to recovery room in stable condition.     Olivene Cookston L. Helane Rima, M.D.     Nevin Bloodgood  D:  02/06/2017  T:  02/06/2017  Job:  [id number]

## 2017-02-10 ENCOUNTER — Ambulatory Visit: Payer: 59 | Admitting: Sports Medicine

## 2017-02-10 DIAGNOSIS — Z0289 Encounter for other administrative examinations: Secondary | ICD-10-CM

## 2017-02-13 ENCOUNTER — Inpatient Hospital Stay (HOSPITAL_COMMUNITY)
Admission: AD | Admit: 2017-02-13 | Discharge: 2017-02-15 | DRG: 921 | Disposition: A | Discharge status: Home / Self Care | Payer: 59 | Source: Ambulatory Visit | Attending: Obstetrics and Gynecology | Admitting: Obstetrics and Gynecology

## 2017-02-13 DIAGNOSIS — F419 Anxiety disorder, unspecified: Secondary | ICD-10-CM | POA: Diagnosis not present

## 2017-02-13 DIAGNOSIS — Z791 Long term (current) use of non-steroidal anti-inflammatories (NSAID): Secondary | ICD-10-CM | POA: Diagnosis not present

## 2017-02-13 DIAGNOSIS — K219 Gastro-esophageal reflux disease without esophagitis: Secondary | ICD-10-CM | POA: Diagnosis not present

## 2017-02-13 DIAGNOSIS — D649 Anemia, unspecified: Secondary | ICD-10-CM | POA: Diagnosis present

## 2017-02-13 DIAGNOSIS — N99820 Postprocedural hemorrhage and hematoma of a genitourinary system organ or structure following a genitourinary system procedure: Secondary | ICD-10-CM | POA: Diagnosis not present

## 2017-02-13 DIAGNOSIS — Z90710 Acquired absence of both cervix and uterus: Secondary | ICD-10-CM | POA: Diagnosis not present

## 2017-02-13 DIAGNOSIS — Z885 Allergy status to narcotic agent status: Secondary | ICD-10-CM | POA: Diagnosis not present

## 2017-02-13 DIAGNOSIS — R102 Pelvic and perineal pain: Secondary | ICD-10-CM | POA: Diagnosis not present

## 2017-02-13 DIAGNOSIS — N9489 Other specified conditions associated with female genital organs and menstrual cycle: Secondary | ICD-10-CM | POA: Diagnosis present

## 2017-02-13 DIAGNOSIS — N99840 Postprocedural hematoma of a genitourinary system organ or structure following a genitourinary system procedure: Secondary | ICD-10-CM | POA: Diagnosis not present

## 2017-02-13 DIAGNOSIS — I10 Essential (primary) hypertension: Secondary | ICD-10-CM | POA: Diagnosis not present

## 2017-02-13 DIAGNOSIS — D500 Iron deficiency anemia secondary to blood loss (chronic): Secondary | ICD-10-CM | POA: Diagnosis not present

## 2017-02-13 DIAGNOSIS — G8918 Other acute postprocedural pain: Secondary | ICD-10-CM | POA: Diagnosis not present

## 2017-02-13 DIAGNOSIS — Z90721 Acquired absence of ovaries, unilateral: Secondary | ICD-10-CM | POA: Diagnosis not present

## 2017-02-13 DIAGNOSIS — R0602 Shortness of breath: Secondary | ICD-10-CM

## 2017-02-13 DIAGNOSIS — T819XXA Unspecified complication of procedure, initial encounter: Secondary | ICD-10-CM | POA: Diagnosis present

## 2017-02-13 DIAGNOSIS — R0682 Tachypnea, not elsewhere classified: Secondary | ICD-10-CM

## 2017-02-13 DIAGNOSIS — J45909 Unspecified asthma, uncomplicated: Secondary | ICD-10-CM | POA: Diagnosis present

## 2017-02-13 LAB — CBC
HCT: 20.3 % — ABNORMAL LOW (ref 36.0–46.0)
Hemoglobin: 6.6 g/dL — CL (ref 12.0–15.0)
MCH: 28.3 pg (ref 26.0–34.0)
MCHC: 32 g/dL (ref 30.0–36.0)
MCV: 88.3 fL (ref 78.0–100.0)
Platelets: 272 K/uL (ref 150–400)
RBC: 2.3 MIL/uL — ABNORMAL LOW (ref 3.87–5.11)
RDW: 14.9 % (ref 11.5–15.5)
WBC: 7.8 K/uL (ref 4.0–10.5)

## 2017-02-13 MED ORDER — ZOLPIDEM TARTRATE 5 MG PO TABS
5.0000 mg | ORAL_TABLET | Freq: Every evening | ORAL | Status: DC | PRN
  Administered 2017-02-14: 5 mg via ORAL
  Filled 2017-02-13: qty 1

## 2017-02-13 MED ORDER — FUROSEMIDE 40 MG PO TABS
40.0000 mg | ORAL_TABLET | Freq: Two times a day (BID) | ORAL | Status: DC
  Administered 2017-02-14: 40 mg via ORAL
  Filled 2017-02-13 (×3): qty 1

## 2017-02-13 MED ORDER — LOSARTAN POTASSIUM 25 MG PO TABS
12.5000 mg | ORAL_TABLET | Freq: Every day | ORAL | Status: DC
  Administered 2017-02-14 (×2): 12.5 mg via ORAL
  Filled 2017-02-13 (×2): qty 0.5

## 2017-02-13 MED ORDER — ALBUTEROL SULFATE (2.5 MG/3ML) 0.083% IN NEBU
3.0000 mL | INHALATION_SOLUTION | Freq: Four times a day (QID) | RESPIRATORY_TRACT | Status: DC
  Administered 2017-02-14: 3 mL via RESPIRATORY_TRACT
  Filled 2017-02-13: qty 3

## 2017-02-13 MED ORDER — TRAMADOL HCL 50 MG PO TABS
50.0000 mg | ORAL_TABLET | Freq: Four times a day (QID) | ORAL | Status: DC
  Administered 2017-02-14 – 2017-02-15 (×6): 50 mg via ORAL
  Filled 2017-02-13 (×6): qty 1

## 2017-02-13 MED ORDER — METOPROLOL TARTRATE 25 MG PO TABS
75.0000 mg | ORAL_TABLET | Freq: Two times a day (BID) | ORAL | Status: DC
  Administered 2017-02-14 (×3): 75 mg via ORAL
  Filled 2017-02-13 (×4): qty 1

## 2017-02-13 MED ORDER — POTASSIUM CHLORIDE CRYS ER 20 MEQ PO TBCR
20.0000 meq | EXTENDED_RELEASE_TABLET | Freq: Two times a day (BID) | ORAL | Status: DC
  Administered 2017-02-14: 20 meq via ORAL
  Filled 2017-02-13 (×4): qty 1

## 2017-02-13 MED ORDER — IBUPROFEN 600 MG PO TABS
600.0000 mg | ORAL_TABLET | Freq: Four times a day (QID) | ORAL | Status: DC | PRN
  Administered 2017-02-14: 600 mg via ORAL
  Filled 2017-02-13: qty 1

## 2017-02-13 MED ORDER — PANTOPRAZOLE SODIUM 40 MG PO TBEC
40.0000 mg | DELAYED_RELEASE_TABLET | Freq: Every day | ORAL | Status: DC
  Administered 2017-02-14: 40 mg via ORAL
  Filled 2017-02-13: qty 1

## 2017-02-13 MED ORDER — DEXTROSE 5 % IV SOLN
1.0000 g | Freq: Two times a day (BID) | INTRAVENOUS | Status: DC
  Administered 2017-02-13 – 2017-02-14 (×3): 1 g via INTRAVENOUS
  Filled 2017-02-13 (×4): qty 1

## 2017-02-13 MED ORDER — DEXTROSE IN LACTATED RINGERS 5 % IV SOLN
INTRAVENOUS | Status: DC
  Administered 2017-02-13 – 2017-02-14 (×2): via INTRAVENOUS

## 2017-02-13 NOTE — Final Progress Note (Signed)
CRITICAL VALUE ALERT  Critical Value:  hgb 6.6  Date & Time Notied:  2335 Provider Notified: Dr Matthew Saras  Orders Received/Actions taken: no further orders

## 2017-02-13 NOTE — H&P (Signed)
Michelle Kane  DICTATION # [id number] CSN# [id number]   Margarette Asal, MD 02/13/2017 10:36 PM

## 2017-02-14 ENCOUNTER — Observation Stay (HOSPITAL_COMMUNITY): Payer: 59

## 2017-02-14 DIAGNOSIS — I10 Essential (primary) hypertension: Secondary | ICD-10-CM | POA: Diagnosis present

## 2017-02-14 DIAGNOSIS — K219 Gastro-esophageal reflux disease without esophagitis: Secondary | ICD-10-CM | POA: Diagnosis present

## 2017-02-14 DIAGNOSIS — D649 Anemia, unspecified: Secondary | ICD-10-CM | POA: Diagnosis present

## 2017-02-14 DIAGNOSIS — N9489 Other specified conditions associated with female genital organs and menstrual cycle: Secondary | ICD-10-CM | POA: Diagnosis present

## 2017-02-14 DIAGNOSIS — R0602 Shortness of breath: Secondary | ICD-10-CM | POA: Diagnosis not present

## 2017-02-14 DIAGNOSIS — S300XXA Contusion of lower back and pelvis, initial encounter: Secondary | ICD-10-CM | POA: Diagnosis not present

## 2017-02-14 DIAGNOSIS — J45909 Unspecified asthma, uncomplicated: Secondary | ICD-10-CM | POA: Diagnosis present

## 2017-02-14 DIAGNOSIS — F419 Anxiety disorder, unspecified: Secondary | ICD-10-CM | POA: Diagnosis present

## 2017-02-14 DIAGNOSIS — N99820 Postprocedural hemorrhage and hematoma of a genitourinary system organ or structure following a genitourinary system procedure: Secondary | ICD-10-CM | POA: Diagnosis present

## 2017-02-14 LAB — CBC
HCT: 20.5 % — ABNORMAL LOW (ref 36.0–46.0)
HEMOGLOBIN: 6.6 g/dL — AB (ref 12.0–15.0)
MCH: 28.4 pg (ref 26.0–34.0)
MCHC: 32.2 g/dL (ref 30.0–36.0)
MCV: 88.4 fL (ref 78.0–100.0)
Platelets: 286 10*3/uL (ref 150–400)
RBC: 2.32 MIL/uL — AB (ref 3.87–5.11)
RDW: 15 % (ref 11.5–15.5)
WBC: 7.4 10*3/uL (ref 4.0–10.5)

## 2017-02-14 LAB — COMPREHENSIVE METABOLIC PANEL
ALBUMIN: 2.8 g/dL — AB (ref 3.5–5.0)
ALK PHOS: 56 U/L (ref 38–126)
ALT: 16 U/L (ref 14–54)
AST: 15 U/L (ref 15–41)
Anion gap: 5 (ref 5–15)
BUN: 10 mg/dL (ref 6–20)
CALCIUM: 8.1 mg/dL — AB (ref 8.9–10.3)
CO2: 24 mmol/L (ref 22–32)
CREATININE: 0.5 mg/dL (ref 0.44–1.00)
Chloride: 109 mmol/L (ref 101–111)
GFR calc Af Amer: >60 mL/min (ref >60)
GFR calc non Af Amer: >60 mL/min (ref >60)
GLUCOSE: 101 mg/dL — AB (ref 65–99)
Potassium: 3.8 mmol/L (ref 3.5–5.1)
SODIUM: 138 mmol/L (ref 135–145)
Total Bilirubin: 0.6 mg/dL (ref 0.3–1.2)
Total Protein: 5.8 g/dL — ABNORMAL LOW (ref 6.5–8.1)

## 2017-02-14 LAB — PREPARE RBC (CROSSMATCH)

## 2017-02-14 MED ORDER — SODIUM CHLORIDE 0.9 % IV SOLN
Freq: Once | INTRAVENOUS | Status: AC
  Administered 2017-02-14: 10:00:00 via INTRAVENOUS

## 2017-02-14 MED ORDER — NON FORMULARY: Freq: Every day | Status: DC

## 2017-02-14 MED ORDER — FUROSEMIDE 40 MG PO TABS
40.0000 mg | ORAL_TABLET | Freq: Two times a day (BID) | ORAL | Status: DC | PRN
  Filled 2017-02-14: qty 1

## 2017-02-14 MED ORDER — IOPAMIDOL (ISOVUE-300) INJECTION 61%
100.0000 mL | Freq: Once | INTRAVENOUS | Status: AC | PRN
  Administered 2017-02-14: 100 mL via INTRAVENOUS

## 2017-02-14 MED ORDER — IOPAMIDOL (ISOVUE-300) INJECTION 61%: 80.0000 mL | Freq: Once | INTRAVENOUS | Status: DC | PRN

## 2017-02-14 MED ORDER — BUDESONIDE-FORMOTEROL FUMARATE 160-4.5 MCG/ACT IN AERO
2.0000 | INHALATION_SPRAY | Freq: Every day | RESPIRATORY_TRACT | Status: DC
  Administered 2017-02-14: 2 via RESPIRATORY_TRACT

## 2017-02-14 MED ORDER — ONDANSETRON HCL 4 MG/2ML IJ SOLN
4.0000 mg | Freq: Four times a day (QID) | INTRAMUSCULAR | Status: DC | PRN
  Administered 2017-02-14: 4 mg via INTRAVENOUS
  Filled 2017-02-14: qty 2

## 2017-02-14 MED ORDER — FAMOTIDINE 20 MG PO TABS
40.0000 mg | ORAL_TABLET | Freq: Every day | ORAL | Status: DC
  Administered 2017-02-14: 40 mg via ORAL
  Filled 2017-02-14: qty 2

## 2017-02-14 MED ORDER — IOPAMIDOL (ISOVUE-300) INJECTION 61%
30.0000 mL | INTRAVENOUS | Status: AC
  Administered 2017-02-14: 30 mL via ORAL

## 2017-02-14 NOTE — Progress Notes (Signed)
S: Patient reports feeling well except mild SOB that she attributes to her asthma - has not had her usual medication in several days. Feels tired. Denies overt abdominal pain - just soreness. VF. Ambulating with dizziness. Tolerating her diet.   O: AFVSS Gen: well appearing, NAD CV: Reg rate Pulm: NWOB, lungs CTAB after breathing tx Abd: soft, minimally ttp, nondistended, moderate ecchymosis over mons pubis that pt says is improving GYN: no blood on pad  CBC    Component Value Date/Time   WBC 7.4 02/14/2017 0543   RBC 2.32 (L) 02/14/2017 0543   HGB 6.6 (LL) 02/14/2017 0543   HCT 20.5 (L) 02/14/2017 0543   PLT 286 02/14/2017 0543   MCV 88.4 02/14/2017 0543   MCH 28.4 02/14/2017 0543   MCHC 32.2 02/14/2017 0543   RDW 15.0 02/14/2017 0543   LYMPHSABS 1.8 10/30/2016 0759   MONOABS 0.4 10/30/2016 0759   EOSABS 0.2 10/30/2016 0759   BASOSABS 0.0 10/30/2016 0759     A/P: 31yo POD#8 s/p LAVH by primary doctor. She was found to have a pelvic hematoma and a hgb of 6.6. Admitted for observation.   # Pelvic hematoma: records on size not yet received from OSH. On exam, pt comf and hgb has been stable over 24 hours. Suspect no active bleeding. CT here w/9cm hematoma and small area that appears to be chronic vs abscess- favor old clot over abscess given AF, well appearing, no s/s infxn.  - Discussed blood transfusion vs observation vs ferriheme given hgb <7. Patient chooses blood transfusion s/s feeling fatigue/SOB. Risks discussed including infection and transfusion reaction - she understands and desires to proceed.  - plan for 2u pRBC, recheck hgb after transfusion completed  # Mild SOB: s/s mild asthma flare vs anemia - CXR WNL - no evidence pneumonia, edema, or atelectasis - encouraged IS - Cont home asthma meds, will add  - improved after breathing treatment, sating 100%  # HTN: well controlled - cont home meds  # Dispo: inpatient  Arty Baumgartner MD

## 2017-02-14 NOTE — H&P (Signed)
NAME:  Michelle Kane, Michelle Kane                    ACCOUNT NO.:  MEDICAL RECORD NO.:  [id number]  LOCATION:                                 FACILITY:  PHYSICIAN:  Ralene Bathe. Matthew Saras, M.D.DATE OF BIRTH:  1986/04/05  DATE OF ADMISSION: DATE OF DISCHARGE:                             HISTORY & PHYSICAL   CHIEF COMPLAINT:  Postoperative bleeding.  HISTORY OF PRESENT ILLNESS:  This is a patient of Dr. Christen Butter, who underwent LAVH and LSO with a 200-mL blood loss, that was dated February 04, 2017.  She has not been in for her postoperative check yet, but presented to the emergency room in [facility] near [place].  Talked to the ED doctor earlier today and advised him to go ahead and do some type of imaging, ultrasound, CT with routine blood work, which was accomplished.  They called me later saying her hemoglobin was 6.8 and ultrasound was consistent with a hematoma.  They felt more comfortable transferring her by [facility] to [facility] since she was stable.  PAST MEDICAL HISTORY:  She has had a tubal in the past, prior to her hysterectomy.  She has a history of GERD, essential hypertension, history of colon polyps, and ovarian cyst and in the past has had peripartum cardiomyopathy.  Other surgical history is cesarean section.  She has had a pinning of her right elbow fracture, previous laparoscopy, and her hysterectomy recently.  MEDICATIONS: 1. Albuterol p.r.n. 2. Symbicort at bedtime. 3. Diazepam p.r.n. anxiety. 4. Pepcid p.r.n. 5. Lasix 40 twice daily. 6. Gabapentin. 7. Losartan. 8. Metoprolol. 9. Pantoprazole. 10.KCl.  FAMILY HISTORY:  Significant for asthma, peptic ulcer, IBS, anemia, gallbladder disease, kidney disease, osteoporosis, diabetes, and unspecified type of cancer.  SOCIAL HISTORY:  Denies alcohol, tobacco, or drug use.  She is single.  PHYSICAL EXAMINATION:  VITAL SIGNS:  Temperature 98.2 and blood pressure 130/78. HEENT:  Unremarkable. NECK:  Supple without  masses. LUNGS:  Clear. CARDIOVASCULAR:  Regular rate and rhythm without murmurs, rubs, gallops. ABDOMEN:  Soft, flat, and positive bowel sounds.  IMAGING:  Pelvic ultrasound exam per the ED, showed consistent with pelvic hematoma.  Hemoglobin 6.8.  No active bleeding reported by the ED physician exam.  IMPRESSION:  Postoperative anemia with pelvic hematoma, currently stable.  PLAN:  We will cover with antibiotics, follow CBC.  Discuss transfusion p.r.n.     Timonthy Hovater M. Matthew Saras, M.D.     [facility]  D:  02/13/2017  T:  02/13/2017  Job:  [id number]

## 2017-02-15 LAB — TYPE AND SCREEN
ABO/RH(D): A NEG
ANTIBODY SCREEN: NEGATIVE
UNIT DIVISION: 0
UNIT DIVISION: 0

## 2017-02-15 LAB — CBC
HEMATOCRIT: 25.3 % — AB (ref 36.0–46.0)
HEMOGLOBIN: 8.3 g/dL — AB (ref 12.0–15.0)
MCH: 28.4 pg (ref 26.0–34.0)
MCHC: 32.8 g/dL (ref 30.0–36.0)
MCV: 86.6 fL (ref 78.0–100.0)
PLATELETS: 245 10*3/uL (ref 150–400)
RBC: 2.92 MIL/uL — AB (ref 3.87–5.11)
RDW: 15.9 % — AB (ref 11.5–15.5)
WBC: 7.9 10*3/uL (ref 4.0–10.5)

## 2017-02-15 LAB — BPAM RBC
BLOOD PRODUCT EXPIRATION DATE: 201808192359
BLOOD PRODUCT EXPIRATION DATE: 201808202359
ISSUE DATE / TIME: 201808031525
ISSUE DATE / TIME: 201808031816
UNIT TYPE AND RH: 600
UNIT TYPE AND RH: 600

## 2017-02-15 MED ORDER — OXYCODONE HCL 5 MG PO TABS: 5.0000 mg | ORAL_TABLET | Freq: Four times a day (QID) | ORAL | 0 refills | Status: DC | PRN

## 2017-02-15 MED ORDER — AMOXICILLIN-POT CLAVULANATE 875-125 MG PO TABS: 1.0000 | ORAL_TABLET | Freq: Two times a day (BID) | ORAL | 0 refills | Status: DC

## 2017-02-15 MED ORDER — IBUPROFEN 600 MG PO TABS: 600.0000 mg | ORAL_TABLET | Freq: Four times a day (QID) | ORAL | 0 refills | Status: DC | PRN

## 2017-02-15 NOTE — Discharge Summary (Signed)
Physician Discharge Summary  Patient ID: Michelle Kane MRN: [id number] DOB/AGE: 1985/07/28 31 y.o.  Admit date: 02/13/2017 Discharge date: 02/15/2017  Admission Diagnoses: Pelvic hematoma  Discharge Diagnoses:  Active Problems:   Post-operative complication   Discharged Condition: good  Hospital Course: 30yo admitted POD#7 s/p LAVH. She was found to have a pelvic hematoma and a hgb of 6.6. Admitted for observation. CT showed 9cm pelvic hematoma with small area that appears to be chronic vs abscess- favor old clot over abscess given AF, well appearing, no s/s infxn. Through-out her hospital course she was well appearing and had no evidence of active bleeding. Her hgb remained stable at 6.6, However, given symptomatic anemia she was transfused 2u pRBC. Her hgb rose appropriately to >8. On HD#3 she was deemed stable enough for d/c.    Consults: None  Significant Diagnostic Studies: labs:  CBC    Component Value Date/Time   WBC 7.9 02/15/2017 0513   RBC 2.92 (L) 02/15/2017 0513   HGB 8.3 (L) 02/15/2017 0513   HCT 25.3 (L) 02/15/2017 0513   PLT 245 02/15/2017 0513   MCV 86.6 02/15/2017 0513   MCH 28.4 02/15/2017 0513   MCHC 32.8 02/15/2017 0513   RDW 15.9 (H) 02/15/2017 0513   LYMPHSABS 1.8 10/30/2016 0759   MONOABS 0.4 10/30/2016 0759   EOSABS 0.2 10/30/2016 0759   BASOSABS 0.0 10/30/2016 0759    Treatments: antibiotics: ancef and blood transfusion  Discharge Exam: Blood pressure 110/83, pulse 83, temperature 98.5 F (36.9 C), temperature source Oral, resp. rate 18, last menstrual period 12/09/2016, SpO2 100 %. General appearance: alert, cooperative and appears stated age Back: no tenderness to percussion or palpation, symmetric, no curvature. ROM normal. No CVA tenderness. Resp: clear to auscultation bilaterally Cardio: regular rate and rhythm, S1, S2 normal, no murmur, click, rub or gallop GI: soft, non-tender; bowel sounds normal; no masses,  no organomegaly and minimal  bruising over mons pubis Incision/Wound: c/d/i  Disposition: 01-Home or Self Care  Discharge Instructions    Call MD for:    Complete by:  As directed    Heavy vaginal bleeding or abnormal vaginal discharge   Call MD for:  difficulty breathing, headache or visual disturbances    Complete by:  As directed    Call MD for:  persistant dizziness or light-headedness    Complete by:  As directed    Call MD for:  persistant nausea and vomiting    Complete by:  As directed    Call MD for:  redness, tenderness, or signs of infection (pain, swelling, redness, odor or green/yellow discharge around incision site)    Complete by:  As directed    Call MD for:  severe uncontrolled pain    Complete by:  As directed    Call MD for:  temperature >100.4    Complete by:  As directed    Diet general    Complete by:  As directed    Driving Restrictions    Complete by:  As directed    Do not drive until you are off narcotic pain medications and you feel like you can react in an emergency.   Increase activity slowly    Complete by:  As directed    Leave dressing on - Keep it clean, dry, and intact until clinic visit    Complete by:  As directed    Lifting restrictions    Complete by:  As directed    Don't lift anything more than 15-20  pounds   Sexual Activity Restrictions    Complete by:  As directed    Nothing in the vagina x 2 to 6 weeks. We will discuss at clinic visit.     Allergies as of 02/15/2017      Reactions   Vicodin [hydrocodone-acetaminophen] Itching, Nausea And Vomiting, Other (See Comments)   GI pains also      Medication List    TAKE these medications   albuterol 108 (90 Base) MCG/ACT inhaler Commonly known as:  PROVENTIL HFA;VENTOLIN HFA Inhale 2 puffs into the lungs every 6 (six) hours as needed for wheezing or shortness of breath.   amoxicillin-clavulanate 875-125 MG tablet Commonly known as:  AUGMENTIN Take 1 tablet by mouth 2 (two) times daily.    budesonide-formoterol 80-4.5 MCG/ACT inhaler Commonly known as:  SYMBICORT 2 pffs at bedtime   diazepam 2 MG tablet Commonly known as:  VALIUM Take 1 tablet (2 mg total) by mouth every 12 (twelve) hours as needed for anxiety.   famotidine 20 MG tablet Commonly known as:  PEPCID Take 1 tablet (20 mg total) by mouth at bedtime.   furosemide 40 MG tablet Commonly known as:  LASIX TAKE 1 TABLET BY MOUTH TWICE DAILY   gabapentin 300 MG capsule Commonly known as:  NEURONTIN Start with 1 tab po qhs X 1 week, then increase to 1 tab po bid X 1 week then 1 tab po tid prn   ibuprofen 600 MG tablet Commonly known as:  ADVIL,MOTRIN Take 1 tablet (600 mg total) by mouth every 6 (six) hours as needed (mild pain). What changed:  Another medication with the same name was added. Make sure you understand how and when to take each.   ibuprofen 600 MG tablet Commonly known as:  ADVIL,MOTRIN Take 1 tablet (600 mg total) by mouth every 6 (six) hours as needed for moderate pain. What changed:  You were already taking a medication with the same name, and this prescription was added. Make sure you understand how and when to take each.   losartan 25 MG tablet Commonly known as:  COZAAR TAKE 1/2 TABLET BY MOUTH AT BEDTIME   methocarbamol 500 MG tablet Commonly known as:  ROBAXIN Take 1 tablet (500 mg total) by mouth every 6 (six) hours as needed for muscle spasms.   metoprolol tartrate 25 MG tablet Commonly known as:  LOPRESSOR Take 75 mg by mouth 2 (two) times daily. What changed:  Another medication with the same name was removed. Continue taking this medication, and follow the directions you see here.   oxyCODONE 5 MG immediate release tablet Commonly known as:  Oxy IR/ROXICODONE Take 1-2 tablets (5-10 mg total) by mouth every 6 (six) hours as needed for severe pain.   pantoprazole 40 MG tablet Commonly known as:  PROTONIX Take 1 tablet (40 mg total) by mouth daily. Take 30-60 min before  first meal of the day   potassium chloride SA 20 MEQ tablet Commonly known as:  K-DUR,KLOR-CON Take 1 tab by mouth daily for one week, then take 1 tab by mouth daily only when you take lasix, thereafter.      fu on Monday with Dr. Helane Rima. Strict return precautions were d/w patient at length.    Signed: Tyson Dense 02/15/2017, 8:38 AM

## 2017-02-16 NOTE — Progress Notes (Deleted)
02/16/2017 [name]   05-20-1986  [id number]  Primary Physician Lucille Passy, MD Primary Cardiologist: Dr. Meda Coffee   Reason for Visit/CC: Atypical Chest Pain  HPI:  Patient is a 31 year old female, followed by Dr. Meda Coffee, who presents to clinic for follow-up. 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 She has a history of cardiomyopathy during pregnancy, also felt possibly familial, GERD, allergy-induced asthma, and inappropriate sinus tachycardia. EF was as low as 40% in the past. Her most recent echocardiogram 01/03/2016 showed normalization of her ejection fraction back to 50-55%. She has been on BB and ARB therapy.   She was recently seen by Dr. Meda Coffee in September with complaints of recurrent tachypalpitations. Her metoprolol was increased from 25 mg BID to 50 mg BID. Dr. Meda Coffee ordered a 48 hr monitor to assess for arrhthymias.   48 hour monitor showed no arrhythmias but frequent sinus tachycardia. Patient was instructed by Dr. Meda Coffee to further increase her Metroprolol to 75 mg twice a day. She was seen in f/u on 10/25 after her metoprolol was increased. She was doing well at that time without any issues. EKG showed normal sinus rhythm with a rate of 89 bpm. EKG show minimal voltage criteria for LVH. Unchanged from previous.  She presents back today with complaint of atypical chest pain and occasional dyspnea. Feels like a knot in her chest. She notes associated upper lip and nose tingling when this occurs. Not worse with exertion. It is pleuritic but not positional. Not worse with meals. No particular pattern. Can occur at random. Can last 1-2 hrs at a time. She also notes dyspnea walking up stairs. She has some occasional wheezing but has not used albuterol, in fear of rebound tachycardia. At the same time, she feels this is not her asthma. She also does not feel this is  reflux. She is on Protonix. She was seen by Dr. Melvyn Novas in January. She had normal PFTs. She was suppose to f/u with  him 4 weeks later but was not able to make the appointment due to her son being hospitalized. She has not been seen by him since.   On 06/05/16 she underwent an exercise treadmill test. Max HR was 164. She had no chest pain during test. No ST changes. 1 PVC. BP in clinic today is 118/82. She held all of her meds for stress test. Resting HR is 90. Currently asymptomatic in exam room.   06/08/2017 - the patient continues to have chest pain on exertion and at rest, they radiate to the back. Sometimes she has just upper back pain on exertion. No palpitations or syncope. She continues to work and is able to finish 12 hour shift. No syncope. No LE edema.  11/11/2016 - patient is coming after 5 months, she denies any chest pain however continues to her and exertional dyspnea, she states Lasix only as needed, and hasn't had significant lower extremity edema orthopnea or personal nocturnal dyspnea. She has been experiencing fever chills with nasal congestion for last 3 days. She has been trying really hard to eat healthy with the help of dietitian exercise however lost 10 pounds and it's stagnating.  Important information her father who has known coronary artery disease status post bypass in the past is currently being diagnosed with decreased LV EF of 20% and is going to undergo another cardiac catheterization in order to determine if he would qualify for vascularization otherwise he will be referred for heart transplant.  02/17/2017 - the patient  was admitted tp [facility] on 02/14/2017 with pelvic hematoma and Hb 6.6, CT here w/9 cm hematoma and small area that appears to be chronic vs abscess- favor old clot over abscess given AF, well appearing, no s/s infxn. She received blood transfusion s/s feeling fatigue/SOB. Repeat Hb 8.3 on 02/15/17.    No outpatient prescriptions have been marked as taking for the 02/17/17 encounter (Appointment) with [name], MD.   Allergies  Allergen Reactions  . Vicodin  [Hydrocodone-Acetaminophen] Itching, Nausea And Vomiting and Other (See Comments)    GI pains also   Past Medical History:  Diagnosis Date  . Allergy-induced asthma    Allergy induced asthma  . Anxiety   . 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 Chronic systolic CHF (congestive heart failure) ([place])   . Essential hypertension    Taken for EF function and tachycardia  . GERD (gastroesophageal reflux disease)   . Headache    migraines  . 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 History of colon polyps   . History of ovarian cyst   . Pelvic pain in female   . Peripartum cardiomyopathy    a. EF 40-45% in 2016, varying by several echoes. F/u echo 06/2015 showed EF 50%.   Marland Kitchen PONV (postoperative nausea and vomiting)   . Sinus tachycardia    a. 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 b. 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   Family History  Problem Relation Age of Onset  . Asthma Mother   . Crohn's disease Mother   . Colon polyps Mother   . Hyperlipidemia Father   . Hemophilia Father   . COPD Father        smoked  . Hemophilia Son   . Heart attack Maternal Grandfather   . Diabetes Paternal Grandfather   . Stroke Paternal Grandfather   . Heart failure Brother   . Heart failure Paternal Uncle   . Heart failure Paternal Aunt   . COPD Maternal Grandmother        smoked  . Lung cancer Maternal Grandmother        smoked  . Hypertension Neg Hx    Past Surgical History:  Procedure Laterality Date  . CESAREAN SECTION  03-01-2008  and 2016  . IM PINNING RIGHT ELBOW FX  1992   HARDWARE REMOVED  . LAPAROSCOPIC ASSISTED VAGINAL HYSTERECTOMY Left 02/04/2017   Procedure: LAPAROSCOPIC ASSISTED VAGINAL HYSTERECTOMY, LEFT SALPINGOOPHORECTOMY;  Surgeon: Dian Queen, MD;  Location: [place];  Service: Gynecology;  Laterality: Left;  NEED BED  . LAPAROSCOPY N/A 07/29/2014   Procedure: LAPAROSCOPY  DIAGNOSTIC;  Surgeon: Cyril Mourning, MD;  Location: [facility];  Service: Gynecology;  Laterality: N/A;  . OOPHORECTOMY Left 02/04/2017   Procedure: LEFT OOPHORECTOMY;  Surgeon: Dian Queen, MD;  Location: [facility];  Service: Gynecology;  Laterality: Left;   Social History   Social History  . Marital status: Significant Other    Spouse name: N/A  . Number of children: N/A  . Years of education: N/A   Occupational History  . CNA     Social History Main Topics  . Smoking status: Former Smoker    Packs/day: 0.50    Years: 10.00    Types: Cigarettes    Quit date: 02/13/2015  . Smokeless tobacco: Never Used  . Alcohol use No     Comment: social  .  Drug use: No  . Sexual activity: Yes    Birth control/ protection: None   Other Topics Concern  . Not on file   Social History Narrative  . No narrative on file     Review of Systems: General: negative for chills, fever, night sweats or weight changes.  Cardiovascular: negative for chest pain, dyspnea on exertion, edema, orthopnea, palpitations, paroxysmal nocturnal dyspnea or shortness of breath Dermatological: negative for rash Respiratory: negative for cough or wheezing Urologic: negative for hematuria Abdominal: negative for nausea, vomiting, diarrhea, bright red blood per rectum, melena, or hematemesis Neurologic: negative for visual changes, syncope, or dizziness All other systems reviewed and are otherwise negative except as noted above.   Physical Exam:  Last menstrual period 12/09/2016.  General appearance: alert, cooperative, no distress and moderately obese Neck: no carotid bruit and no JVD Lungs: clear to auscultation bilaterally Heart: regular rate and rhythm, S1, S2 normal, no murmur, click, rub or gallop Extremities: no LEE Pulses: 2+ and symmetric Skin: warm and dry Neurologic: Grossly normal  EKG I personally reviewed stress test EKGs. No ischemic changes. official  MD review pending.     ASSESSMENT AND PLAN:   1. Chronic systolic CHF - the patient appears euvolemic today, NYHA class II. Lasix only as needed. I will prescribe KCl 20 mEq daily to be taken daily x  Weeks and then with each lasix 40 mg po daily. We discussed continuation of daily weights and low sodium diet. Continue daily Lasix.  2. Chest Pain: Normal exercise treadmill stress test, however ongoing chest pain, previously believed sec to asthma, however now controlled. Calcium score 0, normal coronary CTA.  3. 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   4. Hypertension - well controlled. Continue the same regimen.  5. Acute sinusitis - start z-pack    Ena Dawley , MD 02/16/2017 10:26 PM

## 2017-02-17 ENCOUNTER — Ambulatory Visit: Payer: 59 | Admitting: Cardiology

## 2017-02-17 MED FILL — IBUPROFEN 600 MG TABLET: 600 | 7 days supply | Qty: 30 | Fill #0

## 2017-02-17 MED FILL — traMADol HCL 50 MG TABS: 50 | 30 days supply | Qty: 60 | Fill #0

## 2017-02-17 MED FILL — METOPROLOL TARTRATE 50 MG T: 50 | 90 days supply | Qty: 270 | Fill #1

## 2017-02-17 MED FILL — HYDROmorphone HCL 2 MG TABS: 2 | 10 days supply | Qty: 60 | Fill #0

## 2017-02-18 ENCOUNTER — Encounter: Payer: Self-pay | Admitting: Cardiology

## 2017-02-26 ENCOUNTER — Encounter: Payer: Self-pay | Admitting: Cardiology

## 2017-02-26 DIAGNOSIS — N99840 Postprocedural hematoma of a genitourinary system organ or structure following a genitourinary system procedure: Secondary | ICD-10-CM | POA: Diagnosis not present

## 2017-02-26 DIAGNOSIS — Z09 Encounter for follow-up examination after completed treatment for conditions other than malignant neoplasm: Secondary | ICD-10-CM | POA: Diagnosis not present

## 2017-02-26 MED FILL — PANTOPRAZOLE SOD DR 40 MG T: 40 | 30 days supply | Qty: 30 | Fill #4

## 2017-02-26 MED FILL — FAMOTIDINE 20 MG TABLET: 20 | 30 days supply | Qty: 30 | Fill #4

## 2017-02-26 MED FILL — FUROSEMIDE 40 MG TABLET: 40 | 90 days supply | Qty: 180 | Fill #1

## 2017-02-28 ENCOUNTER — Inpatient Hospital Stay: Admission: RE | Admit: 2017-02-28 | Payer: 59 | Source: Ambulatory Visit

## 2017-03-18 DIAGNOSIS — Z09 Encounter for follow-up examination after completed treatment for conditions other than malignant neoplasm: Secondary | ICD-10-CM | POA: Diagnosis not present

## 2017-03-18 DIAGNOSIS — N99840 Postprocedural hematoma of a genitourinary system organ or structure following a genitourinary system procedure: Secondary | ICD-10-CM | POA: Diagnosis not present

## 2017-03-31 DIAGNOSIS — Z09 Encounter for follow-up examination after completed treatment for conditions other than malignant neoplasm: Secondary | ICD-10-CM | POA: Diagnosis not present

## 2017-03-31 DIAGNOSIS — R102 Pelvic and perineal pain: Secondary | ICD-10-CM | POA: Diagnosis not present

## 2017-04-05 ENCOUNTER — Encounter (HOSPITAL_BASED_OUTPATIENT_CLINIC_OR_DEPARTMENT_OTHER): Payer: Self-pay | Admitting: Emergency Medicine

## 2017-04-05 ENCOUNTER — Emergency Department (HOSPITAL_BASED_OUTPATIENT_CLINIC_OR_DEPARTMENT_OTHER): Payer: 59

## 2017-04-05 ENCOUNTER — Emergency Department (HOSPITAL_BASED_OUTPATIENT_CLINIC_OR_DEPARTMENT_OTHER)
Admission: EM | Admit: 2017-04-05 | Discharge: 2017-04-05 | Disposition: A | Discharge status: Home / Self Care | Payer: 59 | Attending: Emergency Medicine | Admitting: Emergency Medicine

## 2017-04-05 DIAGNOSIS — I5023 Acute on chronic systolic (congestive) heart failure: Secondary | ICD-10-CM | POA: Diagnosis not present

## 2017-04-05 DIAGNOSIS — J45909 Unspecified asthma, uncomplicated: Secondary | ICD-10-CM | POA: Diagnosis not present

## 2017-04-05 DIAGNOSIS — J189 Pneumonia, unspecified organism: Secondary | ICD-10-CM

## 2017-04-05 DIAGNOSIS — J181 Lobar pneumonia, unspecified organism: Secondary | ICD-10-CM | POA: Diagnosis not present

## 2017-04-05 DIAGNOSIS — Z8541 Personal history of malignant neoplasm of cervix uteri: Secondary | ICD-10-CM | POA: Insufficient documentation

## 2017-04-05 DIAGNOSIS — F17210 Nicotine dependence, cigarettes, uncomplicated: Secondary | ICD-10-CM | POA: Diagnosis not present

## 2017-04-05 DIAGNOSIS — Z87891 Personal history of nicotine dependence: Secondary | ICD-10-CM | POA: Diagnosis not present

## 2017-04-05 DIAGNOSIS — R0602 Shortness of breath: Secondary | ICD-10-CM | POA: Diagnosis not present

## 2017-04-05 DIAGNOSIS — I110 Hypertensive heart disease with heart failure: Secondary | ICD-10-CM | POA: Insufficient documentation

## 2017-04-05 DIAGNOSIS — R05 Cough: Secondary | ICD-10-CM | POA: Diagnosis not present

## 2017-04-05 DIAGNOSIS — Z79899 Other long term (current) drug therapy: Secondary | ICD-10-CM | POA: Insufficient documentation

## 2017-04-05 LAB — CBC WITH DIFFERENTIAL/PLATELET
BASOS ABS: 0 10*3/uL (ref 0.0–0.1)
Basophils Relative: 0 %
Eosinophils Absolute: 0.3 10*3/uL (ref 0.0–0.7)
Eosinophils Relative: 2 %
HEMATOCRIT: 40.1 % (ref 36.0–46.0)
Hemoglobin: 13.4 g/dL (ref 12.0–15.0)
LYMPHS PCT: 12 %
Lymphs Abs: 1.5 10*3/uL (ref 0.7–4.0)
MCH: 29.3 pg (ref 26.0–34.0)
MCHC: 33.4 g/dL (ref 30.0–36.0)
MCV: 87.7 fL (ref 78.0–100.0)
MONO ABS: 0.6 10*3/uL (ref 0.1–1.0)
MONOS PCT: 5 %
NEUTROS ABS: 9.8 10*3/uL — AB (ref 1.7–7.7)
Neutrophils Relative %: 81 %
Platelets: 226 10*3/uL (ref 150–400)
RBC: 4.57 MIL/uL (ref 3.87–5.11)
RDW: 16 % — AB (ref 11.5–15.5)
WBC: 12.1 10*3/uL — ABNORMAL HIGH (ref 4.0–10.5)

## 2017-04-05 LAB — BASIC METABOLIC PANEL
ANION GAP: 7 (ref 5–15)
BUN: 6 mg/dL (ref 6–20)
CALCIUM: 8.8 mg/dL — AB (ref 8.9–10.3)
CO2: 22 mmol/L (ref 22–32)
Chloride: 108 mmol/L (ref 101–111)
Creatinine, Ser: 0.58 mg/dL (ref 0.44–1.00)
GFR calc Af Amer: >60 mL/min (ref >60)
GFR calc non Af Amer: >60 mL/min (ref >60)
GLUCOSE: 109 mg/dL — AB (ref 65–99)
Potassium: 3.4 mmol/L — ABNORMAL LOW (ref 3.5–5.1)
Sodium: 137 mmol/L (ref 135–145)

## 2017-04-05 LAB — TROPONIN I: Troponin I: <0.03 ng/mL (ref <0.03)

## 2017-04-05 MED ORDER — AZITHROMYCIN 250 MG PO TABS: 250.0000 mg | ORAL_TABLET | Freq: Every day | ORAL | 0 refills | Status: DC

## 2017-04-05 MED ORDER — IPRATROPIUM-ALBUTEROL 0.5-2.5 (3) MG/3ML IN SOLN
RESPIRATORY_TRACT | Status: AC
  Administered 2017-04-05: 3 mL
  Filled 2017-04-05: qty 3

## 2017-04-05 MED ORDER — ALBUTEROL SULFATE (2.5 MG/3ML) 0.083% IN NEBU
INHALATION_SOLUTION | RESPIRATORY_TRACT | Status: AC
  Administered 2017-04-05: 2.5 mg
  Filled 2017-04-05: qty 3

## 2017-04-05 MED ORDER — AMOXICILLIN 500 MG PO CAPS: 1000.0000 mg | ORAL_CAPSULE | Freq: Three times a day (TID) | ORAL | 0 refills | Status: DC

## 2017-04-05 MED ORDER — AMOXICILLIN 500 MG PO CAPS
1000.0000 mg | ORAL_CAPSULE | Freq: Once | ORAL | Status: AC
  Administered 2017-04-05: 1000 mg via ORAL
  Filled 2017-04-05: qty 2

## 2017-04-05 NOTE — ED Triage Notes (Signed)
SOB since last night, hx of CHF and takes lasix.

## 2017-04-05 NOTE — Discharge Instructions (Signed)
You have a pneumonia. Please take the antibiotics as prescribed. Please take the inhaler as needed. Return to the ER if the breathing gets worse or you are unable to keep any meds down. See your doctor in 1 week.

## 2017-04-05 NOTE — ED Provider Notes (Signed)
[place] DEPT [facility] Provider Note   CSN: [id number] Arrival date & time: 04/05/17  1404     History   Chief Complaint Chief Complaint  Patient presents with  . Shortness of Breath    HPI Michelle Kane is a 31 y.o. female.  HPI Pt comes in with cc of DIB, cough. Pt has been feeling unwell with URI like symptoms for a week, but she started having  worsening of her cough with back pain over the last 3 days. She has hx of allergy inuced asthma, but the nebulizer are not helping. Pt has CHF, but her weight is fine and lasix hasnt helped. Pt is having chills and cough is productive with green phlegm. Pt also has hemophilia - there is no bloody sputum.   Past Medical History:  Diagnosis Date  . Allergy-induced asthma    Allergy induced asthma  . Anxiety   . 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 Chronic systolic CHF (congestive heart failure) ([place])   . Essential hypertension    Taken for EF function and tachycardia  . GERD (gastroesophageal reflux disease)   . Headache    migraines  . 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 History of colon polyps   . History of ovarian cyst   . Pelvic pain in female   . Peripartum cardiomyopathy    a. EF 40-45% in 2016, varying by several echoes. F/u echo 06/2015 showed EF 50%.   Marland Kitchen PONV (postoperative nausea and vomiting)   . Sinus tachycardia    a. 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 b. 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    Patient Active Problem List   Diagnosis Date Noted  . Post-operative complication 23/76/2831  . S/P laparoscopic assisted vaginal hysterectomy (LAVH) 02/04/2017  . Left knee pain 01/12/2017  . Lumbar spondylosis 01/12/2017  . Viral pharyngitis 12/20/2016  . Acute sinusitis 11/11/2016  . Morbid obesity ([place]) 11/11/2016  . Paresthesia 10/30/2016  . Lung nodule 08/26/2016  . Mild persistent asthma without complication 51/76/1607  .  RLS (restless legs syndrome) 12/19/2015  . Anxiety state 12/19/2015  . Essential hypertension 10/09/2015  . Acute on chronic systolic CHF (congestive heart failure) ([place]) 10/09/2015  . Nonischemic congestive cardiomyopathy ([place]) 10/09/2015  . Dyspnea 07/20/2015  . SOB (shortness of breath) 07/12/2015  . Acute congestive heart failure ([place]) 06/06/2015  . Cardiomyopathy ([place]) 05/18/2015  . Hypertension in pregnancy, preeclampsia, severe 05/18/2015  . Inappropriate sinus tachycardia 04/13/2015  . H/O cardiomyopathy 04/13/2015  . H/O cardiovascular disorder 04/13/2015  . Chest pain 01/12/2015  . Palpitations 01/12/2015  . Decreased cardiac ejection fraction 01/12/2015  . Abdominal pain in pregnancy, antepartum   . Chronic female pelvic pain 08/18/2014  . Severe obesity (BMI >= 40) ([place]) 01/12/2013  . Fatty liver 01/12/2013  . Adiposity 01/12/2013  . GERD (gastroesophageal reflux disease)   . Asthma with acute exacerbation 11/27/2011  . Excess, menstruation 11/27/2011    Past Surgical History:  Procedure Laterality Date  . CESAREAN SECTION  03-01-2008  and 2016  . IM PINNING RIGHT ELBOW FX  1992   HARDWARE REMOVED  . LAPAROSCOPIC ASSISTED VAGINAL HYSTERECTOMY Left 02/04/2017   Procedure: LAPAROSCOPIC ASSISTED VAGINAL HYSTERECTOMY, LEFT SALPINGOOPHORECTOMY;  Surgeon: Dian Queen, MD;  Location: [place];  Service: Gynecology;  Laterality: Left;  NEED BED  . LAPAROSCOPY N/A 07/29/2014   Procedure: LAPAROSCOPY DIAGNOSTIC;  Surgeon: Erik Obey  Helane Rima, MD;  Location: [facility];  Service: Gynecology;  Laterality: N/A;  . OOPHORECTOMY Left 02/04/2017   Procedure: LEFT OOPHORECTOMY;  Surgeon: Dian Queen, MD;  Location: [facility];  Service: Gynecology;  Laterality: Left;    OB History    Gravida Para Term Preterm AB Living   4 2 2   1 2    SAB TAB Ectopic Multiple Live Births   1               Home Medications    Prior to  Admission medications   Medication Sig Start Date End Date Taking? Authorizing Provider  albuterol (PROVENTIL HFA;VENTOLIN HFA) 108 (90 Base) MCG/ACT inhaler Inhale 2 puffs into the lungs every 6 (six) hours as needed for wheezing or shortness of breath. 05/08/16   Lyda Jester M, PA-C  amoxicillin (AMOXIL) 500 MG capsule Take 2 capsules (1,000 mg total) by mouth 3 (three) times daily. 04/05/17   Varney Biles, MD  amoxicillin-clavulanate (AUGMENTIN) 875-125 MG tablet Take 1 tablet by mouth 2 (two) times daily. 02/15/17   Tyson Dense, MD  azithromycin (ZITHROMAX) 250 MG tablet Take 1 tablet (250 mg total) by mouth daily. Take first 2 tablets together, then 1 every day until finished. 04/05/17   Varney Biles, MD  budesonide-formoterol [facility]) 80-4.5 MCG/ACT inhaler 2 pffs at bedtime 06/27/16   Tanda Rockers, MD  diazepam (VALIUM) 2 MG tablet Take 1 tablet (2 mg total) by mouth every 12 (twelve) hours as needed for anxiety. 10/30/16   Lucille Passy, MD  famotidine (PEPCID) 20 MG tablet Take 1 tablet (20 mg total) by mouth at bedtime. 05/08/16   Lyda Jester M, PA-C  furosemide (LASIX) 40 MG tablet TAKE 1 TABLET BY MOUTH TWICE DAILY 10/10/16   [name], MD  gabapentin (NEURONTIN) 300 MG capsule Start with 1 tab po qhs X 1 week, then increase to 1 tab po bid X 1 week then 1 tab po tid prn 12/30/16   Gerda Diss, DO  ibuprofen (ADVIL,MOTRIN) 600 MG tablet Take 1 tablet (600 mg total) by mouth every 6 (six) hours as needed (mild pain). Patient not taking: Reported on 02/14/2017 02/05/17   Dian Queen, MD  ibuprofen (ADVIL,MOTRIN) 600 MG tablet Take 1 tablet (600 mg total) by mouth every 6 (six) hours as needed for moderate pain. 02/15/17   Tyson Dense, MD  losartan (COZAAR) 25 MG tablet TAKE 1/2 TABLET BY MOUTH AT BEDTIME 10/10/16   [name], MD  methocarbamol (ROBAXIN) 500 MG tablet Take 1 tablet (500 mg total) by mouth every 6 (six) hours as  needed for muscle spasms. 02/05/17   Dian Queen, MD  metoprolol tartrate (LOPRESSOR) 25 MG tablet Take 75 mg by mouth 2 (two) times daily.    [provider]  oxyCODONE (OXY IR/ROXICODONE) 5 MG immediate release tablet Take 1-2 tablets (5-10 mg total) by mouth every 6 (six) hours as needed for severe pain. 02/15/17   Tyson Dense, MD  pantoprazole (PROTONIX) 40 MG tablet Take 1 tablet (40 mg total) by mouth daily. Take 30-60 min before first meal of the day 05/08/16   Lyda Jester M, PA-C  potassium chloride SA (K-DUR,KLOR-CON) 20 MEQ tablet Take 1 tab by mouth daily for one week, then take 1 tab by mouth daily only when you take lasix, thereafter. 11/11/16   [name], MD    Family History Family History  Problem Relation Age of Onset  .  Asthma Mother   . Crohn's disease Mother   . Colon polyps Mother   . Hyperlipidemia Father   . Hemophilia Father   . COPD Father        smoked  . Hemophilia Son   . Heart attack Maternal Grandfather   . Diabetes Paternal Grandfather   . Stroke Paternal Grandfather   . Heart failure Brother   . Heart failure Paternal Uncle   . Heart failure Paternal Aunt   . COPD Maternal Grandmother        smoked  . Lung cancer Maternal Grandmother        smoked  . Hypertension Neg Hx     Social History Social History  Substance Use Topics  . Smoking status: Former Smoker    Packs/day: 0.50    Years: 10.00    Types: Cigarettes    Quit date: 02/13/2015  . Smokeless tobacco: Never Used  . Alcohol use No     Comment: social     Allergies   Vicodin [hydrocodone-acetaminophen]   Review of Systems Review of Systems  Constitutional: Positive for activity change.  Respiratory: Positive for cough and shortness of breath.   Cardiovascular: Positive for chest pain.  Allergic/Immunologic: Negative for immunocompromised state.  All other systems reviewed and are negative.    Physical Exam Updated Vital Signs BP 115/79    Pulse 99   Temp 98.3 F (36.8 C) (Oral)   Resp 19   Wt 113.9 kg (251 lb 1.7 oz)   LMP 12/09/2016   SpO2 97%   BMI 39.62 kg/m   Physical Exam  Constitutional: She is oriented to person, place, and time. She appears well-developed.  HENT:  Head: Normocephalic and atraumatic.  Eyes: EOM are normal.  Neck: Normal range of motion. Neck supple.  Cardiovascular: Normal rate.   Pulmonary/Chest: Effort normal. No respiratory distress. She has no wheezes. She has no rales.  L sided rhonchi, middle and upper lobe.  Abdominal: Bowel sounds are normal.  Neurological: She is alert and oriented to person, place, and time.  Skin: Skin is warm and dry.  Nursing note and vitals reviewed.    ED Treatments / Results  Labs (all labs ordered are listed, but only abnormal results are displayed) Labs Reviewed  CBC WITH DIFFERENTIAL/PLATELET - Abnormal; Notable for the following:       Result Value   WBC 12.1 (*)    RDW 16.0 (*)    Neutro Abs 9.8 (*)    All other components within normal limits  BASIC METABOLIC PANEL - Abnormal; Notable for the following:    Potassium 3.4 (*)    Glucose, Bld 109 (*)    Calcium 8.8 (*)    All other components within normal limits  TROPONIN I    EKG  EKG Interpretation  Date/Time:  Saturday April 05 2017 14:11:53 EDT Ventricular Rate:  108 PR Interval:  134 QRS Duration: 88 QT Interval:  336 QTC Calculation: 450 R Axis:   63 Text Interpretation:  Sinus tachycardia Otherwise normal ECG No acute changes No significant change since last tracing Confirmed by Varney Biles [id number]) on 04/05/2017 3:07:18 PM       Radiology Dg Chest 2 View  Result Date: 04/05/2017 CLINICAL DATA:  Shortness of breath and cough EXAM: CHEST  2 VIEW COMPARISON:  02/14/2017 FINDINGS: Cardiac shadow is within normal limits. Patchy right middle lobe infiltrate is seen new from the prior exam. No sizable effusion is seen. No bony abnormality is noted.  IMPRESSION: Patchy  right middle lobe infiltrate. Electronically Signed   By: Inez Catalina M.D.   On: 04/05/2017 15:03    Procedures Procedures (including critical care time)  Medications Ordered in ED Medications  amoxicillin (AMOXIL) capsule 1,000 mg (not administered)  albuterol (PROVENTIL) (2.5 MG/3ML) 0.083% nebulizer solution (2.5 mg  Given 04/05/17 1421)  ipratropium-albuterol (DUONEB) 0.5-2.5 (3) MG/3ML nebulizer solution (3 mLs  Given 04/05/17 1421)     Initial Impression / Assessment and Plan / ED Course  I have reviewed the triage vital signs and the nursing notes.  Pertinent labs & imaging results that were available during my care of the patient were reviewed by me and considered in my medical decision making (see chart for details).     Pt comes in with DIB. Her lung exam revealed some rhonchus and there infiltrate on CXR. Will tx as CAP. Strict ER return precautions have been discussed, and patient is agreeing with the plan and is comfortable with the workup done and the recommendations from the ER.    Final Clinical Impressions(s) / ED Diagnoses   Final diagnoses:  Community acquired pneumonia of right middle lobe of lung ([place])    New Prescriptions New Prescriptions   AMOXICILLIN (AMOXIL) 500 MG CAPSULE    Take 2 capsules (1,000 mg total) by mouth 3 (three) times daily.   AZITHROMYCIN (ZITHROMAX) 250 MG TABLET    Take 1 tablet (250 mg total) by mouth daily. Take first 2 tablets together, then 1 every day until finished.     Varney Biles, MD 04/05/17 1606

## 2017-04-06 ENCOUNTER — Encounter: Payer: Self-pay | Admitting: Obstetrics and Gynecology

## 2017-04-10 ENCOUNTER — Telehealth: Payer: Self-pay | Admitting: Cardiology

## 2017-04-10 DIAGNOSIS — I10 Essential (primary) hypertension: Secondary | ICD-10-CM

## 2017-04-10 DIAGNOSIS — I428 Other cardiomyopathies: Secondary | ICD-10-CM

## 2017-04-10 DIAGNOSIS — N83299 Other ovarian cyst, unspecified side: Secondary | ICD-10-CM | POA: Diagnosis not present

## 2017-04-10 DIAGNOSIS — I420 Dilated cardiomyopathy: Secondary | ICD-10-CM

## 2017-04-10 DIAGNOSIS — Z09 Encounter for follow-up examination after completed treatment for conditions other than malignant neoplasm: Secondary | ICD-10-CM | POA: Diagnosis not present

## 2017-04-10 MED ORDER — ALBUTEROL SULFATE HFA 108 (90 BASE) MCG/ACT IN AERS: 2.0000 | INHALATION_SPRAY | Freq: Four times a day (QID) | RESPIRATORY_TRACT | 2 refills | Status: DC | PRN

## 2017-04-10 MED FILL — METHYLPREDNISOLONE 4 MG TAB: 4 | 6 days supply | Qty: 21 | Fill #0

## 2017-04-10 MED FILL — SYMBICORT 80-4.5 MCG INH: 80-4.5 | 30 days supply | Qty: 10 | Fill #4

## 2017-04-10 MED FILL — PROGESTERONE 200 MG CAPSULE: 200 | 30 days supply | Qty: 30 | Fill #0 | Status: TO

## 2017-04-10 NOTE — Telephone Encounter (Signed)
Yes, please refill accordingly.

## 2017-04-10 NOTE — Telephone Encounter (Signed)
[facility] pharmacy requesting a refill on Ventolin HFA 90 mcg inhaler. Would you like to refill this medication? Please advise

## 2017-04-10 NOTE — Telephone Encounter (Signed)
Pt's medication was sent to pt's pharmacy as requested. Confirmation received.  °

## 2017-05-13 DIAGNOSIS — N99840 Postprocedural hematoma of a genitourinary system organ or structure following a genitourinary system procedure: Secondary | ICD-10-CM | POA: Diagnosis not present

## 2017-05-13 DIAGNOSIS — Z09 Encounter for follow-up examination after completed treatment for conditions other than malignant neoplasm: Secondary | ICD-10-CM | POA: Diagnosis not present

## 2017-05-13 DIAGNOSIS — R102 Pelvic and perineal pain: Secondary | ICD-10-CM | POA: Diagnosis not present

## 2017-06-14 ENCOUNTER — Emergency Department (HOSPITAL_BASED_OUTPATIENT_CLINIC_OR_DEPARTMENT_OTHER)
Admission: EM | Admit: 2017-06-14 | Discharge: 2017-06-14 | Disposition: A | Discharge status: Home / Self Care | Payer: 59 | Attending: Emergency Medicine | Admitting: Emergency Medicine

## 2017-06-14 ENCOUNTER — Encounter (HOSPITAL_BASED_OUTPATIENT_CLINIC_OR_DEPARTMENT_OTHER): Payer: Self-pay | Admitting: Emergency Medicine

## 2017-06-14 ENCOUNTER — Other Ambulatory Visit: Payer: Self-pay

## 2017-06-14 DIAGNOSIS — I110 Hypertensive heart disease with heart failure: Secondary | ICD-10-CM | POA: Insufficient documentation

## 2017-06-14 DIAGNOSIS — Z79899 Other long term (current) drug therapy: Secondary | ICD-10-CM | POA: Insufficient documentation

## 2017-06-14 DIAGNOSIS — J45909 Unspecified asthma, uncomplicated: Secondary | ICD-10-CM | POA: Diagnosis not present

## 2017-06-14 DIAGNOSIS — R21 Rash and other nonspecific skin eruption: Secondary | ICD-10-CM | POA: Insufficient documentation

## 2017-06-14 DIAGNOSIS — Z87891 Personal history of nicotine dependence: Secondary | ICD-10-CM | POA: Diagnosis not present

## 2017-06-14 DIAGNOSIS — Z8541 Personal history of malignant neoplasm of cervix uteri: Secondary | ICD-10-CM | POA: Insufficient documentation

## 2017-06-14 DIAGNOSIS — I5022 Chronic systolic (congestive) heart failure: Secondary | ICD-10-CM | POA: Insufficient documentation

## 2017-06-14 MED ORDER — SULFAMETHOXAZOLE-TRIMETHOPRIM 800-160 MG PO TABS
1.0000 | ORAL_TABLET | Freq: Two times a day (BID) | ORAL | 0 refills | Status: AC
Start: 2017-06-14 — End: 2017-06-21

## 2017-06-14 MED ORDER — MUPIROCIN CALCIUM 2 % EX CREA: 1.0000 "application " | TOPICAL_CREAM | Freq: Two times a day (BID) | CUTANEOUS | 0 refills | Status: DC

## 2017-06-14 MED ORDER — CEPHALEXIN 500 MG PO CAPS: 500.0000 mg | ORAL_CAPSULE | Freq: Four times a day (QID) | ORAL | 0 refills | Status: DC

## 2017-06-14 MED ORDER — IBUPROFEN 800 MG PO TABS: 800.0000 mg | ORAL_TABLET | Freq: Three times a day (TID) | ORAL | 0 refills | Status: DC

## 2017-06-14 NOTE — ED Triage Notes (Signed)
Patient has rash under right arm x 4 days.  Rash burns and is painful.  Pain is 5/10.  Rash is red and irregular borders and skin has been pulled away.    Patient states daughter was diagnosed with impetigo and staff infection 05-20-17.    No changes laundry cleaner or shaving.

## 2017-06-14 NOTE — ED Provider Notes (Signed)
[facility]PART[facility] Provider Note   CSN: [id number] Arrival date & time: 06/14/17  0909     History   Chief Complaint Chief Complaint  Patient presents with  . Rash    HPI ICIE KUZNICKI is a 31 y.o. female with history of cervical cancer, chronic systolic CHF, hypertension, GERD who presents with a over 1 week history of rash to right axilla.  Patient reports that she has recently been exposed to impetigo and MRSA from her daughter who is undergoing antibiotic treatment.  She presents with her significant other and another child with similar symptoms.  She has been applying Bactroban cream at home without relief.  She has had burning pain associated to the rash.  She has also had some green drainage.  She reports that areas of the skin have come off.  She denies any fevers or other symptoms.  HPI  Past Medical History:  Diagnosis Date  . Allergy-induced asthma    Allergy induced asthma  . Anxiety   . 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 Chronic systolic CHF (congestive heart failure) ([place])   . Essential hypertension    Taken for EF function and tachycardia  . GERD (gastroesophageal reflux disease)   . Headache    migraines  . 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 History of colon polyps   . History of ovarian cyst   . Pelvic pain in female   . Peripartum cardiomyopathy    a. EF 40-45% in 2016, varying by several echoes. F/u echo 06/2015 showed EF 50%.   Marland Kitchen PONV (postoperative nausea and vomiting)   . Sinus tachycardia    a. 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 b. 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    Patient Active Problem List   Diagnosis Date Noted  . Post-operative complication 69/62/9528  . S/P laparoscopic assisted vaginal hysterectomy (LAVH) 02/04/2017  . Left knee pain 01/12/2017  . Lumbar spondylosis 01/12/2017  . Viral pharyngitis 12/20/2016  . Acute  sinusitis 11/11/2016  . Morbid obesity ([place]) 11/11/2016  . Paresthesia 10/30/2016  . Lung nodule 08/26/2016  . Mild persistent asthma without complication 41/32/4401  . RLS (restless legs syndrome) 12/19/2015  . Anxiety state 12/19/2015  . Essential hypertension 10/09/2015  . Acute on chronic systolic CHF (congestive heart failure) ([place]) 10/09/2015  . Nonischemic congestive cardiomyopathy ([place]) 10/09/2015  . Dyspnea 07/20/2015  . SOB (shortness of breath) 07/12/2015  . Acute congestive heart failure ([place]) 06/06/2015  . Cardiomyopathy ([place]) 05/18/2015  . Hypertension in pregnancy, preeclampsia, severe 05/18/2015  . Inappropriate sinus tachycardia 04/13/2015  . H/O cardiomyopathy 04/13/2015  . H/O cardiovascular disorder 04/13/2015  . Chest pain 01/12/2015  . Palpitations 01/12/2015  . Decreased cardiac ejection fraction 01/12/2015  . Abdominal pain in pregnancy, antepartum   . Chronic female pelvic pain 08/18/2014  . Severe obesity (BMI >= 40) ([place]) 01/12/2013  . Fatty liver 01/12/2013  . Adiposity 01/12/2013  . GERD (gastroesophageal reflux disease)   . Asthma with acute exacerbation 11/27/2011  . Excess, menstruation 11/27/2011    Past Surgical History:  Procedure Laterality Date  . CESAREAN SECTION  03-01-2008  and 2016  . IM PINNING RIGHT ELBOW FX  1992   HARDWARE REMOVED  . LAPAROSCOPIC ASSISTED VAGINAL HYSTERECTOMY Left 02/04/2017   Procedure: LAPAROSCOPIC ASSISTED VAGINAL HYSTERECTOMY, LEFT SALPINGOOPHORECTOMY;  Surgeon: Dian Queen, MD;  Location: [facility];  Service:  Gynecology;  Laterality: Left;  NEED BED  . LAPAROSCOPY N/A 07/29/2014   Procedure: LAPAROSCOPY DIAGNOSTIC;  Surgeon: Cyril Mourning, MD;  Location: [facility];  Service: Gynecology;  Laterality: N/A;  . OOPHORECTOMY Left 02/04/2017   Procedure: LEFT OOPHORECTOMY;  Surgeon: Dian Queen, MD;  Location: [facility];  Service: Gynecology;  Laterality:  Left;    OB History    Gravida Para Term Preterm AB Living   4 2 2   1 2    SAB TAB Ectopic Multiple Live Births   1               Home Medications    Prior to Admission medications   Medication Sig Start Date End Date Taking? Authorizing Provider  albuterol (PROVENTIL HFA;VENTOLIN HFA) 108 (90 Base) MCG/ACT inhaler Inhale 2 puffs into the lungs every 6 (six) hours as needed for wheezing or shortness of breath. 04/10/17   [name], MD  amoxicillin (AMOXIL) 500 MG capsule Take 2 capsules (1,000 mg total) by mouth 3 (three) times daily. 04/05/17   Varney Biles, MD  amoxicillin-clavulanate (AUGMENTIN) 875-125 MG tablet Take 1 tablet by mouth 2 (two) times daily. 02/15/17   Tyson Dense, MD  azithromycin (ZITHROMAX) 250 MG tablet Take 1 tablet (250 mg total) by mouth daily. Take first 2 tablets together, then 1 every day until finished. 04/05/17   Varney Biles, MD  budesonide-formoterol (SYMBICORT) 80-4.5 MCG/ACT inhaler 2 pffs at bedtime 06/27/16   Tanda Rockers, MD  cephALEXin (KEFLEX) 500 MG capsule Take 1 capsule (500 mg total) by mouth 4 (four) times daily. 06/14/17   Allyiah Gartner, Bea Graff, PA-C  diazepam (VALIUM) 2 MG tablet Take 1 tablet (2 mg total) by mouth every 12 (twelve) hours as needed for anxiety. 10/30/16   Lucille Passy, MD  famotidine (PEPCID) 20 MG tablet Take 1 tablet (20 mg total) by mouth at bedtime. 05/08/16   Lyda Jester M, PA-C  furosemide (LASIX) 40 MG tablet TAKE 1 TABLET BY MOUTH TWICE DAILY 10/10/16   [name], MD  gabapentin (NEURONTIN) 300 MG capsule Start with 1 tab po qhs X 1 week, then increase to 1 tab po bid X 1 week then 1 tab po tid prn 12/30/16   Gerda Diss, DO  ibuprofen (ADVIL,MOTRIN) 800 MG tablet Take 1 tablet (800 mg total) by mouth 3 (three) times daily. 06/14/17   Ngozi Alvidrez, Bea Graff, PA-C  losartan (COZAAR) 25 MG tablet TAKE 1/2 TABLET BY MOUTH AT BEDTIME 10/10/16   [name], MD  methocarbamol (ROBAXIN) 500  MG tablet Take 1 tablet (500 mg total) by mouth every 6 (six) hours as needed for muscle spasms. 02/05/17   Dian Queen, MD  metoprolol tartrate (LOPRESSOR) 25 MG tablet Take 75 mg by mouth 2 (two) times daily.    [provider]  mupirocin cream (BACTROBAN) 2 % Apply 1 application topically 2 (two) times daily. 06/14/17   Daunte Oestreich, Bea Graff, PA-C  oxyCODONE (OXY IR/ROXICODONE) 5 MG immediate release tablet Take 1-2 tablets (5-10 mg total) by mouth every 6 (six) hours as needed for severe pain. 02/15/17   Tyson Dense, MD  pantoprazole (PROTONIX) 40 MG tablet Take 1 tablet (40 mg total) by mouth daily. Take 30-60 min before first meal of the day 05/08/16   Lyda Jester M, PA-C  potassium chloride SA (K-DUR,KLOR-CON) 20 MEQ tablet Take 1 tab by mouth daily for one week, then take 1 tab by  mouth daily only when you take lasix, thereafter. 11/11/16   [name], MD  sulfamethoxazole-trimethoprim (BACTRIM DS,SEPTRA DS) 800-160 MG tablet Take 1 tablet by mouth 2 (two) times daily for 7 days. 06/14/17 06/21/17  Frederica Kuster, PA-C    Family History Family History  Problem Relation Age of Onset  . Asthma Mother   . Crohn's disease Mother   . Colon polyps Mother   . Hyperlipidemia Father   . Hemophilia Father   . COPD Father        smoked  . Hemophilia Son   . Heart attack Maternal Grandfather   . Diabetes Paternal Grandfather   . Stroke Paternal Grandfather   . Heart failure Brother   . Heart failure Paternal Uncle   . Heart failure Paternal Aunt   . COPD Maternal Grandmother        smoked  . Lung cancer Maternal Grandmother        smoked  . Hypertension Neg Hx     Social History Social History   Tobacco Use  . Smoking status: Former Smoker    Packs/day: 0.50    Years: 10.00    Pack years: 5.00    Types: Cigarettes    Last attempt to quit: 02/13/2015    Years since quitting: 2.3  . Smokeless tobacco: Never Used  Substance Use Topics  . Alcohol use:  No    Alcohol/week: 0.0 oz    Comment: social  . Drug use: No     Allergies   Vicodin [hydrocodone-acetaminophen]   Review of Systems Review of Systems  Constitutional: Negative for fever.  Skin: Positive for rash.     Physical Exam Updated Vital Signs BP 120/78 (BP Location: Left Arm)   Pulse 84   Temp 97.8 F (36.6 C) (Oral)   Resp 20   Ht 5\' 6"  (1.676 m)   Wt 113.4 kg (250 lb)   LMP 12/09/2016   SpO2 94%   BMI 40.35 kg/m   Physical Exam  Constitutional: She appears well-developed and well-nourished. No distress.  HENT:  Head: Normocephalic and atraumatic.  Mouth/Throat: Oropharynx is clear and moist. No oropharyngeal exudate.  Eyes: Conjunctivae are normal. Pupils are equal, round, and reactive to light. Right eye exhibits no discharge. Left eye exhibits no discharge. No scleral icterus.  Neck: Normal range of motion. Neck supple. No thyromegaly present.  Cardiovascular: Normal rate, regular rhythm, normal heart sounds and intact distal pulses. Exam reveals no gallop and no friction rub.  No murmur heard. Pulmonary/Chest: Effort normal and breath sounds normal. No stridor. No respiratory distress. She has no wheezes. She has no rales.  Abdominal: Soft. Bowel sounds are normal. She exhibits no distension. There is no tenderness. There is no rebound and no guarding.  Musculoskeletal: She exhibits no edema.  Lymphadenopathy:    She has no cervical adenopathy.  Neurological: She is alert. Coordination normal.  Skin: Skin is warm and dry. No rash noted. She is not diaphoretic. No pallor.  Tender, erythematous, crusted lesions with some epidermal skin loss, no fluctuance -see photo  Psychiatric: She has a normal mood and affect.  Nursing note and vitals reviewed.      ED Treatments / Results  Labs (all labs ordered are listed, but only abnormal results are displayed) Labs Reviewed - No data to display  EKG  EKG Interpretation None       Radiology No  results found.  Procedures Procedures (including critical care time)  Medications Ordered in  ED Medications - No data to display   Initial Impression / Assessment and Plan / ED Course  I have reviewed the triage vital signs and the nursing notes.  Pertinent labs & imaging results that were available during my care of the patient were reviewed by me and considered in my medical decision making (see chart for details).     Rash concerning for impetigo and/or MRSA.  No mucosal involvement.  Low suspicion for SJS/TEN or other emergent skin disorder.  No abscess needing incision and drainage today.  Will treat with continued Bactroban and also Bactrim and Keflex.  Advised patient to follow-up with PCP if symptoms not improving.  Return precautions discussed.  Patient understands and agrees with plan.  Patient vitals stable throughout ED course and discharged in satisfactory condition.  Final Clinical Impressions(s) / ED Diagnoses   Final diagnoses:  Rash and nonspecific skin eruption    ED Discharge Orders        Ordered    cephALEXin (KEFLEX) 500 MG capsule  4 times daily     06/14/17 1000    sulfamethoxazole-trimethoprim (BACTRIM DS,SEPTRA DS) 800-160 MG tablet  2 times daily     06/14/17 1000    mupirocin cream (BACTROBAN) 2 %  2 times daily     06/14/17 1000    ibuprofen (ADVIL,MOTRIN) 800 MG tablet  3 times daily,   Status:  Discontinued     06/14/17 1001    ibuprofen (ADVIL,MOTRIN) 800 MG tablet  3 times daily     06/14/17 [address] [place] 06/14/17 1016    Lajean Saver, MD 06/14/17 1218

## 2017-06-14 NOTE — Discharge Instructions (Signed)
Apply Bactroban ointment to the affected areas twice daily.  Take oral antibiotics as prescribed until completion.  Take ibuprofen every 8 hours as needed for pain.  Please return to the emergency department or see your doctor if you develop any new or worsening symptoms, or if your symptoms are not improving.

## 2017-06-14 NOTE — ED Notes (Signed)
Patient understood AVS instructions.  She is A & O x4.

## 2017-06-25 ENCOUNTER — Other Ambulatory Visit: Payer: Self-pay

## 2017-06-25 DIAGNOSIS — N99840 Postprocedural hematoma of a genitourinary system organ or structure following a genitourinary system procedure: Secondary | ICD-10-CM | POA: Diagnosis not present

## 2017-06-25 DIAGNOSIS — Z09 Encounter for follow-up examination after completed treatment for conditions other than malignant neoplasm: Secondary | ICD-10-CM | POA: Diagnosis not present

## 2017-06-25 MED ORDER — FAMOTIDINE 20 MG PO TABS: 20.0000 mg | ORAL_TABLET | Freq: Every day | ORAL | 2 refills | Status: DC

## 2017-07-02 ENCOUNTER — Other Ambulatory Visit: Payer: Self-pay | Admitting: Cardiology

## 2017-07-02 MED ORDER — PANTOPRAZOLE SODIUM 40 MG PO TBEC: 40.0000 mg | DELAYED_RELEASE_TABLET | Freq: Every day | ORAL | 3 refills | Status: DC

## 2017-07-19 ENCOUNTER — Other Ambulatory Visit: Payer: Self-pay

## 2017-07-19 ENCOUNTER — Emergency Department (HOSPITAL_BASED_OUTPATIENT_CLINIC_OR_DEPARTMENT_OTHER): Payer: Managed Care, Other (non HMO)

## 2017-07-19 ENCOUNTER — Emergency Department (HOSPITAL_BASED_OUTPATIENT_CLINIC_OR_DEPARTMENT_OTHER)
Admission: EM | Admit: 2017-07-19 | Discharge: 2017-07-20 | Disposition: A | Discharge status: Home / Self Care | Payer: Managed Care, Other (non HMO) | Attending: Emergency Medicine | Admitting: Emergency Medicine

## 2017-07-19 ENCOUNTER — Encounter (HOSPITAL_BASED_OUTPATIENT_CLINIC_OR_DEPARTMENT_OTHER): Payer: Self-pay | Admitting: *Deleted

## 2017-07-19 DIAGNOSIS — R102 Pelvic and perineal pain: Secondary | ICD-10-CM

## 2017-07-19 DIAGNOSIS — Z87891 Personal history of nicotine dependence: Secondary | ICD-10-CM | POA: Insufficient documentation

## 2017-07-19 DIAGNOSIS — I110 Hypertensive heart disease with heart failure: Secondary | ICD-10-CM | POA: Insufficient documentation

## 2017-07-19 DIAGNOSIS — Z8541 Personal history of malignant neoplasm of cervix uteri: Secondary | ICD-10-CM | POA: Diagnosis not present

## 2017-07-19 DIAGNOSIS — J45909 Unspecified asthma, uncomplicated: Secondary | ICD-10-CM | POA: Diagnosis not present

## 2017-07-19 DIAGNOSIS — I5022 Chronic systolic (congestive) heart failure: Secondary | ICD-10-CM | POA: Insufficient documentation

## 2017-07-19 DIAGNOSIS — Z79899 Other long term (current) drug therapy: Secondary | ICD-10-CM | POA: Diagnosis not present

## 2017-07-19 HISTORY — DX: Other specified conditions associated with female genital organs and menstrual cycle: N94.89

## 2017-07-19 LAB — COMPREHENSIVE METABOLIC PANEL
ALK PHOS: 55 U/L (ref 38–126)
ALT: 14 U/L (ref 14–54)
ANION GAP: 6 (ref 5–15)
AST: 15 U/L (ref 15–41)
Albumin: 3.5 g/dL (ref 3.5–5.0)
BUN: 8 mg/dL (ref 6–20)
CALCIUM: 8.7 mg/dL — AB (ref 8.9–10.3)
CHLORIDE: 109 mmol/L (ref 101–111)
CO2: 21 mmol/L — ABNORMAL LOW (ref 22–32)
CREATININE: 0.59 mg/dL (ref 0.44–1.00)
Glucose, Bld: 97 mg/dL (ref 65–99)
Potassium: 3.8 mmol/L (ref 3.5–5.1)
Sodium: 136 mmol/L (ref 135–145)
Total Bilirubin: 0.4 mg/dL (ref 0.3–1.2)
Total Protein: 6.6 g/dL (ref 6.5–8.1)

## 2017-07-19 LAB — CBC WITH DIFFERENTIAL/PLATELET
BASOS PCT: 0 %
Basophils Absolute: 0 10*3/uL (ref 0.0–0.1)
EOS ABS: 0.4 10*3/uL (ref 0.0–0.7)
Eosinophils Relative: 4 %
HCT: 38.2 % (ref 36.0–46.0)
HEMOGLOBIN: 12.7 g/dL (ref 12.0–15.0)
Lymphocytes Relative: 32 %
Lymphs Abs: 3.2 10*3/uL (ref 0.7–4.0)
MCH: 30.5 pg (ref 26.0–34.0)
MCHC: 33.2 g/dL (ref 30.0–36.0)
MCV: 91.6 fL (ref 78.0–100.0)
Monocytes Absolute: 0.6 10*3/uL (ref 0.1–1.0)
Monocytes Relative: 6 %
NEUTROS PCT: 58 %
Neutro Abs: 5.8 10*3/uL (ref 1.7–7.7)
Platelets: 198 10*3/uL (ref 150–400)
RBC: 4.17 MIL/uL (ref 3.87–5.11)
RDW: 13.4 % (ref 11.5–15.5)
WBC: 10.1 10*3/uL (ref 4.0–10.5)

## 2017-07-19 LAB — URINALYSIS, ROUTINE W REFLEX MICROSCOPIC
BILIRUBIN URINE: NEGATIVE
GLUCOSE, UA: NEGATIVE mg/dL
HGB URINE DIPSTICK: NEGATIVE
Ketones, ur: NEGATIVE mg/dL
Leukocytes, UA: NEGATIVE
Nitrite: NEGATIVE
PROTEIN: NEGATIVE mg/dL
Specific Gravity, Urine: 1.025 (ref 1.005–1.030)
pH: 6.5 (ref 5.0–8.0)

## 2017-07-19 MED ORDER — OXYCODONE-ACETAMINOPHEN 5-325 MG PO TABS
1.0000 | ORAL_TABLET | Freq: Once | ORAL | Status: AC
  Administered 2017-07-19: 1 via ORAL
  Filled 2017-07-19: qty 1

## 2017-07-19 MED ORDER — IOPAMIDOL (ISOVUE-300) INJECTION 61%
100.0000 mL | Freq: Once | INTRAVENOUS | Status: AC | PRN
  Administered 2017-07-19: 100 mL via INTRAVENOUS

## 2017-07-19 MED ORDER — ONDANSETRON 8 MG PO TBDP
8.0000 mg | ORAL_TABLET | Freq: Once | ORAL | Status: AC
  Administered 2017-07-19: 8 mg via ORAL
  Filled 2017-07-19: qty 1

## 2017-07-19 NOTE — ED Triage Notes (Signed)
Pt reports she had hysterectomy in July. Was dx with pelvic hematoma in August and has pain from that. States pain exacerbation began last night. C/o lower abd pain and back pain. She has taken PO dilaudid today (left over from September) without relief

## 2017-07-19 NOTE — ED Provider Notes (Signed)
[facility] EMERGENCY DEPARTMENT Provider Note   CSN: [id number] Arrival date & time: 07/19/17  6314     History   Chief Complaint Chief Complaint  Patient presents with  . Back Pain    HPI Michelle Kane is a 32 y.o. female.  HPI   Patient is a 32 year old female presenting with pelvic pain.  Patient reports she has pain in her pelvis radiating to her back.  She reports that she is had this for a long period of time.  She reports that she 6 months ago had a hysterectomy and had a complication of a pelvic hematoma, it had been imaged recently and has been resolving.  However her pain has increased in the last day.  It is having 9 pain, made better with p.o. Dilaudid.  That she had at home.  Patient had no vomiting.  Has no bowel pain.  No diarrhea.  No fevers.  Has had no discharge. Past Medical History:  Diagnosis Date  . Allergy-induced asthma    Allergy induced asthma  . Anxiety   . 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 Chronic systolic CHF (congestive heart failure) ([place])   . Essential hypertension    Taken for EF function and tachycardia  . GERD (gastroesophageal reflux disease)   . Headache    migraines  . 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 History of colon polyps   . History of ovarian cyst   . Pelvic hematoma, female   . Pelvic pain in female   . Peripartum cardiomyopathy    a. EF 40-45% in 2016, varying by several echoes. F/u echo 06/2015 showed EF 50%.   Marland Kitchen PONV (postoperative nausea and vomiting)   . Sinus tachycardia    a. 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 b. 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    Patient Active Problem List   Diagnosis Date Noted  . Post-operative complication 97/08/6376  . S/P laparoscopic assisted vaginal hysterectomy (LAVH) 02/04/2017  . Left knee pain 01/12/2017  . Lumbar spondylosis 01/12/2017  . Viral pharyngitis 12/20/2016  . Acute  sinusitis 11/11/2016  . Morbid obesity ([place]) 11/11/2016  . Paresthesia 10/30/2016  . Lung nodule 08/26/2016  . Mild persistent asthma without complication 58/85/0277  . RLS (restless legs syndrome) 12/19/2015  . Anxiety state 12/19/2015  . Essential hypertension 10/09/2015  . Acute on chronic systolic CHF (congestive heart failure) ([place]) 10/09/2015  . Nonischemic congestive cardiomyopathy ([place]) 10/09/2015  . Dyspnea 07/20/2015  . SOB (shortness of breath) 07/12/2015  . Acute congestive heart failure ([place]) 06/06/2015  . Cardiomyopathy ([place]) 05/18/2015  . Hypertension in pregnancy, preeclampsia, severe 05/18/2015  . Inappropriate sinus tachycardia 04/13/2015  . H/O cardiomyopathy 04/13/2015  . H/O cardiovascular disorder 04/13/2015  . Chest pain 01/12/2015  . Palpitations 01/12/2015  . Decreased cardiac ejection fraction 01/12/2015  . Abdominal pain in pregnancy, antepartum   . Chronic female pelvic pain 08/18/2014  . Severe obesity (BMI >= 40) ([place]) 01/12/2013  . Fatty liver 01/12/2013  . Adiposity 01/12/2013  . GERD (gastroesophageal reflux disease)   . Asthma with acute exacerbation 11/27/2011  . Excess, menstruation 11/27/2011    Past Surgical History:  Procedure Laterality Date  . CESAREAN SECTION  03-01-2008  and 2016  . IM PINNING RIGHT ELBOW FX  1992   HARDWARE REMOVED  . LAPAROSCOPIC ASSISTED VAGINAL HYSTERECTOMY Left 02/04/2017   Procedure: LAPAROSCOPIC ASSISTED VAGINAL HYSTERECTOMY,  LEFT SALPINGOOPHORECTOMY;  Surgeon: Dian Queen, MD;  Location: [facility];  Service: Gynecology;  Laterality: Left;  NEED BED  . LAPAROSCOPY N/A 07/29/2014   Procedure: LAPAROSCOPY DIAGNOSTIC;  Surgeon: Cyril Mourning, MD;  Location: [facility];  Service: Gynecology;  Laterality: N/A;  . OOPHORECTOMY Left 02/04/2017   Procedure: LEFT OOPHORECTOMY;  Surgeon: Dian Queen, MD;  Location: [facility];  Service: Gynecology;  Laterality:  Left;    OB History    Gravida Para Term Preterm AB Living   4 2 2   1 2    SAB TAB Ectopic Multiple Live Births   1               Home Medications    Prior to Admission medications   Medication Sig Start Date End Date Taking? Authorizing Provider  albuterol (PROVENTIL HFA;VENTOLIN HFA) 108 (90 Base) MCG/ACT inhaler Inhale 2 puffs into the lungs every 6 (six) hours as needed for wheezing or shortness of breath. 04/10/17  Yes [name], MD  budesonide-formoterol [facility]) 80-4.5 MCG/ACT inhaler 2 pffs at bedtime 06/27/16  Yes Tanda Rockers, MD  diazepam (VALIUM) 2 MG tablet Take 1 tablet (2 mg total) by mouth every 12 (twelve) hours as needed for anxiety. 10/30/16  Yes Lucille Passy, MD  famotidine (PEPCID) 20 MG tablet Take 1 tablet (20 mg total) by mouth at bedtime. 06/25/17  Yes Simmons, Brittainy M, PA-C  furosemide (LASIX) 40 MG tablet TAKE 1 TABLET BY MOUTH TWICE DAILY 10/10/16  Yes [name], MD  ibuprofen (ADVIL,MOTRIN) 800 MG tablet Take 1 tablet (800 mg total) by mouth 3 (three) times daily. 06/14/17  Yes Law, [place], PA-C  losartan (COZAAR) 25 MG tablet TAKE 1/2 TABLET BY MOUTH AT BEDTIME 10/10/16  Yes [name], MD  metoprolol tartrate (LOPRESSOR) 25 MG tablet Take 75 mg by mouth 2 (two) times daily.   Yes [provider]  pantoprazole (PROTONIX) 40 MG tablet Take 1 tablet (40 mg total) by mouth daily. Take 30-60 min before first meal of the day 07/02/17  Yes [name], MD  amoxicillin (AMOXIL) 500 MG capsule Take 2 capsules (1,000 mg total) by mouth 3 (three) times daily. 04/05/17   Varney Biles, MD  amoxicillin-clavulanate (AUGMENTIN) 875-125 MG tablet Take 1 tablet by mouth 2 (two) times daily. 02/15/17   Tyson Dense, MD  azithromycin (ZITHROMAX) 250 MG tablet Take 1 tablet (250 mg total) by mouth daily. Take first 2 tablets together, then 1 every day until finished. 04/05/17   Varney Biles, MD  cephALEXin (KEFLEX)  500 MG capsule Take 1 capsule (500 mg total) by mouth 4 (four) times daily. 06/14/17   Law, Bea Graff, PA-C  gabapentin (NEURONTIN) 300 MG capsule Start with 1 tab po qhs X 1 week, then increase to 1 tab po bid X 1 week then 1 tab po tid prn 12/30/16   Gerda Diss, DO  methocarbamol (ROBAXIN) 500 MG tablet Take 1 tablet (500 mg total) by mouth every 6 (six) hours as needed for muscle spasms. 02/05/17   Dian Queen, MD  mupirocin cream (BACTROBAN) 2 % Apply 1 application topically 2 (two) times daily. 06/14/17   Law, Bea Graff, PA-C  oxyCODONE (OXY IR/ROXICODONE) 5 MG immediate release tablet Take 1-2 tablets (5-10 mg total) by mouth every 6 (six) hours as needed for severe pain. 02/15/17   Tyson Dense, MD  potassium chloride SA (K-DUR,KLOR-CON) 20 MEQ  tablet Take 1 tab by mouth daily for one week, then take 1 tab by mouth daily only when you take lasix, thereafter. 11/11/16   [name], MD    Family History Family History  Problem Relation Age of Onset  . Asthma Mother   . Crohn's disease Mother   . Colon polyps Mother   . Hyperlipidemia Father   . Hemophilia Father   . COPD Father        smoked  . Hemophilia Son   . Heart attack Maternal Grandfather   . Diabetes Paternal Grandfather   . Stroke Paternal Grandfather   . Heart failure Brother   . Heart failure Paternal Uncle   . Heart failure Paternal Aunt   . COPD Maternal Grandmother        smoked  . Lung cancer Maternal Grandmother        smoked  . Hypertension Neg Hx     Social History Social History   Tobacco Use  . Smoking status: Former Smoker    Packs/day: 0.50    Years: 10.00    Pack years: 5.00    Types: Cigarettes    Last attempt to quit: 02/13/2015    Years since quitting: 2.4  . Smokeless tobacco: Never Used  Substance Use Topics  . Alcohol use: No    Alcohol/week: 0.0 oz    Comment: social  . Drug use: No     Allergies   Vicodin [hydrocodone-acetaminophen]   Review of  Systems Review of Systems  Constitutional: Negative for activity change, fatigue and fever.  Respiratory: Negative for shortness of breath.   Cardiovascular: Negative for chest pain.  Gastrointestinal: Negative for abdominal pain, nausea and vomiting.  Genitourinary: Positive for pelvic pain.  All other systems reviewed and are negative.    Physical Exam Updated Vital Signs BP 134/81   Pulse 99   Temp 98.1 F (36.7 C) (Oral)   Resp 18   Ht 5' 6.75" (1.695 m)   Wt 113.4 kg (250 lb)   LMP 12/09/2016   SpO2 100%   BMI 39.45 kg/m   Physical Exam  Constitutional: She is oriented to person, place, and time. She appears well-developed and well-nourished.  HENT:  Head: Normocephalic and atraumatic.  Eyes: Right eye exhibits no discharge. Left eye exhibits no discharge.  Cardiovascular: Normal rate.  Pulmonary/Chest: Effort normal and breath sounds normal. No respiratory distress.  Abdominal: Soft. She exhibits no distension. There is no tenderness.  No real adnexal or abdominal tenderness.  Neurological: She is oriented to person, place, and time.  Skin: Skin is warm and dry. She is not diaphoretic.  Psychiatric: She has a normal mood and affect.  Nursing note and vitals reviewed.    ED Treatments / Results  Labs (all labs ordered are listed, but only abnormal results are displayed) Labs Reviewed  COMPREHENSIVE METABOLIC PANEL - Abnormal; Notable for the following components:      Result Value   CO2 21 (*)    Calcium 8.7 (*)    All other components within normal limits  URINALYSIS, ROUTINE W REFLEX MICROSCOPIC  CBC WITH DIFFERENTIAL/PLATELET    EKG  EKG Interpretation None       Radiology Ct Abdomen Pelvis W Contrast  Result Date: 07/20/2017 CLINICAL DATA:  Lower abdominal back pain. History of pelvic hematoma post hysterectomy 6 months prior, increasing pain. EXAM: CT ABDOMEN AND PELVIS WITH CONTRAST TECHNIQUE: Multidetector CT imaging of the abdomen and pelvis  was performed using the  standard protocol following bolus administration of intravenous contrast. CONTRAST:  137mL ISOVUE-300 IOPAMIDOL (ISOVUE-300) INJECTION 61% COMPARISON:  CT 02/14/2017 FINDINGS: Lower chest: Lung bases are clear. Hepatobiliary: Prominent size liver spanning 22 cm. No focal hepatic lesion. Gallbladder physiologically distended, no calcified stone. No biliary dilatation. Pancreas: No ductal dilatation or inflammation. Spleen: Upper normal in size spanning 13.2 cm. No focal abnormality. Adrenals/Urinary Tract: Normal adrenal glands. No hydronephrosis or perinephric edema. Homogeneous renal enhancement. Urinary bladder is partially distended limiting assessment for wall thickening. Stomach/Bowel: Stomach physiologically distended. No bowel dilatation, inflammation or wall thickening. Mild fecalized small bowel contents in the left abdomen. Small to moderate colonic stool burden. Normal appendix. Vascular/Lymphatic: Normal caliber abdominal aorta. No enlarged abdominal or pelvic lymph nodes. Reproductive: Post hysterectomy. Previous pelvic hematoma has resolved. Complex 3.4 x 2.5 cm cyst in the right ovary. Left ovary not visualized. Other: Previous retroperitoneal stranding has resolved. No ascites or free air. No intra-abdominal abscess. Musculoskeletal: Degenerative disc disease at L5-S1. There are no acute or suspicious osseous abnormalities. IMPRESSION: 1. Complex 3.4 cm right ovarian cyst, possibly hemorrhagic cyst. Consider ultrasound characterization in the setting of pelvic pain. 2. No other acute abnormality. 3. Previous pelvic hematoma has resolved. 4. Electronically Signed   By: Jeb Levering M.D.   On: 07/20/2017 00:13    Procedures Procedures (including critical care time)  Medications Ordered in ED Medications  oxyCODONE-acetaminophen (PERCOCET/ROXICET) 5-325 MG per tablet 1 tablet (1 tablet Oral Given 07/19/17 2326)  ondansetron (ZOFRAN-ODT) disintegrating tablet 8 mg (8 mg  Oral Given 07/19/17 2325)  iopamidol (ISOVUE-300) 61 % injection 100 mL (100 mLs Intravenous Contrast Given 07/19/17 2348)  oxyCODONE-acetaminophen (PERCOCET/ROXICET) 5-325 MG per tablet 1 tablet (1 tablet Oral Given 07/20/17 0126)     Initial Impression / Assessment and Plan / ED Course  I have reviewed the triage vital signs and the nursing notes.  Pertinent labs & imaging results that were available during my care of the patient were reviewed by me and considered in my medical decision making (see chart for details).     Patient is a 32 year old female presenting with pelvic pain.  Patient reports she has pain in her pelvis radiating to her back.  She reports that she is had this for a long period of time.  She reports that she 6 months ago had a hysterectomy and had a complication of a pelvic hematoma, it had been imaged recently and has been resolving.  However her pain has increased in the last day.  It is having 9 pain, made better with p.o. Dilaudid.  That she had at home.  Patient had no vomiting.  Has no bowel pain.  No diarrhea.  No fevers.  Has had no discharge.  12:02 AM Given patient's history of hematoma, will get labs, CT abdomen to make sure that is not changed.     1:47 AM Patient CT shows resolving hematoma.  However it shows cyst.  This could explain patient's pain discussed with her that if it were torsed that she would require surgery.  Patient centered discussion for discharge home with symptomatic care versus transfer for ultrasound to rule out torsion.  Decision made to transfer for ultrasound.  Will transfer to [facility] emergency department to get transvaginal ultrasound.    Patient will go by POV  Discussed with Dr. Betsey Holiday  at [facility].  Final Clinical Impressions(s) / ED Diagnoses   Final diagnoses:  None    ED Discharge Orders    None  Macarthur Critchley, MD 07/20/17 [phone]

## 2017-07-20 ENCOUNTER — Emergency Department (HOSPITAL_COMMUNITY): Payer: Managed Care, Other (non HMO)

## 2017-07-20 DIAGNOSIS — R102 Pelvic and perineal pain: Secondary | ICD-10-CM | POA: Diagnosis not present

## 2017-07-20 DIAGNOSIS — N83201 Unspecified ovarian cyst, right side: Secondary | ICD-10-CM | POA: Diagnosis not present

## 2017-07-20 MED ORDER — OXYCODONE-ACETAMINOPHEN 5-325 MG PO TABS
1.0000 | ORAL_TABLET | Freq: Once | ORAL | Status: AC
  Administered 2017-07-20: 1 via ORAL
  Filled 2017-07-20: qty 1

## 2017-07-20 NOTE — ED Notes (Signed)
Dr. Thomasene Lot into see update pt on results and transfer plan for [place]

## 2017-07-20 NOTE — ED Notes (Signed)
Taken to US.

## 2017-07-20 NOTE — ED Notes (Signed)
Alert, NAD, calm, family at [facility], "feel better", rates pain 6-7/10. Denies nausea. VSS.

## 2017-07-20 NOTE — ED Notes (Signed)
MD at [facility]

## 2017-07-20 NOTE — ED Provider Notes (Signed)
Patient sent to this ER from [place] to have ultrasound performed.  Patient experiencing back and pelvic discomfort.  She recently had a pelvic hematoma following hysterectomy.  CT scan performed revealed hematoma has resolved, however, there was concern for adnexal lesion on the right with possible sequela of torsion.  Patient has underwent ultrasound including transvaginal ultrasound with duplex, she has normal blood flow.  There is no evidence of torsion.  Previously seen lesion is likely sequela of hematoma.  Patient reassured, no further workup necessary.   Orpah Greek, MD 07/20/17 [phone]

## 2017-07-22 ENCOUNTER — Emergency Department (HOSPITAL_BASED_OUTPATIENT_CLINIC_OR_DEPARTMENT_OTHER)
Admission: EM | Admit: 2017-07-22 | Discharge: 2017-07-23 | Disposition: A | Discharge status: Home / Self Care | Payer: Managed Care, Other (non HMO) | Attending: Emergency Medicine | Admitting: Emergency Medicine

## 2017-07-22 ENCOUNTER — Telehealth: Payer: Self-pay

## 2017-07-22 ENCOUNTER — Other Ambulatory Visit: Payer: Self-pay

## 2017-07-22 ENCOUNTER — Encounter (HOSPITAL_BASED_OUTPATIENT_CLINIC_OR_DEPARTMENT_OTHER): Payer: Self-pay

## 2017-07-22 DIAGNOSIS — Z8541 Personal history of malignant neoplasm of cervix uteri: Secondary | ICD-10-CM | POA: Diagnosis not present

## 2017-07-22 DIAGNOSIS — I110 Hypertensive heart disease with heart failure: Secondary | ICD-10-CM | POA: Insufficient documentation

## 2017-07-22 DIAGNOSIS — M545 Low back pain, unspecified: Secondary | ICD-10-CM

## 2017-07-22 DIAGNOSIS — I5022 Chronic systolic (congestive) heart failure: Secondary | ICD-10-CM | POA: Diagnosis not present

## 2017-07-22 DIAGNOSIS — Z87891 Personal history of nicotine dependence: Secondary | ICD-10-CM | POA: Diagnosis not present

## 2017-07-22 DIAGNOSIS — Z79899 Other long term (current) drug therapy: Secondary | ICD-10-CM | POA: Diagnosis not present

## 2017-07-22 DIAGNOSIS — R1030 Lower abdominal pain, unspecified: Secondary | ICD-10-CM | POA: Diagnosis not present

## 2017-07-22 LAB — URINALYSIS, ROUTINE W REFLEX MICROSCOPIC
Bilirubin Urine: NEGATIVE
Glucose, UA: NEGATIVE mg/dL
Hgb urine dipstick: NEGATIVE
Ketones, ur: NEGATIVE mg/dL
Leukocytes, UA: NEGATIVE
NITRITE: NEGATIVE
PH: 6 (ref 5.0–8.0)
Protein, ur: NEGATIVE mg/dL
SPECIFIC GRAVITY, URINE: 1.02 (ref 1.005–1.030)

## 2017-07-22 MED ORDER — MORPHINE SULFATE (PF) 4 MG/ML IV SOLN
4.0000 mg | Freq: Once | INTRAVENOUS | Status: AC
  Administered 2017-07-22: 4 mg via INTRAVENOUS
  Filled 2017-07-22: qty 1

## 2017-07-22 MED ORDER — METHOCARBAMOL 1000 MG/10ML IJ SOLN
1000.0000 mg | Freq: Once | INTRAVENOUS | Status: DC
  Filled 2017-07-22: qty 10

## 2017-07-22 MED ORDER — KETOROLAC TROMETHAMINE 30 MG/ML IJ SOLN
30.0000 mg | Freq: Once | INTRAMUSCULAR | Status: AC
  Administered 2017-07-22: 30 mg via INTRAVENOUS
  Filled 2017-07-22: qty 1

## 2017-07-22 MED ORDER — ONDANSETRON HCL 4 MG/2ML IJ SOLN
4.0000 mg | Freq: Once | INTRAMUSCULAR | Status: AC
  Administered 2017-07-22: 4 mg via INTRAVENOUS
  Filled 2017-07-22: qty 2

## 2017-07-22 MED ORDER — METHOCARBAMOL 1000 MG/10ML IJ SOLN
INTRAMUSCULAR | Status: AC
  Administered 2017-07-22: 1000 mg
  Filled 2017-07-22: qty 10

## 2017-07-22 NOTE — ED Triage Notes (Signed)
Pt states she was seen here for lower abd pain/lower back pain 07/19/16-NAD-steady gait

## 2017-07-22 NOTE — Telephone Encounter (Signed)
Pt was seen at 07/19/17 at ED at [facility] but pt is still experiencing pain 6 - 9 pain level. I spoke with pt and she understands that with the pain level she needs eval done and I do not have available appt on 07/23/17 that could meet her needs. Pt will go to an ED for further evaluation and will cb to establish care at [facility]. FYI to Dr Deborra Medina.

## 2017-07-22 NOTE — Telephone Encounter (Signed)
Copied from [place]. Topic: Appointment Scheduling - Scheduling Inquiry for Clinic >> Jul 22, 2017  5:03 PM Conception Chancy, NT wrote: Reason for CRM:  patient is needing to transfer care from Dr. Deborra Medina. She is having abdominal pain and back hurting and is wanting to be seen tomorrow 07/23/17. Spoke with Rena and transferred patient to her to speak about symptoms.

## 2017-07-22 NOTE — ED Provider Notes (Signed)
[facility]ENCY DEPARTMENT Provider Note   CSN: [id number] Arrival date & time: 07/22/17  2039     History   Chief Complaint Chief Complaint  Patient presents with  . Abdominal Pain    HPI Michelle Kane is a 32 y.o. female.  The history is provided by the patient.  She has history of ovarian cysts, postoperative pelvic hematoma, peripartum cardiomyopathy, asthma, hypertension, chronic systolic heart failure and comes in with complaints of pain across her lower back and lower abdomen.  Symptoms started 3 days ago.  Pain is sharp and she rates it at 8/10.  It is worse with virtually any movement.  There was intermittent nausea when pain was severe, but she has not vomited.  She has not had any nausea for the last 2 days.  She denies any radiation of pain to the hips or legs.  She denies any numbness or tingling.  She denies of any bowel or bladder dysfunction.  She was seen in emergency 2 days ago and had CT scan which suggested an ovarian cyst and was sent for ultrasound to rule out ovarian torsion which showed that the cyst was actually remnants of her pelvic hematoma.  She has been taking ibuprofen and tramadol without relief.  She is applied heat which actually makes symptoms worse.  She denies any recent trauma or overactivity, but her job does involve lifting.  She has no prior history of back problems.  Past Medical History:  Diagnosis Date  . Allergy-induced asthma    Allergy induced asthma  . Anxiety   . 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 Chronic systolic CHF (congestive heart failure) ([place])   . Essential hypertension    Taken for EF function and tachycardia  . GERD (gastroesophageal reflux disease)   . Headache    migraines  . 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 History of colon polyps   . History of ovarian cyst   . Pelvic hematoma, female   . Pelvic pain in female   . Peripartum cardiomyopathy    a. EF 40-45% in 2016, varying  by several echoes. F/u echo 06/2015 showed EF 50%.   Marland Kitchen PONV (postoperative nausea and vomiting)   . Sinus tachycardia    a. 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 b. 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    Patient Active Problem List   Diagnosis Date Noted  . Post-operative complication 42/87/6811  . S/P laparoscopic assisted vaginal hysterectomy (LAVH) 02/04/2017  . Left knee pain 01/12/2017  . Lumbar spondylosis 01/12/2017  . Viral pharyngitis 12/20/2016  . Acute sinusitis 11/11/2016  . Morbid obesity ([place]) 11/11/2016  . Paresthesia 10/30/2016  . Lung nodule 08/26/2016  . Mild persistent asthma without complication 57/26/2035  . RLS (restless legs syndrome) 12/19/2015  . Anxiety state 12/19/2015  . Essential hypertension 10/09/2015  . Acute on chronic systolic CHF (congestive heart failure) ([place]) 10/09/2015  . Nonischemic congestive cardiomyopathy ([place]) 10/09/2015  . Dyspnea 07/20/2015  . SOB (shortness of breath) 07/12/2015  . Acute congestive heart failure ([place]) 06/06/2015  . Cardiomyopathy ([place]) 05/18/2015  . Hypertension in pregnancy, preeclampsia, severe 05/18/2015  . Inappropriate sinus tachycardia 04/13/2015  . H/O cardiomyopathy 04/13/2015  . H/O cardiovascular disorder 04/13/2015  . Chest pain 01/12/2015  . Palpitations 01/12/2015  . Decreased cardiac ejection fraction 01/12/2015  . Abdominal pain in pregnancy, antepartum   . Chronic female pelvic  pain 08/18/2014  . Severe obesity (BMI >= 40) ([place]) 01/12/2013  . Fatty liver 01/12/2013  . Adiposity 01/12/2013  . GERD (gastroesophageal reflux disease)   . Asthma with acute exacerbation 11/27/2011  . Excess, menstruation 11/27/2011    Past Surgical History:  Procedure Laterality Date  . ABDOMINAL HYSTERECTOMY    . CESAREAN SECTION  03-01-2008  and 2016  . IM PINNING RIGHT ELBOW FX  1992   HARDWARE REMOVED  . LAPAROSCOPIC ASSISTED VAGINAL HYSTERECTOMY Left  02/04/2017   Procedure: LAPAROSCOPIC ASSISTED VAGINAL HYSTERECTOMY, LEFT SALPINGOOPHORECTOMY;  Surgeon: Dian Queen, MD;  Location: [facility];  Service: Gynecology;  Laterality: Left;  NEED BED  . LAPAROSCOPY N/A 07/29/2014   Procedure: LAPAROSCOPY DIAGNOSTIC;  Surgeon: Cyril Mourning, MD;  Location[facility];  Service: Gynecology;  Laterality: N/A;  . OOPHORECTOMY Left 02/04/2017   Procedure: LEFT OOPHORECTOMY;  Surgeon: Dian Queen, MD;  Location: [facility];  Service: Gynecology;  Laterality: Left;    OB History    Gravida Para Term Preterm AB Living   4 2 2   1 2    SAB TAB Ectopic Multiple Live Births   1               Home Medications    Prior to Admission medications   Medication Sig Start Date End Date Taking? Authorizing Provider  albuterol (PROVENTIL HFA;VENTOLIN HFA) 108 (90 Base) MCG/ACT inhaler Inhale 2 puffs into the lungs every 6 (six) hours as needed for wheezing or shortness of breath. 04/10/17   [name], MD  amoxicillin (AMOXIL) 500 MG capsule Take 2 capsules (1,000 mg total) by mouth 3 (three) times daily. 04/05/17   Varney Biles, MD  amoxicillin-clavulanate (AUGMENTIN) 875-125 MG tablet Take 1 tablet by mouth 2 (two) times daily. 02/15/17   Tyson Dense, MD  azithromycin (ZITHROMAX) 250 MG tablet Take 1 tablet (250 mg total) by mouth daily. Take first 2 tablets together, then 1 every day until finished. 04/05/17   Varney Biles, MD  budesonide-formoterol (SYMBICORT) 80-4.5 MCG/ACT inhaler 2 pffs at bedtime 06/27/16   Tanda Rockers, MD  cephALEXin (KEFLEX) 500 MG capsule Take 1 capsule (500 mg total) by mouth 4 (four) times daily. 06/14/17   Law, Bea Graff, PA-C  diazepam (VALIUM) 2 MG tablet Take 1 tablet (2 mg total) by mouth every 12 (twelve) hours as needed for anxiety. 10/30/16   Lucille Passy, MD  famotidine (PEPCID) 20 MG tablet Take 1 tablet (20 mg total) by mouth at bedtime.  06/25/17   Lyda Jester M, PA-C  furosemide (LASIX) 40 MG tablet TAKE 1 TABLET BY MOUTH TWICE DAILY 10/10/16   [name], MD  gabapentin (NEURONTIN) 300 MG capsule Start with 1 tab po qhs X 1 week, then increase to 1 tab po bid X 1 week then 1 tab po tid prn 12/30/16   Gerda Diss, DO  ibuprofen (ADVIL,MOTRIN) 800 MG tablet Take 1 tablet (800 mg total) by mouth 3 (three) times daily. 06/14/17   Law, Bea Graff, PA-C  losartan (COZAAR) 25 MG tablet TAKE 1/2 TABLET BY MOUTH AT BEDTIME 10/10/16   [name], MD  methocarbamol (ROBAXIN) 500 MG tablet Take 1 tablet (500 mg total) by mouth every 6 (six) hours as needed for muscle spasms. 02/05/17   Dian Queen, MD  metoprolol tartrate (LOPRESSOR) 25 MG tablet Take 75 mg by mouth 2 (two) times daily.    [provider]  mupirocin cream (BACTROBAN) 2 % Apply 1 application topically 2 (two) times daily. 06/14/17   Law, Bea Graff, PA-C  oxyCODONE (OXY IR/ROXICODONE) 5 MG immediate release tablet Take 1-2 tablets (5-10 mg total) by mouth every 6 (six) hours as needed for severe pain. 02/15/17   Tyson Dense, MD  pantoprazole (PROTONIX) 40 MG tablet Take 1 tablet (40 mg total) by mouth daily. Take 30-60 min before first meal of the day 07/02/17   [name], MD  potassium chloride SA (K-DUR,KLOR-CON) 20 MEQ tablet Take 1 tab by mouth daily for one week, then take 1 tab by mouth daily only when you take lasix, thereafter. 11/11/16   [name], MD    Family History Family History  Problem Relation Age of Onset  . Asthma Mother   . Crohn's disease Mother   . Colon polyps Mother   . Hyperlipidemia Father   . Hemophilia Father   . COPD Father        smoked  . Hemophilia Son   . Heart attack Maternal Grandfather   . Diabetes Paternal Grandfather   . Stroke Paternal Grandfather   . Heart failure Brother   . Heart failure Paternal Uncle   . Heart failure Paternal Aunt   . COPD Maternal  Grandmother        smoked  . Lung cancer Maternal Grandmother        smoked  . Hypertension Neg Hx     Social History Social History   Tobacco Use  . Smoking status: Former Smoker    Packs/day: 0.50    Years: 10.00    Pack years: 5.00    Types: Cigarettes    Last attempt to quit: 02/13/2015    Years since quitting: 2.4  . Smokeless tobacco: Never Used  Substance Use Topics  . Alcohol use: Yes    Alcohol/week: 0.0 oz    Comment: social  . Drug use: No     Allergies   Vicodin [hydrocodone-acetaminophen]   Review of Systems Review of Systems  All other systems reviewed and are negative.    Physical Exam Updated Vital Signs BP 114/86 (BP Location: Right Arm)   Pulse 100   Temp 98.2 F (36.8 C) (Oral)   Resp 20   Ht 5\' 6"  (1.676 m)   Wt 113.4 kg (250 lb)   LMP 12/09/2016   SpO2 100%   BMI 40.35 kg/m   Physical Exam  Nursing note and vitals reviewed.  Obese 32 year old female, resting comfortably and in no acute distress. Vital signs are normal. Oxygen saturation is 100%, which is normal. Head is normocephalic and atraumatic. PERRLA, EOMI. Oropharynx is clear. Neck is nontender and supple without adenopathy or JVD. Back is tender in the mid and lower lumbar area with bilateral paralumbar spasm-left greater than right.  There is also paralumbar tenderness which is greater on the left.  Straight leg raise is positive bilaterally at 30 degrees.  There is no CVA tenderness. Lungs are clear without rales, wheezes, or rhonchi. Chest is nontender. Heart has regular rate and rhythm without murmur. Abdomen is soft, flat, nontender without masses or hepatosplenomegaly and peristalsis is normoactive. Extremities have no cyanosis or edema, full range of motion is present. Skin is warm and dry without rash. Neurologic: Mental status is normal, cranial nerves are intact, there are no motor or sensory deficits.  ED Treatments / Results  Labs (all labs ordered are listed,  but only abnormal results are  displayed) Labs Reviewed  URINALYSIS, ROUTINE W REFLEX MICROSCOPIC   Procedures Procedures (including critical care time)  Medications Ordered in ED Medications  methocarbamol (ROBAXIN) 1,000 mg in dextrose 5 % 50 mL IVPB (not administered)  ketorolac (TORADOL) 30 MG/ML injection 30 mg (not administered)  morphine 4 MG/ML injection 4 mg (not administered)  ondansetron (ZOFRAN) injection 4 mg (not administered)     Initial Impression / Assessment and Plan / ED Course  I have reviewed the triage vital signs and the nursing notes.  Lower back and abdominal pain which seems to be more musculoskeletal and more focused in the lower back.  I believe that this represents true musculoskeletal low back pain.  Old records are reviewed confirming postoperative pelvic hematoma in August, and recent ED visit with CT scan and ultrasound results as noted in history of present illness.  I do not feel that this hematoma is causing her symptoms.  It is relatively small and not position in a location that is likely to cause problems.  No indication for further imaging today.  She will be given IV ketorolac, methocarbamol, and morphine and reassessed.  She had marked improvement with above-noted treatment.  She is discharged with prescriptions for naproxen, orphenadrine, and oxycodone-acetaminophen.  Follow-up with her primary care provider.  Final Clinical Impressions(s) / ED Diagnoses   Final diagnoses:  Acute bilateral low back pain without sciatica    ED Discharge Orders        Ordered    naproxen (NAPROSYN) 500 MG tablet  2 times daily     07/23/17 0123    orphenadrine (NORFLEX) 100 MG tablet  2 times daily     07/23/17 0123    oxyCODONE-acetaminophen (PERCOCET) 5-325 MG tablet  Every 4 hours PRN     69/79/48 0165       Delora Fuel, MD 53/74/82 [phone]

## 2017-07-23 MED ORDER — NAPROXEN 500 MG PO TABS: 500.0000 mg | ORAL_TABLET | Freq: Two times a day (BID) | ORAL | 0 refills | Status: DC

## 2017-07-23 MED ORDER — OXYCODONE-ACETAMINOPHEN 5-325 MG PO TABS: 1.0000 | ORAL_TABLET | ORAL | 0 refills | Status: DC | PRN

## 2017-07-23 MED ORDER — ORPHENADRINE CITRATE ER 100 MG PO TB12: 100.0000 mg | ORAL_TABLET | Freq: Two times a day (BID) | ORAL | 0 refills | Status: DC

## 2017-07-23 NOTE — Discharge Instructions (Signed)
Apply ice several times a day. Return if pain is getting worse.

## 2017-08-27 DIAGNOSIS — N99840 Postprocedural hematoma of a genitourinary system organ or structure following a genitourinary system procedure: Secondary | ICD-10-CM | POA: Diagnosis not present

## 2017-08-27 DIAGNOSIS — F419 Anxiety disorder, unspecified: Secondary | ICD-10-CM | POA: Diagnosis not present

## 2017-09-15 ENCOUNTER — Ambulatory Visit: Payer: Managed Care, Other (non HMO) | Admitting: Internal Medicine

## 2017-09-15 ENCOUNTER — Encounter: Payer: Self-pay | Admitting: Internal Medicine

## 2017-09-15 VITALS — BP 120/84 | HR 94 | Temp 98.4°F | Wt 264.0 lb

## 2017-09-15 DIAGNOSIS — G43C0 Periodic headache syndromes in child or adult, not intractable: Secondary | ICD-10-CM | POA: Diagnosis not present

## 2017-09-15 DIAGNOSIS — K219 Gastro-esophageal reflux disease without esophagitis: Secondary | ICD-10-CM

## 2017-09-15 DIAGNOSIS — I420 Dilated cardiomyopathy: Secondary | ICD-10-CM | POA: Diagnosis not present

## 2017-09-15 DIAGNOSIS — G43909 Migraine, unspecified, not intractable, without status migrainosus: Secondary | ICD-10-CM | POA: Insufficient documentation

## 2017-09-15 DIAGNOSIS — J029 Acute pharyngitis, unspecified: Secondary | ICD-10-CM | POA: Diagnosis not present

## 2017-09-15 DIAGNOSIS — I10 Essential (primary) hypertension: Secondary | ICD-10-CM

## 2017-09-15 DIAGNOSIS — J4530 Mild persistent asthma, uncomplicated: Secondary | ICD-10-CM

## 2017-09-15 DIAGNOSIS — F411 Generalized anxiety disorder: Secondary | ICD-10-CM

## 2017-09-15 LAB — POCT RAPID STREP A (OFFICE): Rapid Strep A Screen: NEGATIVE

## 2017-09-15 MED ORDER — AMOXICILLIN 875 MG PO TABS: 875.0000 mg | ORAL_TABLET | Freq: Two times a day (BID) | ORAL | 0 refills | Status: DC

## 2017-09-15 MED ORDER — PANTOPRAZOLE SODIUM 40 MG PO TBEC: 40.0000 mg | DELAYED_RELEASE_TABLET | Freq: Every day | ORAL | 3 refills | Status: DC

## 2017-09-15 MED ORDER — BUDESONIDE-FORMOTEROL FUMARATE 80-4.5 MCG/ACT IN AERO: INHALATION_SPRAY | RESPIRATORY_TRACT | 5 refills | Status: DC

## 2017-09-15 MED ORDER — FAMOTIDINE 20 MG PO TABS: 20.0000 mg | ORAL_TABLET | Freq: Every day | ORAL | 3 refills | Status: DC

## 2017-09-15 NOTE — Progress Notes (Signed)
HPI  Pt presents to the clinic today to establish care and for management of the conditions listed below. She is transferring care from Dr. Deborra Medina.  Allergy Induced Asthma: Controlled on Symbicort. She denies chronic cough or SOB. She rarely uses Albuterol. She needs a refill of Symbicort today.  Anxiety: Triggered by general stress. She wass taking Buspar, but she reports this causes nausea and worsening migraines. She follows with Dr. Buddy Duty.  CHF, Systolic: She still takes Metoprolol daily and Lasix as needed. She denies cough, shortness of breath or lower extremity edema.  HTN:  Pregnancy induced. She is not taking any antihypertensive medication at this time.  GERD: Triggered by tomato based foods and chocolate. Controlled on Pantoprazole and Famotidine. She is requesting a refill of these medication today.  Migraines: These were occurring daily while on Buspar but better now that she is off it. She is taking Metoprolol as prescribed. She takes Ibuprofen a needed   She also c/o sore throat. This started yesterday. She does have some discomfort with swallowing. She has noticed white patches on her throat. She has not tried anything OTC for her symptoms.  Flu: 04/2017 Tetanus: 01/2013, hysterotomy  Pap Smear: as needed Dentist: as needed  Past Medical History:  Diagnosis Date  . Allergy-induced asthma    Allergy induced asthma  . Anxiety   . 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 Chronic systolic CHF (congestive heart failure) ([place])   . Essential hypertension    Taken for EF function and tachycardia  . GERD (gastroesophageal reflux disease)   . Headache    migraines  . 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 History of colon polyps   . History of ovarian cyst   . Pelvic hematoma, female   . Pelvic pain in female   . Peripartum cardiomyopathy    a. EF 40-45% in 2016, varying by several echoes. F/u echo 06/2015 showed EF 50%.   Marland Kitchen PONV (postoperative nausea and  vomiting)   . Sinus tachycardia    a. 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 b. 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    Current Outpatient Medications  Medication Sig Dispense Refill  . albuterol (PROVENTIL HFA;VENTOLIN HFA) 108 (90 Base) MCG/ACT inhaler Inhale 2 puffs into the lungs every 6 (six) hours as needed for wheezing or shortness of breath. 1 Inhaler 2  . budesonide-formoterol (SYMBICORT) 80-4.5 MCG/ACT inhaler 2 pffs at bedtime 1 Inhaler 11  . diazepam (VALIUM) 2 MG tablet Take 1 tablet (2 mg total) by mouth every 12 (twelve) hours as needed for anxiety. 30 tablet 0  . famotidine (PEPCID) 20 MG tablet Take 1 tablet (20 mg total) by mouth at bedtime. 30 tablet 2  . furosemide (LASIX) 40 MG tablet TAKE 1 TABLET BY MOUTH TWICE DAILY 180 tablet 2  . gabapentin (NEURONTIN) 300 MG capsule Start with 1 tab po qhs X 1 week, then increase to 1 tab po bid X 1 week then 1 tab po tid prn 90 capsule 1  . losartan (COZAAR) 25 MG tablet TAKE 1/2 TABLET BY MOUTH AT BEDTIME 45 tablet 2  . metoprolol tartrate (LOPRESSOR) 25 MG tablet Take 75 mg by mouth 2 (two) times daily.    . naproxen (NAPROSYN) 500 MG tablet Take 1 tablet (500 mg total) by mouth 2 (two) times daily. 30 tablet 0  . orphenadrine (NORFLEX) 100 MG tablet Take 1 tablet (100  mg total) by mouth 2 (two) times daily. 30 tablet 0  . oxyCODONE-acetaminophen (PERCOCET) 5-325 MG tablet Take 1 tablet by mouth every 4 (four) hours as needed for moderate pain. 15 tablet 0  . pantoprazole (PROTONIX) 40 MG tablet Take 1 tablet (40 mg total) by mouth daily. Take 30-60 min before first meal of the day 30 tablet 3  . potassium chloride SA (K-DUR,KLOR-CON) 20 MEQ tablet Take 1 tab by mouth daily for one week, then take 1 tab by mouth daily only when you take lasix, thereafter. 30 tablet 3   No current facility-administered medications for this visit.     Allergies  Allergen Reactions  . Vicodin  [Hydrocodone-Acetaminophen] Itching, Nausea And Vomiting and Other (See Comments)    GI pains also    Family History  Problem Relation Age of Onset  . Asthma Mother   . Crohn's disease Mother   . Colon polyps Mother   . Hyperlipidemia Father   . Hemophilia Father   . COPD Father        smoked  . Hemophilia Son   . Heart attack Maternal Grandfather   . Diabetes Paternal Grandfather   . Stroke Paternal Grandfather   . Heart failure Brother   . Heart failure Paternal Uncle   . Heart failure Paternal Aunt   . COPD Maternal Grandmother        smoked  . Lung cancer Maternal Grandmother        smoked  . Hypertension Neg Hx     Social History   Socioeconomic History  . Marital status: Married    Spouse name: Not on file  . Number of children: Not on file  . Years of education: Not on file  . Highest education level: Not on file  Social Needs  . Financial resource strain: Not on file  . Food insecurity - worry: Not on file  . Food insecurity - inability: Not on file  . Transportation needs - medical: Not on file  . Transportation needs - non-medical: Not on file  Occupational History  . Occupation: CNA   Tobacco Use  . Smoking status: Former Smoker    Packs/day: 0.50    Years: 10.00    Pack years: 5.00    Types: Cigarettes    Last attempt to quit: 02/13/2015    Years since quitting: 2.5  . Smokeless tobacco: Never Used  Substance and Sexual Activity  . Alcohol use: Yes    Alcohol/week: 0.0 oz    Comment: social  . Drug use: No  . Sexual activity: Yes    Birth control/protection: Surgical  Other Topics Concern  . Not on file  Social History Narrative  . Not on file    ROS:  Constitutional: Denies fever, malaise, fatigue, headache or abrupt weight changes.  HEENT: Pt reports sore throat. Denies eye pain, eye redness, ear pain, ringing in the ears, wax buildup, runny nose, nasal congestion, bloody nose. Respiratory: Denies difficulty breathing, shortness of  breath, cough or sputum production.   Cardiovascular: Denies chest pain, chest tightness, palpitations or swelling in the hands or feet.  Gastrointestinal: Denies abdominal pain, bloating, constipation, diarrhea or blood in the stool.  GU: Denies frequency, urgency, pain with urination, blood in urine, odor or discharge. Musculoskeletal: Denies decrease in range of motion, difficulty with gait, muscle pain or joint pain and swelling.  Skin: Denies redness, rashes, lesions or ulcercations.  Neurological: Denies dizziness, difficulty with memory, difficulty with speech or problems with  balance and coordination.  Psych: Pt has a history of anxiety. Denies depression, SI/HI.  No other specific complaints in a complete review of systems (except as listed in HPI above).  PE:  BP 120/84   Pulse 94   Temp 98.4 F (36.9 C) (Oral)   Wt 264 lb (119.7 kg)   LMP 12/09/2016   SpO2 97%   BMI 42.61 kg/m   Wt Readings from Last 3 Encounters:  07/22/17 250 lb (113.4 kg)  07/19/17 250 lb (113.4 kg)  06/14/17 250 lb (113.4 kg)    General: Appears her  stated age, obese in NAD. HEENT: Head: normal shape and size, no sinus tenderness noted; Throat/Mouth: Teeth present, mucosa erythematous and moist, + exudate on bilateral tonsillar pillars, no lesions or ulcerations noted.  Neck: Bilateral anterior cervical adenopathy noted.  Cardiovascular: Normal rate and rhythm. S1,S2 noted.  No murmur, rubs or gallops noted. No JVD or BLE edema. Pulmonary/Chest: Normal effort and positive vesicular breath sounds. No respiratory distress. No wheezes, rales or ronchi noted.  Abdomen: Soft and nontender. Normal bowel sounds, no bruits noted.  Neurological: Alert and oriented.  Psychiatric: Mood and affect normal. Behavior is normal. Judgment and thought content normal.    BMET    Component Value Date/Time   NA 136 07/19/2017 2330   K 3.8 07/19/2017 2330   CL 109 07/19/2017 2330   CO2 21 (L) 07/19/2017 2330    GLUCOSE 97 07/19/2017 2330   BUN 8 07/19/2017 2330   CREATININE 0.59 07/19/2017 2330   CREATININE 0.69 03/14/2016 0940   CALCIUM 8.7 (L) 07/19/2017 2330   GFRNONAA >60 07/19/2017 2330   GFRAA >60 07/19/2017 2330    Lipid Panel     Component Value Date/Time   CHOL 190 12/19/2015 0946   TRIG 194.0 (H) 12/19/2015 0946   HDL 36.20 (L) 12/19/2015 0946   CHOLHDL 5 12/19/2015 0946   VLDL 38.8 12/19/2015 0946   LDLCALC 115 (H) 12/19/2015 0946    CBC    Component Value Date/Time   WBC 10.1 07/19/2017 2330   RBC 4.17 07/19/2017 2330   HGB 12.7 07/19/2017 2330   HCT 38.2 07/19/2017 2330   PLT 198 07/19/2017 2330   MCV 91.6 07/19/2017 2330   MCH 30.5 07/19/2017 2330   MCHC 33.2 07/19/2017 2330   RDW 13.4 07/19/2017 2330   LYMPHSABS 3.2 07/19/2017 2330   MONOABS 0.6 07/19/2017 2330   EOSABS 0.4 07/19/2017 2330   BASOSABS 0.0 07/19/2017 2330    Hgb A1C Lab Results  Component Value Date   HGBA1C 5.1 01/12/2013     Assessment and Plan:  Sore Throat:  RST: negative Will treat based on objective finding eRx for Amoxil 875 mg BID x 10 days Ibuprofen as needed Salt water gargles may be helpful  RTC in 1 year, for your annual exam Webb Silversmith, NP

## 2017-09-15 NOTE — Assessment & Plan Note (Signed)
Pregnancy induced Controlled on Metoprolol Will monitor

## 2017-09-15 NOTE — Assessment & Plan Note (Signed)
Buspar had too many side effects She is waiting for an appt with Dr. Buddy Duty

## 2017-09-15 NOTE — Assessment & Plan Note (Signed)
Stable on Metoprolol and Lasix

## 2017-09-15 NOTE — Patient Instructions (Addendum)

## 2017-09-15 NOTE — Addendum Note (Signed)
Addended by: Lurlean Nanny on: 09/15/2017 02:55 PM   Modules accepted: Orders

## 2017-09-15 NOTE — Assessment & Plan Note (Signed)
Controlled on Pantoprazole and Famotadine, refilled today Discussed how weight loss can help improve reflux

## 2017-09-15 NOTE — Assessment & Plan Note (Signed)
Controlled on Metoprolol and Ibuprofen Will monitor

## 2017-09-15 NOTE — Assessment & Plan Note (Signed)
Controlled on Symbicort Refilled today

## 2017-11-25 ENCOUNTER — Telehealth: Payer: Self-pay | Admitting: Cardiology

## 2017-11-25 ENCOUNTER — Other Ambulatory Visit: Payer: Self-pay | Admitting: *Deleted

## 2017-11-25 MED ORDER — FUROSEMIDE 40 MG PO TABS: 40.0000 mg | ORAL_TABLET | Freq: Two times a day (BID) | ORAL | 2 refills | Status: DC

## 2017-11-25 NOTE — Telephone Encounter (Signed)
Lasix should be 40 mg po daily as needed. KDur on days she has to use lasix. This was incorrectly filled a year ago by a former CMA.   Dr Meda Coffee, please advise if you want her to take lasix 40 mg po daily or lasix 40 mg po daily as needed for weight gain and/or fluid retention? Please advise.

## 2017-11-25 NOTE — Telephone Encounter (Signed)
New message     *STAT* If patient is at the pharmacy, call can be transferred to refill team.   1. Which medications need to be refilled? (please list name of each medication and dose if known) furosemide (LASIX) 40 MG tablet  2. Which pharmacy/location (including street and city if local pharmacy) is medication to be sent to?[id number] - [place],  - [address] AT [address]  3. Do they need a 30 day or 90 day supply? [place]

## 2017-11-25 NOTE — Telephone Encounter (Signed)
Should this be prn, qd or bid? Per last office visit with Dr Meda Coffee ASSESSMENT AND PLAN:   1. Chronic systolic CHF - the patient appears euvolemic today, NYHA class II. Lasix only as needed. I will prescribe KCl 20 mEq daily to be taken daily x  Weeks and then with each lasix 40 mg po daily. We discussed continuation of daily weights and low sodium diet. Continue daily Lasix  Please advise. Thanks, MI

## 2017-11-26 NOTE — Telephone Encounter (Signed)
Outpatient Medication Detail    Disp Refills Start End   furosemide (LASIX) 40 MG tablet 180 tablet 2 11/25/2017    Sig - Route: Take 1 tablet (40 mg total) by mouth 2 (two) times daily. - Oral   Sent to pharmacy as: furosemide (LASIX) 40 MG tablet   E-Prescribing Status: Receipt confirmed by pharmacy (11/25/2017 7:32 PM EDT)   Pharmacy   [id number] - [place], [place] - [address] AT [address]

## 2017-12-09 ENCOUNTER — Encounter (HOSPITAL_BASED_OUTPATIENT_CLINIC_OR_DEPARTMENT_OTHER): Payer: Self-pay

## 2017-12-09 ENCOUNTER — Emergency Department (HOSPITAL_BASED_OUTPATIENT_CLINIC_OR_DEPARTMENT_OTHER): Payer: Managed Care, Other (non HMO)

## 2017-12-09 ENCOUNTER — Other Ambulatory Visit: Payer: Self-pay

## 2017-12-09 DIAGNOSIS — R0602 Shortness of breath: Secondary | ICD-10-CM | POA: Diagnosis not present

## 2017-12-09 DIAGNOSIS — Z5321 Procedure and treatment not carried out due to patient leaving prior to being seen by health care provider: Secondary | ICD-10-CM | POA: Diagnosis not present

## 2017-12-09 NOTE — ED Triage Notes (Signed)
Pt c/o SOB x today--14 lb weight gain in 4 days-states she has hx of CHF and takes lasix-NAD-steady gait

## 2017-12-10 ENCOUNTER — Emergency Department (HOSPITAL_BASED_OUTPATIENT_CLINIC_OR_DEPARTMENT_OTHER)
Admission: EM | Admit: 2017-12-10 | Discharge: 2017-12-10 | Disposition: A | Discharge status: Home / Self Care | Payer: Managed Care, Other (non HMO) | Attending: Emergency Medicine | Admitting: Emergency Medicine

## 2017-12-10 NOTE — ED Notes (Signed)
Pt informed registration that she was leaving.  

## 2017-12-10 NOTE — ED Notes (Signed)
Follow up call made  No answer  1300 12/10/17 s Clarabel Marion rn

## 2017-12-31 ENCOUNTER — Encounter: Payer: Self-pay | Admitting: Physician Assistant

## 2017-12-31 ENCOUNTER — Ambulatory Visit: Payer: Managed Care, Other (non HMO) | Admitting: Physician Assistant

## 2017-12-31 VITALS — BP 110/62 | HR 114 | Ht 66.0 in | Wt 264.0 lb

## 2017-12-31 DIAGNOSIS — I10 Essential (primary) hypertension: Secondary | ICD-10-CM | POA: Diagnosis not present

## 2017-12-31 DIAGNOSIS — Z87898 Personal history of other specified conditions: Secondary | ICD-10-CM | POA: Insufficient documentation

## 2017-12-31 DIAGNOSIS — Z8249 Family history of ischemic heart disease and other diseases of the circulatory system: Secondary | ICD-10-CM | POA: Diagnosis not present

## 2017-12-31 DIAGNOSIS — R000 Tachycardia, unspecified: Secondary | ICD-10-CM | POA: Diagnosis not present

## 2017-12-31 DIAGNOSIS — I420 Dilated cardiomyopathy: Secondary | ICD-10-CM

## 2017-12-31 MED ORDER — FUROSEMIDE 40 MG PO TABS: 40.0000 mg | ORAL_TABLET | Freq: Two times a day (BID) | ORAL | 3 refills | Status: DC

## 2017-12-31 MED ORDER — POTASSIUM CHLORIDE CRYS ER 20 MEQ PO TBCR: 20.0000 meq | EXTENDED_RELEASE_TABLET | Freq: Two times a day (BID) | ORAL | 3 refills | Status: DC

## 2017-12-31 MED ORDER — METOPROLOL TARTRATE 50 MG PO TABS: 50.0000 mg | ORAL_TABLET | Freq: Two times a day (BID) | ORAL | 3 refills | Status: DC

## 2017-12-31 NOTE — Progress Notes (Signed)
Cardiology Office Note    Date:  12/31/2017   ID:  Michelle Kane, DOB 10/08/1985, MRN [id number]  PCP:  Jearld Fenton, NP  Cardiologist: Ena Dawley, MD  Chief Complaint  Patient presents with  . Follow-up    History of Present Illness:  Michelle Kane is a 32 y.o. female who works at [employer] in [place].  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  She also has had inappropriate sinus tachycardia treated with beta-blocker, GERD, allergy induced asthma.  Last saw Dr. Meda Coffee 11/11/2016 complaining of dyspnea on exertion.  Patient was euvolemic and took Lasix only as needed.  Long history of chest pain CTA 08/2016 calcium score was 0 no evidence of CAD.  Patient was in the ED 12/09/2017 with shortness of breath 14 pound weight gain but signed herself out.  Patient comes in today for follow-up.  She said she has had worsening trouble with swelling since the weather has gotten hot and she has resumed Lasix 40 mg twice daily.  This is controlling it pretty well.  She has not taken potassium with it.  Heart rate is 114 bpm when she walked in here today.  Heart rate came down to 91 time we did the EKG.  She says she is been under a lot of stress and anxiety.  She says is usually 80 she checks it every day.  She stopped her metoprolol over a year ago.  She says she discussed this with Dr. Meda Coffee although notes from 10/2016 indicates she should continue metoprolol.  Notes also indicate she was on losartan at that time the patient said it was stopped because of low blood pressures.  She exercises on the elliptical 15 to 20 minutes 2 to 3 days a week.  Heart rate gets up to 160 when she is exercising.   Past Medical History:  Diagnosis Date  . Allergy-induced asthma    Allergy induced asthma  . Anxiety   . 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 Chronic systolic CHF (congestive heart failure) ([place])   . Essential hypertension    Taken for EF function and tachycardia  . GERD (gastroesophageal reflux disease)   . Headache    migraines  . 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 History of colon polyps   . History of ovarian cyst   . Pelvic hematoma, female   . Pelvic pain in female   . Peripartum cardiomyopathy    a. EF 40-45% in 2016, varying by several echoes. F/u echo 06/2015 showed EF 50%.   Marland Kitchen PONV (postoperative nausea and vomiting)   . Sinus tachycardia    a. 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 b. 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    Past Surgical History:  Procedure Laterality Date  . ABDOMINAL HYSTERECTOMY    . CESAREAN SECTION  03-01-2008  and 2016  . IM PINNING RIGHT ELBOW FX  1992   HARDWARE REMOVED  . LAPAROSCOPIC ASSISTED VAGINAL HYSTERECTOMY Left 02/04/2017   Procedure: LAPAROSCOPIC ASSISTED VAGINAL HYSTERECTOMY, LEFT SALPINGOOPHORECTOMY;  Surgeon: Dian Queen, MD;  Location: [facility];  Service: Gynecology;  Laterality: Left;  NEED BED  . LAPAROSCOPY N/A 07/29/2014   Procedure: LAPAROSCOPY DIAGNOSTIC;  Surgeon: Cyril Mourning, MD;  Location: [facility];  Service: Gynecology;  Laterality: N/A;  . OOPHORECTOMY Left 02/04/2017   Procedure: LEFT OOPHORECTOMY;  Surgeon: Dian Queen, MD;  Location: [facility];  Service: Gynecology;  Laterality: Left;    Current Medications: Current Meds  Medication Sig  . albuterol (PROVENTIL HFA;VENTOLIN HFA) 108 (90 Base) MCG/ACT inhaler Inhale 2 puffs into the lungs every 6 (six) hours as needed for wheezing or shortness of breath.  . budesonide-formoterol (SYMBICORT) 80-4.5 MCG/ACT inhaler 2 pffs at bedtime  . famotidine (PEPCID) 20 MG tablet Take 1 tablet (20 mg total) by mouth at bedtime.  . furosemide (LASIX) 40 MG tablet Take 1 tablet (40 mg total) by  mouth 2 (two) times daily.  . pantoprazole (PROTONIX) 40 MG tablet Take 1 tablet (40 mg total) by mouth daily. Take 30-60 min before first meal of the day  . [DISCONTINUED] furosemide (LASIX) 40 MG tablet Take 1 tablet (40 mg total) by mouth 2 (two) times daily.     Allergies:   Vicodin [hydrocodone-acetaminophen]   Social History   Socioeconomic History  . Marital status: Married    Spouse name: Not on file  . Number of children: Not on file  . Years of education: Not on file  . Highest education level: Not on file  Occupational History  . Occupation: CNA   Social Needs  . Financial resource strain: Not on file  . Food insecurity:    Worry: Not on file    Inability: Not on file  . Transportation needs:    Medical: Not on file    Non-medical: Not on file  Tobacco Use  . Smoking status: Former Smoker    Packs/day: 0.50    Years: 10.00    Pack years: 5.00    Types: Cigarettes    Last attempt to quit: 02/13/2015    Years since quitting: 2.8  . Smokeless tobacco: Never Used  Substance and Sexual Activity  . Alcohol use: Yes    Alcohol/week: 0.0 oz    Comment: social  . Drug use: No  . Sexual activity: Yes    Birth control/protection: Surgical  Lifestyle  . Physical activity:    Days per week: Not on file    Minutes per session: Not on file  . Stress: Not on file  Relationships  . Social connections:    Talks on phone: Not on file    Gets together: Not on file    Attends religious service: Not on file    Active member of club or organization: Not on file    Attends meetings of clubs or organizations: Not on file    Relationship status: Not on file  Other Topics Concern  . Not on file  Social History Narrative  . Not on file     Family History:  The patient's family history includes Asthma in her mother; COPD in her father and maternal grandmother; Colon polyps in her mother; Crohn's disease in her mother; Diabetes in her paternal grandfather; Heart attack in her  maternal grandfather; Heart failure in her brother, paternal aunt, and paternal uncle; Hemophilia in her father and son; Hyperlipidemia in her father; Lung cancer in her maternal grandmother; Stroke in her paternal grandfather.   ROS:   Please see the history of present illness.    Review of Systems  Constitution: Negative.  HENT: Negative.   Eyes: Negative.   Cardiovascular: Negative.   Respiratory: Negative.   Hematologic/Lymphatic: Negative.   Musculoskeletal: Negative.  Negative for joint pain.  Gastrointestinal: Negative.   Genitourinary: Negative.   Neurological: Negative.   Psychiatric/Behavioral: Positive for depression. The patient is nervous/anxious.    All other systems reviewed and are negative.   PHYSICAL EXAM:   VS:  BP 110/62   Pulse (!) 114   Ht '5\' 6"'  (1.676 m)   Wt 264 lb (119.7 kg)   LMP 12/09/2016   SpO2 98%   BMI 42.61 kg/m   Physical Exam  GEN: Obesity, in no acute distress  Neck: no JVD, carotid bruits, or masses Cardiac:RRR; no murmurs, rubs, or gallops  Respiratory:  clear to auscultation bilaterally, normal work of breathing GI: soft, nontender, nondistended, + BS Ext: without cyanosis, clubbing, or edema, Good distal pulses bilaterally MS: no deformity or atrophy  Skin: warm and dry, no rash Neuro:  Alert and Oriented x 3, Strength and sensation are intact Psych: euthymic mood, full affect  Wt Readings from Last 3 Encounters:  12/31/17 264 lb (119.7 kg)  12/09/17 270 lb 15.1 oz (122.9 kg)  09/15/17 264 lb (119.7 kg)      Studies/Labs Reviewed:   EKG:  EKG is  ordered today.  The ekg ordered today demonstrates normal sinus rhythm at 91 bpm, no acute change  Recent Labs: 07/19/2017: ALT 14; BUN 8; Creatinine, Ser 0.59; Hemoglobin 12.7; Platelets 198; Potassium 3.8; Sodium 136   Lipid Panel    Component Value Date/Time   CHOL 190 12/19/2015 0946   TRIG 194.0 (H) 12/19/2015 0946   HDL 36.20 (L) 12/19/2015 0946   CHOLHDL 5 12/19/2015 0946    VLDL 38.8 12/19/2015 0946   LDLCALC 115 (H) 12/19/2015 0946    Additional studies/ records that were reviewed today include:  CT 2/2018IMPRESSION: 1. Coronary calcium score of 0. This was 0 percentile for age and sex matched control.   2. Normal coronary origin with right dominance.   3. No evidence of CAD.   2D echo 6/2017Study Conclusions   - Left ventricle: The cavity size was mildly dilated. Systolic   function was normal. The estimated ejection fraction was in the   range of 50% to 55%. Wall motion was normal; there were no   regional wall motion abnormalities. Left ventricular diastolic   function parameters were normal. - Aortic valve: Transvalvular velocity was within the normal range.   There was no stenosis. There was no regurgitation. - Mitral valve: Transvalvular velocity was within the normal range.   There was no evidence for stenosis. There was trivial   regurgitation. - Right ventricle: The cavity size was normal. Wall thickness was   normal. - Tricuspid valve: There was no regurgitation.     ASSESSMENT:    1. Nonischemic congestive cardiomyopathy ([place])   2. Inappropriate sinus tachycardia   3. Essential hypertension   4. History of chest pain   5. Family history of early CAD      PLAN:  In order of problems listed above:  Nonischemic cardiomyopathy LVEF normalized on last echo 2017.  Cardiomyopathy felt to be pregnancy-induced and possibly familial.  Now with worsening symptoms of edema requiring Lasix 40 mg twice daily when she was only taken it as needed before.  She was also tachycardic when she walked in here.  Has not been on losartan or metoprolol for unclear reasons.  Will check 2D echo to make sure her LV function is still normal.  Resume metoprolol 50 mg twice daily.  Hold off on the losartan because of concerns for low blood pressure.  Add potassium  20 mEq twice daily.  Check be met today.  Follow-up with Dr. Meda Coffee next available.  History  of inappropriate sinus tachycardia previously on metoprolol.  Has been's off for over a year for unclear reasons.  Could be contributing to her worsening edema.  Resume metoprolol 50 mg twice daily.  Essential hypertension blood pressure has been normal and/or controlled with Lasix.  History of chest pain with calcium score of 0 and no CAD on CT scan 08/2016  Family history of early CAD father prior CABG recently had a drop in his ejection fraction    Medication Adjustments/Labs and Tests Ordered: Current medicines are reviewed at length with the patient today.  Concerns regarding medicines are outlined above.  Medication changes, Labs and Tests ordered today are listed in the Patient Instructions below. Patient Instructions  Medication Instructions:  Your physician has recommended you make the following change in your medication:  1.  START Metoprolol 50 mg taking 1 tablet twice a day 2.  START Potassium 20 meq taking 1 tablet twice a day  Labwork: TODAY:  BMET & TSH  Testing/Procedures: Your physician has requested that you have an echocardiogram. Echocardiography is a painless test that uses sound waves to create images of your heart. It provides your doctor with information about the size and shape of your heart and how well your heart's chambers and valves are working. This procedure takes approximately one hour. There are no restrictions for this procedure.   Follow-Up:  Your physician recommends that you schedule a follow-up appointment in: [place]   Any Other Special Instructions Will Be Listed Below (If Applicable).  Echocardiogram An echocardiogram, or echocardiography, uses sound waves (ultrasound) to produce an image of your heart. The echocardiogram is simple, painless, obtained within a short period of time, and offers valuable information to your health care provider. The images from an echocardiogram can provide information such as:  Evidence of  coronary artery disease (CAD).  Heart size.  Heart muscle function.  Heart valve function.  Aneurysm detection.  Evidence of a past heart attack.  Fluid buildup around the heart.  Heart muscle thickening.  Assess heart valve function.  Tell a health care provider about:  Any allergies you have.  All medicines you are taking, including vitamins, herbs, eye drops, creams, and over-the-counter medicines.  Any problems you or family members have had with anesthetic medicines.  Any blood disorders you have.  Any surgeries you have had.  Any medical conditions you have.  Whether you are pregnant or may be pregnant. What happens before the procedure? No special preparation is needed. Eat and drink normally. What happens during the procedure?  In order to produce an image of your heart, gel will be applied to your chest and a wand-like tool (transducer) will be moved over your chest. The gel will help transmit the sound waves from the transducer. The sound waves will harmlessly bounce off your heart to allow the heart images to be captured in real-time motion. These images will then be recorded.  You may need an IV to receive a medicine that improves the quality of the pictures. What happens after the procedure? You may return to your normal schedule including diet, activities, and medicines, unless your health care provider tells you otherwise. This information is not intended to replace advice given to you by your health care provider. Make sure you discuss any questions you have with your health care provider. Document Released: 06/28/2000 Document Revised:  02/17/2016 Document Reviewed: 03/08/2013 Elsevier Interactive Patient Education  2017 [employer].    If you need a refill on your cardiac medications before your next appointment, please call your pharmacy.      Signed, Ermalinda Barrios, PA-C  12/31/2017 2:11 PM    [facility] [place], [place], [place]  [address] Phone: [phone]; Fax: [phone]

## 2017-12-31 NOTE — Patient Instructions (Addendum)
Medication Instructions:  Your physician has recommended you make the following change in your medication:  1.  START Metoprolol 50 mg taking 1 tablet twice a day 2.  START Potassium 20 meq taking 1 tablet twice a day  Labwork: TODAY:  BMET & TSH  Testing/Procedures: Your physician has requested that you have an echocardiogram. Echocardiography is a painless test that uses sound waves to create images of your heart. It provides your doctor with information about the size and shape of your heart and how well your heart's chambers and valves are working. This procedure takes approximately one hour. There are no restrictions for this procedure.   Follow-Up:  Your physician recommends that you schedule a follow-up appointment in: [place]   Any Other Special Instructions Will Be Listed Below (If Applicable).  Echocardiogram An echocardiogram, or echocardiography, uses sound waves (ultrasound) to produce an image of your heart. The echocardiogram is simple, painless, obtained within a short period of time, and offers valuable information to your health care provider. The images from an echocardiogram can provide information such as:  Evidence of coronary artery disease (CAD).  Heart size.  Heart muscle function.  Heart valve function.  Aneurysm detection.  Evidence of a past heart attack.  Fluid buildup around the heart.  Heart muscle thickening.  Assess heart valve function.  Tell a health care provider about:  Any allergies you have.  All medicines you are taking, including vitamins, herbs, eye drops, creams, and over-the-counter medicines.  Any problems you or family members have had with anesthetic medicines.  Any blood disorders you have.  Any surgeries you have had.  Any medical conditions you have.  Whether you are pregnant or may be pregnant. What happens before the procedure? No special preparation is needed. Eat and drink  normally. What happens during the procedure?  In order to produce an image of your heart, gel will be applied to your chest and a wand-like tool (transducer) will be moved over your chest. The gel will help transmit the sound waves from the transducer. The sound waves will harmlessly bounce off your heart to allow the heart images to be captured in real-time motion. These images will then be recorded.  You may need an IV to receive a medicine that improves the quality of the pictures. What happens after the procedure? You may return to your normal schedule including diet, activities, and medicines, unless your health care provider tells you otherwise. This information is not intended to replace advice given to you by your health care provider. Make sure you discuss any questions you have with your health care provider. Document Released: 06/28/2000 Document Revised: 02/17/2016 Document Reviewed: 03/08/2013 Elsevier Interactive Patient Education  2017 [employer].    If you need a refill on your cardiac medications before your next appointment, please call your pharmacy.

## 2018-01-01 LAB — BASIC METABOLIC PANEL
BUN/Creatinine Ratio: 12 (ref 9–23)
BUN: 10 mg/dL (ref 6–20)
CO2: 25 mmol/L (ref 20–29)
Calcium: 9.2 mg/dL (ref 8.7–10.2)
Chloride: 99 mmol/L (ref 96–106)
Creatinine, Ser: 0.82 mg/dL (ref 0.57–1.00)
GFR, EST AFRICAN AMERICAN: 110 mL/min/{1.73_m2} (ref >59)
GFR, EST NON AFRICAN AMERICAN: 96 mL/min/{1.73_m2} (ref >59)
Glucose: 88 mg/dL (ref 65–99)
POTASSIUM: 3.4 mmol/L — AB (ref 3.5–5.2)
SODIUM: 141 mmol/L (ref 134–144)

## 2018-01-01 LAB — TSH: TSH: 1.01 u[IU]/mL (ref 0.450–4.500)

## 2018-01-12 ENCOUNTER — Other Ambulatory Visit: Payer: Self-pay

## 2018-01-12 ENCOUNTER — Ambulatory Visit (HOSPITAL_COMMUNITY): Payer: Managed Care, Other (non HMO) | Attending: Cardiovascular Disease

## 2018-01-12 DIAGNOSIS — J45909 Unspecified asthma, uncomplicated: Secondary | ICD-10-CM | POA: Diagnosis not present

## 2018-01-12 DIAGNOSIS — Z6841 Body Mass Index (BMI) 40.0 and over, adult: Secondary | ICD-10-CM | POA: Insufficient documentation

## 2018-01-12 DIAGNOSIS — I420 Dilated cardiomyopathy: Secondary | ICD-10-CM | POA: Insufficient documentation

## 2018-01-12 DIAGNOSIS — E669 Obesity, unspecified: Secondary | ICD-10-CM | POA: Diagnosis not present

## 2018-01-12 DIAGNOSIS — I071 Rheumatic tricuspid insufficiency: Secondary | ICD-10-CM | POA: Insufficient documentation

## 2018-01-12 DIAGNOSIS — I509 Heart failure, unspecified: Secondary | ICD-10-CM | POA: Insufficient documentation

## 2018-01-12 DIAGNOSIS — I110 Hypertensive heart disease with heart failure: Secondary | ICD-10-CM | POA: Insufficient documentation

## 2018-01-13 NOTE — Progress Notes (Signed)
Pt has been made aware of normal result and verbalized understanding.  jw 01/13/18

## 2018-05-14 ENCOUNTER — Ambulatory Visit: Payer: Managed Care, Other (non HMO) | Admitting: Cardiology

## 2018-06-20 ENCOUNTER — Other Ambulatory Visit: Payer: Self-pay | Admitting: Internal Medicine

## 2018-07-14 ENCOUNTER — Emergency Department (HOSPITAL_BASED_OUTPATIENT_CLINIC_OR_DEPARTMENT_OTHER): Payer: Managed Care, Other (non HMO)

## 2018-07-14 ENCOUNTER — Encounter (HOSPITAL_BASED_OUTPATIENT_CLINIC_OR_DEPARTMENT_OTHER): Payer: Self-pay | Admitting: *Deleted

## 2018-07-14 ENCOUNTER — Emergency Department (HOSPITAL_BASED_OUTPATIENT_CLINIC_OR_DEPARTMENT_OTHER)
Admission: EM | Admit: 2018-07-14 | Discharge: 2018-07-15 | Disposition: A | Discharge status: Home / Self Care | Payer: Managed Care, Other (non HMO) | Attending: Emergency Medicine | Admitting: Emergency Medicine

## 2018-07-14 ENCOUNTER — Other Ambulatory Visit: Payer: Self-pay

## 2018-07-14 DIAGNOSIS — I110 Hypertensive heart disease with heart failure: Secondary | ICD-10-CM | POA: Insufficient documentation

## 2018-07-14 DIAGNOSIS — M5442 Lumbago with sciatica, left side: Secondary | ICD-10-CM | POA: Insufficient documentation

## 2018-07-14 DIAGNOSIS — I5022 Chronic systolic (congestive) heart failure: Secondary | ICD-10-CM | POA: Insufficient documentation

## 2018-07-14 DIAGNOSIS — Z79899 Other long term (current) drug therapy: Secondary | ICD-10-CM | POA: Diagnosis not present

## 2018-07-14 DIAGNOSIS — J4530 Mild persistent asthma, uncomplicated: Secondary | ICD-10-CM | POA: Diagnosis not present

## 2018-07-14 DIAGNOSIS — N83201 Unspecified ovarian cyst, right side: Secondary | ICD-10-CM | POA: Diagnosis not present

## 2018-07-14 DIAGNOSIS — Z87891 Personal history of nicotine dependence: Secondary | ICD-10-CM | POA: Insufficient documentation

## 2018-07-14 DIAGNOSIS — R109 Unspecified abdominal pain: Secondary | ICD-10-CM | POA: Diagnosis present

## 2018-07-14 LAB — LIPASE, BLOOD: LIPASE: 36 U/L (ref 11–51)

## 2018-07-14 LAB — COMPREHENSIVE METABOLIC PANEL
ALT: 22 U/L (ref 0–44)
AST: 16 U/L (ref 15–41)
Albumin: 3.8 g/dL (ref 3.5–5.0)
Alkaline Phosphatase: 60 U/L (ref 38–126)
Anion gap: 8 (ref 5–15)
BUN: 11 mg/dL (ref 6–20)
CHLORIDE: 103 mmol/L (ref 98–111)
CO2: 24 mmol/L (ref 22–32)
Calcium: 9.2 mg/dL (ref 8.9–10.3)
Creatinine, Ser: 0.63 mg/dL (ref 0.44–1.00)
GFR calc non Af Amer: >60 mL/min (ref >60)
Glucose, Bld: 91 mg/dL (ref 70–99)
Potassium: 3.6 mmol/L (ref 3.5–5.1)
SODIUM: 135 mmol/L (ref 135–145)
Total Bilirubin: 0.3 mg/dL (ref 0.3–1.2)
Total Protein: 7.3 g/dL (ref 6.5–8.1)

## 2018-07-14 LAB — WET PREP, GENITAL
Clue Cells Wet Prep HPF POC: NONE SEEN
Sperm: NONE SEEN
Trich, Wet Prep: NONE SEEN
Yeast Wet Prep HPF POC: NONE SEEN

## 2018-07-14 LAB — CBC
HEMATOCRIT: 44.1 % (ref 36.0–46.0)
HEMOGLOBIN: 14.4 g/dL (ref 12.0–15.0)
MCH: 30.1 pg (ref 26.0–34.0)
MCHC: 32.7 g/dL (ref 30.0–36.0)
MCV: 92.3 fL (ref 80.0–100.0)
NRBC: 0 % (ref 0.0–0.2)
Platelets: 255 10*3/uL (ref 150–400)
RBC: 4.78 MIL/uL (ref 3.87–5.11)
RDW: 12.4 % (ref 11.5–15.5)
WBC: 11.7 10*3/uL — ABNORMAL HIGH (ref 4.0–10.5)

## 2018-07-14 LAB — URINALYSIS, ROUTINE W REFLEX MICROSCOPIC
Bilirubin Urine: NEGATIVE
GLUCOSE, UA: NEGATIVE mg/dL
HGB URINE DIPSTICK: NEGATIVE
KETONES UR: NEGATIVE mg/dL
LEUKOCYTES UA: NEGATIVE
Nitrite: NEGATIVE
PH: 5.5 (ref 5.0–8.0)
PROTEIN: NEGATIVE mg/dL
Specific Gravity, Urine: 1.025 (ref 1.005–1.030)

## 2018-07-14 MED ORDER — ONDANSETRON HCL 4 MG/2ML IJ SOLN
4.0000 mg | Freq: Once | INTRAMUSCULAR | Status: AC | PRN
  Administered 2018-07-14: 4 mg via INTRAVENOUS
  Filled 2018-07-14: qty 2

## 2018-07-14 MED ORDER — METHOCARBAMOL 500 MG PO TABS: 500.0000 mg | ORAL_TABLET | Freq: Two times a day (BID) | ORAL | 0 refills | Status: DC

## 2018-07-14 MED ORDER — ONDANSETRON 4 MG PO TBDP: 4.0000 mg | ORAL_TABLET | Freq: Three times a day (TID) | ORAL | 0 refills | Status: DC | PRN

## 2018-07-14 MED ORDER — MORPHINE SULFATE (PF) 4 MG/ML IV SOLN
4.0000 mg | Freq: Once | INTRAVENOUS | Status: AC
  Administered 2018-07-14: 4 mg via INTRAVENOUS
  Filled 2018-07-14: qty 1

## 2018-07-14 MED ORDER — LIDOCAINE 5 % EX PTCH: 1.0000 | MEDICATED_PATCH | CUTANEOUS | 0 refills | Status: DC

## 2018-07-14 MED ORDER — PREDNISONE 10 MG (21) PO TBPK: ORAL_TABLET | ORAL | 0 refills | Status: DC

## 2018-07-14 MED ORDER — IOPAMIDOL (ISOVUE-300) INJECTION 61%
100.0000 mL | Freq: Once | INTRAVENOUS | Status: AC | PRN
  Administered 2018-07-14: 100 mL via INTRAVENOUS

## 2018-07-14 NOTE — Discharge Instructions (Addendum)
Antiinflammatory medications: Take 600 mg of ibuprofen every 6 hours or 440 mg (over the counter dose) to 500 mg (prescription dose) of naproxen every 12 hours for the next 3 days. After this time, these medications may be used as needed for pain. Take these medications with food to avoid upset stomach. Choose only one of these medications, do not take them together. Acetaminophen (generic for Tylenol): Should you continue to have additional pain while taking the ibuprofen or naproxen, you may add in acetaminophen as needed. Your daily total maximum amount of acetaminophen from all sources should be limited to 4000mg /day for persons without liver problems, or 2000mg /day for those with liver problems.  Zofran: May use Zofran, as needed, for nausea/vomiting.  Follow-up with your primary care provider or OBGYN for any further management of this issue.  Back pain  Muscle relaxer: Robaxin is a muscle relaxer and may help loosen stiff muscles. Do not take the Robaxin while driving or performing other dangerous activities.  Lidocaine patches: These are available via either prescription or over-the-counter. The over-the-counter option may be more economical one and are likely just as effective. There are multiple over-the-counter brands, such as Salonpas. Exercises: Be sure to perform the attached exercises starting with three times a week and working up to performing them daily. This is an essential part of preventing long term problems.  Follow up: Follow up with a primary care provider for any future management of these complaints. Be sure to follow up within 7-10 days. Return: Return to the ED should symptoms worsen.  For prescription assistance, may try using prescription discount sites or apps, such as goodrx.com

## 2018-07-14 NOTE — ED Triage Notes (Signed)
Back pain x 7 days. Lower right abdominal pain today.

## 2018-07-14 NOTE — ED Provider Notes (Signed)
[facility] EMERGENCY DEPARTMENT Provider Note   CSN: [id number] Arrival date & time: 07/14/18  1539     History   Chief Complaint Chief Complaint  Patient presents with  . Abdominal Pain  . Back Pain    HPI Michelle Kane is a 32 y.o. female.  HPI   Michelle Kane is a 32 y.o. female, with a history of HTN, GERD, hysterectomy, and CHF in pregnancy, presenting to the ED with abdominal pain beginning this morning around 10AM while sitting at her desk. Pain is RLQ, dull aching, but sometimes sharp, 6/10, nonradiating. Accompanied by nausea and diarrhea.  Also complains of left lower back pain for the past 2 weeks, aching, 6/10, radiating down the left leg. Denies known injury/trauma.  Denies fever/chills, vomiting, hematochezia/melena, urinary symptoms, abnormal vaginal discharge/bleeding, or any other complaints.     Past Medical History:  Diagnosis Date  . Allergy-induced asthma    Allergy induced asthma  . Anxiety   . 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 Chronic systolic CHF (congestive heart failure) ([place])   . Essential hypertension    Taken for EF function and tachycardia  . GERD (gastroesophageal reflux disease)   . Headache    migraines  . 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 History of colon polyps   . History of ovarian cyst   . Pelvic hematoma, female   . Pelvic pain in female   . Peripartum cardiomyopathy    a. EF 40-45% in 2016, varying by several echoes. F/u echo 06/2015 showed EF 50%.   Marland Kitchen PONV (postoperative nausea and vomiting)   . Sinus tachycardia    a. 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 b. 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    Patient Active Problem List   Diagnosis Date Noted  . History of chest pain 12/31/2017  . Family history of early CAD 12/31/2017  . Migraines 09/15/2017  . Mild persistent asthma without complication 76/28/3151  . Anxiety  state 12/19/2015  . Essential hypertension 10/09/2015  . Nonischemic congestive cardiomyopathy ([place]) 10/09/2015  . Inappropriate sinus tachycardia 04/13/2015  . GERD (gastroesophageal reflux disease)     Past Surgical History:  Procedure Laterality Date  . ABDOMINAL HYSTERECTOMY    . CESAREAN SECTION  03-01-2008  and 2016  . IM PINNING RIGHT ELBOW FX  1992   HARDWARE REMOVED  . LAPAROSCOPIC ASSISTED VAGINAL HYSTERECTOMY Left 02/04/2017   Procedure: LAPAROSCOPIC ASSISTED VAGINAL HYSTERECTOMY, LEFT SALPINGOOPHORECTOMY;  Surgeon: Dian Queen, MD;  Location: [facility];  Service: Gynecology;  Laterality: Left;  NEED BED  . LAPAROSCOPY N/A 07/29/2014   Procedure: LAPAROSCOPY DIAGNOSTIC;  Surgeon: Cyril Mourning, MD;  Location: [facility];  Service: Gynecology;  Laterality: N/A;  . OOPHORECTOMY Left 02/04/2017   Procedure: LEFT OOPHORECTOMY;  Surgeon: Dian Queen, MD;  Location: [facility];  Service: Gynecology;  Laterality: Left;     OB History    Gravida  4   Para  2   Term  2   Preterm      AB  1   Living  2     SAB  1   TAB      Ectopic      Multiple      Live Births               Home Medications    Prior  to Admission medications   Medication Sig Start Date End Date Taking? Authorizing Provider  albuterol (PROVENTIL HFA;VENTOLIN HFA) 108 (90 Base) MCG/ACT inhaler Inhale 2 puffs into the lungs every 6 (six) hours as needed for wheezing or shortness of breath. 04/10/17   [name], MD  budesonide-formoterol [facility] 80-4.5 MCG/ACT inhaler INHALE 2 PUFFS BY MOUTH AT BEDTIME 06/22/18   Jearld Fenton, NP  famotidine (PEPCID) 20 MG tablet Take 1 tablet (20 mg total) by mouth at bedtime. 09/15/17   Jearld Fenton, NP  furosemide (LASIX) 40 MG tablet Take 1 tablet (40 mg total) by mouth 2 (two) times daily. 12/31/17   Imogene Burn, PA-C  lidocaine (LIDODERM) 5 % Place 1 patch onto the skin daily.  Remove & Discard patch within 12 hours or as directed by MD 07/14/18   Joy, Shawn C, PA-C  methocarbamol (ROBAXIN) 500 MG tablet Take 1 tablet (500 mg total) by mouth 2 (two) times daily. 07/14/18   Joy, Shawn C, PA-C  metoprolol tartrate (LOPRESSOR) 50 MG tablet Take 1 tablet (50 mg total) by mouth 2 (two) times daily. 12/31/17 03/31/18  Imogene Burn, PA-C  ondansetron (ZOFRAN ODT) 4 MG disintegrating tablet Take 1 tablet (4 mg total) by mouth every 8 (eight) hours as needed for nausea or vomiting. 07/14/18   Joy, Shawn C, PA-C  pantoprazole (PROTONIX) 40 MG tablet Take 1 tablet (40 mg total) by mouth daily. Take 30-60 min before first meal of the day 09/15/17   Jearld Fenton, NP  potassium chloride SA (K-DUR,KLOR-CON) 20 MEQ tablet Take 1 tablet (20 mEq total) by mouth 2 (two) times daily. 12/31/17 03/31/18  Imogene Burn, PA-C  predniSONE (STERAPRED UNI-PAK 21 TAB) 10 MG (21) TBPK tablet Take 6 tabs (60mg ) day 1, 5 tabs (50mg ) day 2, 4 tabs (40mg ) day 3, 3 tabs (30mg ) day 4, 2 tabs (20mg ) day 5, and 1 tab (10mg ) day 6. 07/14/18   Joy, Helane Gunther, PA-C    Family History Family History  Problem Relation Age of Onset  . Asthma Mother   . Crohn's disease Mother   . Colon polyps Mother   . Hyperlipidemia Father   . Hemophilia Father   . COPD Father        smoked  . Hemophilia Son   . Heart attack Maternal Grandfather   . Diabetes Paternal Grandfather   . Stroke Paternal Grandfather   . Heart failure Brother   . Heart failure Paternal Uncle   . Heart failure Paternal Aunt   . COPD Maternal Grandmother        smoked  . Lung cancer Maternal Grandmother        smoked  . Hypertension Neg Hx     Social History Social History   Tobacco Use  . Smoking status: Former Smoker    Packs/day: 0.50    Years: 10.00    Pack years: 5.00    Types: Cigarettes    Last attempt to quit: 02/13/2015    Years since quitting: 3.4  . Smokeless tobacco: Never Used  Substance Use Topics  . Alcohol use:  Yes    Alcohol/week: 0.0 standard drinks    Comment: social  . Drug use: No     Allergies   Vicodin [hydrocodone-acetaminophen]   Review of Systems Review of Systems  Constitutional: Negative for chills, diaphoresis and fever.  Respiratory: Negative for shortness of breath.   Cardiovascular: Negative for chest pain.  Gastrointestinal: Positive for abdominal  pain, diarrhea and nausea. Negative for blood in stool and vomiting.  Genitourinary: Negative for dysuria, flank pain, hematuria, vaginal bleeding and vaginal discharge.  Musculoskeletal: Positive for back pain.  Neurological: Negative for weakness and numbness.  All other systems reviewed and are negative.    Physical Exam Updated Vital Signs BP 120/84   Pulse 92   Temp 98.2 F (36.8 C) (Oral)   Resp 20   Ht 5' 6.75" (1.695 m)   Wt 117.9 kg   LMP 12/09/2016   SpO2 96%   BMI 41.03 kg/m   Physical Exam Vitals signs and nursing note reviewed.  Constitutional:      General: She is not in acute distress.    Appearance: She is well-developed. She is not diaphoretic.  HENT:     Head: Normocephalic and atraumatic.  Eyes:     Conjunctiva/sclera: Conjunctivae normal.  Neck:     Musculoskeletal: Neck supple.  Cardiovascular:     Rate and Rhythm: Normal rate and regular rhythm.     Pulses:          Posterior tibial pulses are 2+ on the right side and 2+ on the left side.     Heart sounds: Normal heart sounds.  Pulmonary:     Effort: Pulmonary effort is normal. No respiratory distress.     Breath sounds: Normal breath sounds.  Abdominal:     Palpations: Abdomen is soft.     Tenderness: There is abdominal tenderness. There is no guarding.    Genitourinary:    Comments: External genitalia normal Vagina without discharge Cervix  normal negative for cervical motion tenderness Adnexa palpated, no masses, positive for tenderness noted on the right Bladder palpated negative for tenderness Uterus palpated no  masses, negative for tenderness  No inguinal lymphadenopathy. Otherwise normal female genitalia. RN, Vivien Rota, served as chaperone during exam. Musculoskeletal:       Back:     Comments: Normal motor function intact in all extremities. No midline spinal tenderness.   Lymphadenopathy:     Cervical: No cervical adenopathy.  Skin:    General: Skin is warm and dry.  Neurological:     Mental Status: She is alert.     Comments: Sensation grossly intact to light touch in the lower extremities bilaterally. No saddle anesthesias. Strength 5/5 in the bilateral lower extremities. No noted gait deficit. Coordination intact with heel to shin testing.  Psychiatric:        Behavior: Behavior normal.      ED Treatments / Results  Labs (all labs ordered are listed, but only abnormal results are displayed) Labs Reviewed  WET PREP, GENITAL - Abnormal; Notable for the following components:      Result Value   WBC, Wet Prep HPF POC MANY (*)    All other components within normal limits  CBC - Abnormal; Notable for the following components:   WBC 11.7 (*)    All other components within normal limits  URINALYSIS, ROUTINE W REFLEX MICROSCOPIC  LIPASE, BLOOD  COMPREHENSIVE METABOLIC PANEL  GC/CHLAMYDIA PROBE AMP ([place]) NOT AT [facility]    EKG None  Radiology US Transvaginal Non-ob  Result Date: 07/14/2018 CLINICAL DATA:  32 year old female with a history of hysterectomy and left salpingo-oophorectomy 02/04/2017, now presents with right adnexal pain for 1 day. EXAM: TRANSABDOMINAL AND TRANSVAGINAL ULTRASOUND OF PELVIS DOPPLER ULTRASOUND OF OVARIES TECHNIQUE: Both transabdominal and transvaginal ultrasound examinations of the pelvis were performed. Transabdominal technique was performed for global imaging of the pelvis including  vaginal cuff, adnexal regions, and pelvic cul-de-sac. It was necessary to proceed with endovaginal exam following the transabdominal exam to visualize the vaginal cuff and  right adnexa. Color and duplex Doppler ultrasound was utilized to evaluate blood flow to the ovaries. COMPARISON:  07/14/2018 CT abdomen/pelvis. FINDINGS: Uterus Status posthysterectomy. No mass or fluid collection at the vaginal cuff. Right ovary Measurements: 3.7 x 2.7 x 2.4 cm = volume: 12.5 mL. Small simple right ovarian follicular cysts, largest 1.3 cm. No abnormal right ovarian or right adnexal masses. Left ovary Status post left oophorectomy.  No left adnexal mass. Pulsed Doppler evaluation of the right ovary demonstrates normal low-resistance arterial and venous waveforms. Other findings No abnormal free fluid. IMPRESSION: 1. Normal right ovary with no evidence of right adnexal torsion. Left oophorectomy. 2. No adnexal masses. 3. Status post hysterectomy with no abnormal findings at the vaginal cuff. Electronically Signed   By: Ilona Sorrel M.D.   On: 07/14/2018 23:28   US Pelvis Complete  Result Date: 07/14/2018 CLINICAL DATA:  32 year old female with a history of hysterectomy and left salpingo-oophorectomy 02/04/2017, now presents with right adnexal pain for 1 day. EXAM: TRANSABDOMINAL AND TRANSVAGINAL ULTRASOUND OF PELVIS DOPPLER ULTRASOUND OF OVARIES TECHNIQUE: Both transabdominal and transvaginal ultrasound examinations of the pelvis were performed. Transabdominal technique was performed for global imaging of the pelvis including vaginal cuff, adnexal regions, and pelvic cul-de-sac. It was necessary to proceed with endovaginal exam following the transabdominal exam to visualize the vaginal cuff and right adnexa. Color and duplex Doppler ultrasound was utilized to evaluate blood flow to the ovaries. COMPARISON:  07/14/2018 CT abdomen/pelvis. FINDINGS: Uterus Status posthysterectomy. No mass or fluid collection at the vaginal cuff. Right ovary Measurements: 3.7 x 2.7 x 2.4 cm = volume: 12.5 mL. Small simple right ovarian follicular cysts, largest 1.3 cm. No abnormal right ovarian or right adnexal  masses. Left ovary Status post left oophorectomy.  No left adnexal mass. Pulsed Doppler evaluation of the right ovary demonstrates normal low-resistance arterial and venous waveforms. Other findings No abnormal free fluid. IMPRESSION: 1. Normal right ovary with no evidence of right adnexal torsion. Left oophorectomy. 2. No adnexal masses. 3. Status post hysterectomy with no abnormal findings at the vaginal cuff. Electronically Signed   By: Ilona Sorrel M.D.   On: 07/14/2018 23:28   Ct Abdomen Pelvis W Contrast  Result Date: 07/14/2018 CLINICAL DATA:  32 year old female with history of back pain for the past 7 days. Right lower quadrant abdominal pain today. EXAM: CT ABDOMEN AND PELVIS WITH CONTRAST TECHNIQUE: Multidetector CT imaging of the abdomen and pelvis was performed using the standard protocol following bolus administration of intravenous contrast. CONTRAST:  159mL ISOVUE-300 IOPAMIDOL (ISOVUE-300) INJECTION 61% COMPARISON:  CT the abdomen and pelvis 07/19/2017. FINDINGS: Lower chest: Unremarkable. Hepatobiliary: No suspicious cystic or solid hepatic lesions. No intra or extrahepatic biliary ductal dilatation. Gallbladder is normal in appearance. Pancreas: No pancreatic mass. No pancreatic ductal dilatation. No pancreatic or peripancreatic fluid or inflammatory changes. Spleen: Unremarkable. Adrenals/Urinary Tract: Bilateral kidneys and bilateral adrenal glands are normal in appearance. No hydroureteronephrosis. Urinary bladder is normal in appearance. Stomach/Bowel: Normal appearance of the stomach. No pathologic dilatation of small bowel or colon. Normal appendix. Vascular/Lymphatic: No significant atherosclerotic disease, aneurysm or dissection noted in the abdominal or pelvic vasculature. No lymphadenopathy noted in the abdomen or pelvis. Reproductive: Status post hysterectomy. Left ovary is not confidently identified may be surgically absent or atrophic. 1.8 cm low-intermediate attenuation lesion in  the right ovary, compatible  with a dominant follicle. Other: No significant volume of ascites.  No pneumoperitoneum. Musculoskeletal: There are no aggressive appearing lytic or blastic lesions noted in the visualized portions of the skeleton. IMPRESSION: 1. No acute findings are noted in the abdomen or pelvis to account for the patient's symptoms. 2. Normal appendix. Electronically Signed   By: Vinnie Langton M.D.   On: 07/14/2018 19:07   [place] Art/ven Flow Abd Pelv Doppler  Result Date: 07/14/2018 CLINICAL DATA:  32 year old female with a history of hysterectomy and left salpingo-oophorectomy 02/04/2017, now presents with right adnexal pain for 1 day. EXAM: TRANSABDOMINAL AND TRANSVAGINAL ULTRASOUND OF PELVIS DOPPLER ULTRASOUND OF OVARIES TECHNIQUE: Both transabdominal and transvaginal ultrasound examinations of the pelvis were performed. Transabdominal technique was performed for global imaging of the pelvis including vaginal cuff, adnexal regions, and pelvic cul-de-sac. It was necessary to proceed with endovaginal exam following the transabdominal exam to visualize the vaginal cuff and right adnexa. Color and duplex Doppler ultrasound was utilized to evaluate blood flow to the ovaries. COMPARISON:  07/14/2018 CT abdomen/pelvis. FINDINGS: Uterus Status posthysterectomy. No mass or fluid collection at the vaginal cuff. Right ovary Measurements: 3.7 x 2.7 x 2.4 cm = volume: 12.5 mL. Small simple right ovarian follicular cysts, largest 1.3 cm. No abnormal right ovarian or right adnexal masses. Left ovary Status post left oophorectomy.  No left adnexal mass. Pulsed Doppler evaluation of the right ovary demonstrates normal low-resistance arterial and venous waveforms. Other findings No abnormal free fluid. IMPRESSION: 1. Normal right ovary with no evidence of right adnexal torsion. Left oophorectomy. 2. No adnexal masses. 3. Status post hysterectomy with no abnormal findings at the vaginal cuff. Electronically  Signed   By: Ilona Sorrel M.D.   On: 07/14/2018 23:28    Procedures Procedures (including critical care time)  Medications Ordered in ED Medications  ondansetron (ZOFRAN) injection 4 mg (4 mg Intravenous Given 07/14/18 1711)  morphine 4 MG/ML injection 4 mg (4 mg Intravenous Given 07/14/18 1842)  iopamidol (ISOVUE-300) 61 % injection 100 mL (100 mLs Intravenous Contrast Given 07/14/18 1847)  morphine 4 MG/ML injection 4 mg (4 mg Intravenous Given 07/14/18 2217)     Initial Impression / Assessment and Plan / ED Course  I have reviewed the triage vital signs and the nursing notes.  Pertinent labs & imaging results that were available during my care of the patient were reviewed by me and considered in my medical decision making (see chart for details).     Patient presents with right lower quadrant abdominal pain. Patient is nontoxic appearing, afebrile, not tachycardic, not tachypneic, not hypotensive, maintains excellent SPO2 on room air, and is in no apparent distress.  Mild leukocytosis of 11.7. Lab work otherwise quite reassuring.  With the patient's area of pain and tenderness as well as element of GI symptoms, I prioritize ruling out appendicitis first with a CT scan.  CT without acute abnormality. Pelvic ultrasound noted right lower leg ovarian cysts.  When I spoke to the patient about the presence of the cyst, she states, "That would make a lot of sense why I am having pain in that region.  I tend to feel any cyst that I develop and I usually have pain like I am having now. I just forgot about it because I haven't had one in a while."  Patient's left-sided back pain is consistent with sciatica.  Neurovascularly intact.  The patient was given instructions for home care as well as return precautions. Patient voices understanding of  these instructions, accepts the plan, and is comfortable with discharge.     Vitals:   07/14/18 1800 07/14/18 1900 07/14/18 2000 07/14/18 2217  BP:  124/89 115/73 123/81 122/84  Pulse: 88 88 83 78  Resp:   20 16  Temp:    98.3 F (36.8 C)  TempSrc:    Oral  SpO2: 95% 97% 96% 100%  Weight:      Height:          Final Clinical Impressions(s) / ED Diagnoses   Final diagnoses:  Cyst of right ovary  Acute left-sided low back pain with left-sided sciatica    ED Discharge Orders         Ordered    ondansetron (ZOFRAN ODT) 4 MG disintegrating tablet  Every 8 hours PRN     07/14/18 2343    methocarbamol (ROBAXIN) 500 MG tablet  2 times daily     07/14/18 2347    lidocaine (LIDODERM) 5 %  Every 24 hours     07/14/18 2347    predniSONE (STERAPRED UNI-PAK 21 TAB) 10 MG (21) TBPK tablet     07/14/18 2347           Lorayne Bender, PA-C 07/14/18 2354    Blanchie Dessert, MD 07/16/18 2201

## 2018-07-14 NOTE — ED Notes (Signed)
Patient transported to Ultrasound 

## 2018-07-17 LAB — GC/CHLAMYDIA PROBE AMP (~~LOC~~) NOT AT ARMC
Chlamydia: NEGATIVE
Neisseria Gonorrhea: NEGATIVE

## 2018-08-13 ENCOUNTER — Ambulatory Visit (INDEPENDENT_AMBULATORY_CARE_PROVIDER_SITE_OTHER): Payer: Managed Care, Other (non HMO) | Admitting: Cardiology

## 2018-08-13 ENCOUNTER — Encounter: Payer: Self-pay | Admitting: Cardiology

## 2018-08-13 VITALS — BP 126/84 | HR 93 | Ht 67.0 in | Wt 281.0 lb

## 2018-08-13 DIAGNOSIS — I420 Dilated cardiomyopathy: Secondary | ICD-10-CM | POA: Diagnosis not present

## 2018-08-13 DIAGNOSIS — I10 Essential (primary) hypertension: Secondary | ICD-10-CM

## 2018-08-13 DIAGNOSIS — Z8249 Family history of ischemic heart disease and other diseases of the circulatory system: Secondary | ICD-10-CM

## 2018-08-13 MED ORDER — METOPROLOL TARTRATE 50 MG PO TABS: 75.0000 mg | ORAL_TABLET | Freq: Two times a day (BID) | ORAL | 1 refills | Status: DC

## 2018-08-13 NOTE — Patient Instructions (Signed)
Medication Instructions:   INCREASE YOUR METOPROLOL TARTRATE TO 75 MG BY MOUTH TWICE DAILY  If you need a refill on your cardiac medications before your next appointment, please call your pharmacy.      Lab work:  PRIOR TO YOUR 6 MONTH FOLLOW-UP APPOINTMENT WITH DR NELSON TO CHECK--CMET, TSH, AND PRO-BNP--PLEASE SCHEDULE YOUR LAB APPOINTMENT TODAY  If you have labs (blood work) drawn today and your tests are completely normal, you will receive your results only by: Marland Kitchen MyChart Message (if you have MyChart) OR . A paper copy in the mail If you have any lab test that is abnormal or we need to change your treatment, we will call you to review the results.     Follow-Up:  Your physician wants you to follow-up in: [place] will receive a reminder letter in the mail two months in advance. If you don't receive a letter, please call our office to schedule the follow-up appointment.  PLEASE SCHEDULE YOUR LAB PRIOR TO THIS OFFICE VISIT.  CHECKOUT PLEASE SCHEDULE THE PATIENTS LAB APPOINTMENT TODAY--PRIOR TO HER 6 MONTH FOLLOW-UP APPOINTMENT WITH DR Meda Coffee

## 2018-08-13 NOTE — Progress Notes (Signed)
Cardiology Office Note    Date:  08/13/2018   ID:  Michelle, Kane 04/07/1986, MRN [id number]  PCP:  Michelle Fenton, NP  Cardiologist: Michelle Dawley, MD  Reason for visit: Months follow-up  History of Present Illness:  Michelle Kane is a 33 y.o. female who works at [employer] in [place].  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  She also has had inappropriate sinus tachycardia treated with beta-blocker, GERD, allergy induced asthma.  In 11/11/2016 complaining of dyspnea on exertion.  Patient was euvolemic and took Lasix only as needed.  Long history of chest pain CTA 08/2016 calcium score was 0 no evidence of CAD.  Patient was in the ED 12/09/2017 with shortness of breath 14 pound weight gain but signed herself out.  Patient comes in today for follow-up.  She said she has had worsening trouble with swelling since the weather has gotten hot and she has resumed Lasix 40 mg twice daily.  This is controlling it pretty well.  She has not taken potassium with it.  Heart rate is 114 bpm when she walked in here today.  Heart rate came down to 91 time we did the EKG.  She says she is been under a lot of stress and anxiety.  She says is usually 80 she checks it every day.  She stopped her metoprolol over a year ago.  She says she discussed this with Dr. Meda Coffee although notes from 10/2016 indicates she should continue metoprolol.  Notes also indicate she was on losartan at that time the patient said it was stopped because of low blood pressures.  She exercises on the elliptical 15 to 20 minutes 2 to 3 days a week.  Heart rate gets up to 160 when she is exercising.  08/13/2018 - 6 months follow up, she has been very stressed as her youngest child been diagnosed with autism, suffers from insomnia, and she currently is working 2 jobs.  She also helps taking care of her  parents who both have medical problems.  She has not been able to take care of herself at all.  She has been compliant with her medications and is tolerating them well.  She denies any orthopnea proximal nocturnal dyspnea.  She uses Lasix only as needed and has no lower extremity edema only occasionally.  Denies any palpitations syncope or falls.   Past Medical History:  Diagnosis Date  . Allergy-induced asthma    Allergy induced asthma  . Anxiety   . 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 Chronic systolic CHF (congestive heart failure) ([place])   . Essential hypertension    Taken for EF function and tachycardia  . GERD (gastroesophageal reflux disease)   . Headache    migraines  . 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 History of colon polyps   . History of ovarian cyst   . Pelvic hematoma, female   . Pelvic pain in female   . Peripartum cardiomyopathy    a. EF 40-45% in 2016, varying by several echoes. F/u echo 06/2015 showed EF 50%.   Marland Kitchen PONV (postoperative nausea and vomiting)   . Sinus tachycardia    a. 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 b. 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    Past Surgical  History:  Procedure Laterality Date  . ABDOMINAL HYSTERECTOMY    . CESAREAN SECTION  03-01-2008  and 2016  . IM PINNING RIGHT ELBOW FX  1992   HARDWARE REMOVED  . LAPAROSCOPIC ASSISTED VAGINAL HYSTERECTOMY Left 02/04/2017   Procedure: LAPAROSCOPIC ASSISTED VAGINAL HYSTERECTOMY, LEFT SALPINGOOPHORECTOMY;  Surgeon: Michelle Queen, MD;  Location: [facility];  Service: Gynecology;  Laterality: Left;  NEED BED  . LAPAROSCOPY N/A 07/29/2014   Procedure: LAPAROSCOPY DIAGNOSTIC;  Surgeon: Michelle Mourning, MD;  Location: [facility];  Service: Gynecology;  Laterality: N/A;  . OOPHORECTOMY Left 02/04/2017   Procedure: LEFT OOPHORECTOMY;  Surgeon: Michelle Queen, MD;  Location: [facility];  Service: Gynecology;  Laterality: Left;    Current Medications: No outpatient medications have been marked as taking for the 08/13/18 encounter (Office Visit) with Michelle Spark, MD.     Allergies:   Vicodin [hydrocodone-acetaminophen]   Social History   Socioeconomic History  . Marital status: Married    Spouse name: Not on file  . Number of children: Not on file  . Years of education: Not on file  . Highest education level: Not on file  Occupational History  . Occupation: CNA   Social Needs  . Financial resource strain: Not on file  . Food insecurity:    Worry: Not on file    Inability: Not on file  . Transportation needs:    Medical: Not on file    Non-medical: Not on file  Tobacco Use  . Smoking status: Former Smoker    Packs/day: 0.50    Years: 10.00    Pack years: 5.00    Types: Cigarettes    Last attempt to quit: 02/13/2015    Years since quitting: 3.4  . Smokeless tobacco: Never Used  Substance and Sexual Activity  . Alcohol use: Yes    Alcohol/week: 0.0 standard drinks    Comment: social  . Drug use: No  . Sexual activity: Yes    Birth control/protection: Surgical  Lifestyle  . Physical activity:    Days per week: Not on file    Minutes per session: Not on file  . Stress: Not on file  Relationships  . Social connections:    Talks on phone: Not on file    Gets together: Not on file    Attends religious service: Not on file    Active member of club or organization: Not on file    Attends meetings of clubs or organizations: Not on file    Relationship status: Not on file  Other Topics Concern  . Not on file  Social History Narrative  . Not on file     Family History:  The patient's family history includes Asthma in her mother; COPD in her father and maternal grandmother; Colon polyps in her mother; Crohn's disease in her mother; Diabetes in her paternal grandfather; Heart attack in her maternal grandfather; Heart failure in her  brother, paternal aunt, and paternal uncle; Hemophilia in her father and son; Hyperlipidemia in her father; Lung cancer in her maternal grandmother; Stroke in her paternal grandfather.   ROS:   Please see the history of present illness.    Review of Systems  Constitution: Negative.  HENT: Negative.   Eyes: Negative.   Cardiovascular: Negative.   Respiratory: Negative.   Hematologic/Lymphatic: Negative.   Musculoskeletal: Negative.  Negative for joint pain.  Gastrointestinal: Negative.   Genitourinary: Negative.   Neurological: Negative.   Psychiatric/Behavioral: Positive  for depression. The patient is nervous/anxious.    All other systems reviewed and are negative.   PHYSICAL EXAM:   VS:  BP 126/84   Pulse 93   Ht 5\' 7"  (1.702 m)   Wt 281 lb (127.5 kg)   LMP 12/09/2016   SpO2 99%   BMI 44.01 kg/m   Physical Exam  GEN: Obesity, in no acute distress  Neck: no JVD, carotid bruits, or masses Cardiac:RRR; no murmurs, rubs, or gallops  Respiratory:  clear to auscultation bilaterally, normal work of breathing GI: soft, nontender, nondistended, + BS Ext: without cyanosis, clubbing, or edema, Good distal pulses bilaterally MS: no deformity or atrophy  Skin: warm and dry, no rash Neuro:  Alert and Oriented x 3, Strength and sensation are intact Psych: euthymic mood, full affect  Wt Readings from Last 3 Encounters:  08/13/18 281 lb (127.5 kg)  07/14/18 260 lb (117.9 kg)  12/31/17 264 lb (119.7 kg)      Studies/Labs Reviewed:   EKG:  EKG is  ordered today.  The ekg ordered today demonstrates normal sinus rhythm at 91 bpm, no acute change  Recent Labs: 12/31/2017: TSH 1.010 07/14/2018: ALT 22; BUN 11; Creatinine, Ser 0.63; Hemoglobin 14.4; Platelets 255; Potassium 3.6; Sodium 135   Lipid Panel    Component Value Date/Time   CHOL 190 12/19/2015 0946   TRIG 194.0 (H) 12/19/2015 0946   HDL 36.20 (L) 12/19/2015 0946   CHOLHDL 5 12/19/2015 0946   VLDL 38.8 12/19/2015 0946     LDLCALC 115 (H) 12/19/2015 0946    Additional studies/ records that were reviewed today include:  CT 2/2018IMPRESSION: 1. Coronary calcium score of 0. This was 0 percentile for age and sex matched control.   2. Normal coronary origin with right dominance.   3. No evidence of CAD.   2D echo 6/2017Study Conclusions   - Left ventricle: The cavity size was mildly dilated. Systolic   function was normal. The estimated ejection fraction was in the   range of 50% to 55%. Wall motion was normal; there were no   regional wall motion abnormalities. Left ventricular diastolic   function parameters were normal. - Aortic valve: Transvalvular velocity was within the normal range.   There was no stenosis. There was no regurgitation. - Mitral valve: Transvalvular velocity was within the normal range.   There was no evidence for stenosis. There was trivial   regurgitation. - Right ventricle: The cavity size was normal. Wall thickness was   normal. - Tricuspid valve: There was no regurgitation.     ASSESSMENT:    1. Nonischemic congestive cardiomyopathy ([place])   2. Essential hypertension      PLAN:  In order of problems listed above:  Nonischemic cardiomyopathy LVEF normalized on last echo 2017.  Cardiomyopathy felt to be pregnancy-induced and possibly familial.  She is currently euvolemic, will continue Lasix as needed.  History of inappropriate sinus tachycardia We will increase metoprolol to 75 mg p.o. twice daily, she is also encouraged to start daily walking or exercises that will help to manage her at rate response with exercise.  Essential hypertension -controlled  History of chest pain with calcium score of 0 and no CAD on CT scan 08/2016  Family history of early CAD father prior CABG recently had a drop in his ejection fraction  Follow-up in 6 months with labs prior to that appointment.  Medication Adjustments/Labs and Tests Ordered: Current medicines are reviewed at  length with the patient today.  Concerns regarding medicines are outlined above.  Medication changes, Labs and Tests ordered today are listed in the Patient Instructions below. There are no Patient Instructions on file for this visit.   Signed, Michelle Dawley, MD  08/13/2018 10:22 AM    [facility] [place], [place],   [address] Phone: [phone]; Fax: [phone]

## 2018-09-17 ENCOUNTER — Telehealth: Payer: Self-pay

## 2018-09-17 MED ORDER — SYMBICORT 80-4.5 MCG/ACT IN AERO: 2.0000 | INHALATION_SPRAY | Freq: Every day | RESPIRATORY_TRACT | 0 refills | Status: DC

## 2018-09-17 NOTE — Telephone Encounter (Signed)
It looks like the name brand Symbicort is preferred... also other preferred alternatives include the following: ADVAIR HFA,BREO ELLIPTA,DULERA,SYMBICORT  I will send in Rx for name brand to see if the PA is not needed first

## 2018-09-18 NOTE — Telephone Encounter (Signed)
Pt left vm that she has been out of inhaler for one month. Pt understands a PA is needed. Pt request cb today due to pt needing her inhaler.

## 2018-09-18 NOTE — Telephone Encounter (Signed)
Called pt someone picked up then hung up....  Received notification from insurance Rx for inhaler was approved and I spoke to Legrand Como at [facility] to confirm and he stated it did go through  [employer] Date:08/19/2018;Coverage End Date:09/17/2021

## 2018-10-15 ENCOUNTER — Other Ambulatory Visit: Payer: Self-pay | Admitting: Internal Medicine

## 2018-11-24 ENCOUNTER — Other Ambulatory Visit: Payer: Self-pay | Admitting: Internal Medicine

## 2019-01-19 ENCOUNTER — Encounter (HOSPITAL_BASED_OUTPATIENT_CLINIC_OR_DEPARTMENT_OTHER): Payer: Self-pay

## 2019-01-19 ENCOUNTER — Telehealth: Payer: Self-pay | Admitting: *Deleted

## 2019-01-19 ENCOUNTER — Other Ambulatory Visit: Payer: Self-pay

## 2019-01-19 ENCOUNTER — Emergency Department (HOSPITAL_BASED_OUTPATIENT_CLINIC_OR_DEPARTMENT_OTHER)
Admission: EM | Admit: 2019-01-19 | Discharge: 2019-01-19 | Disposition: A | Discharge status: Home / Self Care | Payer: Managed Care, Other (non HMO) | Attending: Emergency Medicine | Admitting: Emergency Medicine

## 2019-01-19 DIAGNOSIS — Z87891 Personal history of nicotine dependence: Secondary | ICD-10-CM | POA: Diagnosis not present

## 2019-01-19 DIAGNOSIS — I110 Hypertensive heart disease with heart failure: Secondary | ICD-10-CM | POA: Diagnosis not present

## 2019-01-19 DIAGNOSIS — Z79899 Other long term (current) drug therapy: Secondary | ICD-10-CM | POA: Diagnosis not present

## 2019-01-19 DIAGNOSIS — I5022 Chronic systolic (congestive) heart failure: Secondary | ICD-10-CM | POA: Diagnosis not present

## 2019-01-19 DIAGNOSIS — N3091 Cystitis, unspecified with hematuria: Secondary | ICD-10-CM | POA: Diagnosis not present

## 2019-01-19 DIAGNOSIS — R319 Hematuria, unspecified: Secondary | ICD-10-CM | POA: Diagnosis present

## 2019-01-19 DIAGNOSIS — N3001 Acute cystitis with hematuria: Secondary | ICD-10-CM

## 2019-01-19 DIAGNOSIS — Z8541 Personal history of malignant neoplasm of cervix uteri: Secondary | ICD-10-CM | POA: Insufficient documentation

## 2019-01-19 LAB — URINALYSIS, ROUTINE W REFLEX MICROSCOPIC
Bilirubin Urine: NEGATIVE
Glucose, UA: NEGATIVE mg/dL
Ketones, ur: NEGATIVE mg/dL
Nitrite: POSITIVE — AB
Protein, ur: 100 mg/dL — AB
Specific Gravity, Urine: >1.03 — ABNORMAL HIGH (ref 1.005–1.030)
pH: 6 (ref 5.0–8.0)

## 2019-01-19 LAB — URINALYSIS, MICROSCOPIC (REFLEX): RBC / HPF: >50 RBC/hpf (ref 0–5)

## 2019-01-19 MED ORDER — CEPHALEXIN 250 MG PO CAPS
500.0000 mg | ORAL_CAPSULE | Freq: Once | ORAL | Status: AC
  Administered 2019-01-19: 500 mg via ORAL
  Filled 2019-01-19: qty 2

## 2019-01-19 MED ORDER — PHENAZOPYRIDINE HCL 200 MG PO TABS: 200.0000 mg | ORAL_TABLET | Freq: Three times a day (TID) | ORAL | 0 refills | Status: DC

## 2019-01-19 MED ORDER — CEPHALEXIN 500 MG PO CAPS: 500.0000 mg | ORAL_CAPSULE | Freq: Three times a day (TID) | ORAL | 0 refills | Status: AC

## 2019-01-19 NOTE — ED Triage Notes (Signed)
Pt c/o blood in urine x today-NAD-steady gait

## 2019-01-19 NOTE — Telephone Encounter (Signed)
Noted and agree that she needs evaluation. 

## 2019-01-19 NOTE — Telephone Encounter (Signed)
Patient called stating that she is having blood in her urine that started this morning. Patient stated that she has gone to the bathroom 4 times in the last hour and a half and has had blood in her urine or in the toilet every time that she has gone to the bathroom. Patient stated that she has had some chills and nausea. Advised patient with her symptoms she needs to be seen and evaluated today. Patient stated that she lives in [place]. Patient stated that her mom is on the way to watch her kids and she will go to the Urgent Care on [facility] [address] in [facility] when her mom gets there.

## 2019-01-19 NOTE — ED Provider Notes (Signed)
[facility] EMERGENCY DEPARTMENT Provider Note   CSN: [id number] Arrival date & time: 01/19/19  1530    History   Chief Complaint Chief Complaint  Patient presents with  . Hematuria    HPI Michelle Kane is a 33 y.o. female.     Michelle Kane is a 33 y.o. female with a history of CHF, hypertension, GERD, migraines, cervical cancer s/p hysterectomy, who presents to the emergency department for evaluation of hematuria.  She reports symptoms started this morning.  She reports initially started as light red and now has become largely blood in her urine.  She denies burning or pain with urination but does report suprapubic pressure that worsens while urinating.  She also reports urinary frequency, reporting she feels like she needs to go every 5 minutes.  She denies fevers or chills, no nausea or vomiting.  No flank pain.  No prior history of kidney stones or pyelonephritis, has had UTIs in the past.  No vaginal bleeding or blood in her stools.  No other aggravating or alleviating factors.  No medications prior to arrival.     Past Medical History:  Diagnosis Date  . Allergy-induced asthma    Allergy induced asthma  . Anxiety   . 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 Chronic systolic CHF (congestive heart failure) ([place])   . Essential hypertension    Taken for EF function and tachycardia  . GERD (gastroesophageal reflux disease)   . Headache    migraines  . 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 History of colon polyps   . History of ovarian cyst   . Pelvic hematoma, female   . Pelvic pain in female   . Peripartum cardiomyopathy    a. EF 40-45% in 2016, varying by several echoes. F/u echo 06/2015 showed EF 50%.   Marland Kitchen PONV (postoperative nausea and vomiting)   . Sinus tachycardia    a. 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 b. 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    Patient Active Problem List    Diagnosis Date Noted  . History of chest pain 12/31/2017  . Family history of early CAD 12/31/2017  . Migraines 09/15/2017  . Mild persistent asthma without complication 62/95/2841  . Anxiety state 12/19/2015  . Essential hypertension 10/09/2015  . Nonischemic congestive cardiomyopathy ([place]) 10/09/2015  . Inappropriate sinus tachycardia 04/13/2015  . GERD (gastroesophageal reflux disease)     Past Surgical History:  Procedure Laterality Date  . ABDOMINAL HYSTERECTOMY    . CESAREAN SECTION  03-01-2008  and 2016  . IM PINNING RIGHT ELBOW FX  1992   HARDWARE REMOVED  . LAPAROSCOPIC ASSISTED VAGINAL HYSTERECTOMY Left 02/04/2017   Procedure: LAPAROSCOPIC ASSISTED VAGINAL HYSTERECTOMY, LEFT SALPINGOOPHORECTOMY;  Surgeon: Dian Queen, MD;  Location: [facility];  Service: Gynecology;  Laterality: Left;  NEED BED  . LAPAROSCOPY N/A 07/29/2014   Procedure: LAPAROSCOPY DIAGNOSTIC;  Surgeon: Cyril Mourning, MD;  Location: [facility];  Service: Gynecology;  Laterality: N/A;  . OOPHORECTOMY Left 02/04/2017   Procedure: LEFT OOPHORECTOMY;  Surgeon: Dian Queen, MD;  Location: [facility];  Service: Gynecology;  Laterality: Left;     OB History    Gravida  4   Para  2   Term  2   Preterm      AB  1   Living  2  SAB  1   TAB      Ectopic      Multiple      Live Births               Home Medications    Prior to Admission medications   Medication Sig Start Date End Date Taking? Authorizing Provider  albuterol (PROVENTIL HFA;VENTOLIN HFA) 108 (90 Base) MCG/ACT inhaler Inhale 2 puffs into the lungs every 6 (six) hours as needed for wheezing or shortness of breath. 04/10/17   [name], MD  cephALEXin (KEFLEX) 500 MG capsule Take 1 capsule (500 mg total) by mouth 3 (three) times daily for 7 days. 01/19/19 01/26/19  Jacqlyn Larsen, PA-C  famotidine (PEPCID) 20 MG tablet Take 1 tablet (20 mg total) by mouth at  bedtime. 09/15/17   Jearld Fenton, NP  furosemide (LASIX) 40 MG tablet Take 1 tablet (40 mg total) by mouth 2 (two) times daily. 12/31/17   Imogene Burn, PA-C  metoprolol tartrate (LOPRESSOR) 50 MG tablet Take 1.5 tablets (75 mg total) by mouth 2 (two) times daily. 08/13/18   [name], MD  pantoprazole (PROTONIX) 40 MG tablet Take 1 tablet (40 mg total) by mouth daily. Take 30-60 min before first meal of the day 09/15/17   Jearld Fenton, NP  phenazopyridine (PYRIDIUM) 200 MG tablet Take 1 tablet (200 mg total) by mouth 3 (three) times daily. 01/19/19   Jacqlyn Larsen, PA-C  potassium chloride SA (K-DUR,KLOR-CON) 20 MEQ tablet Take 1 tablet (20 mEq total) by mouth 2 (two) times daily. 12/31/17 03/31/18  Imogene Burn, PA-C  SYMBICORT 80-4.5 MCG/ACT inhaler INHALE 2 PUFFS INTO THE LUNGS AT BEDTIME 11/24/18   Jearld Fenton, NP    Family History Family History  Problem Relation Age of Onset  . Asthma Mother   . Crohn's disease Mother   . Colon polyps Mother   . Hyperlipidemia Father   . Hemophilia Father   . COPD Father        smoked  . Hemophilia Son   . Heart attack Maternal Grandfather   . Diabetes Paternal Grandfather   . Stroke Paternal Grandfather   . Heart failure Brother   . Heart failure Paternal Uncle   . Heart failure Paternal Aunt   . COPD Maternal Grandmother        smoked  . Lung cancer Maternal Grandmother        smoked  . Hypertension Neg Hx     Social History Social History   Tobacco Use  . Smoking status: Former Smoker    Packs/day: 0.50    Years: 10.00    Pack years: 5.00    Types: Cigarettes    Quit date: 02/13/2015    Years since quitting: 3.9  . Smokeless tobacco: Never Used  Substance Use Topics  . Alcohol use: Yes    Alcohol/week: 0.0 standard drinks    Comment: social  . Drug use: No     Allergies   Vicodin [hydrocodone-acetaminophen]   Review of Systems Review of Systems  Constitutional: Negative for chills and fever.   Gastrointestinal: Negative for abdominal pain, nausea and vomiting.  Genitourinary: Positive for frequency and hematuria. Negative for dysuria, flank pain, pelvic pain, vaginal bleeding and vaginal discharge.  Musculoskeletal: Negative for back pain.  All other systems reviewed and are negative.    Physical Exam Updated Vital Signs BP 136/90 (BP Location: Left Arm)   Pulse 95   Temp  98.9 F (37.2 C) (Oral)   Resp 20   Ht 5\' 6"  (1.676 m)   Wt 117.8 kg   LMP 12/09/2016   SpO2 100%   BMI 41.92 kg/m   Physical Exam Vitals signs and nursing note reviewed.  Constitutional:      General: She is not in acute distress.    Appearance: Normal appearance. She is well-developed. She is obese. She is not ill-appearing or diaphoretic.  HENT:     Head: Normocephalic and atraumatic.  Eyes:     General:        Right eye: No discharge.        Left eye: No discharge.  Cardiovascular:     Rate and Rhythm: Normal rate and regular rhythm.     Heart sounds: Normal heart sounds. No murmur. No friction rub. No gallop.   Pulmonary:     Effort: Pulmonary effort is normal. No respiratory distress.     Breath sounds: Normal breath sounds.     Comments: Respirations equal and unlabored, patient able to speak in full sentences, lungs clear to auscultation bilaterally Abdominal:     General: Bowel sounds are normal. There is no distension.     Palpations: Abdomen is soft. There is no mass.     Tenderness: There is no abdominal tenderness. There is no right CVA tenderness, left CVA tenderness or guarding.     Comments: Abdomen soft, nondistended, nontender to palpation in all quadrants without guarding or peritoneal signs, no CVA tenderness bilaterally  Musculoskeletal:        General: No deformity.  Skin:    General: Skin is warm and dry.  Neurological:     Mental Status: She is alert and oriented to person, place, and time.     Coordination: Coordination normal.  Psychiatric:        Behavior:  Behavior normal.      ED Treatments / Results  Labs (all labs ordered are listed, but only abnormal results are displayed) Labs Reviewed  URINALYSIS, ROUTINE W REFLEX MICROSCOPIC - Abnormal; Notable for the following components:      Result Value   APPearance CLOUDY (*)    Specific Gravity, Urine >1.030 (*)    Hgb urine dipstick LARGE (*)    Protein, ur 100 (*)    Nitrite POSITIVE (*)    Leukocytes,Ua MODERATE (*)    All other components within normal limits  URINALYSIS, MICROSCOPIC (REFLEX) - Abnormal; Notable for the following components:   Bacteria, UA FEW (*)    All other components within normal limits  URINE CULTURE    EKG None  Radiology No results found.  Procedures Procedures (including critical care time)  Medications Ordered in ED Medications  cephALEXin (KEFLEX) capsule 500 mg (has no administration in time range)     Initial Impression / Assessment and Plan / ED Course  I have reviewed the triage vital signs and the nursing notes.  Pertinent labs & imaging results that were available during my care of the patient were reviewed by me and considered in my medical decision making (see chart for details).  Presents with hematuria and suprapubic pressure, urinary frequency.  Pt has been diagnosed with a UTI. Pt is afebrile, no CVA tenderness, normotensive, and denies N/V. Pt to be dc home with antibiotics and instructions to follow up with PCP if symptoms persist.   Final Clinical Impressions(s) / ED Diagnoses   Final diagnoses:  Acute cystitis with hematuria    ED Discharge Orders  Ordered    cephALEXin (KEFLEX) 500 MG capsule  3 times daily     01/19/19 1630    phenazopyridine (PYRIDIUM) 200 MG tablet  3 times daily     01/19/19 1630           Jacqlyn Larsen, [place] 01/19/19 1635    Lennice Sites, DO 01/19/19 1746

## 2019-01-19 NOTE — Discharge Instructions (Signed)
Blood in your urine is caused by a urinary tract infection.  Please take antibiotics as directed, you can use Pyridium to help with pressure and urinary frequency.  I have sent off a urine culture and you will be called by phone if any antibiotic changes need to be made.  If symptoms are not improving please call your primary care doctor.  If you develop fevers, persistent vomiting, flank pain, difficulty urinating or any other new or concerning symptoms return to the emergency department.

## 2019-01-22 LAB — URINE CULTURE: Culture: 80000 — AB

## 2019-01-23 ENCOUNTER — Telehealth: Payer: Self-pay | Admitting: Emergency Medicine

## 2019-01-23 NOTE — Telephone Encounter (Signed)
Post ED Visit - Positive Culture Follow-up  Culture report reviewed by antimicrobial stewardship pharmacist: [place] Team []  Elenor Quinones, Pharm.D. []  Heide Guile, Pharm.D., BCPS AQ-ID []  Parks Neptune, Pharm.D., BCPS []  Alycia Rossetti, Pharm.D., BCPS []  [place], Pharm.D., BCPS, AAHIVP []  Legrand Como, Pharm.D., BCPS, AAHIVP []  Salome Arnt, PharmD, BCPS []  Johnnette Gourd, PharmD, BCPS [x]  Hughes Better, PharmD, BCPS []  Leeroy Cha, PharmD []  Laqueta Linden, PharmD, BCPS []  Albertina Parr, PharmD  [place] Team []  Leodis Sias, PharmD []  Lindell Spar, PharmD []  Royetta Asal, PharmD []  Graylin Shiver, Rph []  Rema Fendt) Glennon Mac, PharmD []  Arlyn Dunning, PharmD []  Netta Cedars, PharmD []  Dia Sitter, PharmD []  Leone Haven, PharmD []  Gretta Arab, PharmD []  Theodis Shove, PharmD []  Peggyann Juba, PharmD []  Reuel Boom, PharmD   Positive urine culture Treated with Cephalexin, organism sensitive to the same and no further patient follow-up is required at this time.  Sandi Raveling Lache Dagher 01/23/2019, 2:41 PM

## 2019-02-01 ENCOUNTER — Other Ambulatory Visit: Payer: Managed Care, Other (non HMO)

## 2019-06-21 ENCOUNTER — Other Ambulatory Visit: Payer: Self-pay

## 2019-06-21 DIAGNOSIS — Z20822 Contact with and (suspected) exposure to covid-19: Secondary | ICD-10-CM

## 2019-06-22 LAB — NOVEL CORONAVIRUS, NAA: SARS-CoV-2, NAA: NOT DETECTED

## 2019-10-14 ENCOUNTER — Other Ambulatory Visit: Payer: Self-pay

## 2019-10-14 ENCOUNTER — Encounter: Payer: Self-pay | Admitting: Internal Medicine

## 2019-10-14 ENCOUNTER — Ambulatory Visit (INDEPENDENT_AMBULATORY_CARE_PROVIDER_SITE_OTHER): Payer: Managed Care, Other (non HMO) | Admitting: Internal Medicine

## 2019-10-14 VITALS — HR 98 | Temp 97.9°F

## 2019-10-14 DIAGNOSIS — J4541 Moderate persistent asthma with (acute) exacerbation: Secondary | ICD-10-CM

## 2019-10-14 MED ORDER — PREDNISONE 10 MG PO TABS: ORAL_TABLET | ORAL | 0 refills | Status: DC

## 2019-10-14 MED ORDER — MONTELUKAST SODIUM 10 MG PO TABS: 10.0000 mg | ORAL_TABLET | Freq: Every day | ORAL | 3 refills | Status: DC

## 2019-10-14 MED ORDER — ALBUTEROL SULFATE HFA 108 (90 BASE) MCG/ACT IN AERS: 2.0000 | INHALATION_SPRAY | Freq: Four times a day (QID) | RESPIRATORY_TRACT | 2 refills | Status: DC | PRN

## 2019-10-14 NOTE — Patient Instructions (Signed)
Bronchospasm, Adult  Bronchospasm is when airways in the lungs get smaller. When this happens, it can be hard to breathe. You may cough. You may also make a whistling sound when you breathe (wheeze). Follow these instructions at home: Medicines  Take over-the-counter and prescription medicines only as told by your doctor.  If you need to use an inhaler or nebulizer to take your medicine, ask your doctor how to use it.  If you were given a spacer, always use it with your inhaler. Lifestyle  Change your heating and air conditioning filter. Do this at least once a month.  Try not to use fireplaces and wood stoves.  Do not  smoke. Do not  allow smoking in your home.  Try not to use things that have a strong smell, like perfume.  Get rid of pests (such as roaches and mice) and their poop.  Remove any mold from your home.  Keep your house clean. Get rid of dust.  Use cleaning products that have no smell.  Replace carpet with wood, tile, or vinyl flooring.  Use allergy-proof pillows, mattress covers, and box spring covers.  Wash bed sheets and blankets every week. Use hot water. Dry them in a dryer.  Use blankets that are made of polyester or cotton.  Wash your hands often.  Keep pets out of your bedroom.  When you exercise, try not to breathe in cold air. General instructions  Have a plan for getting medical care. Know these things: ? When to call your doctor. ? When to call local emergency services (911 in the [place] ? Where to go in an emergency.  Stay up to date on your shots (immunizations).  When you have an episode: ? Stay calm. ? Relax. ? Breathe slowly. Contact a doctor if:  Your muscles ache.  Your chest hurts.  The color of the mucus you cough up (sputum) changes from clear or white to yellow, green, gray, or bloody.  The mucus you cough up gets thicker.  You have a fever. Get help right away if:  The whistling sound gets worse, even after you  take your medicines.  Your coughing gets worse.  You find it even harder to breathe.  Your chest hurts very much. Summary  Bronchospasm is when airways in the lungs get smaller.  When this happens, it can be hard to breathe. You may cough. You may also make a whistling sound when you breathe.  Stay away from things that cause you to have episodes. These include smoke or dust. This information is not intended to replace advice given to you by your health care provider. Make sure you discuss any questions you have with your health care provider. Document Revised: 06/13/2017 Document Reviewed: 07/04/2016 Elsevier Patient Education  2020 [employer] Inc[employer]  

## 2019-10-14 NOTE — Progress Notes (Signed)
Virtual Visit via Video Note  I connected with Michelle Kane on 10/14/19 at  3:00 PM EDT by a video enabled telemedicine application and verified that I am speaking with the correct person using two identifiers.  Location: Patient: Home Provider: Office   I discussed the limitations of evaluation and management by telemedicine and the availability of in person appointments. The patient expressed understanding and agreed to proceed.  History of Present Illness:  Pt reports itchy and watery eyes, ear fullness, nasal congestion and cough. This started 3 days ago. She denies eye redness but has had some burning. She denies visual changes. She describes the ear fullness as aching, denies drainage or loss of hearing. She is blowing light green mucous out of her nose. The cough is productive of light green mucous. She denies headache, runny nose, ear pain, sore throat, loss of taste/smell or SOB. She denies fever, chills or body aches. She has not had any exposure to Covid that she is aware of. 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  Past Medical History:  Diagnosis Date  . Allergy-induced asthma    Allergy induced asthma  . Anxiety   . 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 Chronic systolic CHF (congestive heart failure) ([place])   . Essential hypertension    Taken for EF function and tachycardia  . GERD (gastroesophageal reflux disease)   . Headache    migraines  . 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 History of colon polyps   . History of ovarian cyst   . Pelvic hematoma, female   . Pelvic pain in female   . Peripartum cardiomyopathy    a. EF 40-45% in 2016, varying by several echoes. F/u echo 06/2015 showed EF 50%.   Marland Kitchen PONV (postoperative nausea and vomiting)   . Sinus tachycardia    a. 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 b. 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    Current  Outpatient Medications  Medication Sig Dispense Refill  . albuterol (PROVENTIL HFA;VENTOLIN HFA) 108 (90 Base) MCG/ACT inhaler Inhale 2 puffs into the lungs every 6 (six) hours as needed for wheezing or shortness of breath. 1 Inhaler 2  . famotidine (PEPCID) 20 MG tablet Take 1 tablet (20 mg total) by mouth at bedtime. 90 tablet 3  . furosemide (LASIX) 40 MG tablet Take 1 tablet (40 mg total) by mouth 2 (two) times daily. 180 tablet 3  . metoprolol tartrate (LOPRESSOR) 50 MG tablet Take 1.5 tablets (75 mg total) by mouth 2 (two) times daily. 270 tablet 1  . pantoprazole (PROTONIX) 40 MG tablet Take 1 tablet (40 mg total) by mouth daily. Take 30-60 min before first meal of the day 90 tablet 3  . phenazopyridine (PYRIDIUM) 200 MG tablet Take 1 tablet (200 mg total) by mouth 3 (three) times daily. 6 tablet 0  . potassium chloride SA (K-DUR,KLOR-CON) 20 MEQ tablet Take 1 tablet (20 mEq total) by mouth 2 (two) times daily. 180 tablet 3  . SYMBICORT 80-4.5 MCG/ACT inhaler INHALE 2 PUFFS INTO THE LUNGS AT BEDTIME 10.2 g 0   No current facility-administered medications for this visit.    Allergies  Allergen Reactions  . Vicodin [Hydrocodone-Acetaminophen] Itching, Nausea And Vomiting and Other (See Comments)    GI pains also    Family History  Problem Relation Age of Onset  . Asthma Mother   .  Crohn's disease Mother   . Colon polyps Mother   . Hyperlipidemia Father   . Hemophilia Father   . COPD Father        smoked  . Hemophilia Son   . Heart attack Maternal Grandfather   . Diabetes Paternal Grandfather   . Stroke Paternal Grandfather   . Heart failure Brother   . Heart failure Paternal Uncle   . Heart failure Paternal Aunt   . COPD Maternal Grandmother        smoked  . Lung cancer Maternal Grandmother        smoked  . Hypertension Neg Hx     Social History   Socioeconomic History  . Marital status: Married    Spouse name: Not on file  . Number of children: Not on file  .  Years of education: Not on file  . Highest education level: Not on file  Occupational History  . Occupation: CNA   Tobacco Use  . Smoking status: Former Smoker    Packs/day: 0.50    Years: 10.00    Pack years: 5.00    Types: Cigarettes    Quit date: 02/13/2015    Years since quitting: 4.6  . Smokeless tobacco: Never Used  Substance and Sexual Activity  . Alcohol use: Yes    Alcohol/week: 0.0 standard drinks    Comment: social  . Drug use: No  . Sexual activity: Yes    Birth control/protection: Surgical  Other Topics Concern  . Not on file  Social History Narrative  . Not on file   Social Determinants of Health   Financial Resource Strain:   . Difficulty of Paying Living Expenses:   Food Insecurity:   . Worried About Charity fundraiser in the Last Year:   . Arboriculturist in the Last Year:   Transportation Needs:   . Film/video editor (Medical):   Marland Kitchen Lack of Transportation (Non-Medical):   Physical Activity:   . Days of Exercise per Week:   . Minutes of Exercise per Session:   Stress:   . Feeling of Stress :   Social Connections:   . Frequency of Communication with Friends and Family:   . Frequency of Social Gatherings with Friends and Family:   . Attends Religious Services:   . Active Member of Clubs or Organizations:   . Attends Archivist Meetings:   Marland Kitchen Marital Status:   Intimate Partner Violence:   . Fear of Current or Ex-Partner:   . Emotionally Abused:   Marland Kitchen Physically Abused:   . Sexually Abused:      Constitutional: Denies fever, malaise, fatigue, headache or abrupt weight changes.  HEENT: Pt reports ear fullness, nasal congestion. Denies eye pain, eye redness, ear pain, ringing in the ears, wax buildup, runny nose,  bloody nose, or sore throat. Respiratory: Pt reports cough. Denies difficulty breathing, shortness of breath, cough or sputum production.   Cardiovascular: Denies chest pain, chest tightness, palpitations or swelling in the  hands or feet.    No other specific complaints in a complete review of systems (except as listed in HPI above).  Observations/Objective:  LMP 12/09/2016  Wt Readings from Last 3 Encounters:  01/19/19 259 lb 11.2 oz (117.8 kg)  08/13/18 281 lb (127.5 kg)  07/14/18 260 lb (117.9 kg)    General: Appears her stated age, obese, in NAD. Skin: Warm, dry and intact. No rashes noted. HEENT: Head: normal shape and size; Eyes: sclera white, no  purulent drainage reported Nose: congestion noted ; Throat/Mouth: slight hoarseness noted Pulmonary/Chest: Normal effort. No respiratory distress.  Neurological: Alert and oriented.  BMET    Component Value Date/Time   NA 135 07/14/2018 1701   NA 141 12/31/2017 1431   K 3.6 07/14/2018 1701   CL 103 07/14/2018 1701   CO2 24 07/14/2018 1701   GLUCOSE 91 07/14/2018 1701   BUN 11 07/14/2018 1701   BUN 10 12/31/2017 1431   CREATININE 0.63 07/14/2018 1701   CREATININE 0.69 03/14/2016 0940   CALCIUM 9.2 07/14/2018 1701   GFRNONAA >60 07/14/2018 1701   GFRAA >60 07/14/2018 1701    Lipid Panel     Component Value Date/Time   CHOL 190 12/19/2015 0946   TRIG 194.0 (H) 12/19/2015 0946   HDL 36.20 (L) 12/19/2015 0946   CHOLHDL 5 12/19/2015 0946   VLDL 38.8 12/19/2015 0946   LDLCALC 115 (H) 12/19/2015 0946    CBC    Component Value Date/Time   WBC 11.7 (H) 07/14/2018 1701   RBC 4.78 07/14/2018 1701   HGB 14.4 07/14/2018 1701   HCT 44.1 07/14/2018 1701   PLT 255 07/14/2018 1701   MCV 92.3 07/14/2018 1701   MCH 30.1 07/14/2018 1701   MCHC 32.7 07/14/2018 1701   RDW 12.4 07/14/2018 1701   LYMPHSABS 3.2 07/19/2017 2330   MONOABS 0.6 07/19/2017 2330   EOSABS 0.4 07/19/2017 2330   BASOSABS 0.0 07/19/2017 2330    Hgb A1C Lab Results  Component Value Date   HGBA1C 5.1 01/12/2013       Assessment and Plan:  Allergy Induced Asthma:  Continue Allegra and Flonase RX for Singulair 10 mg daily Advised her to take the Symbicort 2 puffs  at bedtime as prescribed Albuterol refilled today RX for Pred Taper x 6 days No indication for abx at this time Referral to allergy and asthma per pt request If no improvement by next Tuesday, will send in Augmentin 875-125 mg BID x 10 days.  Return precautions discussed  Follow Up Instructions:    I discussed the assessment and treatment plan with the patient. The patient was provided an opportunity to ask questions and all were answered. The patient agreed with the plan and demonstrated an understanding of the instructions.   The patient was advised to call back or seek an in-person evaluation if the symptoms worsen or if the condition fails to improve as anticipated.    Webb Silversmith, NP

## 2019-10-18 ENCOUNTER — Telehealth: Payer: Self-pay

## 2019-10-18 NOTE — Telephone Encounter (Signed)
Called patient and left message to call back or send mychart message in regards to referral to allergy and asthma office, if she had preference on location.

## 2019-11-14 ENCOUNTER — Other Ambulatory Visit: Payer: Self-pay | Admitting: Internal Medicine

## 2019-12-02 ENCOUNTER — Ambulatory Visit (INDEPENDENT_AMBULATORY_CARE_PROVIDER_SITE_OTHER): Payer: Managed Care, Other (non HMO) | Admitting: Allergy and Immunology

## 2019-12-02 ENCOUNTER — Other Ambulatory Visit: Payer: Self-pay

## 2019-12-02 ENCOUNTER — Encounter: Payer: Self-pay | Admitting: Allergy and Immunology

## 2019-12-02 VITALS — BP 130/84 | HR 95 | Temp 98.2°F | Resp 24 | Ht 67.0 in | Wt 287.0 lb

## 2019-12-02 DIAGNOSIS — J4540 Moderate persistent asthma, uncomplicated: Secondary | ICD-10-CM | POA: Diagnosis not present

## 2019-12-02 DIAGNOSIS — L500 Allergic urticaria: Secondary | ICD-10-CM | POA: Insufficient documentation

## 2019-12-02 DIAGNOSIS — L509 Urticaria, unspecified: Secondary | ICD-10-CM | POA: Insufficient documentation

## 2019-12-02 DIAGNOSIS — H1010 Acute atopic conjunctivitis, unspecified eye: Secondary | ICD-10-CM | POA: Insufficient documentation

## 2019-12-02 DIAGNOSIS — H1013 Acute atopic conjunctivitis, bilateral: Secondary | ICD-10-CM | POA: Diagnosis not present

## 2019-12-02 DIAGNOSIS — J3089 Other allergic rhinitis: Secondary | ICD-10-CM | POA: Insufficient documentation

## 2019-12-02 MED ORDER — OLOPATADINE HCL 0.2 % OP SOLN
OPHTHALMIC | 5 refills | Status: DC
Start: 2019-12-02 — End: 2021-05-17

## 2019-12-02 MED ORDER — BUDESONIDE-FORMOTEROL FUMARATE 160-4.5 MCG/ACT IN AERO: INHALATION_SPRAY | RESPIRATORY_TRACT | 5 refills | Status: DC

## 2019-12-02 MED ORDER — ALBUTEROL SULFATE HFA 108 (90 BASE) MCG/ACT IN AERS
2.0000 | INHALATION_SPRAY | RESPIRATORY_TRACT | 1 refills | Status: DC | PRN
Start: 2019-12-02 — End: 2022-11-05

## 2019-12-02 MED ORDER — XHANCE 93 MCG/ACT NA EXHU: INHALANT_SUSPENSION | NASAL | 5 refills | Status: DC

## 2019-12-02 MED ORDER — LEVOCETIRIZINE DIHYDROCHLORIDE 5 MG PO TABS: ORAL_TABLET | ORAL | 5 refills | Status: DC

## 2019-12-02 NOTE — Assessment & Plan Note (Signed)
   Treatment plan as outlined above for allergic rhinitis.  A prescription has been provided for generic Pataday, one drop per eye daily as needed.  If insurance does not cover this medication, it may be purchased over-the-counter as Lawyer.  I have also recommended eye lubricant drops (i.e., Natural Tears) as needed.

## 2019-12-02 NOTE — Assessment & Plan Note (Signed)
>>  ASSESSMENT AND PLAN FOR OTHER ALLERGIC RHINITIS WRITTEN ON 12/02/2019  3:08 PM BY BOBBITT, Heywood Iles, MD  Aeroallergen avoidance measures have been discussed and provided in written form. A prescription has been provided for levocetirizine(Xyzal), 5 mg daily as needed. To avoid diminishing benefit with daily use (tachyphylaxis) of second generation antihistamine, consider alternating every few months between fexofenadine (Allegra) and levocetirizine (Xyzal). A prescription has been provided for [facility], 2 actuations per nostril twice a day as needed. Proper technique has been discussed and demonstrated. Nasal saline spray (i.e., Simply Saline) or nasal saline lavage (i.e., NeilMed) is recommended as needed and prior to medicated nasal sprays. The risks and benefits of aeroallergen immunotherapy have been discussed. The patient is motivated to initiate immunotherapy if insurance coverage is favorable. She will let us know how she would like to proceed.

## 2019-12-02 NOTE — Assessment & Plan Note (Addendum)
   Aeroallergen avoidance measures have been discussed and provided in written form.  A prescription has been provided for levocetirizine(Xyzal), 5 mg daily as needed.  To avoid diminishing benefit with daily use (tachyphylaxis) of second generation antihistamine, consider alternating every few months between fexofenadine (Allegra) and levocetirizine (Xyzal).  A prescription has been provided for [facility], 2 actuations per nostril twice a day as needed. Proper technique has been discussed and demonstrated.  Nasal saline spray (i.e., Simply Saline) or nasal saline lavage (i.e., NeilMed) is recommended as needed and prior to medicated nasal sprays.  The risks and benefits of aeroallergen immunotherapy have been discussed. The patient is motivated to initiate immunotherapy if insurance coverage is favorable. She will let us know how she would like to proceed.

## 2019-12-02 NOTE — Progress Notes (Signed)
New Patient Note  RE: Michelle Kane MRN: [id number] DOB: Jul 07, 1986 Date of Office Visit: 12/02/2019  Referring provider: Jearld Fenton, NP Primary care provider: Jearld Fenton, NP  Chief Complaint: Asthma, Allergic Rhinitis , and Urticaria   History of present illness: Michelle Kane is a 34 y.o. female seen today in consultation requested by Webb Silversmith, NP. She reports that she was diagnosed with asthma when she was 34 years old.  Her asthma symptoms includes chest tightness, wheezing, dyspnea, and coughing.  These lower respiratory symptoms are triggered by exposure to dogs, cats, pollen, heat, and when she exercises.  She is currently taking Symbicort 80-4.5 g, 2 inhalations at bedtime, and montelukast 10 mg daily at bedtime.  She does not use a spacer device with her HFA inhalers.  While on this regimen of Symbicort and montelukast, she typically experiences asthma symptoms 1-2 times per day and nocturnal awakenings due to lower respiratory symptoms 3-4 times per month.  She has noticed that her anxiety and depression have increased while taking montelukast. She experiences nasal congestion, rhinorrhea, sneezing, postnasal drainage, nasal pruritus, ocular pruritus, and chemosis.  The symptoms occur year-round but are most frequent and severe during the springtime and, to a lesser extent, in the fall.  Animal dander and pollens seem to be the most significant triggers.  She attempts to control these symptoms with fexofenadine, montelukast, and fluticasone nasal spray.  She has been taking fexofenadine daily for the past 2 years, however recently she has noticed that the benefit of fexofenadine has waned. She reports that over the past 2 years she has experienced "on and off" generalized urticaria.  The hives are described as red, raised, and pruritic.  She reports that the hives and generalized pruritus tend to be relatively well controlled with fexofenadine daily, however if she is not  taking the fexofenadine she develops hives and begins "itching from head to toe."  She has not experience concomitant angioedema, cardiopulmonary symptoms, or GI symptoms.  She has not experienced unexpected weight loss or recurrent fevers.  Assessment and plan: Moderate persistent asthma Currently with suboptimal control.  In addition, todays spirometry results, assessed while asymptomatic, suggest under-perception of bronchoconstriction.  A prescription has been provided for Symbicort (budesonide/formoterol) 160/4.5 g, 2 inhalations twice a day. To maximize pulmonary deposition, a spacer has been provided along with instructions for its proper administration with an HFA inhaler.  Due to perceived side effects, montelukast will be discontinued at this time.  Continue albuterol HFA, 1 to 2 inhalations every 4-6 hours if needed.  Albuterol may also be used 10 to 15 minutes prior to exercise.  Subjective and objective measures of pulmonary function will be followed and the treatment plan will be adjusted accordingly.  Other allergic rhinitis  Aeroallergen avoidance measures have been discussed and provided in written form.  A prescription has been provided for levocetirizine(Xyzal), 5 mg daily as needed.  To avoid diminishing benefit with daily use (tachyphylaxis) of second generation antihistamine, consider alternating every few months between fexofenadine (Allegra) and levocetirizine (Xyzal).  A prescription has been provided for [facility], 2 actuations per nostril twice a day as needed. Proper technique has been discussed and demonstrated.  Nasal saline spray (i.e., Simply Saline) or nasal saline lavage (i.e., NeilMed) is recommended as needed and prior to medicated nasal sprays.  The risks and benefits of aeroallergen immunotherapy have been discussed. The patient is motivated to initiate immunotherapy if insurance coverage is favorable. She will let us know  how she would like to  proceed.  Allergic conjunctivitis  Treatment plan as outlined above for allergic rhinitis.  A prescription has been provided for generic Pataday, one drop per eye daily as needed.  If insurance does not cover this medication, it may be purchased over-the-counter as Lawyer.  I have also recommended eye lubricant drops (i.e., Natural Tears) as needed.  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 Skin tests to select food allergens were negative today.  Physical urticarias are negative by history (i.e. pressure-induced, temperature, vibration, solar, etc.). History and lesions are not consistent with urticaria pigmentosa so I am not suspicious for mastocytosis. There are no concomitant symptoms concerning for anaphylaxis or constitutional symptoms worrisome for an underlying malignancy.   We will not order labs at this time, however, if lesions recur, persist, progress, or change in character in the absence of pollen exposure, we will assess potential etiologies with screening labs.  For symptom relief, patient is to take oral antihistamines as directed.  Instructions have been discussed and provided for H1/H2 receptor blockade with titration to find lowest effective dose.    To hasten symptom relief, prednisone has been provided, 20 mg x 4 days, 10 mg x1 day, then stop.  Should symptoms recur in the absence of overt aeroallergen exposure, a journal is to be kept recording any foods eaten, beverages consumed, medications taken within a 6 hour period prior to the onset of symptoms, as well as record activities being performed, and environmental conditions. For any symptoms concerning for anaphylaxis, 911 is to be called immediately.   Meds ordered this encounter  Medications  . budesonide-formoterol (SYMBICORT) 160-4.5 MCG/ACT inhaler    Sig: 2 puffs twice daily with spacer to prevent coughing or  wheezing. Rinse,gargle and spit after use.    Dispense:  1 Inhaler    Refill:  5  . albuterol (VENTOLIN HFA) 108 (90 Base) MCG/ACT inhaler    Sig: Inhale 2 puffs into the lungs every 4 (four) hours as needed for wheezing or shortness of breath.    Dispense:  18 g    Refill:  1    Please keep rx on hold. Pt. Will call when needed.  . Fluticasone Propionate (XHANCE) 93 MCG/ACT EXHU    Sig: 2 sprays per nostril twice a day if needed for stuffy nose.    Dispense:  32 mL    Refill:  5  . levocetirizine (XYZAL) 5 MG tablet    Sig: Take 1 tablet daily as needed for runny nose or itchy eyes    Dispense:  30 tablet    Refill:  5  . Olopatadine HCl (PATADAY) 0.2 % SOLN    Sig: 1 drop each eye as needed for itchy eyes.    Dispense:  2.5 mL    Refill:  5    Diagnostics: Spirometry: FVC was 3.62 L and FEV1 was 2.82 L (82% predicted) with significant (390 mL, 14%) postbronchodilator improvement.  This study was performed while the patient was asymptomatic.  Please see scanned spirometry results for details. Environmental skin testing: Positive to grass pollen, weed pollen, ragweed pollen, tree pollen, molds, cat hair, dog epithelia, and dust mite antigen. Food allergen skin testing: Negative despite a positive histamine control.    Physical examination: Blood pressure 130/84, pulse 95, temperature 98.2 F (36.8 C), temperature source Oral, resp. rate (!) 24, height 5\' 7"  (1.702 m), weight 287 lb (130.2 kg), last  menstrual period 12/09/2016, SpO2 98 %.  General: Alert, interactive, in no acute distress. HEENT: TMs pearly gray, turbinates markedly edematous with clear discharge, post-pharynx erythematous. Neck: Supple without lymphadenopathy. Lungs: Good air movement with slight expiratory wheeze on the right. CV: Normal S1, S2 without murmurs. Abdomen: Nondistended, nontender. Skin: Warm and dry, without lesions or rashes. Extremities:  No clubbing, cyanosis or edema. Neuro:   Grossly  intact.  Review of systems:  Review of systems negative except as noted in HPI / PMHx or noted below: Review of Systems  Constitutional: Negative.   HENT: Negative.   Eyes: Negative.   Respiratory: Negative.   Cardiovascular: Negative.   Gastrointestinal: Negative.   Genitourinary: Negative.   Musculoskeletal: Negative.   Skin: Negative.   Neurological: Negative.   Endo/Heme/Allergies: Negative.   Psychiatric/Behavioral: Negative.     Past medical history:  Past Medical History:  Diagnosis Date  . Allergy-induced asthma    Allergy induced asthma  . Anxiety   . Bronchitis   . 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 Chronic systolic CHF (congestive heart failure) ([place])   . Essential hypertension    Taken for EF function and tachycardia  . GERD (gastroesophageal reflux disease)   . Headache    migraines  . 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 History of colon polyps   . History of ovarian cyst   . Pelvic hematoma, female   . Pelvic pain in female   . Peripartum cardiomyopathy    a. EF 40-45% in 2016, varying by several echoes. F/u echo 06/2015 showed EF 50%.   Marland Kitchen PONV (postoperative nausea and vomiting)   . Sinus tachycardia    a. 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 b. 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    Past surgical history:  Past Surgical History:  Procedure Laterality Date  . ABDOMINAL HYSTERECTOMY    . CESAREAN SECTION  03-01-2008  and 2016  . IM PINNING RIGHT ELBOW FX  1992   HARDWARE REMOVED  . LAPAROSCOPIC ASSISTED VAGINAL HYSTERECTOMY Left 02/04/2017   Procedure: LAPAROSCOPIC ASSISTED VAGINAL HYSTERECTOMY, LEFT SALPINGOOPHORECTOMY;  Surgeon: Dian Queen, MD;  Location: [place];  Service: Gynecology;  Laterality: Left;  NEED BED  . LAPAROSCOPY N/A 07/29/2014   Procedure: LAPAROSCOPY DIAGNOSTIC;  Surgeon: Cyril Mourning, MD;  Location: [place];  Service: Gynecology;  Laterality: N/A;  . OOPHORECTOMY Left 02/04/2017   Procedure: LEFT OOPHORECTOMY;  Surgeon: Dian Queen, MD;  Location: [facility];  Service: Gynecology;  Laterality: Left;    Family history: Family History  Problem Relation Age of Onset  . Asthma Mother   . Crohn's disease Mother   . Colon polyps Mother   . Allergic rhinitis Mother   . Hyperlipidemia Father   . Hemophilia Father   . COPD Father        smoked  . Urticaria Father   . Hemophilia Son   . Allergic rhinitis Son   . Food Allergy Son   . Heart attack Maternal Grandfather   . Diabetes Paternal Grandfather   . Stroke Paternal Grandfather   . Heart failure Brother   . Heart failure Paternal Uncle   . Heart failure Paternal Aunt   . COPD Maternal Grandmother        smoked  . Lung cancer Maternal Grandmother        smoked  . Allergic  rhinitis Sister   . Asthma Niece   . Allergic rhinitis Daughter   . Hypertension Neg Hx   . Eczema Neg Hx   . Angioedema Neg Hx   . Atopy Neg Hx   . Immunodeficiency Neg Hx     Social history: Social History   Socioeconomic History  . Marital status: Married    Spouse name: Not on file  . Number of children: Not on file  . Years of education: Not on file  . Highest education level: Not on file  Occupational History  . Occupation: CNA   Tobacco Use  . Smoking status: Former Smoker    Packs/day: 0.50    Years: 10.00    Pack years: 5.00    Types: Cigarettes    Quit date: 02/13/2015    Years since quitting: 4.8  . Smokeless tobacco: Never Used  Substance and Sexual Activity  . Alcohol use: Yes    Alcohol/week: 0.0 standard drinks    Comment: social  . Drug use: No  . Sexual activity: Yes    Birth control/protection: Surgical  Other Topics Concern  . Not on file  Social History Narrative  . Not on file   Social Determinants of Health   Financial Resource Strain:   . Difficulty of Paying Living Expenses:   Food  Insecurity:   . Worried About Charity fundraiser in the Last Year:   . Arboriculturist in the Last Year:   Transportation Needs:   . Film/video editor (Medical):   Marland Kitchen Lack of Transportation (Non-Medical):   Physical Activity:   . Days of Exercise per Week:   . Minutes of Exercise per Session:   Stress:   . Feeling of Stress :   Social Connections:   . Frequency of Communication with Friends and Family:   . Frequency of Social Gatherings with Friends and Family:   . Attends Religious Services:   . Active Member of Clubs or Organizations:   . Attends Archivist Meetings:   Marland Kitchen Marital Status:   Intimate Partner Violence:   . Fear of Current or Ex-Partner:   . Emotionally Abused:   Marland Kitchen Physically Abused:   . Sexually Abused:     Environmental History: The patient lives in a 34 year old house with carpeting throughout and central air/heat.  There is no known mold/water damage in the home.  There are no pets in the home.  She is a non-smoker.  Current Outpatient Medications  Medication Sig Dispense Refill  . albuterol (VENTOLIN HFA) 108 (90 Base) MCG/ACT inhaler Inhale 2 puffs into the lungs every 6 (six) hours as needed for wheezing or shortness of breath. 18 g 2  . fluticasone (FLONASE) 50 MCG/ACT nasal spray Place into both nostrils daily.    Marland Kitchen LORazepam (ATIVAN) 1 MG tablet Take 1 mg by mouth every 6 (six) hours as needed for anxiety.    . montelukast (SINGULAIR) 10 MG tablet Take 1 tablet (10 mg total) by mouth at bedtime. 30 tablet 3  . pantoprazole (PROTONIX) 40 MG tablet Take 1 tablet (40 mg total) by mouth daily. Take 30-60 min before first meal of the day 90 tablet 3  . PARoxetine (PAXIL) 40 MG tablet Take 40 mg by mouth every morning.    . SYMBICORT 80-4.5 MCG/ACT inhaler INHALE 2 PUFFS INTO THE LUNGS AT BEDTIME.. 10.2 g 0  . albuterol (VENTOLIN HFA) 108 (90 Base) MCG/ACT inhaler Inhale 2 puffs into the lungs every 4 (  four) hours as needed for wheezing or  shortness of breath. 18 g 1  . budesonide-formoterol (SYMBICORT) 160-4.5 MCG/ACT inhaler 2 puffs twice daily with spacer to prevent coughing or wheezing. Rinse,gargle and spit after use. 1 Inhaler 5  . Fluticasone Propionate (XHANCE) 93 MCG/ACT EXHU 2 sprays per nostril twice a day if needed for stuffy nose. 32 mL 5  . levocetirizine (XYZAL) 5 MG tablet Take 1 tablet daily as needed for runny nose or itchy eyes 30 tablet 5  . Olopatadine HCl (PATADAY) 0.2 % SOLN 1 drop each eye as needed for itchy eyes. 2.5 mL 5   No current facility-administered medications for this visit.    Known medication allergies: Allergies  Allergen Reactions  . Vicodin [Hydrocodone-Acetaminophen] Itching, Nausea And Vomiting and Other (See Comments)    GI pains also  . Labetalol     Causes congestive heart failure    I appreciate the opportunity to take part in Michelle Kane's care. Please do not hesitate to contact me with questions.  Sincerely,   R. Edgar Frisk, MD

## 2019-12-02 NOTE — Assessment & Plan Note (Addendum)
Currently with suboptimal control.  In addition, todays spirometry results, assessed while asymptomatic, suggest under-perception of bronchoconstriction.  A prescription has been provided for Symbicort (budesonide/formoterol) 160/4.5 g, 2 inhalations twice a day. To maximize pulmonary deposition, a spacer has been provided along with instructions for its proper administration with an HFA inhaler.  Due to perceived side effects, montelukast will be discontinued at this time.  Continue albuterol HFA, 1 to 2 inhalations every 4-6 hours if needed.  Albuterol may also be used 10 to 15 minutes prior to exercise.  Subjective and objective measures of pulmonary function will be followed and the treatment plan will be adjusted accordingly.

## 2019-12-02 NOTE — Patient Instructions (Addendum)
Moderate persistent asthma Currently with suboptimal control.  In addition, todays spirometry results, assessed while asymptomatic, suggest under-perception of bronchoconstriction.  A prescription has been provided for Symbicort (budesonide/formoterol) 160/4.5 g, 2 inhalations twice a day. To maximize pulmonary deposition, a spacer has been provided along with instructions for its proper administration with an HFA inhaler.  Due to perceived side effects, montelukast will be discontinued at this time.  Continue albuterol HFA, 1 to 2 inhalations every 4-6 hours if needed.  Albuterol may also be used 10 to 15 minutes prior to exercise.  Subjective and objective measures of pulmonary function will be followed and the treatment plan will be adjusted accordingly.  Other allergic rhinitis  Aeroallergen avoidance measures have been discussed and provided in written form.  A prescription has been provided for levocetirizine(Xyzal), 5 mg daily as needed.  To avoid diminishing benefit with daily use (tachyphylaxis) of second generation antihistamine, consider alternating every few months between fexofenadine (Allegra) and levocetirizine (Xyzal).  A prescription has been provided for [facility], 2 actuations per nostril twice a day as needed. Proper technique has been discussed and demonstrated.  Nasal saline spray (i.e., Simply Saline) or nasal saline lavage (i.e., NeilMed) is recommended as needed and prior to medicated nasal sprays.  The risks and benefits of aeroallergen immunotherapy have been discussed. The patient is motivated to initiate immunotherapy if insurance coverage is favorable. She will let us know how she would like to proceed.  Allergic conjunctivitis  Treatment plan as outlined above for allergic rhinitis.  A prescription has been provided for generic Pataday, one drop per eye daily as needed.  If insurance does not cover this medication, it may be purchased over-the-counter as  Lawyer.  I have also recommended eye lubricant drops (i.e., Natural Tears) as needed.  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 Skin tests to select food allergens were negative today.  Physical urticarias are negative by history (i.e. pressure-induced, temperature, vibration, solar, etc.). History and lesions are not consistent with urticaria pigmentosa so I am not suspicious for mastocytosis. There are no concomitant symptoms concerning for anaphylaxis or constitutional symptoms worrisome for an underlying malignancy.   We will not order labs at this time, however, if lesions recur, persist, progress, or change in character in the absence of pollen exposure, we will assess potential etiologies with screening labs.  For symptom relief, patient is to take oral antihistamines as directed.  Instructions have been discussed and provided for H1/H2 receptor blockade with titration to find lowest effective dose.    To hasten symptom relief, prednisone has been provided, 20 mg x 4 days, 10 mg x1 day, then stop.  Should symptoms recur in the absence of overt aeroallergen exposure, a journal is to be kept recording any foods eaten, beverages consumed, medications taken within a 6 hour period prior to the onset of symptoms, as well as record activities being performed, and environmental conditions. For any symptoms concerning for anaphylaxis, 911 is to be called immediately.   Return in about 6 weeks (around 01/13/2020), or if symptoms worsen or fail to improve.   Urticaria (Hives)  . Levocetirizine (Xyzal) 5 mg twice a day and famotidine (Pepcid) 20 mg twice a day. If no symptoms for 7-14 days then decrease to. . Levocetirizine (Xyzal) 5 mg twice a day and famotidine (Pepcid) 20 mg once a day.  If no symptoms for 7-14 days then decrease to. . Levocetirizine (Xyzal) 5 mg  twice a day.  If no symptoms for  7-14 days then decrease to. . Levocetirizine (Xyzal) 5 mg once a day.  May use Benadryl (diphenhydramine) as needed for breakthrough symptoms       If symptoms return, then step up dosage  Control of Dust Mite Allergen  House dust mites play a major role in allergic asthma and rhinitis.  They occur in environments with high humidity wherever human skin, the food for dust mites is found. High levels have been detected in dust obtained from mattresses, pillows, carpets, upholstered furniture, bed covers, clothes and soft toys.  The principal allergen of the house dust mite is found in its feces.  A gram of dust may contain 1,000 mites and 250,000 fecal particles.  Mite antigen is easily measured in the air during house cleaning activities.    1. Encase mattresses, including the box spring, and pillow, in an air tight cover.  Seal the zipper end of the encased mattresses with wide adhesive tape. 2. Wash the bedding in water of 130 degrees Farenheit weekly.  Avoid cotton comforters/quilts and flannel bedding: the most ideal bed covering is the dacron comforter. 3. Remove all upholstered furniture from the bedroom. 4. Remove carpets, carpet padding, rugs, and non-washable window drapes from the bedroom.  Wash drapes weekly or use plastic window coverings. 5. Remove all non-washable stuffed toys from the bedroom.  Wash stuffed toys weekly. 6. Have the room cleaned frequently with a vacuum cleaner and a damp dust-mop.  The patient should not be in a room which is being cleaned and should wait 1 hour after cleaning before going into the room. 7. Close and seal all heating outlets in the bedroom.  Otherwise, the room will become filled with dust-laden air.  An electric heater can be used to heat the room. Reduce indoor humidity to less than 50%.  Do not use a humidifier.   Reducing Pollen Exposure  The American Academy of Allergy, Asthma and Immunology suggests the following steps to reduce your  exposure to pollen during allergy seasons.    1. Do not hang sheets or clothing out to dry; pollen may collect on these items. 2. Do not mow lawns or spend time around freshly cut grass; mowing stirs up pollen. 3. Keep windows closed at night.  Keep car windows closed while driving. 4. Minimize morning activities outdoors, a time when pollen counts are usually at their highest. 5. Stay indoors as much as possible when pollen counts or humidity is high and on windy days when pollen tends to remain in the air longer. 6. Use air conditioning when possible.  Many air conditioners have filters that trap the pollen spores. 7. Use a HEPA room air filter to remove pollen form the indoor air you breathe.   Control of Dog or Cat Allergen  Avoidance is the best way to manage a dog or cat allergy. If you have a dog or cat and are allergic to dog or cats, consider removing the dog or cat from the home. If you have a dog or cat but don't want to find it a new home, or if your family wants a pet even though someone in the household is allergic, here are some strategies that may help keep symptoms at bay:  1. Keep the pet out of your bedroom and restrict it to only a few rooms. Be advised that keeping the dog or cat in only one room will not limit the allergens to that room.  2. Don't pet, hug or kiss the dog or cat; if you do, wash your hands with soap and water. 3. High-efficiency particulate air (HEPA) cleaners run continuously in a bedroom or living room can reduce allergen levels over time. 4. Place electrostatic material sheet in the air inlet vent in the bedroom. 5. Regular use of a high-efficiency vacuum cleaner or a central vacuum can reduce allergen levels. 6. Giving your dog or cat a bath at least once a week can reduce airborne allergen.   Control of Mold Allergen  Mold and fungi can grow on a variety of surfaces provided certain temperature and moisture conditions exist.  Outdoor molds grow on  plants, decaying vegetation and soil.  The major outdoor mold, Alternaria and Cladosporium, are found in very high numbers during hot and dry conditions.  Generally, a late Summer - Fall peak is seen for common outdoor fungal spores.  Rain will temporarily lower outdoor mold spore count, but counts rise rapidly when the rainy period ends.  The most important indoor molds are Aspergillus and Penicillium.  Dark, humid and poorly ventilated basements are ideal sites for mold growth.  The next most common sites of mold growth are the bathroom and the kitchen.  Outdoor [employer] 1. Use air conditioning and keep windows closed 2. Avoid exposure to decaying vegetation. 3. Avoid leaf raking. 4. Avoid grain handling. 5. Consider wearing a face mask if working in moldy areas.  Indoor Mold Control 1. Maintain humidity below 50%. 2. Clean washable surfaces with 5% bleach solution. 3. Remove sources e.g. Contaminated carpets.

## 2019-12-02 NOTE — Assessment & Plan Note (Addendum)
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 Skin tests to select food allergens were negative today.  Physical urticarias are negative by history (i.e. pressure-induced, temperature, vibration, solar, etc.). History and lesions are not consistent with urticaria pigmentosa so I am not suspicious for mastocytosis. There are no concomitant symptoms concerning for anaphylaxis or constitutional symptoms worrisome for an underlying malignancy.   We will not order labs at this time, however, if lesions recur, persist, progress, or change in character in the absence of pollen exposure, we will assess potential etiologies with screening labs.  For symptom relief, patient is to take oral antihistamines as directed.  Instructions have been discussed and provided for H1/H2 receptor blockade with titration to find lowest effective dose.    To hasten symptom relief, prednisone has been provided, 20 mg x 4 days, 10 mg x1 day, then stop.  Should symptoms recur in the absence of overt aeroallergen exposure, a journal is to be kept recording any foods eaten, beverages consumed, medications taken within a 6 hour period prior to the onset of symptoms, as well as record activities being performed, and environmental conditions. For any symptoms concerning for anaphylaxis, 911 is to be called immediately.

## 2020-01-25 ENCOUNTER — Ambulatory Visit: Payer: Managed Care, Other (non HMO) | Admitting: Allergy and Immunology

## 2020-01-28 ENCOUNTER — Other Ambulatory Visit: Payer: Self-pay

## 2020-01-28 ENCOUNTER — Encounter: Payer: Self-pay | Admitting: Family Medicine

## 2020-01-28 ENCOUNTER — Telehealth (INDEPENDENT_AMBULATORY_CARE_PROVIDER_SITE_OTHER): Payer: Managed Care, Other (non HMO) | Admitting: Family Medicine

## 2020-01-28 VITALS — Temp 98.6°F | Ht 67.0 in | Wt 287.0 lb

## 2020-01-28 DIAGNOSIS — J019 Acute sinusitis, unspecified: Secondary | ICD-10-CM

## 2020-01-28 MED ORDER — AMOXICILLIN-POT CLAVULANATE 875-125 MG PO TABS: 1.0000 | ORAL_TABLET | Freq: Two times a day (BID) | ORAL | 0 refills | Status: DC

## 2020-01-28 NOTE — Progress Notes (Signed)
Virtual visit completed through WebEx or similar program Patient location: home  Provider location: [place] at [facility], office  Participants: Patient and me (unless stated otherwise below)  Pandemic considerations d/w pt.   Limitations and rationale for visit method d/w patient.  Patient agreed to proceed.   CC: URI sx.    HPI:  Sx started about 7-10 days ago.  Has been on baseline meds.  Started with change in taste and smell- not loss of either.  Nasal congestion.  No known covid exposures.  She hasn't been vaccinated for covid yet.  Advised patient to get covid test and she agreed.  She can get that done at local pharmacy- she verified that.  Ears are stuffy.  No fevers.  No vomiting, no diarrhea.  No rash.  Minimal cough except for throat irritation.  She has frontal and maxillary pain.  Some tooth pain, upper tooth pain.  No recent need to SABA.  Still on symbicort.    Current Outpatient Medications on File Prior to Visit  Medication Sig Dispense Refill   albuterol (VENTOLIN HFA) 108 (90 Base) MCG/ACT inhaler Inhale 2 puffs into the lungs every 6 (six) hours as needed for wheezing or shortness of breath. 18 g 2   budesonide-formoterol (SYMBICORT) 160-4.5 MCG/ACT inhaler 2 puffs twice daily with spacer to prevent coughing or wheezing. Rinse,gargle and spit after use. 1 Inhaler 5   Fluticasone Propionate (XHANCE) 93 MCG/ACT EXHU 2 sprays per nostril twice a day if needed for stuffy nose. 32 mL 5   LORazepam (ATIVAN) 1 MG tablet Take 1 mg by mouth every 6 (six) hours as needed for anxiety.     Olopatadine HCl (PATADAY) 0.2 % SOLN 1 drop each eye as needed for itchy eyes. 2.5 mL 5   pantoprazole (PROTONIX) 40 MG tablet Take 1 tablet (40 mg total) by mouth daily. Take 30-60 min before first meal of the day 90 tablet 3   PARoxetine (PAXIL) 40 MG tablet Take 40 mg by mouth every morning.     albuterol (VENTOLIN HFA) 108 (90 Base) MCG/ACT inhaler Inhale 2 puffs into the lungs every  4 (four) hours as needed for wheezing or shortness of breath. 18 g 1   No current facility-administered medications on file prior to visit.    Meds and allergies reviewed.   ROS: Per HPI unless specifically indicated in ROS section   NAD Speech wnl  A/P:  D/w pt about options.  She can get covid test at [facility].  She'll update [place] if positive.    Assumed sinus infection.  Start augmentin.  Rest and fluids.  Continue baseline med (esp symbicort) and can use SABA prn.  Supportive care o/w.  She'll update [place] as needed.   Also to update social hx.  She is now at home caring for her child.  Her child's father was recently killed in [place].  Condolences offered.  I thanked her for her effort.  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

## 2020-01-28 NOTE — Assessment & Plan Note (Addendum)
D/w pt about options.  She can get covid test at [facility].  She'll update [place] if positive.    Assumed sinus infection.  Start augmentin.  Rest and fluids.  Continue baseline med (esp symbicort) and can use SABA prn.  Supportive care o/w.  She'll update [place] as needed.   Also to update social hx.  She is now at home caring for her child.  Her child's father was recently killed in [place].  Condolences offered.  I thanked her for her effort.  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

## 2020-01-31 ENCOUNTER — Telehealth: Payer: Self-pay

## 2020-01-31 MED ORDER — AZELASTINE HCL 0.1 % NA SOLN: 1.0000 | Freq: Two times a day (BID) | NASAL | 5 refills | Status: DC | PRN

## 2020-01-31 NOTE — Addendum Note (Signed)
Addended by: Lucrezia Starch I on: 01/31/2020 01:50 PM   Modules accepted: Orders

## 2020-01-31 NOTE — Telephone Encounter (Signed)
I believe that we write for 2 sprays per nostril twice daily, they will send 2 bottles to her each month, therefore if she took 2 sprays per nostril daily it woud effectively cost her $25 per month to use the [facility].  However, I am fine sending in a prescription for azelastine, 1 to 2 sprays per nostril twice daily as needed.  Asked the patient what her preferences based upon that information and we will go with that. Thanks.

## 2020-01-31 NOTE — Telephone Encounter (Signed)
Truett Perna is not covered by patient's insurance Psychologist, counselling). She can receive the medication at 50.00 per bottle or we can switch to an alternative that is covered. I am showing that azelastine 0.1% is on the formulary.

## 2020-01-31 NOTE — Telephone Encounter (Signed)
I spoke with patient and advised her of the cost of the [place] due to it not being covered by her insurance plan. I let her know that [place] charges 50.00 per bottle of the [facility] as previously informed to writer by a pharmacy rep so her prescription calls for 2 bottles and she told me that she would not be able to do that. I did let her know of the alternative and she was agreeable with this. Prescription has been sent to her local retail pharmacy.

## 2020-03-09 ENCOUNTER — Telehealth: Payer: Self-pay | Admitting: Cardiology

## 2020-03-09 NOTE — Telephone Encounter (Signed)
   Pt c/o of Chest Pain: STAT if CP now or developed within 24 hours  1. Are you having CP right now? No  2. Are you experiencing any other symptoms (ex. SOB, nausea, vomiting, sweating)?   3. How long have you been experiencing CP? 2 weeks  4. Is your CP continuous or coming and going? Coming and going   5. Have you taken Nitroglycerin? No ? Pt would like to set up appt with Dr. Meda Coffee, she said she's been having chest discomfort for 2 weeks now, gave APP first available on 09/24 she said she can't wait that long and ask for RN Karlene Einstein to call her back

## 2020-03-09 NOTE — Telephone Encounter (Signed)
Left the pt a message to call the office back.  Did hold her a spot on Truitt Merle NP's schedule for 9/1 at her 9:15 am 72 hour slot.  Pt can be added their by scheduling team or I will schedule accordingly, when she returns a call back.

## 2020-03-10 NOTE — Telephone Encounter (Signed)
Left a message for the pt to call back.  Noted in the computer while leaving the pt a message, she did call the office this morning and spoke with our operator, who then assisted her in making an appt with Truitt Merle NP for 9/1 at 0915.  Left a detailed message for the pt to call back, if she needs any further assistance from me.

## 2020-03-11 NOTE — Progress Notes (Signed)
CARDIOLOGY OFFICE NOTE  Date:  03/15/2020    Michelle Kane Date of Birth: 1985/11/25 Medical Record #[id number]  PCP:  Jearld Fenton, NP  Cardiologist:  Meda Coffee    Chief Complaint  Patient presents with  . Chest Pain    History of Present Illness: Michelle Kane is a 34 y.o. female who presents today for a work in visit. Seen for Dr. Meda Coffee.   She previously worked at [employer] in [place].   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 Other issues include sinus tachycardia (but stopped Metoprolol in the past), GERD and asthma. Long history of chest pain with prior CTA in 2018 with no evidence of CAD.   Last seen by Dr. Meda Coffee in January of 2020. Lots of stress with child with autism. Working 2 jobs. Parents with health issues as well. Using Lasix prn.   Phone call last week - "Pt would like to set up appt with Dr. Meda Coffee, she said she's been having chest discomfort for 2 weeks now, gave APP first available on 09/24 she said she can't wait that long and ask for RN Karlene Einstein to call her back."  Thus added to my schedule for today.   Comes in today. Here alone. Her children's father recently 3 months died in a car wreck. She is not working now. 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 She has a "weird" feeling in her chest - hard to describe -  that affects her left arm - off and on - just random - no pattern. Not exertional. Mostly at rest. Can feel the onset. Will last for about a minute or so and then she will feel a numbness will last for a few minutes. It will just fade away. She has had hysterectomy and is always sweaty and hot. She has had more heartburn despite being on chronic PPI therapy. Taking lots of Tums and Pepcid.  Not really eating out. No caffeine. No palpitations and her HR has not been high. She notes lots of stress and worries a lot. She tends to stop eating when she is stressed out. 2  children dealing with a deceased father and another is autistic. She does swim some and tries to stay active. She has not been vaccinated. She is not short of breath. She has no swelling. Her weight continues to climb.   Past Medical History:  Diagnosis Date  . Allergy-induced asthma    Allergy induced asthma  . Anxiety   . Bronchitis   . 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 Chronic systolic CHF (congestive heart failure) ([place])   . Essential hypertension    Taken for EF function and tachycardia  . GERD (gastroesophageal reflux disease)   . Headache    migraines  . 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 History of colon polyps   . History of ovarian cyst   . Pelvic hematoma, female   . Pelvic pain in female   . Peripartum cardiomyopathy    a. EF 40-45% in 2016, varying by several echoes. F/u echo 06/2015 showed EF 50%.   Marland Kitchen PONV (postoperative nausea and vomiting)   . Sinus tachycardia    a. 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 b. 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    Past Surgical History:  Procedure Laterality  Date  . ABDOMINAL HYSTERECTOMY    . CESAREAN SECTION  03-01-2008  and 2016  . IM PINNING RIGHT ELBOW FX  1992   HARDWARE REMOVED  . LAPAROSCOPIC ASSISTED VAGINAL HYSTERECTOMY Left 02/04/2017   Procedure: LAPAROSCOPIC ASSISTED VAGINAL HYSTERECTOMY, LEFT SALPINGOOPHORECTOMY;  Surgeon: Dian Queen, MD;  Location: [facility];  Service: Gynecology;  Laterality: Left;  NEED BED  . LAPAROSCOPY N/A 07/29/2014   Procedure: LAPAROSCOPY DIAGNOSTIC;  Surgeon: Cyril Mourning, MD;  Location: [facility];  Service: Gynecology;  Laterality: N/A;  . OOPHORECTOMY Left 02/04/2017   Procedure: LEFT OOPHORECTOMY;  Surgeon: Dian Queen, MD;  Location: [facility];  Service: Gynecology;  Laterality: Left;     Medications: Current Meds  Medication Sig  .  albuterol (VENTOLIN HFA) 108 (90 Base) MCG/ACT inhaler Inhale 2 puffs into the lungs every 6 (six) hours as needed for wheezing or shortness of breath.  Marland Kitchen albuterol (VENTOLIN HFA) 108 (90 Base) MCG/ACT inhaler Inhale 2 puffs into the lungs every 4 (four) hours as needed for wheezing or shortness of breath.  Marland Kitchen amoxicillin-clavulanate (AUGMENTIN) 875-125 MG tablet Take 1 tablet by mouth 2 (two) times daily.  Marland Kitchen azelastine (ASTELIN) 0.1 % nasal spray Place 1-2 sprays into both nostrils 2 (two) times daily as needed.  . budesonide-formoterol (SYMBICORT) 160-4.5 MCG/ACT inhaler 2 puffs twice daily with spacer to prevent coughing or wheezing. Rinse,gargle and spit after use.  . fexofenadine (ALLEGRA) 60 MG tablet Take 60 mg by mouth 2 (two) times daily.  Marland Kitchen FLUoxetine (PROZAC) 40 MG capsule Take 40 mg by mouth daily.  . Fluticasone Propionate (XHANCE) 93 MCG/ACT EXHU 2 sprays per nostril twice a day if needed for stuffy nose.  Marland Kitchen LORazepam (ATIVAN) 1 MG tablet Take 1 mg by mouth every 6 (six) hours as needed for anxiety.  . montelukast (SINGULAIR) 10 MG tablet Take 10 mg by mouth daily.  . Olopatadine HCl (PATADAY) 0.2 % SOLN 1 drop each eye as needed for itchy eyes.  . pantoprazole (PROTONIX) 40 MG tablet Take 1 tablet (40 mg total) by mouth daily. Take 30-60 min before first meal of the day     Allergies: Allergies  Allergen Reactions  . Vicodin [Hydrocodone-Acetaminophen] Itching, Nausea And Vomiting and Other (See Comments)    GI pains also  . Labetalol     Causes congestive heart failure  . Zyrtec [Cetirizine]     Worsening anxiety    Social History: The patient  reports that she quit smoking about 5 years ago. Her smoking use included cigarettes. She has a 5.00 pack-year smoking history. She has never used smokeless tobacco. She reports current alcohol use. She reports that she does not use drugs.   Family History: The patient's family history includes Allergic rhinitis in her daughter,  mother, sister, and son; Asthma in her mother and niece; COPD in her father and maternal grandmother; Colon polyps in her mother; Crohn's disease in her mother; Diabetes in her paternal grandfather; Food Allergy in her son; Heart attack in her maternal grandfather; Heart failure in her brother, paternal aunt, and paternal uncle; Hemophilia in her father and son; Hyperlipidemia in her father; Lung cancer in her maternal grandmother; Stroke in her paternal grandfather; Urticaria in her father.   Review of Systems: Please see the history of present illness.   All other systems are reviewed and negative.   Physical Exam: VS:  BP 120/90   Pulse 84   Ht 5\' 7"  (1.702 m)  Wt 289 lb (131.1 kg)   LMP 12/09/2016   SpO2 98%   BMI 45.26 kg/m  .  BMI Body mass index is 45.26 kg/m.  Wt Readings from Last 3 Encounters:  03/15/20 289 lb (131.1 kg)  01/28/20 287 lb (130.2 kg)  12/02/19 287 lb (130.2 kg)    General: Alert and in no acute distress.   Neck: Supple, no JVD, carotid bruits, or masses noted.  Cardiac: Regular rate and rhythm. No murmurs, rubs, or gallops. No edema.  Respiratory:  Lungs are clear to auscultation bilaterally with normal work of breathing.  GI: Soft and nontender.  MS: No deformity or atrophy. Gait and ROM intact.  Skin: Warm and dry. Color is normal.  Neuro:  Strength and sensation are intact and no gross focal deficits noted.  Psych: Alert, appropriate and with normal affect.   LABORATORY DATA:  EKG:  EKG is ordered today.  Personally reviewed by me. This demonstrates NSR with no acute changes.  Lab Results  Component Value Date   WBC 11.7 (H) 07/14/2018   HGB 14.4 07/14/2018   HCT 44.1 07/14/2018   PLT 255 07/14/2018   GLUCOSE 91 07/14/2018   CHOL 190 12/19/2015   TRIG 194.0 (H) 12/19/2015   HDL 36.20 (L) 12/19/2015   LDLCALC 115 (H) 12/19/2015   ALT 22 07/14/2018   AST 16 07/14/2018   NA 135 07/14/2018   K 3.6 07/14/2018   CL 103 07/14/2018    CREATININE 0.63 07/14/2018   BUN 11 07/14/2018   CO2 24 07/14/2018   TSH 1.010 12/31/2017   HGBA1C 5.1 01/12/2013     BNP (last 3 results) No results for input(s): BNP in the last 8760 hours.  ProBNP (last 3 results) No results for input(s): PROBNP in the last 8760 hours.   Other Studies Reviewed Today:  Echo Study Conclusions 01/2018  - Left ventricle: The cavity size was normal. Wall thickness was  normal. Systolic function was normal. The estimated ejection  fraction was in the range of 50% to 55%. Left ventricular  diastolic function parameters were normal.  - Left atrium: The atrium was mildly dilated.  - Atrial septum: No defect or patent foramen ovale was identified.     CT 08/2016 IMPRESSION: 1. Coronary calcium score of 0. This was 0 percentile for age and sex matched control.  2. Normal coronary origin with right dominance.  3. No evidence of CAD.      ASSESSMENT:    1. Atypical chest pain - last coronary CT was over 3 years ago - has multiple risk factors - will get this updated.   2. NICM - with prior pregnancy induced CM - last EF was normal. She does not sound like or look like this is volume related.   3. HTN - BP only fair. On no therapy.   4. GERD - on multiple agents.   5. Sinus tachycardia - not noted today.   6. FH + for CAD  7. HLD - no recent lipids noted - will get her labs updated.   8. Obesity - discussed at length.   9. COVID 19 - recommended vaccination.   10. Significant situational stress - I think this is driving lots of her issues. Hard to make herself a priority with all that she has going on.   Current medicines are reviewed with the patient today.  The patient does not have concerns regarding medicines other than what has been noted above.  The following changes  have been made:  See above.  Labs/ tests ordered today include:    Orders Placed This Encounter  Procedures  . EKG 12-Lead     Disposition:    Further disposition pending.   Patient is agreeable to this plan and will call if any problems develop in the interim.   SignedTruitt Merle, NP  03/15/2020 9:22 AM  [place] [address] [place], [place]  [address] Phone: [phone] Fax: [phone]

## 2020-03-15 ENCOUNTER — Other Ambulatory Visit: Payer: Self-pay

## 2020-03-15 ENCOUNTER — Encounter: Payer: Self-pay | Admitting: Nurse Practitioner

## 2020-03-15 ENCOUNTER — Ambulatory Visit (INDEPENDENT_AMBULATORY_CARE_PROVIDER_SITE_OTHER): Payer: Managed Care, Other (non HMO) | Admitting: Nurse Practitioner

## 2020-03-15 VITALS — BP 120/90 | HR 84 | Ht 67.0 in | Wt 289.0 lb

## 2020-03-15 DIAGNOSIS — Z8249 Family history of ischemic heart disease and other diseases of the circulatory system: Secondary | ICD-10-CM

## 2020-03-15 DIAGNOSIS — R079 Chest pain, unspecified: Secondary | ICD-10-CM | POA: Diagnosis not present

## 2020-03-15 DIAGNOSIS — I259 Chronic ischemic heart disease, unspecified: Secondary | ICD-10-CM | POA: Diagnosis not present

## 2020-03-15 DIAGNOSIS — I10 Essential (primary) hypertension: Secondary | ICD-10-CM | POA: Diagnosis not present

## 2020-03-15 DIAGNOSIS — I420 Dilated cardiomyopathy: Secondary | ICD-10-CM

## 2020-03-15 LAB — CBC
Hematocrit: 42.4 % (ref 34.0–46.6)
Hemoglobin: 13.9 g/dL (ref 11.1–15.9)
MCH: 30 pg (ref 26.6–33.0)
MCHC: 32.8 g/dL (ref 31.5–35.7)
MCV: 91 fL (ref 79–97)
Platelets: 261 10*3/uL (ref 150–450)
RBC: 4.64 x10E6/uL (ref 3.77–5.28)
RDW: 11.8 % (ref 11.7–15.4)
WBC: 8.1 10*3/uL (ref 3.4–10.8)

## 2020-03-15 LAB — LIPID PANEL
Chol/HDL Ratio: 6.5 ratio — ABNORMAL HIGH (ref 0.0–4.4)
Cholesterol, Total: 216 mg/dL — ABNORMAL HIGH (ref 100–199)
HDL: 33 mg/dL — ABNORMAL LOW (ref >39)
LDL Chol Calc (NIH): 137 mg/dL — ABNORMAL HIGH (ref 0–99)
Triglycerides: 255 mg/dL — ABNORMAL HIGH (ref 0–149)
VLDL Cholesterol Cal: 46 mg/dL — ABNORMAL HIGH (ref 5–40)

## 2020-03-15 LAB — HEPATIC FUNCTION PANEL
ALT: 18 IU/L (ref 0–32)
AST: 15 IU/L (ref 0–40)
Albumin: 4.1 g/dL (ref 3.8–4.8)
Alkaline Phosphatase: 68 IU/L (ref 48–121)
Bilirubin Total: 0.3 mg/dL (ref 0.0–1.2)
Bilirubin, Direct: 0.08 mg/dL (ref 0.00–0.40)
Total Protein: 6.8 g/dL (ref 6.0–8.5)

## 2020-03-15 LAB — BASIC METABOLIC PANEL
BUN/Creatinine Ratio: 13 (ref 9–23)
BUN: 9 mg/dL (ref 6–20)
CO2: 23 mmol/L (ref 20–29)
Calcium: 9.3 mg/dL (ref 8.7–10.2)
Chloride: 103 mmol/L (ref 96–106)
Creatinine, Ser: 0.67 mg/dL (ref 0.57–1.00)
GFR calc Af Amer: 133 mL/min/{1.73_m2} (ref >59)
GFR calc non Af Amer: 115 mL/min/{1.73_m2} (ref >59)
Glucose: 91 mg/dL (ref 65–99)
Potassium: 4.3 mmol/L (ref 3.5–5.2)
Sodium: 139 mmol/L (ref 134–144)

## 2020-03-15 LAB — TSH: TSH: 0.869 u[IU]/mL (ref 0.450–4.500)

## 2020-03-15 MED ORDER — METOPROLOL TARTRATE 100 MG PO TABS: 100.0000 mg | ORAL_TABLET | ORAL | 0 refills | Status: DC

## 2020-03-15 NOTE — Patient Instructions (Addendum)
After Visit Summary:  We will be checking the following labs today - BMET, CBC, HPF, Lipids, and TSH   Medication Instructions:    Continue with your current medicines.    If you need a refill on your cardiac medications before your next appointment, please call your pharmacy.     Testing/Procedures To Be Arranged:  Cardiac CT  Follow-Up:   Lets see how your studies turn out.     At [facility], you and your health needs are our priority.  As part of our continuing mission to provide you with exceptional heart care, we have created designated Provider Care Teams.  These Care Teams include your primary Cardiologist (physician) and Advanced Practice Providers (APPs -  Physician Assistants and Nurse Practitioners) who all work together to provide you with the care you need, when you need it.  Special Instructions:  . Stay safe, wash your hands for at least 20 seconds and wear a mask when needed.  . It was good to talk with you today.     Your cardiac CT will be scheduled at one of the below locations:   [facility] [address] [place], [place] [address] [phone]   If scheduled at [facility], please arrive at the [facility] main entrance of [facility] 30 minutes prior to test start time. Proceed to the [facility] Radiology Department (first floor) to check-in and test prep.   Please follow these instructions carefully (unless otherwise directed):    On the Night Before the Test: . Be sure to Drink plenty of water. . Do not consume any caffeinated/decaffeinated beverages or chocolate 12 hours prior to your test. . Do not take any antihistamines 12 hours prior to your test.   On the Day of the Test: . Drink plenty of water. Do not drink any water within one hour of the test. . Do not eat any food 4 hours prior to the test. . You may take your regular medications prior to the test.  . Take metoprolol (Lopressor) 100 mg x 1  dose two hours prior to test.  . FEMALES- please wear underwire-free bra if available      After the Test: . Drink plenty of water. . After receiving IV contrast, you may experience a mild flushed feeling. This is normal. . On occasion, you may experience a mild rash up to 24 hours after the test. This is not dangerous. If this occurs, you can take Benadryl 25 mg and increase your fluid intake. . If you experience trouble breathing, this can be serious. If it is severe call 911 IMMEDIATELY. If it is mild, please call our office.    Once we have confirmed authorization from your insurance company, we will call you to set up a date and time for your test. Based on how quickly your insurance processes prior authorizations requests, please allow up to 4 weeks to be contacted for scheduling your Cardiac CT appointment. Be advised that routine Cardiac CT appointments could be scheduled as many as 8 weeks after your provider has ordered it.  For non-scheduling related questions, please contact the cardiac imaging nurse navigator should you have any questions/concerns: Marchia Bond, Cardiac Imaging Nurse Navigator Burley Saver, Interim Cardiac Imaging Nurse [facility] [facility] Direct Office Dial: [phone]   For scheduling needs, including cancellations and rescheduling, please call Vivien Rota at [phone], option 3.      Call the [facility] office at [phone] if you have any questions, problems or concerns.

## 2020-03-17 ENCOUNTER — Other Ambulatory Visit: Payer: Self-pay | Admitting: *Deleted

## 2020-03-17 DIAGNOSIS — Z8249 Family history of ischemic heart disease and other diseases of the circulatory system: Secondary | ICD-10-CM

## 2020-03-17 MED ORDER — ATORVASTATIN CALCIUM 10 MG PO TABS: 10.0000 mg | ORAL_TABLET | Freq: Every day | ORAL | 11 refills | Status: DC

## 2020-04-04 ENCOUNTER — Ambulatory Visit: Payer: Managed Care, Other (non HMO) | Admitting: Allergy and Immunology

## 2020-04-14 ENCOUNTER — Telehealth (HOSPITAL_COMMUNITY): Payer: Self-pay | Admitting: Emergency Medicine

## 2020-04-14 NOTE — Telephone Encounter (Signed)
Attempted to call patient regarding upcoming cardiac CT appointment. °Left message on voicemail with name and callback number °Franziska Podgurski RN Navigator Cardiac Imaging °[facility] °[phone] Office °[phone] Cell ° °

## 2020-04-17 ENCOUNTER — Ambulatory Visit (HOSPITAL_COMMUNITY): Payer: Managed Care, Other (non HMO)

## 2020-04-25 ENCOUNTER — Other Ambulatory Visit: Payer: Managed Care, Other (non HMO)

## 2020-06-13 DIAGNOSIS — Z20828 Contact with and (suspected) exposure to other viral communicable diseases: Secondary | ICD-10-CM | POA: Diagnosis not present

## 2020-06-13 DIAGNOSIS — R197 Diarrhea, unspecified: Secondary | ICD-10-CM | POA: Diagnosis not present

## 2020-06-13 DIAGNOSIS — R110 Nausea: Secondary | ICD-10-CM | POA: Diagnosis not present

## 2020-08-21 ENCOUNTER — Ambulatory Visit (INDEPENDENT_AMBULATORY_CARE_PROVIDER_SITE_OTHER)
Admission: RE | Admit: 2020-08-21 | Discharge: 2020-08-21 | Disposition: A | Discharge status: Home / Self Care | Payer: Managed Care, Other (non HMO) | Source: Ambulatory Visit | Attending: Internal Medicine | Admitting: Internal Medicine

## 2020-08-21 ENCOUNTER — Encounter: Payer: Self-pay | Admitting: Internal Medicine

## 2020-08-21 ENCOUNTER — Ambulatory Visit (INDEPENDENT_AMBULATORY_CARE_PROVIDER_SITE_OTHER): Payer: Managed Care, Other (non HMO) | Admitting: Internal Medicine

## 2020-08-21 ENCOUNTER — Other Ambulatory Visit: Payer: Self-pay

## 2020-08-21 VITALS — BP 126/90 | HR 93 | Temp 97.7°F | Ht 66.75 in | Wt 305.0 lb

## 2020-08-21 DIAGNOSIS — M545 Low back pain, unspecified: Secondary | ICD-10-CM

## 2020-08-21 DIAGNOSIS — F411 Generalized anxiety disorder: Secondary | ICD-10-CM

## 2020-08-21 DIAGNOSIS — G8929 Other chronic pain: Secondary | ICD-10-CM

## 2020-08-21 DIAGNOSIS — J4540 Moderate persistent asthma, uncomplicated: Secondary | ICD-10-CM | POA: Diagnosis not present

## 2020-08-21 DIAGNOSIS — Z0001 Encounter for general adult medical examination with abnormal findings: Secondary | ICD-10-CM | POA: Diagnosis not present

## 2020-08-21 DIAGNOSIS — I10 Essential (primary) hypertension: Secondary | ICD-10-CM

## 2020-08-21 DIAGNOSIS — Z114 Encounter for screening for human immunodeficiency virus [HIV]: Secondary | ICD-10-CM

## 2020-08-21 DIAGNOSIS — K219 Gastro-esophageal reflux disease without esophagitis: Secondary | ICD-10-CM | POA: Diagnosis not present

## 2020-08-21 DIAGNOSIS — I5022 Chronic systolic (congestive) heart failure: Secondary | ICD-10-CM

## 2020-08-21 DIAGNOSIS — G43C0 Periodic headache syndromes in child or adult, not intractable: Secondary | ICD-10-CM | POA: Diagnosis not present

## 2020-08-21 DIAGNOSIS — E7800 Pure hypercholesterolemia, unspecified: Secondary | ICD-10-CM | POA: Diagnosis not present

## 2020-08-21 DIAGNOSIS — I420 Dilated cardiomyopathy: Secondary | ICD-10-CM | POA: Diagnosis not present

## 2020-08-21 DIAGNOSIS — Z1159 Encounter for screening for other viral diseases: Secondary | ICD-10-CM

## 2020-08-21 LAB — CBC
HCT: 39.6 % (ref 36.0–46.0)
Hemoglobin: 13.5 g/dL (ref 12.0–15.0)
MCHC: 34 g/dL (ref 30.0–36.0)
MCV: 90.2 fl (ref 78.0–100.0)
Platelets: 223 10*3/uL (ref 150.0–400.0)
RBC: 4.39 Mil/uL (ref 3.87–5.11)
RDW: 13.7 % (ref 11.5–15.5)
WBC: 7 10*3/uL (ref 4.0–10.5)

## 2020-08-21 LAB — COMPREHENSIVE METABOLIC PANEL
ALT: 19 U/L (ref 0–35)
AST: 15 U/L (ref 0–37)
Albumin: 4 g/dL (ref 3.5–5.2)
Alkaline Phosphatase: 61 U/L (ref 39–117)
BUN: 9 mg/dL (ref 6–23)
CO2: 27 mEq/L (ref 19–32)
Calcium: 9.3 mg/dL (ref 8.4–10.5)
Chloride: 104 mEq/L (ref 96–112)
Creatinine, Ser: 0.63 mg/dL (ref 0.40–1.20)
GFR: 115.7 mL/min (ref >60.00)
Glucose, Bld: 87 mg/dL (ref 70–99)
Potassium: 3.9 mEq/L (ref 3.5–5.1)
Sodium: 138 mEq/L (ref 135–145)
Total Bilirubin: 0.6 mg/dL (ref 0.2–1.2)
Total Protein: 6.7 g/dL (ref 6.0–8.3)

## 2020-08-21 LAB — LIPID PANEL
Cholesterol: 202 mg/dL — ABNORMAL HIGH (ref 0–200)
HDL: 37.1 mg/dL — ABNORMAL LOW (ref >39.00)
LDL Cholesterol: 140 mg/dL — ABNORMAL HIGH (ref 0–99)
NonHDL: 165.3
Total CHOL/HDL Ratio: 5
Triglycerides: 126 mg/dL (ref 0.0–149.0)
VLDL: 25.2 mg/dL (ref 0.0–40.0)

## 2020-08-21 LAB — TSH: TSH: 0.89 u[IU]/mL (ref 0.35–4.50)

## 2020-08-21 LAB — HEMOGLOBIN A1C: Hgb A1c MFr Bld: 5 % (ref 4.6–6.5)

## 2020-08-21 MED ORDER — PANTOPRAZOLE SODIUM 40 MG PO TBEC: 40.0000 mg | DELAYED_RELEASE_TABLET | Freq: Every day | ORAL | 3 refills | Status: DC

## 2020-08-21 NOTE — Assessment & Plan Note (Signed)
Currently on Lorazepam, managed by psychiatry We will follow

## 2020-08-21 NOTE — Assessment & Plan Note (Signed)
Allergy induced Managed on Symbicort, Astelin, Allegra, XHANCE, Pataday and Albuterol She will continue to follow with allergist

## 2020-08-21 NOTE — Assessment & Plan Note (Signed)
Avoid foods that trigger reflux Weight loss can help improve reflux symptoms Pantoprazole refilled today CBC and c-Met today

## 2020-08-21 NOTE — Assessment & Plan Note (Signed)
Compensated, off meds Encouraged DASH diet, monitoring daily weights

## 2020-08-21 NOTE — Patient Instructions (Signed)
Health Maintenance, Female Adopting a healthy lifestyle and getting preventive care are important in promoting health and wellness. Ask your health care provider about:  The right schedule for you to have regular tests and exams.  Things you can do on your own to prevent diseases and keep yourself healthy. What should I know about diet, weight, and exercise? Eat a healthy diet  Eat a diet that includes plenty of vegetables, fruits, low-fat dairy products, and lean protein.  Do not eat a lot of foods that are high in solid fats, added sugars, or sodium.   Maintain a healthy weight Body mass index (BMI) is used to identify weight problems. It estimates body fat based on height and weight. Your health care provider can help determine your BMI and help you achieve or maintain a healthy weight. Get regular exercise Get regular exercise. This is one of the most important things you can do for your health. Most adults should:  Exercise for at least 150 minutes each week. The exercise should increase your heart rate and make you sweat (moderate-intensity exercise).  Do strengthening exercises at least twice a week. This is in addition to the moderate-intensity exercise.  Spend less time sitting. Even light physical activity can be beneficial. Watch cholesterol and blood lipids Have your blood tested for lipids and cholesterol at 35 years of age, then have this test every 5 years. Have your cholesterol levels checked more often if:  Your lipid or cholesterol levels are high.  You are older than 35 years of age.  You are at high risk for heart disease. What should I know about cancer screening? Depending on your health history and family history, you may need to have cancer screening at various ages. This may include screening for:  Breast cancer.  Cervical cancer.  Colorectal cancer.  Skin cancer.  Lung cancer. What should I know about heart disease, diabetes, and high blood  pressure? Blood pressure and heart disease  High blood pressure causes heart disease and increases the risk of stroke. This is more likely to develop in people who have high blood pressure readings, are of African descent, or are overweight.  Have your blood pressure checked: ? Every 3-5 years if you are 18-39 years of age. ? Every year if you are 40 years old or older. Diabetes Have regular diabetes screenings. This checks your fasting blood sugar level. Have the screening done:  Once every three years after age 40 if you are at a normal weight and have a low risk for diabetes.  More often and at a younger age if you are overweight or have a high risk for diabetes. What should I know about preventing infection? Hepatitis B If you have a higher risk for hepatitis B, you should be screened for this virus. Talk with your health care provider to find out if you are at risk for hepatitis B infection. Hepatitis C Testing is recommended for:  Everyone born from 1945 through 1965.  Anyone with known risk factors for hepatitis C. Sexually transmitted infections (STIs)  Get screened for STIs, including gonorrhea and chlamydia, if: ? You are sexually active and are younger than 35 years of age. ? You are older than 35 years of age and your health care provider tells you that you are at risk for this type of infection. ? Your sexual activity has changed since you were last screened, and you are at increased risk for chlamydia or gonorrhea. Ask your health care provider   if you are at risk.  Ask your health care provider about whether you are at high risk for HIV. Your health care provider may recommend a prescription medicine to help prevent HIV infection. If you choose to take medicine to prevent HIV, you should first get tested for HIV. You should then be tested every 3 months for as long as you are taking the medicine. Pregnancy  If you are about to stop having your period (premenopausal) and  you may become pregnant, seek counseling before you get pregnant.  Take 400 to 800 micrograms (mcg) of folic acid every day if you become pregnant.  Ask for birth control (contraception) if you want to prevent pregnancy. Osteoporosis and menopause Osteoporosis is a disease in which the bones lose minerals and strength with aging. This can result in bone fractures. If you are 65 years old or older, or if you are at risk for osteoporosis and fractures, ask your health care provider if you should:  Be screened for bone loss.  Take a calcium or vitamin D supplement to lower your risk of fractures.  Be given hormone replacement therapy (HRT) to treat symptoms of menopause. Follow these instructions at home: Lifestyle  Do not use any products that contain nicotine or tobacco, such as cigarettes, e-cigarettes, and chewing tobacco. If you need help quitting, ask your health care provider.  Do not use street drugs.  Do not share needles.  Ask your health care provider for help if you need support or information about quitting drugs. Alcohol use  Do not drink alcohol if: ? Your health care provider tells you not to drink. ? You are pregnant, may be pregnant, or are planning to become pregnant.  If you drink alcohol: ? Limit how much you use to 0-1 drink a day. ? Limit intake if you are breastfeeding.  Be aware of how much alcohol is in your drink. In the [place] one drink equals one 12 oz bottle of beer (355 mL), one 5 oz glass of wine (148 mL), or one 1 oz glass of hard liquor (44 mL). General instructions  Schedule regular health, dental, and eye exams.  Stay current with your vaccines.  Tell your health care provider if: ? You often feel depressed. ? You have ever been abused or do not feel safe at home. Summary  Adopting a healthy lifestyle and getting preventive care are important in promoting health and wellness.  Follow your health care provider's instructions about healthy  diet, exercising, and getting tested or screened for diseases.  Follow your health care provider's instructions on monitoring your cholesterol and blood pressure. This information is not intended to replace advice given to you by your health care provider. Make sure you discuss any questions you have with your health care provider. Document Revised: 06/24/2018 Document Reviewed: 06/24/2018 Elsevier Patient Education  2021 [employer] Inc.  

## 2020-08-21 NOTE — Assessment & Plan Note (Signed)
C-Met and lipid profile today Encouraged her to consume a low-fat diet 

## 2020-08-21 NOTE — Assessment & Plan Note (Signed)
Borderline Reinforced DASH diet and exercise for weight loss

## 2020-08-21 NOTE — Progress Notes (Signed)
Subjective:    Patient ID: Michelle Kane, female    DOB: Apr 23, 1986, 35 y.o.   MRN: [id number]  HPI  Patient presents the clinic today for her annual exam.  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  PFTs from 11/2019 reviewed. She follows with an allergist.  Anxiety: Managed on Lorazepam.  She follows with psychiatry. She is not seeing a therapist. She denies depression, SI/HI.  CHF, Systolic: Compensated, managed off meds.  Echo from 01/2018 reviewed.  GERD: Persistent, managed with Famotidine.  There is no upper GI on file.  Migraines: These occur a few times per week. She takes Ibuprofen as needed for breakthrough.  She does not follow with neurology.  HLD: She is not currently taking any cholesterol lowering medication at this time. She tries to consume a low fat diet.   She also notice a bulging area in her left lower back. She noticed this about a year ago but has worsened in the last 4 months. She describes the pain as sharp with associated numbness, but denies tingling. She denies any injury to the area. She denies numbness, tingling or weakness of her lower extremities. She denies loss of bowel or bladder control. She has taken OTC meds without any relief.   Flu: 04/2019 Tetanus: 01/2013 COVID: never Pap smear: 11/2016, hysterectomy Dentist: as needed  Diet: Exercise:  Review of Systems      Past Medical History:  Diagnosis Date  . Allergy-induced asthma    Allergy induced asthma  . Anxiety   . Bronchitis   . 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 Chronic systolic CHF (congestive heart failure) ([place])   . Essential hypertension    Taken for EF function and tachycardia  . GERD (gastroesophageal reflux disease)   . Headache    migraines  . 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 History of colon polyps   . History of ovarian cyst   . Pelvic hematoma, female   . Pelvic pain in female    . Peripartum cardiomyopathy    a. EF 40-45% in 2016, varying by several echoes. F/u echo 06/2015 showed EF 50%.   Marland Kitchen PONV (postoperative nausea and vomiting)   . Sinus tachycardia    a. 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 b. 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    Current Outpatient Medications  Medication Sig Dispense Refill  . albuterol (VENTOLIN HFA) 108 (90 Base) MCG/ACT inhaler Inhale 2 puffs into the lungs every 6 (six) hours as needed for wheezing or shortness of breath. 18 g 2  . albuterol (VENTOLIN HFA) 108 (90 Base) MCG/ACT inhaler Inhale 2 puffs into the lungs every 4 (four) hours as needed for wheezing or shortness of breath. 18 g 1  . azelastine (ASTELIN) 0.1 % nasal spray Place 1-2 sprays into both nostrils 2 (two) times daily as needed. 30 mL 5  . budesonide-formoterol (SYMBICORT) 160-4.5 MCG/ACT inhaler 2 puffs twice daily with spacer to prevent coughing or wheezing. Rinse,gargle and spit after use. 1 Inhaler 5  . fexofenadine (ALLEGRA) 60 MG tablet Take 60 mg by mouth 2 (two) times daily.    Marland Kitchen FLUoxetine (PROZAC) 40 MG capsule Take 40 mg by mouth daily.    . Fluticasone Propionate (XHANCE) 93 MCG/ACT EXHU 2 sprays per nostril twice a day if needed for stuffy nose. 32 mL 5  . LORazepam (ATIVAN) 1  MG tablet Take 1 mg by mouth every 6 (six) hours as needed for anxiety.    . montelukast (SINGULAIR) 10 MG tablet Take 10 mg by mouth daily.    . Olopatadine HCl (PATADAY) 0.2 % SOLN 1 drop each eye as needed for itchy eyes. 2.5 mL 5  . pantoprazole (PROTONIX) 40 MG tablet Take 1 tablet (40 mg total) by mouth daily. Take 30-60 min before first meal of the day 90 tablet 3  . atorvastatin (LIPITOR) 10 MG tablet Take 1 tablet (10 mg total) by mouth daily. 30 tablet 11  . metoprolol tartrate (LOPRESSOR) 100 MG tablet Take 1 tablet (100 mg total) by mouth as directed. To take 2 hours prior to coronary CT scan 1 tablet 0   No current  facility-administered medications for this visit.    Allergies  Allergen Reactions  . Vicodin [Hydrocodone-Acetaminophen] Itching, Nausea And Vomiting and Other (See Comments)    GI pains also  . Labetalol     Causes congestive heart failure  . Zyrtec [Cetirizine]     Worsening anxiety    Family History  Problem Relation Age of Onset  . Asthma Mother   . Crohn's disease Mother   . Colon polyps Mother   . Allergic rhinitis Mother   . Hyperlipidemia Father   . Hemophilia Father   . COPD Father        smoked  . Urticaria Father   . Hemophilia Son   . Allergic rhinitis Son   . Food Allergy Son   . Heart attack Maternal Grandfather   . Diabetes Paternal Grandfather   . Stroke Paternal Grandfather   . Heart failure Brother   . Heart failure Paternal Uncle   . Heart failure Paternal Aunt   . COPD Maternal Grandmother        smoked  . Lung cancer Maternal Grandmother        smoked  . Allergic rhinitis Sister   . Asthma Niece   . Allergic rhinitis Daughter   . Hypertension Neg Hx   . Eczema Neg Hx   . Angioedema Neg Hx   . Atopy Neg Hx   . Immunodeficiency Neg Hx     Social History   Socioeconomic History  . Marital status: Married    Spouse name: Not on file  . Number of children: Not on file  . Years of education: Not on file  . Highest education level: Not on file  Occupational History  . Occupation: CNA   Tobacco Use  . Smoking status: Former Smoker    Packs/day: 0.50    Years: 10.00    Pack years: 5.00    Types: Cigarettes    Quit date: 02/13/2015    Years since quitting: 5.5  . Smokeless tobacco: Never Used  Vaping Use  . Vaping Use: Never used  Substance and Sexual Activity  . Alcohol use: Yes    Alcohol/week: 0.0 standard drinks    Comment: social  . Drug use: No  . Sexual activity: Yes    Birth control/protection: Surgical  Other Topics Concern  . Not on file  Social History Narrative  . Not on file   Social Determinants of Health    Financial Resource Strain: Not on file  Food Insecurity: Not on file  Transportation Needs: Not on file  Physical Activity: Not on file  Stress: Not on file  Social Connections: Not on file  Intimate Partner Violence: Not on file     Constitutional:  Patient reports intermittent headaches, weight gain.  Denies fever, malaise, fatigue  HEENT: Denies eye pain, eye redness, ear pain, ringing in the ears, wax buildup, runny nose, nasal congestion, bloody nose, or sore throat. Respiratory: Denies difficulty breathing, shortness of breath, cough or sputum production.   Cardiovascular: Denies chest pain, chest tightness, palpitations or swelling in the hands or feet.  Gastrointestinal: Denies abdominal pain, bloating, constipation, diarrhea or blood in the stool.  GU: Denies urgency, frequency, pain with urination, burning sensation, blood in urine, odor or discharge. Musculoskeletal: Pt reports chronic low back pain. Denies decrease in range of motion, difficulty with gait, muscle pain or joint swelling.  Skin: Denies redness, rashes, lesions or ulcercations.  Neurological: Denies dizziness, difficulty with memory, difficulty with speech or problems with balance and coordination.  Psych: Patient has a history of anxiety.  Denies depression, SI/HI.  No other specific complaints in a complete review of systems (except as listed in HPI above).  Objective:   Physical Exam   BP 126/90   Pulse 93   Temp 97.7 F (36.5 C) (Temporal)   Ht 5' 6.75" (1.695 m)   Wt (!) 305 lb (138.3 kg)   LMP 12/09/2016   SpO2 97%   BMI 48.13 kg/m   Wt Readings from Last 3 Encounters:  03/15/20 289 lb (131.1 kg)  01/28/20 287 lb (130.2 kg)  12/02/19 287 lb (130.2 kg)    General: Appears her stated age, obese, in NAD. Skin: Warm, dry and intact. No rashes, lesions or ulcerations noted. HEENT: Head: normal shape and size; Eyes: sclera white, no icterus, conjunctiva pink, PERRLA and EOMs intact;  Neck:   Neck supple, trachea midline. No masses, lumps or thyromegaly present.  Cardiovascular: Normal rate and rhythm. S1,S2 noted.  No murmur, rubs or gallops noted. No JVD or BLE edema.  Pulmonary/Chest: Normal effort and positive vesicular breath sounds. No respiratory distress. No wheezes, rales or ronchi noted.  Abdomen: Soft and nontender. Normal bowel sounds. No distention or masses noted. Liver, spleen and kidneys non palpable. Musculoskeletal: Normal flexion, rotation of the spine. Pain with extension. Pain with palpation over the lumbar spine and left paraspinal muscles. Strength 5/5 BUE/BLE.No difficulty with gait.  Neurological: Alert and oriented. Cranial nerves II-XII grossly intact. Coordination normal.  Psychiatric: Mood and affect normal. Behavior is normal. Judgment and thought content normal.    BMET    Component Value Date/Time   NA 139 03/15/2020 0957   K 4.3 03/15/2020 0957   CL 103 03/15/2020 0957   CO2 23 03/15/2020 0957   GLUCOSE 91 03/15/2020 0957   GLUCOSE 91 07/14/2018 1701   BUN 9 03/15/2020 0957   CREATININE 0.67 03/15/2020 0957   CREATININE 0.69 03/14/2016 0940   CALCIUM 9.3 03/15/2020 0957   GFRNONAA 115 03/15/2020 0957   GFRAA 133 03/15/2020 0957    Lipid Panel     Component Value Date/Time   CHOL 216 (H) 03/15/2020 0957   TRIG 255 (H) 03/15/2020 0957   HDL 33 (L) 03/15/2020 0957   CHOLHDL 6.5 (H) 03/15/2020 0957   CHOLHDL 5 12/19/2015 0946   VLDL 38.8 12/19/2015 0946   LDLCALC 137 (H) 03/15/2020 0957    CBC    Component Value Date/Time   WBC 8.1 03/15/2020 0957   WBC 11.7 (H) 07/14/2018 1701   RBC 4.64 03/15/2020 0957   RBC 4.78 07/14/2018 1701   HGB 13.9 03/15/2020 0957   HCT 42.4 03/15/2020 0957   PLT 261 03/15/2020 0957  MCV 91 03/15/2020 0957   MCH 30.0 03/15/2020 0957   MCH 30.1 07/14/2018 1701   MCHC 32.8 03/15/2020 0957   MCHC 32.7 07/14/2018 1701   RDW 11.8 03/15/2020 0957   LYMPHSABS 3.2 07/19/2017 2330   MONOABS 0.6  07/19/2017 2330   EOSABS 0.4 07/19/2017 2330   BASOSABS 0.0 07/19/2017 2330    Hgb A1C Lab Results  Component Value Date   HGBA1C 5.1 01/12/2013           Assessment & Plan:   Preventative Health Maintenance:  She declines flu shot today Tetanus UTD She declines Covid vaccine She no longer needs pap smears Encouraged her to consume a balanced diet and exercise regimen Advised her to see an eye doctor and dentist annually  we will check CBC, c-Met, TSH, lipid, A1c, HIV and hep C today  Chronic Back Pain:  Xray lumbar spine today   RTC in 1 year, sooner if needed Webb Silversmith, NP This visit occurred during the SARS-CoV-2 public health emergency.  Safety protocols were in place, including screening questions prior to the visit, additional usage of staff PPE, and extensive cleaning of exam room while observing appropriate contact time as indicated for disinfecting solutions.

## 2020-08-21 NOTE — Assessment & Plan Note (Signed)
Persistent, not worse off beta-blocker Continue ibuprofen as needed

## 2020-08-22 ENCOUNTER — Encounter: Payer: Self-pay | Admitting: Internal Medicine

## 2020-08-22 DIAGNOSIS — M545 Low back pain, unspecified: Secondary | ICD-10-CM

## 2020-08-22 LAB — HEPATITIS C ANTIBODY
Hepatitis C Ab: NONREACTIVE
SIGNAL TO CUT-OFF: 0.01 (ref <1.00)

## 2020-08-22 LAB — HIV ANTIBODY (ROUTINE TESTING W REFLEX): HIV 1&2 Ab, 4th Generation: NONREACTIVE

## 2020-08-24 ENCOUNTER — Encounter: Payer: Self-pay | Admitting: Internal Medicine

## 2020-08-30 NOTE — Addendum Note (Signed)
Addended by: Lurlean Nanny on: 08/30/2020 12:30 PM   Modules accepted: Orders

## 2020-09-06 ENCOUNTER — Ambulatory Visit: Payer: Managed Care, Other (non HMO) | Attending: Internal Medicine | Admitting: Physical Therapy

## 2020-09-21 ENCOUNTER — Ambulatory Visit: Payer: Managed Care, Other (non HMO) | Attending: Internal Medicine | Admitting: Physical Therapy

## 2020-09-21 ENCOUNTER — Other Ambulatory Visit: Payer: Self-pay

## 2020-09-21 ENCOUNTER — Encounter: Payer: Self-pay | Admitting: Physical Therapy

## 2020-09-21 DIAGNOSIS — M6281 Muscle weakness (generalized): Secondary | ICD-10-CM | POA: Insufficient documentation

## 2020-09-21 DIAGNOSIS — M5442 Lumbago with sciatica, left side: Secondary | ICD-10-CM | POA: Insufficient documentation

## 2020-09-21 DIAGNOSIS — M62830 Muscle spasm of back: Secondary | ICD-10-CM | POA: Diagnosis not present

## 2020-09-21 DIAGNOSIS — G8929 Other chronic pain: Secondary | ICD-10-CM | POA: Diagnosis not present

## 2020-09-21 DIAGNOSIS — M5441 Lumbago with sciatica, right side: Secondary | ICD-10-CM | POA: Insufficient documentation

## 2020-09-21 NOTE — Patient Instructions (Signed)
    Access Code: [email or web address] URL: [email or web address] Date: 09/21/2020 Prepared by: Annie Paras  Exercises Hooklying Hamstring Stretch with Strap - 2-3 x daily - 7 x weekly - 3 reps - 30 sec hold Supine ITB Stretch with Strap - 2-3 x daily - 7 x weekly - 3 reps - 30 sec hold Supine Figure 4 Piriformis Stretch with Leg Extension - 2-3 x daily - 7 x weekly - 3 reps - 30 sec hold Supine Lower Trunk Rotation - 2-3 x daily - 7 x weekly - 5 reps - 10 sec hold

## 2020-09-21 NOTE — Therapy (Addendum)
[facility] [address]  [place], [place], [address] Phone: [phone]   Fax:  [phone]  Physical Therapy Evaluation  Patient Details  Name: Michelle Kane MRN: [id number] Date of Birth: 19-Aug-1985 Referring Provider (PT): Webb Silversmith, NP   Encounter Date: 09/21/2020   PT End of Session - 09/21/20 1612    Visit Number 1    Number of Visits 12    Date for PT Re-Evaluation 11/02/20    Authorization Type C[employer] & Medicaid Healthy Blue - no auth required    PT Start Time 1612    PT Stop Time 1701    PT Time Calculation (min) 49 min    Activity Tolerance Patient tolerated treatment well    Behavior During Therapy [facility] for tasks assessed/performed           Past Medical History:  Diagnosis Date  . Allergy-induced asthma    Allergy induced asthma  . Anxiety   . Bronchitis   . 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 Chronic systolic CHF (congestive heart failure) ([place])   . Essential hypertension    Taken for EF function and tachycardia  . GERD (gastroesophageal reflux disease)   . Headache    migraines  . 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 History of colon polyps   . History of ovarian cyst   . Pelvic hematoma, female   . Pelvic pain in female   . Peripartum cardiomyopathy    a. EF 40-45% in 2016, varying by several echoes. F/u echo 06/2015 showed EF 50%.   Marland Kitchen PONV (postoperative nausea and vomiting)   . Sinus tachycardia    a. 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 b. 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    Past Surgical History:  Procedure Laterality Date  . ABDOMINAL HYSTERECTOMY    . CESAREAN SECTION  03-01-2008  and 2016  . IM PINNING RIGHT ELBOW FX  1992   HARDWARE REMOVED  . LAPAROSCOPIC ASSISTED VAGINAL HYSTERECTOMY Left 02/04/2017   Procedure: LAPAROSCOPIC ASSISTED VAGINAL HYSTERECTOMY, LEFT SALPINGOOPHORECTOMY;   Surgeon: Dian Queen, MD;  Location: [place];  Service: Gynecology;  Laterality: Left;  NEED BED  . LAPAROSCOPY N/A 07/29/2014   Procedure: LAPAROSCOPY DIAGNOSTIC;  Surgeon: Cyril Mourning, MD;  Location: [facility];  Service: Gynecology;  Laterality: N/A;  . OOPHORECTOMY Left 02/04/2017   Procedure: LEFT OOPHORECTOMY;  Surgeon: Dian Queen, MD;  Location: [facility];  Service: Gynecology;  Laterality: Left;    There were no vitals filed for this visit.    Subjective Assessment - 09/21/20 1616    Subjective Pt reports onset of LBP ~8-9 months sgo w/o known MOI. She notes area of "buldging" in low back which seems to get worse as her pain worsens. Pain mostly L sided and she notes after a while the skin will go numb. Also notes pain in L>R calves and feet.    Limitations Sitting;Standing;Walking    How long can you sit comfortably? varies - readjust frequently    How long can you stand comfortably? 20-30 minutes    How long can you walk comfortably? 10-15 minutes    Diagnostic tests 08/21/20- lumbar x-ray: Moderate disc height loss at the L5-S1 level.    Patient Stated Goals "to be able to household stuff and things that I need to do w/o my  back killing me"    Currently in Pain? No/denies    Pain Score 0-No pain   7/10 on average, 10+/10 at worst   Pain Location Back    Pain Orientation Lower;Left    Pain Descriptors / Indicators Sharp;Stabbing;Burning    Pain Type Acute pain;Chronic pain    Pain Radiating Towards intermittent numbness and burning pain in lower legs and feet    Pain Onset --   6 months   Pain Frequency Intermittent    Aggravating Factors  going to the gorcery store, cleaning house, doing everyday stuff that needs to be done    Pain Relieving Factors sitting down    Effect of Pain on Daily Activities limits ability to take care of things around the house              [facility] PT Assessment - 09/21/20 1612       Assessment   Medical Diagnosis Acute/chronic L-sided LBP with B radiculopathy    Referring Provider (PT) Webb Silversmith, NP    Onset Date/Surgical Date --   ~8-9 months   Hand Dominance Right    Next MD Visit TBD upon completion of PT    Prior Therapy none      Precautions   Precautions None      Restrictions   Weight Bearing Restrictions No      Balance Screen   Has the patient fallen in the past 6 months No    Has the patient had a decrease in activity level because of a fear of falling?  No    Is the patient reluctant to leave their home because of a fear of falling?  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    Leisure doing things with her kids; gym 2-3x/wk for cardio (has not been recently d/t LBP)      Cognition   Overall Cognitive Status Within Functional Limits for tasks assessed      Observation/Other Assessments   Focus on Therapeutic Outcomes (FOTO)  Lumbar: FS=44, predicted FS=60      ROM / Strength   AROM / PROM / Strength AROM;Strength      AROM   AROM Assessment Site Lumbar    Lumbar Flexion hands to lower shins - tightness    Lumbar Extension 25% limited - pain in back    Lumbar - Right Side Bend hands to knee joint line   no pain/tightness - "just jellly roll got in the way"   Lumbar - Left Side Bend hands to knee joint line   no pain/tightness - "just jellly roll got in the way"   Lumbar - Right Rotation 25% limited - tight    Lumbar - Left Rotation [facility]      Strength   Strength Assessment Site Hip;Knee;Ankle    Right/Left Hip Right;Left    Right Hip Flexion  4+/5    Right Hip Extension 4/5    Right Hip External Rotation  4+/5    Right Hip Internal Rotation 5/5  Right Hip ABduction 4+/5    Right Hip ADduction 4+/5    Left Hip Flexion 4+/5    Left Hip Extension 4-/5    Left Hip External Rotation 4+/5    Left Hip Internal Rotation 5/5    Left Hip ABduction 4/5    Left Hip ADduction 4/5    Right/Left Knee Right;Left    Right Knee Flexion 5/5    Right Knee Extension 5/5    Left Knee Flexion 5/5    Left Knee Extension 5/5    Right/Left Ankle Right;Left    Right Ankle Dorsiflexion 4+/5    Right Ankle Plantar Flexion 4/5   11 SLS heel raises - limited by cramp in thigh   Left Ankle Dorsiflexion 4+/5    Left Ankle Plantar Flexion 3+/5   5 SLS heel raises - limited by cramp in thigh     Flexibility   Soft Tissue Assessment /Muscle Length yes    Hamstrings mild tight B    Quadriceps mod tight hip flexors on L, quads on R    ITB mod tight L>R    Piriformis mild/mod tight B    Quadratus Lumborum mod tight on L      Palpation   Spinal mobility 2/6 hypomobility in lumber spine with increased pain at L4 & L5    Palpation comment increased mucscle tension & ttp in L>R lumbar paraspinals & upper glutes                      Objective measurements completed on examination: See above findings.               PT Education - 09/21/20 1700    Education Details PT eval findings, anticipated POC & initial HEP - Access Code: [email or web address]    Person(s) Educated Patient    Methods Explanation;Demonstration;Verbal cues;Handout    Comprehension Verbalized understanding;Verbal cues required;Returned demonstration;Need further instruction            PT Short Term Goals - 09/21/20 1701      PT SHORT TERM GOAL #1   Title Patient will be independent with initial HEP    Status New    Target Date 10/12/20      PT SHORT TERM GOAL #2   Title Patient will verbalize/demonstrate understanding of neutral spine posture and proper body  mechanics to reduce strain on lumbar spine    Status New    Target Date 10/12/20             PT Long Term Goals - 09/21/20 1701      PT LONG TERM GOAL #1   Title Patient will be independent with ongoing/advanced HEP +/- gym program for self-management at home    Status New    Target Date 11/02/20      PT LONG TERM GOAL #2   Title Patient to demonstrate appropriate posture and body mechanics needed for daily activities    Status New    Target Date 11/02/20      PT LONG TERM GOAL #3   Title Decrease LBP pain by >/= 50% allowing patient increased ease of completion of daily household tasks    Status New    Target Date 11/02/20      PT LONG TERM GOAL #4   Title Patient to improve tissue quality as noted by reduced tissue tightness and tenderness to palpation    Status New    Target Date 11/02/20  PT LONG TERM GOAL #5   Title Patient to report ability to perform ADLs, household, and work-related tasks without increased pain    Status New    Target Date 11/02/20                  Plan - 09/21/20 1701    Clinical Impression Statement Michelle Kane is a 35 y/o female who presents to OP PT with chronic LBP with B lumbar radiculopathy originating ~8-9 months ago w/o known MOI. Pain limits all aspects of daily life including positional tolerance, household chores, caring for and playing with her children, as well as preventing her from working out. Current deficits include pain with B LE radiculopathy, increased muscle tension, mildly decreased lumbar AROM in all planes, limited proximal LE flexibility and L>R proximal LE weakness. Michelle Kane will benefit from skilled PT to address above deficits to reduce myofascial pain and tightness and improve core strength to restore normal mobility and allow for increased participation in desired activities.    Personal Factors and Comorbidities Time since onset of injury/illness/exacerbation;Past/Current Experience;Fitness;Comorbidity  3+;Profession    Comorbidities HTN, pregnancy induced CHF/peripartum cardiomyopathy, allergy induced asthma, migraines, anxiety, GERD, R elbow fx s/p IM pinning with subsequent hardware removal    Examination-Activity Limitations Hygiene/Grooming;Bathing;Bend;Caring for Others;Lift;Carry;Sit;Stand;Locomotion Level    Examination-Participation Restrictions Cleaning;Community Activity;Driving;Interpersonal Relationship;Laundry;Occupation;Shop    Stability/Clinical Decision Making Stable/Uncomplicated    Clinical Decision Making Low    Rehab Potential Good    PT Frequency 2x / week    PT Duration 6 weeks    PT Treatment/Interventions ADLs/Self Care Home Management;Cryotherapy;Electrical Stimulation;Iontophoresis 4mg /ml Dexamethasone;Moist Heat;Traction;Ultrasound;Functional mobility training;Therapeutic activities;Therapeutic exercise;Neuromuscular re-education;Patient/family education;Manual techniques;Passive range of motion;Dry needling;Taping;Spinal Manipulations    PT Next Visit Plan Review initial HEP & progress lumbopelvic flexibility; posture & body mechanics education; initiate core/lumbopelvic strengthening; manual therapy & modalities PRN for pain    PT Home Exercise Plan MedBridge Access Code: [email or web address] (3/10)    Consulted and Agree with Plan of Care Patient           Patient will benefit from skilled therapeutic intervention in order to improve the following deficits and impairments:  Decreased activity tolerance,Decreased knowledge of precautions,Decreased mobility,Decreased range of motion,Decreased strength,Difficulty walking,Hypomobility,Increased fascial restricitons,Increased muscle spasms,Impaired perceived functional ability,Impaired flexibility,Impaired sensation,Improper body mechanics,Postural dysfunction,Pain,Obesity  Visit Diagnosis: Chronic left-sided low back pain with bilateral sciatica - Plan: PT plan of care cert/re-cert  Muscle spasm of back - Plan: PT plan of care  cert/re-cert  Muscle weakness (generalized) - Plan: PT plan of care cert/re-cert     Problem List Patient Active Problem List   Diagnosis Date Noted  . Pure hypercholesterolemia 08/21/2020  . Other allergic rhinitis 12/02/2019  . Migraines 09/15/2017  . Moderate persistent asthma 06/27/2016  . Anxiety state 12/19/2015  . Essential hypertension 10/09/2015  . CHF (congestive heart failure), NYHA class I, chronic, systolic ([place]) 16/04/9603  . GERD (gastroesophageal reflux disease)     Percival Spanish, PT, MPT 09/21/2020, 7:34 PM  [facility] [address]  [place], [place], [address] Phone: [phone]   Fax:  [phone]  Name: Michelle Kane MRN: [id number] Date of Birth: Jul 13, 1986

## 2020-09-25 ENCOUNTER — Ambulatory Visit: Payer: Managed Care, Other (non HMO)

## 2020-09-28 ENCOUNTER — Ambulatory Visit: Payer: Managed Care, Other (non HMO) | Admitting: Physical Therapy

## 2020-10-04 ENCOUNTER — Ambulatory Visit: Payer: Managed Care, Other (non HMO) | Admitting: Physical Therapy

## 2020-10-11 ENCOUNTER — Other Ambulatory Visit: Payer: Self-pay

## 2020-10-11 ENCOUNTER — Ambulatory Visit: Payer: Managed Care, Other (non HMO) | Admitting: Physical Therapy

## 2020-10-11 ENCOUNTER — Encounter: Payer: Self-pay | Admitting: Physical Therapy

## 2020-10-11 DIAGNOSIS — G8929 Other chronic pain: Secondary | ICD-10-CM

## 2020-10-11 DIAGNOSIS — M62830 Muscle spasm of back: Secondary | ICD-10-CM

## 2020-10-11 DIAGNOSIS — M5442 Lumbago with sciatica, left side: Secondary | ICD-10-CM | POA: Diagnosis not present

## 2020-10-11 DIAGNOSIS — M6281 Muscle weakness (generalized): Secondary | ICD-10-CM

## 2020-10-11 DIAGNOSIS — M5441 Lumbago with sciatica, right side: Secondary | ICD-10-CM | POA: Diagnosis not present

## 2020-10-11 NOTE — Therapy (Signed)
[facility] [address]  [place], [place], [address] Phone: [phone]   Fax:  [phone]  Physical Therapy Treatment  Patient Details  Name: Michelle Kane MRN: [id number] Date of Birth: [birth year] Referring Provider (PT): Webb Silversmith, NP   Encounter Date: 10/11/2020   PT End of Session - 10/11/20 1150    Visit Number 2    Number of Visits 12    Date for PT Re-Evaluation 11/02/20    Authorization Type C[employer] & Medicaid Healthy Blue - no auth required    PT Start Time 1150   Pt arrived late   PT Stop Time 1235    PT Time Calculation (min) 45 min    Activity Tolerance Patient tolerated treatment well    Behavior During Therapy [facility] for tasks assessed/performed           Past Medical History:  Diagnosis Date  . Allergy-induced asthma    Allergy induced asthma  . Anxiety   . Bronchitis   . 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 Chronic systolic CHF (congestive heart failure) ([place])   . Essential hypertension    Taken for EF function and tachycardia  . GERD (gastroesophageal reflux disease)   . Headache    migraines  . 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 History of colon polyps   . History of ovarian cyst   . Pelvic hematoma, female   . Pelvic pain in female   . Peripartum cardiomyopathy    a. EF 40-45% in 2016, varying by several echoes. F/u echo 06/2015 showed EF 50%.   Marland Kitchen PONV (postoperative nausea and vomiting)   . Sinus tachycardia    a. 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 b. 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    Past Surgical History:  Procedure Laterality Date  . ABDOMINAL HYSTERECTOMY    . CESAREAN SECTION  03-01-2008  and 2016  . IM PINNING RIGHT ELBOW FX  1992   HARDWARE REMOVED  . LAPAROSCOPIC ASSISTED VAGINAL HYSTERECTOMY Left 02/04/2017   Procedure: LAPAROSCOPIC ASSISTED VAGINAL HYSTERECTOMY, LEFT  SALPINGOOPHORECTOMY;  Surgeon: Dian Queen, MD;  Location: [place];  Service: Gynecology;  Laterality: Left;  NEED BED  . LAPAROSCOPY N/A 07/29/2014   Procedure: LAPAROSCOPY DIAGNOSTIC;  Surgeon: Cyril Mourning, MD;  Location: [facility];  Service: Gynecology;  Laterality: N/A;  . OOPHORECTOMY Left 02/04/2017   Procedure: LEFT OOPHORECTOMY;  Surgeon: Dian Queen, MD;  Location: [facility];  Service: Gynecology;  Laterality: Left;    There were no vitals filed for this visit.   Subjective Assessment - 10/11/20 1152    Subjective Pt reports reports she feels that the initial HEP stretches have helped loosen things up with some brief relief noted but overall her pain is worse. Pain will sometimes limit her from straightening up during sit to stand. Also notes that pain is now more bilateral and a "knot" has no popped up on the R side as well as the L.    Diagnostic tests 08/21/20- lumbar x-ray: Moderate disc height loss at the L5-S1 level.    Patient Stated Goals "to be able to household stuff and things that I need to do w/o my back killing me"    Currently in Pain? Yes    Pain Score 3    2-3/10   Pain Location Back  Pain Orientation Lower;Right;Left    Pain Descriptors / Indicators Aching    Pain Type Acute pain;Chronic pain    Pain Frequency Intermittent                             [facility] Adult PT Treatment/Exercise - 10/11/20 1150      Self-Care   Self-Care Posture    Posture Provided education in proper posture and body mechanics for transitional mobility and typical daily tasks and activities around the home to reduce low back strain - handout provided.      Exercises   Exercises Lumbar      Lumbar Exercises: Stretches   Passive Hamstring Stretch Right;Left;1 rep;30 seconds    Passive Hamstring Stretch Limitations supine with strap    Single Knee to Chest Stretch Right;Left;1 rep;30 seconds    Single  Knee to Chest Stretch Limitations opp LE extended    Double Knee to Chest Stretch 5 reps;10 seconds   2 sets   Double Knee to Chest Stretch Limitations LEs supported on orange Pball for 1st set; unsupported for 2nd set    Lower Trunk Rotation Limitations 10 x 10 sec    ITB Stretch Right;Left;1 rep;30 seconds    ITB Stretch Limitations supine crossbody with strap    Figure 4 Stretch 1 rep;20 seconds;Supine;With overpressure    Figure 4 Stretch Limitations single leg figure 4 with opp LE extension      Lumbar Exercises: Aerobic   Nustep L3 x 6 min (UE/LE)      Lumbar Exercises: Supine   Pelvic Tilt 10 reps;5 seconds    Pelvic Tilt Limitations PPT    Bent Knee Raise 10 reps;2 seconds    Bent Knee Raise Limitations red TB brace march    Bridge 10 reps;5 seconds    Bridge Limitations + red TB hip ABD isometric                  PT Education - 10/11/20 1235    Education Details Review of initial HEP + update with basic lumbopelvic strengthening; Posture & body mechanics education    Person(s) Educated Patient    Methods Explanation;Demonstration;Verbal cues;Handout    Comprehension Verbalized understanding;Verbal cues required;Returned demonstration;Need further instruction            PT Short Term Goals - 10/11/20 1156      PT SHORT TERM GOAL #1   Title Patient will be independent with initial HEP    Status On-going   10/12/19 - cues required in initial HEP review   Target Date 10/12/20      PT SHORT TERM GOAL #2   Title Patient will verbalize/demonstrate understanding of neutral spine posture and proper body mechanics to reduce strain on lumbar spine    Status On-going   10/11/20 - posture education provided today   Target Date 10/12/20             PT Long Term Goals - 10/11/20 1157      PT LONG TERM GOAL #1   Title Patient will be independent with ongoing/advanced HEP +/- gym program for self-management at home    Status On-going    Target Date 11/02/20       PT LONG TERM GOAL #2   Title Patient to demonstrate appropriate posture and body mechanics needed for daily activities    Status On-going    Target Date 11/02/20      PT LONG  TERM GOAL #3   Title Decrease LBP pain by >/= 50% allowing patient increased ease of completion of daily household tasks    Status On-going    Target Date 11/02/20      PT LONG TERM GOAL #4   Title Patient to improve tissue quality as noted by reduced tissue tightness and tenderness to palpation    Status On-going    Target Date 11/02/20      PT LONG TERM GOAL #5   Title Patient to report ability to perform ADLs, household, and work-related tasks without increased pain    Status On-going    Target Date 11/02/20                 Plan - 10/11/20 1159    Clinical Impression Statement Cece reports temporary benefit from initial HEP stretches and notes improved flexibility but states pain is worse overall. HEP review requiring cues for positioning and hold times as well as technique on LTR. Progress stretching with [facility] as pt noting most benefit from LTR and piriformis stretches from the flexion motions. Initiated basic lumbopelvic strengthening and updated HEP accordingly. Pt may benefit from progression to quadruped stretching/strengthening as she notes she spends a lot of time crawling around on the floor picking up after her autistic son. Session concluded with instruction in proper posture and body mechanics for transitional movements and typical daily tasks as she notes she will get "stuck" in flexion sometimes when she goes to stand up and pain will worsen during standing chores such as washing dishes.    Personal Factors and Comorbidities Time since onset of injury/illness/exacerbation;Past/Current Experience;Fitness;Comorbidity 3+;Profession    Comorbidities HTN, pregnancy induced CHF/peripartum cardiomyopathy, allergy induced asthma, migraines, anxiety, GERD, R elbow fx s/p IM pinning with subsequent  hardware removal    Examination-Activity Limitations Hygiene/Grooming;Bathing;Bend;Caring for Others;Lift;Carry;Sit;Stand;Locomotion Level    Examination-Participation Restrictions Cleaning;Community Activity;Driving;Interpersonal Relationship;Laundry;Occupation;Shop    Rehab Potential Good    PT Frequency 2x / week    PT Duration 6 weeks    PT Treatment/Interventions ADLs/Self Care Home Management;Cryotherapy;Electrical Stimulation;Iontophoresis 4mg /ml Dexamethasone;Moist Heat;Traction;Ultrasound;Functional mobility training;Therapeutic activities;Therapeutic exercise;Neuromuscular re-education;Patient/family education;Manual techniques;Passive range of motion;Dry needling;Taping;Spinal Manipulations    PT Next Visit Plan progress lumbopelvic flexibility; posture & body mechanics education; initiate core/lumbopelvic strengthening; manual therapy & modalities PRN for pain    PT Home Exercise Plan MedBridge Access Code: [email or web address] (3/10, updated 3/30)    Consulted and Agree with Plan of Care Patient           Patient will benefit from skilled therapeutic intervention in order to improve the following deficits and impairments:  Decreased activity tolerance,Decreased knowledge of precautions,Decreased mobility,Decreased range of motion,Decreased strength,Difficulty walking,Hypomobility,Increased fascial restricitons,Increased muscle spasms,Impaired perceived functional ability,Impaired flexibility,Impaired sensation,Improper body mechanics,Postural dysfunction,Pain,Obesity  Visit Diagnosis: Chronic left-sided low back pain with bilateral sciatica  Muscle spasm of back  Muscle weakness (generalized)     Problem List Patient Active Problem List   Diagnosis Date Noted  . Pure hypercholesterolemia 08/21/2020  . Other allergic rhinitis 12/02/2019  . Migraines 09/15/2017  . Moderate persistent asthma 06/27/2016  . Anxiety state 12/19/2015  . Essential hypertension 10/09/2015  . CHF  (congestive heart failure), NYHA class I, chronic, systolic ([place]) 54/65/0354  . GERD (gastroesophageal reflux disease)     Percival Spanish, PT, MPT 10/11/2020, 1:12 PM  [facility] [address]  [place], [place], [address] Phone: [phone]   Fax:  [phone]  Name: Maryori  MACRINA LEHNERT MRN: [id number] Date of Birth: [birth year]

## 2020-10-11 NOTE — Patient Instructions (Signed)
Access Code: [email or web address] URL: [email or web address] Date: 10/11/2020 Prepared by: Annie Paras  Exercises Hooklying Hamstring Stretch with Strap - 2-3 x daily - 7 x weekly - 3 reps - 30 sec hold Supine ITB Stretch with Strap - 2-3 x daily - 7 x weekly - 3 reps - 30 sec hold Supine Figure 4 Piriformis Stretch with Leg Extension - 2-3 x daily - 7 x weekly - 3 reps - 30 sec hold Supine Lower Trunk Rotation - 2-3 x daily - 7 x weekly - 5 reps - 10 sec hold Supine Double Knee to Chest Modified - 2-3 x daily - 7 x weekly - 3 reps - 30 sec hold Supine Bridge with Resistance Band - 1 x daily - 7 x weekly - 2 sets - 10 reps - 5 sec hold Supine March with Resistance Band - 1 x daily - 7 x weekly - 2 sets - 10 reps - 2-3 sec hold hold  Patient Education Posture and Body Mechanics  Sleeping on Back  Place pillow under knees. A pillow with cervical support and a roll around waist are also helpful. Copyright  VHI. All rights reserved.  Sleeping on Side Place pillow between knees. Use cervical support under neck and a roll around waist as needed. Copyright  VHI. All rights reserved.   Sleeping on Stomach   If this is the only desirable sleeping position, place pillow under lower legs, and under stomach or chest as needed.  Posture - Sitting   Sit upright, head facing forward. Try using a roll to support lower back. Keep shoulders relaxed, and avoid rounded back. Keep hips level with knees. Avoid crossing legs for long periods. Stand to Sit / Sit to Stand   To sit: Bend knees to lower self onto front edge of chair, then scoot back on seat. To stand: Reverse sequence by placing one foot forward, and scoot to front of seat. Use rocking motion to stand up.   Work Height and Reach  Ideal work height is no more than 2 to 4 inches below elbow level when standing, and at elbow level when sitting. Reaching should be limited to arm's length, with elbows slightly  bent.  Bending  Bend at hips and knees, not back. Keep feet shoulder-width apart.    Posture - Standing   Good posture is important. Avoid slouching and forward head thrust. Maintain curve in low back and align ears over shoul- ders, hips over ankles.  Alternating Positions   Alternate tasks and change positions frequently to reduce fatigue and muscle tension. Take rest breaks. Computer Work   Position work to Programmer, multimedia. Use proper work and seat height. Keep shoulders back and down, wrists straight, and elbows at right angles. Use chair that provides full back support. Add footrest and lumbar roll as needed.  Getting Into / Out of Car  Lower self onto seat, scoot back, then bring in one leg at a time. Reverse sequence to get out.  Dressing  Lie on back to pull socks or slacks over feet, or sit and bend leg while keeping back straight.    Housework - Sink  Place one foot on ledge of cabinet under sink when standing at sink for prolonged periods.   Pushing / Pulling  Pushing is preferable to pulling. Keep back in proper alignment, and use leg muscles to do the work.  Deep Squat   Squat and lift with both arms held against upper trunk. Tighten stomach muscles without  holding breath. Use smooth movements to avoid jerking.  Avoid Twisting   Avoid twisting or bending back. Pivot around using foot movements, and bend at knees if needed when reaching for articles.  Carrying Luggage   Distribute weight evenly on both sides. Use a cart whenever possible. Do not twist trunk. Move body as a unit.   Lifting Principles .Maintain proper posture and head alignment. .Slide object as close as possible before lifting. .Move obstacles out of the way. .Test before lifting; ask for help if too heavy. .Tighten stomach muscles without holding breath. .Use smooth movements; do not jerk. .Use legs to do the work, and pivot with feet. .Distribute the work load symmetrically and  close to the center of trunk. .Push instead of pull whenever possible.   Ask For Help   Ask for help and delegate to others when possible. Coordinate your movements when lifting together, and maintain the low back curve.  Log Roll   Lying on back, bend left knee and place left arm across chest. Roll all in one movement to the right. Reverse to roll to the left. Always move as one unit. Housework - Sweeping  Use long-handled equipment to avoid stooping.   Housework - Wiping  Position yourself as close as possible to reach work surface. Avoid straining your back.  Laundry - Unloading Wash   To unload small items at bottom of washer, lift leg opposite to arm being used to reach.  [place] close to area to be raked. Use arm movements to do the work. Keep back straight and avoid twisting.     Cart  When reaching into cart with one arm, lift opposite leg to keep back straight.   Getting Into / Out of Bed  Lower self to lie down on one side by raising legs and lowering head at the same time. Use arms to assist moving without twisting. Bend both knees to roll onto back if desired. To sit up, start from lying on side, and use same move-ments in reverse. Housework - Vacuuming  Hold the vacuum with arm held at side. Step back and forth to move it, keeping head up. Avoid twisting.   Laundry - IT consultant so that bending and twisting can be avoided.   Laundry - Unloading Dryer  Squat down to reach into clothes dryer or use a reacher.  Gardening - Weeding / Probation officer or Kneel. Knee pads may be helpful.

## 2020-10-13 ENCOUNTER — Ambulatory Visit: Payer: Managed Care, Other (non HMO) | Attending: Internal Medicine

## 2020-10-13 ENCOUNTER — Other Ambulatory Visit: Payer: Self-pay

## 2020-10-13 DIAGNOSIS — M5441 Lumbago with sciatica, right side: Secondary | ICD-10-CM | POA: Diagnosis not present

## 2020-10-13 DIAGNOSIS — G8929 Other chronic pain: Secondary | ICD-10-CM | POA: Insufficient documentation

## 2020-10-13 DIAGNOSIS — M62830 Muscle spasm of back: Secondary | ICD-10-CM | POA: Diagnosis not present

## 2020-10-13 DIAGNOSIS — M5442 Lumbago with sciatica, left side: Secondary | ICD-10-CM | POA: Insufficient documentation

## 2020-10-13 DIAGNOSIS — M6281 Muscle weakness (generalized): Secondary | ICD-10-CM | POA: Insufficient documentation

## 2020-10-13 NOTE — Therapy (Addendum)
[facility] [address]  [place], [place], [address] Phone: [phone]   Fax:  [phone]  Physical Therapy Treatment / Discharge Summary  Patient Details  Name: Michelle Kane MRN: [id number] Date of Birth: [birth year] Referring Provider (PT): Webb Silversmith, NP   Encounter Date: 10/13/2020   PT End of Session - 10/13/20 1147    Visit Number 3    Number of Visits 12    Date for PT Re-Evaluation 11/02/20    Authorization Type [employer] & Medicaid [employer] - no auth required    PT Start Time 1102    PT Stop Time 1141    PT Time Calculation (min) 39 min    Activity Tolerance Patient tolerated treatment well    Behavior During Therapy [facility] for tasks assessed/performed           Past Medical History:  Diagnosis Date  . Allergy-induced asthma    Allergy induced asthma  . Anxiety   . Bronchitis   . 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 Chronic systolic CHF (congestive heart failure) ([place])   . Essential hypertension    Taken for EF function and tachycardia  . GERD (gastroesophageal reflux disease)   . Headache    migraines  . 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 History of colon polyps   . History of ovarian cyst   . Pelvic hematoma, female   . Pelvic pain in female   . Peripartum cardiomyopathy    a. EF 40-45% in 2016, varying by several echoes. F/u echo 06/2015 showed EF 50%.   Marland Kitchen PONV (postoperative nausea and vomiting)   . Sinus tachycardia    a. 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 b. 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    Past Surgical History:  Procedure Laterality Date  . ABDOMINAL HYSTERECTOMY    . CESAREAN SECTION  03-01-2008  and 2016  . IM PINNING RIGHT ELBOW FX  1992   HARDWARE REMOVED  . LAPAROSCOPIC ASSISTED VAGINAL HYSTERECTOMY Left 02/04/2017   Procedure: LAPAROSCOPIC ASSISTED VAGINAL HYSTERECTOMY, LEFT  SALPINGOOPHORECTOMY;  Surgeon: Dian Queen, MD;  Location: [place] [facility];  Service: Gynecology;  Laterality: Left;  NEED BED  . LAPAROSCOPY N/A 07/29/2014   Procedure: LAPAROSCOPY DIAGNOSTIC;  Surgeon: Cyril Mourning, MD;  Location: [facility];  Service: Gynecology;  Laterality: N/A;  . OOPHORECTOMY Left 02/04/2017   Procedure: LEFT OOPHORECTOMY;  Surgeon: Dian Queen, MD;  Location: [facility];  Service: Gynecology;  Laterality: Left;    There were no vitals filed for this visit.   Subjective Assessment - 10/13/20 1105    Subjective Pt reports that her back is feeling decent, no flare ups. The pillow in between in the knees has been helping when laying on her side.    Diagnostic tests 08/21/20- lumbar x-ray: Moderate disc height loss at the L5-S1 level.    Patient Stated Goals "to be able to household stuff and things that I need to do w/o my back killing me"    Currently in Pain? No/denies                             [facility] Adult PT Treatment/Exercise - 10/13/20 0001      Exercises   Exercises Lumbar      Lumbar Exercises: Stretches  Active Hamstring Stretch Right;Left;1 rep;30 seconds    Figure 4 Stretch 1 rep;30 seconds;Seated      Lumbar Exercises: Aerobic   Recumbent Bike L2x37mn      Lumbar Exercises: Seated   Other Seated Lumbar Exercises trunk rotations with Y tband 10 reps both ways    Other Seated Lumbar Exercises pball rollouts with orange ball into flexion 10 reps      Lumbar Exercises: Supine   Clam 10 reps;5 seconds;Limitations    Clam Limitations R tband; TrA activation    Dead Bug 10 reps;2 seconds;Limitations    Dead Bug Limitations break required d/t cramping in low back    Bridge 10 reps;5 seconds    Bridge Limitations + red TB hip ABD isometric    Straight Leg Raise 10 reps      Lumbar Exercises: Quadruped   Madcat/Old Horse 10 reps;Limitations    Madcat/Old Horse Limitations 3 sec  holds; cues for technique    Other Quadruped Lumbar Exercises bird dog 5 reps 2" hold, lots of swaying and instability noted                    PT Short Term Goals - 10/11/20 1156      PT SHORT TERM GOAL #1   Title Patient will be independent with initial HEP    Status On-going   10/12/19 - cues required in initial HEP review   Target Date 10/12/20      PT SHORT TERM GOAL #2   Title Patient will verbalize/demonstrate understanding of neutral spine posture and proper body mechanics to reduce strain on lumbar spine    Status On-going   10/11/20 - posture education provided today   Target Date 10/12/20             PT Long Term Goals - 10/11/20 1157      PT LONG TERM GOAL #1   Title Patient will be independent with ongoing/advanced HEP +/- gym program for self-management at home    Status On-going    Target Date 11/02/20      PT LONG TERM GOAL #2   Title Patient to demonstrate appropriate posture and body mechanics needed for daily activities    Status On-going    Target Date 11/02/20      PT LONG TERM GOAL #3   Title Decrease LBP pain by >/= 50% allowing patient increased ease of completion of daily household tasks    Status On-going    Target Date 11/02/20      PT LONG TERM GOAL #4   Title Patient to improve tissue quality as noted by reduced tissue tightness and tenderness to palpation    Status On-going    Target Date 11/02/20      PT LONG TERM GOAL #5   Title Patient to report ability to perform ADLs, household, and work-related tasks without increased pain    Status On-going    Target Date 11/02/20                 Plan - 10/13/20 1148    Clinical Impression Statement Pt responded well to treatment today, progressed her exercises incorporating more dynamic movements and increased need for stability. Pt did have some difficulty with some of the exercises, noting some increased pulling in her low back but no pain with any of the exercises. Cont to  educate on posture and body mechanics, pt notes that she is becoming more aware of this and making ajustments  during the day as neccessary. Instruction given on keeping core activation and pelvis in neutral position during exercises and cues to correct form during exercises.    Personal Factors and Comorbidities Time since onset of injury/illness/exacerbation;Past/Current Experience;Fitness;Comorbidity 3+;Profession    Comorbidities HTN, pregnancy induced CHF/peripartum cardiomyopathy, allergy induced asthma, migraines, anxiety, GERD, R elbow fx s/p IM pinning with subsequent hardware removal    PT Frequency 2x / week    PT Duration 6 weeks    PT Treatment/Interventions ADLs/Self Care Home Management;Cryotherapy;Electrical Stimulation;Iontophoresis 49m/ml Dexamethasone;Moist Heat;Traction;Ultrasound;Functional mobility training;Therapeutic activities;Therapeutic exercise;Neuromuscular re-education;Patient/family education;Manual techniques;Passive range of motion;Dry needling;Taping;Spinal Manipulations    PT Next Visit Plan progress lumbopelvic flexibility; posture & body mechanics education; initiate core/lumbopelvic strengthening; manual therapy & modalities PRN for pain    PT Home Exercise Plan MedBridge Access Code: [email or web address] (3/10, updated 3/30)    Consulted and Agree with Plan of Care Patient           Patient will benefit from skilled therapeutic intervention in order to improve the following deficits and impairments:  Decreased activity tolerance,Decreased knowledge of precautions,Decreased mobility,Decreased range of motion,Decreased strength,Difficulty walking,Hypomobility,Increased fascial restricitons,Increased muscle spasms,Impaired perceived functional ability,Impaired flexibility,Impaired sensation,Improper body mechanics,Postural dysfunction,Pain,Obesity  Visit Diagnosis: Chronic left-sided low back pain with bilateral sciatica  Muscle spasm of back  Muscle weakness  (generalized)     Problem List Patient Active Problem List   Diagnosis Date Noted  . Pure hypercholesterolemia 08/21/2020  . Other allergic rhinitis 12/02/2019  . Migraines 09/15/2017  . Moderate persistent asthma 06/27/2016  . Anxiety state 12/19/2015  . Essential hypertension 10/09/2015  . CHF (congestive heart failure), NYHA class I, chronic, systolic ([place] 016/07/930 . GERD (gastroesophageal reflux disease)     BArtist Pais PTA 10/13/2020, 11:59 AM  [facility] [place] [address]Phone: [phone]  Fax:  [phone] Name: Michelle ESTALAMRN: [id number] of Birth: [birth year] PHYSICAL THERAPY DISCHARGE SUMMARY  Visits from Start of Care: 3  Current functional level related to goals / functional outcomes:   Refer to above clinical impression for status as of last visit on 10/13/2020. Patient failed to schedule any further PT appointments in >30 days, therefore will proceed with discharge from PT for this episode.   Remaining deficits:   As above. Unable to formally assess status at discharge due to failure to return to PT.   Education / Equipment:   HEP  Plan: Patient agrees to discharge.  Patient goals were not met. Patient is being discharged due to not returning since the last visit.  ?????     JPercival Spanish PT, MPT 12/20/20, 9:45 AM  [facility]2[place] [address]Phone: [phone]  Fax:  [phone]

## 2020-11-28 ENCOUNTER — Other Ambulatory Visit: Payer: Managed Care, Other (non HMO)

## 2020-11-28 ENCOUNTER — Telehealth: Payer: Self-pay | Admitting: Radiology

## 2020-11-28 DIAGNOSIS — E785 Hyperlipidemia, unspecified: Secondary | ICD-10-CM

## 2020-11-28 NOTE — Telephone Encounter (Signed)
Ordered.  Is she going to f/u here or with [facility]?  Please let me know. Thanks.

## 2020-11-28 NOTE — Telephone Encounter (Signed)
This patient is scheduled for labs today, Michelle Kane's pt. Can you order a Lipid?

## 2020-11-29 NOTE — Telephone Encounter (Signed)
LMTCB

## 2020-11-29 NOTE — Telephone Encounter (Signed)
Please check with patient.  See below.  Didn't come in for labs.  Thanks.

## 2020-12-01 NOTE — Telephone Encounter (Signed)
Called patient; no answer and VM not set up. Will send mychart message.

## 2020-12-28 ENCOUNTER — Telehealth (INDEPENDENT_AMBULATORY_CARE_PROVIDER_SITE_OTHER): Payer: Managed Care, Other (non HMO) | Admitting: Internal Medicine

## 2020-12-28 ENCOUNTER — Encounter: Payer: Self-pay | Admitting: Internal Medicine

## 2020-12-28 ENCOUNTER — Other Ambulatory Visit: Payer: Self-pay

## 2020-12-28 DIAGNOSIS — J0110 Acute frontal sinusitis, unspecified: Secondary | ICD-10-CM | POA: Diagnosis not present

## 2020-12-28 MED ORDER — PREDNISONE 20 MG PO TABS: 40.0000 mg | ORAL_TABLET | Freq: Every day | ORAL | 0 refills | Status: DC

## 2020-12-28 MED ORDER — AMOXICILLIN 500 MG PO TABS: 1000.0000 mg | ORAL_TABLET | Freq: Two times a day (BID) | ORAL | 0 refills | Status: AC

## 2020-12-28 NOTE — Assessment & Plan Note (Signed)
Has sinus symptoms with underlying allergic asthma May be bacterial Will try amoxicillin for infection 3 days of prednisone

## 2020-12-28 NOTE — Progress Notes (Signed)
Subjective:    Patient ID: Michelle Kane, female    DOB: 23-Dec-1985, 35 y.o.   MRN: [id number]  HPI Video virtual visit for respiratory symptoms Identification done Reviewed limitations and billing and she gave consent Participants--patient in her home and I am in my office  On multiple allergy medications--for sig symptoms Was outside at a function last week--and lost voice Lots of nasal drainage and "rough cough" Drainage from nose and PND Cough is day and night No SOB  Is on symbicort  Ears are stopped up Right temporal headache No fever  Home COVID test negative--- after 2 days of symptoms  Stay at home mom No one else has symptoms  No different medications  Current Outpatient Medications on File Prior to Visit  Medication Sig Dispense Refill   albuterol (VENTOLIN HFA) 108 (90 Base) MCG/ACT inhaler Inhale 2 puffs into the lungs every 4 (four) hours as needed for wheezing or shortness of breath. 18 g 1   azelastine (ASTELIN) 0.1 % nasal spray Place 1-2 sprays into both nostrils 2 (two) times daily as needed. 30 mL 5   budesonide-formoterol (SYMBICORT) 160-4.5 MCG/ACT inhaler 2 puffs twice daily with spacer to prevent coughing or wheezing. Rinse,gargle and spit after use. 1 Inhaler 5   fexofenadine (ALLEGRA) 60 MG tablet Take 60 mg by mouth 2 (two) times daily.     Fluticasone Propionate (XHANCE) 93 MCG/ACT EXHU 2 sprays per nostril twice a day if needed for stuffy nose. 32 mL 5   levocetirizine (XYZAL) 5 MG tablet Take 5 mg by mouth in the morning and at bedtime.     LORazepam (ATIVAN) 1 MG tablet Take 1 mg by mouth every 6 (six) hours as needed for anxiety.     Olopatadine HCl (PATADAY) 0.2 % SOLN 1 drop each eye as needed for itchy eyes. 2.5 mL 5   pantoprazole (PROTONIX) 40 MG tablet Take 1 tablet (40 mg total) by mouth daily. Take 30-60 min before first meal of the day 90 tablet 3   No current facility-administered medications on file prior to visit.     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 EF 40-45% in 2016, varying by several echoes. F/u echo 06/2015 showed EF 50%.    PONV (postoperative nausea and vomiting)    Sinus tachycardia    a. 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 b. 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    Past Surgical History:  Procedure Laterality Date   ABDOMINAL HYSTERECTOMY     CESAREAN SECTION  03-01-2008  and 2016   IM PINNING RIGHT ELBOW FX  1992   HARDWARE REMOVED   LAPAROSCOPIC ASSISTED VAGINAL HYSTERECTOMY Left 02/04/2017   Procedure: LAPAROSCOPIC ASSISTED VAGINAL HYSTERECTOMY, LEFT SALPINGOOPHORECTOMY;  Surgeon: Dian Queen, MD;  Location: [facility];  Service: Gynecology;  Laterality: Left;  NEED BED   LAPAROSCOPY N/A 07/29/2014   Procedure: LAPAROSCOPY DIAGNOSTIC;  Surgeon: Cyril Mourning, MD;  Location[facility];  Service: Gynecology;  Laterality: N/A;   OOPHORECTOMY Left 02/04/2017   Procedure: LEFT OOPHORECTOMY;  Surgeon: Dian Queen, MD;  [facility];  Service: Gynecology;  Laterality: Left;    Family History  Problem Relation Age of Onset   Asthma Mother    Crohn's disease Mother    Colon polyps Mother    Allergic rhinitis Mother    Hyperlipidemia Father    Hemophilia Father    COPD Father        smoked   Urticaria Father    Hemophilia Son    Allergic rhinitis Son    Food Allergy Son    Heart attack Maternal Grandfather    Diabetes Paternal Grandfather    Stroke Paternal Grandfather    Heart failure Brother    Heart failure Paternal Uncle    Heart failure Paternal Aunt    COPD Maternal Grandmother        smoked   Lung cancer Maternal Grandmother        smoked   Allergic rhinitis Sister    Asthma Niece    Allergic rhinitis Daughter    Hypertension Neg Hx    Eczema Neg Hx    Angioedema Neg Hx    Atopy Neg Hx    Immunodeficiency Neg Hx     Social History   Socioeconomic History   Marital status: Married    Spouse name: Not on file   Number of children: Not on file   Years of education: Not on file   Highest education level: Not on file  Occupational History   Occupation: CNA   Tobacco Use   Smoking status: Former    Packs/day: 0.50    Years: 10.00    Pack years: 5.00    Types: Cigarettes    Quit date: 02/13/2015    Years since quitting: 5.8   Smokeless tobacco: Never  Vaping Use   Vaping Use: Never used  Substance and Sexual Activity   Alcohol use: Yes    Alcohol/week: 0.0 standard drinks    Comment: social   Drug use: No   Sexual activity: Yes    Birth control/protection: Surgical  Other Topics Concern   Not on file  Social History Narrative   Not on file   Social Determinants of Health   Financial Resource Strain: Not on file  Food Insecurity: Not on file  Transportation Needs: Not on file  Physical Activity: Not on file  Stress: Not on file  Social Connections: Not on file  Intimate Partner Violence: Not on file   Review of Systems No loss of taste or smell No N/V Eating  okay    Objective:   Physical Exam Constitutional:      Appearance: Normal appearance.  Pulmonary:     Effort: Pulmonary effort is normal. No respiratory distress.  Neurological:     Mental Status: She is alert.           Assessment &  Plan:

## 2021-05-16 ENCOUNTER — Telehealth: Payer: Self-pay | Admitting: *Deleted

## 2021-05-16 NOTE — Telephone Encounter (Signed)
PLEASE NOTE: All timestamps contained within this report are represented as [place] Standard Time. CONFIDENTIALTY NOTICE: This fax transmission is intended only for the addressee. It contains information that is legally privileged, confidential or otherwise protected from use or disclosure. If you are not the intended recipient, you are strictly prohibited from reviewing, disclosing, copying using or disseminating any of this information or taking any action in reliance on or regarding this information. If you have received this fax in error, please notify us immediately by telephone so that we can arrange for its return to [place]. Phone: [phone], Toll-Free: [phone], Fax: [phone] Page: 1 of 2 Call Id: [id number] [facility] - Client TELEPHONE ADVICE RECORD [facility] Patient Name: Michelle Kane Gender: Female DOB: [birth year] Age: 35 Y 2 M 13 D Return Phone Number: [phone] (Primary) Address: City/ State/ Zip: [place]  [address] Client [facility] - Client Client Site [place] - Day Physician [facility] - PHYSICIAN, Verita Schneiders- MD Contact Type Call Who Is Calling Patient / Member / Family / Caregiver Call Type Triage / Clinical Relationship To Patient Self Return Phone Number [phone] (Primary) Chief Complaint BREATHING - fast, heavy or wheezing Reason for Call Symptomatic / Request for [place] states patient has a fever of 102.0, headache, pain in ears, body aches, sore throat, and trouble breathing. She states she is coughing up mucus. Translation No Nurse Assessment Nurse: Leilani Merl, RN, Heather Date/Time (Eastern Time): 05/16/2021 11:11:57 AM Confirm and document reason for call. If symptomatic, describe symptoms. ---Caller states patient has a fever of 102.0, headache, pain in ears, body aches, sore throat, and trouble breathing. She states she is  coughing up mucus. This started on Monday. Does the patient have any new or worsening symptoms? ---Yes Will a triage be completed? ---Yes Related visit to physician within the last 2 weeks? ---No Does the PT have any chronic conditions? (i.e. diabetes, asthma, this includes High risk factors for pregnancy, etc.) ---Yes List chronic conditions. ---tachy, CHF during and after preg. Is the patient pregnant or possibly pregnant? (Ask all females between the ages of 29-55) ---No Is this a behavioral health or substance abuse call? ---No Guidelines Guideline Title Affirmed Question Affirmed Notes Nurse Date/Time (Eastern Time) COVID-19 - Diagnosed or Suspected Chest pain or pressure (Exception: MILD central chest pain, present only when coughing) Standifer, RN, Heather 05/16/2021 11:13:11 AM PLEASE NOTE: All timestamps contained within this report are represented as [place] Standard Time. CONFIDENTIALTY NOTICE: This fax transmission is intended only for the addressee. It contains information that is legally privileged, confidential or otherwise protected from use or disclosure. If you are not the intended recipient, you are strictly prohibited from reviewing, disclosing, copying using or disseminating any of this information or taking any action in reliance on or regarding this information. If you have received this fax in error, please notify us immediately by telephone so that we can arrange for its return to [place]. Phone: [phone], Toll-Free: [phone], Fax: [phone] Page: 2 of 2 Call Id: [id number] [place]. Time Eilene Ghazi Time) Disposition Final User 05/16/2021 11:15:53 AM Go to ED Now (or PCP triage) Yes Standifer, RN, Soyla Murphy Disagree/Comply Comply Caller Understands Yes PreDisposition Call Doctor Care Advice Given Per Guideline GO TO ED NOW (OR PCP TRIAGE): * IF NO PCP (PRIMARY CARE PROVIDER) SECOND-LEVEL TRIAGE: You need to be seen within the next hour. Go to the  [facility] at _[facility] [place] as soon  as you can. CARE ADVICE given per COVID-19 - DIAGNOSED OR SUSPECTED (Adult) guideline. Referrals [facility] - ED

## 2021-05-16 NOTE — Telephone Encounter (Signed)
Tried to call the patient again and got her voicemail. Left a message on voicemail for patient to call the office back. Have made several attempts to contact the patient. No record of patient being seen in the [facility] system because of her symptoms today.

## 2021-05-16 NOTE — Telephone Encounter (Signed)
Tried to reach patient by telephone several times and got her voicemail. Left a message on voicemail for patient to call the office back.

## 2021-05-16 NOTE — Telephone Encounter (Signed)
Left another message for patient to call the office back. Left a message on patient's husband cell phone to have patient call the office back.

## 2021-05-16 NOTE — Telephone Encounter (Signed)
Please try to call tomorrow.  Recommend virtual visit with provider in our our office or she may go to [facility] for in person eval.

## 2021-05-17 ENCOUNTER — Emergency Department (INDEPENDENT_AMBULATORY_CARE_PROVIDER_SITE_OTHER)
Admission: RE | Admit: 2021-05-17 | Discharge: 2021-05-17 | Disposition: A | Discharge status: Home / Self Care | Payer: BLUE CROSS/BLUE SHIELD | Source: Ambulatory Visit | Attending: Family Medicine | Admitting: Family Medicine

## 2021-05-17 ENCOUNTER — Other Ambulatory Visit: Payer: Self-pay

## 2021-05-17 VITALS — BP 135/83 | HR 94 | Temp 98.5°F | Resp 14

## 2021-05-17 DIAGNOSIS — J111 Influenza due to unidentified influenza virus with other respiratory manifestations: Secondary | ICD-10-CM | POA: Diagnosis not present

## 2021-05-17 MED ORDER — BENZONATATE 200 MG PO CAPS: 200.0000 mg | ORAL_CAPSULE | Freq: Three times a day (TID) | ORAL | 0 refills | Status: DC | PRN

## 2021-05-17 MED ORDER — ONDANSETRON 8 MG PO TBDP: 8.0000 mg | ORAL_TABLET | Freq: Three times a day (TID) | ORAL | 0 refills | Status: DC | PRN

## 2021-05-17 MED ORDER — OSELTAMIVIR PHOSPHATE 75 MG PO CAPS: 75.0000 mg | ORAL_CAPSULE | Freq: Two times a day (BID) | ORAL | 0 refills | Status: DC

## 2021-05-17 NOTE — Telephone Encounter (Signed)
Left v/m for pt to cb. I spoke with pts mom and she said pt is very sick and she will call pt and ask her to answer the phone in the next 5 mins. Pt lives in [place].  I spoke with pt; pt is very congested and temp 100- 101. Pt has prod cough with tan to green phlegm, S/T that really hurts to swallow, earache and SOB with H/A and fever. I scheduled pt and appt at [facility] in [place] 04/16/21 at [facility]; this was first available appt that worked for pts schedule (pt has appt at [facility] for her two children that are also sick.) UC & ED precautions given and pt voiced understanding. Sending note to Dr Darnell Level as Juluis Rainier.

## 2021-05-17 NOTE — Telephone Encounter (Signed)
Noted! Thank you

## 2021-05-17 NOTE — ED Triage Notes (Signed)
Pt presents with URI symptoms that began 3 days ago. Pt states both of her children tested positive for flu A today.

## 2021-05-17 NOTE — Discharge Instructions (Addendum)
Drink lots of fluids Take Zofran as needed for nausea and vomiting Take the Tamiflu 2 times a day for 5 days Take benzonatate as needed for coughing  see your doctor if not improving by Monday

## 2021-05-17 NOTE — ED Provider Notes (Signed)
[facility]    CSN: [id number] Arrival date & time: 05/17/21  1741      History   Chief Complaint Chief Complaint  Patient presents with  . Fever  . Headache  . Cough  . Sore Throat    HPI Michelle Kane is a 35 y.o. female.   HPI  Patient has been sick with cough body aches fever and vomiting for the last 2-1/2 days.  Both of her daughters tested positive for influenza today.  She is here for medical treatment.  She also needs a note for work.  Past Medical History:  Diagnosis Date  . Allergy-induced asthma    Allergy induced asthma  . Anxiety   . Bronchitis   . 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 Chronic systolic CHF (congestive heart failure) ([place])   . Essential hypertension    Taken for EF function and tachycardia  . GERD (gastroesophageal reflux disease)   . Headache    migraines  . 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 History of colon polyps   . History of ovarian cyst   . Pelvic hematoma, female   . Pelvic pain in female   . Peripartum cardiomyopathy    a. EF 40-45% in 2016, varying by several echoes. F/u echo 06/2015 showed EF 50%.   Marland Kitchen PONV (postoperative nausea and vomiting)   . Sinus tachycardia    a. 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 b. 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    Patient Active Problem List   Diagnosis Date Noted  . Acute non-recurrent frontal sinusitis 12/28/2020  . Pure hypercholesterolemia 08/21/2020  . Other allergic rhinitis 12/02/2019  . Migraines 09/15/2017  . Moderate persistent asthma 06/27/2016  . Anxiety state 12/19/2015  . Essential hypertension 10/09/2015  . CHF (congestive heart failure), NYHA class I, chronic, systolic ([place]) 76/81/1572  . GERD (gastroesophageal reflux disease)     Past Surgical History:  Procedure Laterality Date  . ABDOMINAL HYSTERECTOMY    . CESAREAN SECTION  03-01-2008  and 2016  . IM PINNING RIGHT  ELBOW FX  1992   HARDWARE REMOVED  . LAPAROSCOPIC ASSISTED VAGINAL HYSTERECTOMY Left 02/04/2017   Procedure: LAPAROSCOPIC ASSISTED VAGINAL HYSTERECTOMY, LEFT SALPINGOOPHORECTOMY;  Surgeon: Dian Queen, MD;  Location: [facility];  Service: Gynecology;  Laterality: Left;  NEED BED  . LAPAROSCOPY N/A 07/29/2014   Procedure: LAPAROSCOPY DIAGNOSTIC;  Surgeon: Cyril Mourning, MD;  Location: [facility];  Service: Gynecology;  Laterality: N/A;  . OOPHORECTOMY Left 02/04/2017   Procedure: LEFT OOPHORECTOMY;  Surgeon: Dian Queen, MD;  Location: [facility];  Service: Gynecology;  Laterality: Left;    OB History     Gravida  4   Para  2   Term  2   Preterm      AB  1   Living  2      SAB  1   IAB      Ectopic      Multiple      Live Births               Home Medications    Prior to Admission medications   Medication Sig Start Date End Date Taking? Authorizing Provider  benzonatate (TESSALON) 200 MG capsule Take 1 capsule (200 mg total) by mouth 3 (three) times daily as needed for cough. 05/17/21  Yes Blanchie Serve  Collie Siad, MD  ondansetron (ZOFRAN ODT) 8 MG disintegrating tablet Take 1 tablet (8 mg total) by mouth every 8 (eight) hours as needed for nausea or vomiting. 05/17/21  Yes Raylene Everts, MD  oseltamivir (TAMIFLU) 75 MG capsule Take 1 capsule (75 mg total) by mouth every 12 (twelve) hours. 05/17/21  Yes Raylene Everts, MD  albuterol (VENTOLIN HFA) 108 (90 Base) MCG/ACT inhaler Inhale 2 puffs into the lungs every 4 (four) hours as needed for wheezing or shortness of breath. 12/02/19   Bobbitt, Sedalia Muta, MD  azelastine (ASTELIN) 0.1 % nasal spray Place 1-2 sprays into both nostrils 2 (two) times daily as needed. 01/31/20   Bobbitt, Sedalia Muta, MD  budesonide-formoterol (SYMBICORT) 160-4.5 MCG/ACT inhaler 2 puffs twice daily with spacer to prevent coughing or wheezing. Rinse,gargle and spit after use.  12/02/19   Bobbitt, Sedalia Muta, MD  fexofenadine (ALLEGRA) 60 MG tablet Take 60 mg by mouth 2 (two) times daily.    [provider]  Fluticasone Propionate (XHANCE) 93 MCG/ACT EXHU 2 sprays per nostril twice a day if needed for stuffy nose. 12/02/19   Bobbitt, Sedalia Muta, MD  levocetirizine (XYZAL) 5 MG tablet Take 5 mg by mouth in the morning and at bedtime.    [provider]  LORazepam (ATIVAN) 1 MG tablet Take 1 mg by mouth every 6 (six) hours as needed for anxiety.    [provider]  pantoprazole (PROTONIX) 40 MG tablet Take 1 tablet (40 mg total) by mouth daily. Take 30-60 min before first meal of the day 08/21/20   Jearld Fenton, NP    Family History Family History  Problem Relation Age of Onset  . Asthma Mother   . Crohn's disease Mother   . Colon polyps Mother   . Allergic rhinitis Mother   . Hyperlipidemia Father   . Hemophilia Father   . COPD Father        smoked  . Urticaria Father   . Hemophilia Son   . Allergic rhinitis Son   . Food Allergy Son   . Heart attack Maternal Grandfather   . Diabetes Paternal Grandfather   . Stroke Paternal Grandfather   . Heart failure Brother   . Heart failure Paternal Uncle   . Heart failure Paternal Aunt   . COPD Maternal Grandmother        smoked  . Lung cancer Maternal Grandmother        smoked  . Allergic rhinitis Sister   . Asthma Niece   . Allergic rhinitis Daughter   . Hypertension Neg Hx   . Eczema Neg Hx   . Angioedema Neg Hx   . Atopy Neg Hx   . Immunodeficiency Neg Hx     Social History Social History   Tobacco Use  . Smoking status: Former    Packs/day: 0.50    Years: 10.00    Pack years: 5.00    Types: Cigarettes    Quit date: 02/13/2015    Years since quitting: 6.2  . Smokeless tobacco: Never  Vaping Use  . Vaping Use: Never used  Substance Use Topics  . Alcohol use: Yes    Alcohol/week: 0.0 standard drinks    Comment: social  . Drug use: No     Allergies   Buspar  [buspirone], Vicodin [hydrocodone-acetaminophen], Labetalol, and Zyrtec [cetirizine]   Review of Systems Review of Systems See HPI   Physical Exam Triage Vital Signs ED Triage Vitals  Enc Vitals Group  BP 05/17/21 1825 135/83     Pulse Rate 05/17/21 1825 94     Resp 05/17/21 1825 14     Temp 05/17/21 1825 98.5 F (36.9 C)     Temp Source 05/17/21 1825 Oral     SpO2 05/17/21 1825 97 %     Weight --      Height --      Head Circumference --      Peak Flow --      Pain Score 05/17/21 1826 3     Pain Loc --      Pain Edu? --      Excl. in [place]? --    No data found.  Updated Vital Signs BP 135/83 (BP Location: Left Arm)   Pulse 94   Temp 98.5 F (36.9 C) (Oral)   Resp 14   LMP 12/09/2016   SpO2 97%     Physical Exam Constitutional:      General: She is not in acute distress.    Appearance: She is well-developed. She is obese. She is ill-appearing.  HENT:     Head: Normocephalic and atraumatic.     Right Ear: Tympanic membrane and ear canal normal.     Left Ear: Tympanic membrane and ear canal normal.     Nose: Congestion and rhinorrhea present.     Mouth/Throat:     Pharynx: Posterior oropharyngeal erythema present.  Eyes:     Conjunctiva/sclera: Conjunctivae normal.     Pupils: Pupils are equal, round, and reactive to light.  Cardiovascular:     Rate and Rhythm: Normal rate and regular rhythm.     Heart sounds: Normal heart sounds.  Pulmonary:     Effort: Pulmonary effort is normal. No respiratory distress.     Breath sounds: No wheezing or rhonchi.  Abdominal:     General: There is no distension.     Palpations: Abdomen is soft.  Musculoskeletal:        General: Normal range of motion.     Cervical back: Normal range of motion.  Lymphadenopathy:     Cervical: No cervical adenopathy.  Skin:    General: Skin is warm and dry.  Neurological:     Mental Status: She is alert.  Psychiatric:        Mood and Affect: Mood normal.        Behavior: Behavior  normal.     UC Treatments / Results  Labs (all labs ordered are listed, but only abnormal results are displayed) Labs Reviewed - No data to display  EKG   Radiology No results found.  Procedures Procedures (including critical care time)  Medications Ordered in UC Medications - No data to display  Initial Impression / Assessment and Plan / UC Course  I have reviewed the triage vital signs and the nursing notes.  Pertinent labs & imaging results that were available during my care of the patient were reviewed by me and considered in my medical decision making (see chart for details).     Final Clinical Impressions(s) / UC Diagnoses   Final diagnoses:  Influenza     Discharge Instructions      Drink lots of fluids Take Zofran as needed for nausea and vomiting Take the Tamiflu 2 times a day for 5 days Take benzonatate as needed for coughing  see your doctor if not improving by Monday    ED Prescriptions     Medication Sig Dispense Auth. Provider   oseltamivir (TAMIFLU) 75  MG capsule Take 1 capsule (75 mg total) by mouth every 12 (twelve) hours. 10 capsule Raylene Everts, MD   benzonatate (TESSALON) 200 MG capsule Take 1 capsule (200 mg total) by mouth 3 (three) times daily as needed for cough. 21 capsule Raylene Everts, MD   ondansetron (ZOFRAN ODT) 8 MG disintegrating tablet Take 1 tablet (8 mg total) by mouth every 8 (eight) hours as needed for nausea or vomiting. 20 tablet Raylene Everts, MD      PDMP not reviewed this encounter.   Raylene Everts, MD 05/17/21 [phone]

## 2021-08-23 ENCOUNTER — Encounter: Payer: Self-pay | Admitting: Nurse Practitioner

## 2021-08-23 ENCOUNTER — Other Ambulatory Visit: Payer: Self-pay

## 2021-08-23 ENCOUNTER — Emergency Department (HOSPITAL_BASED_OUTPATIENT_CLINIC_OR_DEPARTMENT_OTHER): Payer: BC Managed Care – PPO

## 2021-08-23 ENCOUNTER — Ambulatory Visit (INDEPENDENT_AMBULATORY_CARE_PROVIDER_SITE_OTHER): Payer: BC Managed Care – PPO | Admitting: Nurse Practitioner

## 2021-08-23 ENCOUNTER — Emergency Department (HOSPITAL_BASED_OUTPATIENT_CLINIC_OR_DEPARTMENT_OTHER)
Admission: EM | Admit: 2021-08-23 | Discharge: 2021-08-23 | Disposition: A | Discharge status: Home / Self Care | Payer: BC Managed Care – PPO | Attending: Emergency Medicine | Admitting: Emergency Medicine

## 2021-08-23 ENCOUNTER — Encounter (HOSPITAL_BASED_OUTPATIENT_CLINIC_OR_DEPARTMENT_OTHER): Payer: Self-pay

## 2021-08-23 VITALS — BP 122/78 | HR 94 | Temp 96.3°F | Ht 67.0 in | Wt 292.0 lb

## 2021-08-23 DIAGNOSIS — R102 Pelvic and perineal pain: Secondary | ICD-10-CM | POA: Diagnosis not present

## 2021-08-23 DIAGNOSIS — N83291 Other ovarian cyst, right side: Secondary | ICD-10-CM | POA: Insufficient documentation

## 2021-08-23 DIAGNOSIS — R109 Unspecified abdominal pain: Secondary | ICD-10-CM | POA: Diagnosis not present

## 2021-08-23 DIAGNOSIS — R1031 Right lower quadrant pain: Secondary | ICD-10-CM | POA: Insufficient documentation

## 2021-08-23 DIAGNOSIS — N83201 Unspecified ovarian cyst, right side: Secondary | ICD-10-CM | POA: Diagnosis not present

## 2021-08-23 DIAGNOSIS — Z90710 Acquired absence of both cervix and uterus: Secondary | ICD-10-CM | POA: Diagnosis not present

## 2021-08-23 LAB — COMPREHENSIVE METABOLIC PANEL
ALT: 33 U/L (ref 0–44)
AST: 24 U/L (ref 15–41)
Albumin: 3.8 g/dL (ref 3.5–5.0)
Alkaline Phosphatase: 59 U/L (ref 38–126)
Anion gap: 9 (ref 5–15)
BUN: 7 mg/dL (ref 6–20)
CO2: 24 mmol/L (ref 22–32)
Calcium: 8.9 mg/dL (ref 8.9–10.3)
Chloride: 104 mmol/L (ref 98–111)
Creatinine, Ser: 0.65 mg/dL (ref 0.44–1.00)
GFR, Estimated: >60 mL/min (ref >60)
Glucose, Bld: 99 mg/dL (ref 70–99)
Potassium: 3.7 mmol/L (ref 3.5–5.1)
Sodium: 137 mmol/L (ref 135–145)
Total Bilirubin: 0.6 mg/dL (ref 0.3–1.2)
Total Protein: 6.9 g/dL (ref 6.5–8.1)

## 2021-08-23 LAB — POCT URINALYSIS DIPSTICK
Bilirubin, UA: NEGATIVE
Blood, UA: NEGATIVE
Glucose, UA: NEGATIVE
Ketones, UA: NEGATIVE
Nitrite, UA: NEGATIVE
Protein, UA: NEGATIVE
Spec Grav, UA: 1.01 (ref 1.010–1.025)
Urobilinogen, UA: 1 E.U./dL
pH, UA: 6.5 (ref 5.0–8.0)

## 2021-08-23 LAB — LIPASE, BLOOD: Lipase: 39 U/L (ref 11–51)

## 2021-08-23 LAB — CBC
HCT: 40.4 % (ref 36.0–46.0)
Hemoglobin: 13.8 g/dL (ref 12.0–15.0)
MCH: 31 pg (ref 26.0–34.0)
MCHC: 34.2 g/dL (ref 30.0–36.0)
MCV: 90.8 fL (ref 80.0–100.0)
Platelets: 238 10*3/uL (ref 150–400)
RBC: 4.45 MIL/uL (ref 3.87–5.11)
RDW: 12.1 % (ref 11.5–15.5)
WBC: 10.6 10*3/uL — ABNORMAL HIGH (ref 4.0–10.5)
nRBC: 0 % (ref 0.0–0.2)

## 2021-08-23 MED ORDER — ONDANSETRON 4 MG PO TBDP: 4.0000 mg | ORAL_TABLET | Freq: Three times a day (TID) | ORAL | 0 refills | Status: DC | PRN

## 2021-08-23 MED ORDER — SODIUM CHLORIDE 0.9 % IV BOLUS
1000.0000 mL | Freq: Once | INTRAVENOUS | Status: AC
  Administered 2021-08-23: 1000 mL via INTRAVENOUS

## 2021-08-23 MED ORDER — FENTANYL CITRATE PF 50 MCG/ML IJ SOSY
50.0000 ug | PREFILLED_SYRINGE | Freq: Once | INTRAMUSCULAR | Status: AC
  Administered 2021-08-23: 50 ug via INTRAVENOUS
  Filled 2021-08-23: qty 1

## 2021-08-23 MED ORDER — ONDANSETRON HCL 4 MG/2ML IJ SOLN
4.0000 mg | Freq: Once | INTRAMUSCULAR | Status: AC
  Administered 2021-08-23: 4 mg via INTRAVENOUS
  Filled 2021-08-23: qty 2

## 2021-08-23 MED ORDER — OXYCODONE-ACETAMINOPHEN 5-325 MG PO TABS: 1.0000 | ORAL_TABLET | Freq: Four times a day (QID) | ORAL | 0 refills | Status: DC | PRN

## 2021-08-23 MED ORDER — IOHEXOL 300 MG/ML  SOLN
100.0000 mL | Freq: Once | INTRAMUSCULAR | Status: AC | PRN
  Administered 2021-08-23: 100 mL via INTRAVENOUS

## 2021-08-23 NOTE — Patient Instructions (Signed)
It was great to see you!  With your stomach pain, I am highly suspicious for appendicitis. I highly recommend you go to the ER as discussed.   Let's follow-up in 4 weeks, sooner if you have concerns.  If a referral was placed today, you will be contacted for an appointment. Please note that routine referrals can sometimes take up to 3-4 weeks to process. Please call our office if you haven't heard anything after this time frame.  Take care,  Vance Peper, NP

## 2021-08-23 NOTE — ED Triage Notes (Addendum)
Pt c/o sharp mid/right lower abd pain started ~12pm-c/o abd cramps x 2-3 days-states she was sent by PCP-states she has urine results in epic-NAD-steady gait

## 2021-08-23 NOTE — Discharge Instructions (Addendum)
CT scan and ultrasound showed a right ovarian cyst.  I have written you for some pain medicine and some nausea medicine.  Make sure you follow-up with a OB/GYN for reevaluation to make sure this has resolved and for further work-up.  Return for new or worsening symptoms  I have written a work note to be out tomorrow if you so desire, if you are feeling better you may return sooner

## 2021-08-23 NOTE — ED Notes (Signed)
Patient transported to CT 

## 2021-08-23 NOTE — Progress Notes (Signed)
Established Patient Office Visit  Subjective:  Patient ID: Michelle Kane, female    DOB: [birth year]  Age: 36 y.o. MRN: [id number]  CC:  Chief Complaint  Patient presents with   Transitions Of Care    Pt c/o possible kidney stone w/ lower right flank pain that radiates to right abd.     HPI Michelle Kane presents to establish care with a new provider.  Introduced to Designer, jewellery role and practice setting.  All questions answered.  Discussed provider/patient relationship and expectations.  ABDOMINAL PAIN   Duration:days- started today Onset: sudden Severity: 5/10 Quality: sharp, electric waves, pressure Location:  RLQ  Episode duration:  Radiation: yes - to back Frequency: constant Alleviating factors: nothing Aggravating factors: moving Status: stable Treatments attempted:  laying, heating pad Fever: no Nausea: no Vomiting: no Weight loss: no Decreased appetite: yes Diarrhea: yes Constipation: no Blood in stool: no Heartburn: no Jaundice: no Rash: no Dysuria/urinary frequency: no Hematuria: no Recurrent NSAID use: no   Past Medical History:  Diagnosis Date   Allergy-induced asthma    Allergy induced asthma   Anxiety    Bronchitis    Cancer ([place])    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 EF 40-45% in 2016, varying by several echoes. F/u echo 06/2015 showed EF 50%.    PONV (postoperative nausea and vomiting)    Sinus tachycardia    a. 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 b. 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    Past  Surgical History:  Procedure Laterality Date   ABDOMINAL HYSTERECTOMY     CESAREAN SECTION  03-01-2008  and 2016   IM PINNING RIGHT ELBOW FX  1992   HARDWARE REMOVED   LAPAROSCOPIC ASSISTED VAGINAL HYSTERECTOMY Left 02/04/2017   Procedure: LAPAROSCOPIC ASSISTED VAGINAL HYSTERECTOMY, LEFT SALPINGOOPHORECTOMY;  Surgeon: Dian Queen, MD;  Location: [facility];  Service: Gynecology;  Laterality: Left;  NEED BED   LAPAROSCOPY N/A 07/29/2014   Procedure: LAPAROSCOPY DIAGNOSTIC;  Surgeon: Cyril Mourning, MD;  Location: [facility];  Service: Gynecology;  Laterality: N/A;   OOPHORECTOMY Left 02/04/2017   Procedure: LEFT OOPHORECTOMY;  Surgeon: Dian Queen, MD;  Location: [facility];  Service: Gynecology;  Laterality: Left;    Family History  Problem Relation Age of Onset   Asthma Mother    Crohn's disease Mother    Colon polyps Mother    Allergic rhinitis Mother    Heart disease Father        heart failure   Hyperlipidemia Father    Hemophilia Father    COPD Father        smoked   Urticaria Father    Allergic rhinitis Sister    Heart failure Brother    Allergic rhinitis Daughter    Hemophilia Son    Allergic rhinitis Son    Food Allergy Son    Heart failure Paternal Aunt    Heart failure Paternal Uncle    COPD  Maternal Grandmother        smoked   Lung cancer Maternal Grandmother        smoked   Cancer Maternal Grandfather        skin   Heart attack Maternal Grandfather    Diabetes Paternal Grandfather    Stroke Paternal Grandfather    Asthma Niece    Hypertension Neg Hx    Eczema Neg Hx    Angioedema Neg Hx    Atopy Neg Hx    Immunodeficiency Neg Hx     Social History   Socioeconomic History   Marital status: Married    Spouse name: Not on file   Number of children: Not on file   Years of education: Not on file   Highest education level: Not on file  Occupational History   Occupation: CNA   Tobacco Use    Smoking status: Former    Packs/day: 0.50    Years: 10.00    Pack years: 5.00    Types: Cigarettes    Quit date: 02/13/2015    Years since quitting: 6.5   Smokeless tobacco: Never  Vaping Use   Vaping Use: Never used  Substance and Sexual Activity   Alcohol use: Yes    Alcohol/week: 0.0 standard drinks    Comment: social   Drug use: No   Sexual activity: Yes    Birth control/protection: Surgical  Other Topics Concern   Not on file  Social History Narrative   Not on file   Social Determinants of Health   Financial Resource Strain: Not on file  Food Insecurity: Not on file  Transportation Needs: Not on file  Physical Activity: Not on file  Stress: Not on file  Social Connections: Not on file  Intimate Partner Violence: Not on file    Outpatient Medications Prior to Visit  Medication Sig Dispense Refill   albuterol (VENTOLIN HFA) 108 (90 Base) MCG/ACT inhaler Inhale 2 puffs into the lungs every 4 (four) hours as needed for wheezing or shortness of breath. 18 g 1   azelastine (ASTELIN) 0.1 % nasal spray Place 1-2 sprays into both nostrils 2 (two) times daily as needed. 30 mL 5   benzonatate (TESSALON) 200 MG capsule Take 1 capsule (200 mg total) by mouth 3 (three) times daily as needed for cough. 21 capsule 0   budesonide-formoterol (SYMBICORT) 160-4.5 MCG/ACT inhaler 2 puffs twice daily with spacer to prevent coughing or wheezing. Rinse,gargle and spit after use. 1 Inhaler 5   fexofenadine (ALLEGRA) 60 MG tablet Take 60 mg by mouth 2 (two) times daily.     Fluticasone Propionate (XHANCE) 93 MCG/ACT EXHU 2 sprays per nostril twice a day if needed for stuffy nose. 32 mL 5   levocetirizine (XYZAL) 5 MG tablet Take 5 mg by mouth in the morning and at bedtime.     LORazepam (ATIVAN) 1 MG tablet Take 1 mg by mouth every 6 (six) hours as needed for anxiety.     ondansetron (ZOFRAN ODT) 8 MG disintegrating tablet Take 1 tablet (8 mg total) by mouth every 8 (eight) hours as needed for  nausea or vomiting. 20 tablet 0   pantoprazole (PROTONIX) 40 MG tablet Take 1 tablet (40 mg total) by mouth daily. Take 30-60 min before first meal of the day 90 tablet 3   oseltamivir (TAMIFLU) 75 MG capsule Take 1 capsule (75 mg total) by mouth every 12 (twelve) hours. 10 capsule 0   No facility-administered medications prior to visit.  Allergies  Allergen Reactions   Buspar [Buspirone] Other (See Comments)    Mouth, lips tingling, throat tightness, chest pain   Vicodin [Hydrocodone-Acetaminophen] Itching, Nausea And Vomiting and Other (See Comments)    GI pains also   Labetalol     Causes congestive heart failure   Zyrtec [Cetirizine]     Worsening anxiety    ROS Review of Systems See pertinent positives and negatives per HPI.   Objective:    Physical Exam Vitals and nursing note reviewed.  Constitutional:      General: She is not in acute distress.    Appearance: Normal appearance.  HENT:     Head: Normocephalic and atraumatic.  Eyes:     Conjunctiva/sclera: Conjunctivae normal.  Cardiovascular:     Rate and Rhythm: Normal rate and regular rhythm.     Pulses: Normal pulses.     Heart sounds: Normal heart sounds.  Pulmonary:     Effort: Pulmonary effort is normal.     Breath sounds: Normal breath sounds.  Abdominal:     Palpations: Abdomen is soft.     Tenderness: There is abdominal tenderness (RLQ) in the right lower quadrant. There is guarding and rebound. Positive signs include Rovsing's sign and McBurney's sign.  Musculoskeletal:     Cervical back: Normal range of motion.  Skin:    General: Skin is warm and dry.  Neurological:     General: No focal deficit present.     Mental Status: She is alert and oriented to person, place, and time.  Psychiatric:        Mood and Affect: Mood normal.        Behavior: Behavior normal.    BP 122/78    Pulse 94    Temp (!) 96.3 F (35.7 C) (Temporal)    Ht 5\' 7"  (1.702 m)    Wt 292 lb (132.5 kg)    LMP 12/09/2016     SpO2 98%    BMI 45.73 kg/m  Wt Readings from Last 3 Encounters:  08/23/21 292 lb (132.5 kg)  12/28/20 280 lb (127 kg)  08/21/20 (!) 305 lb (138.3 kg)     Health Maintenance Due  Topic Date Due   COVID-19 Vaccine (1) Never done   INFLUENZA VACCINE  02/12/2021    There are no preventive care reminders to display for this patient.  Lab Results  Component Value Date   TSH 0.89 08/21/2020   Lab Results  Component Value Date   WBC 7.0 08/21/2020   HGB 13.5 08/21/2020   HCT 39.6 08/21/2020   MCV 90.2 08/21/2020   PLT 223.0 08/21/2020   Lab Results  Component Value Date   NA 138 08/21/2020   K 3.9 08/21/2020   CO2 27 08/21/2020   GLUCOSE 87 08/21/2020   BUN 9 08/21/2020   CREATININE 0.63 08/21/2020   BILITOT 0.6 08/21/2020   ALKPHOS 61 08/21/2020   AST 15 08/21/2020   ALT 19 08/21/2020   PROT 6.7 08/21/2020   ALBUMIN 4.0 08/21/2020   CALCIUM 9.3 08/21/2020   ANIONGAP 8 07/14/2018   GFR 115.70 08/21/2020   Lab Results  Component Value Date   CHOL 202 (H) 08/21/2020   Lab Results  Component Value Date   HDL 37.10 (L) 08/21/2020   Lab Results  Component Value Date   LDLCALC 140 (H) 08/21/2020   Lab Results  Component Value Date   TRIG 126.0 08/21/2020   Lab Results  Component Value Date   CHOLHDL 5  08/21/2020   Lab Results  Component Value Date   HGBA1C 5.0 08/21/2020      Assessment & Plan:   Problem List Items Addressed This Visit       Other   Right lower quadrant abdominal pain    Acute pain, started this morning in RLQ that radiates to back and across lower abdomen. Positive McBurney's Point tenderness and Rosving's sign. U/A only shows trace leukocytes, no blood. Less likely kidney stone. High suspicion for appendicitis. Possibly ovarian cyst. She has a history of hysterectomy and left ovary removed, however right ovary still intact. Recommend emergency room evaluation. She agrees with this plan. Declines transport via EMS.       Other  Visit Diagnoses     Flank pain    -  Primary   Pain starts in RLQ and radiates to back. U/A negative for blood, less likely kidney stone.    Relevant Orders   POCT urinalysis dipstick (Completed)       No orders of the defined types were placed in this encounter.   Follow-up: Return if symptoms worsen or fail to improve, for CPE.    Charyl Dancer, NP

## 2021-08-23 NOTE — ED Provider Notes (Signed)
[facility] Provider Note   CSN: [id number] Arrival date & time: 08/23/21  1654    History  Chief Complaint  Patient presents with   Abdominal Pain    Michelle Kane is a 36 y.o. female past medical history significant for hysterectomy s/p left oophorectomy for evaluation of right lower quadrant abdominal pain.  Began earlier today.  Described as a dull aching as well as intermittent sharp and stabbing.  Goes into her back.  She denies any dysuria, hematuria.  Does have history of kidney stones.  Denies any vaginal discharge, concerns for any STDs.  No meds PTA.  Seen by family practitioner earlier today, sent here for further evaluation, rule out appy.  Pain 10/10.  UA at PCP without infection, blood per note.  HPI     Home Medications Prior to Admission medications   Medication Sig Start Date End Date Taking? Authorizing Provider  ondansetron (ZOFRAN-ODT) 4 MG disintegrating tablet Take 1 tablet (4 mg total) by mouth every 8 (eight) hours as needed for nausea or vomiting. 08/23/21  Yes Abdur Hoglund A, PA-C  oxyCODONE-acetaminophen (PERCOCET/ROXICET) 5-325 MG tablet Take 1 tablet by mouth every 6 (six) hours as needed for severe pain. 08/23/21  Yes Moritz Lever A, PA-C  albuterol (VENTOLIN HFA) 108 (90 Base) MCG/ACT inhaler Inhale 2 puffs into the lungs every 4 (four) hours as needed for wheezing or shortness of breath. 12/02/19   Bobbitt, Sedalia Muta, MD  azelastine (ASTELIN) 0.1 % nasal spray Place 1-2 sprays into both nostrils 2 (two) times daily as needed. 01/31/20   Bobbitt, Sedalia Muta, MD  benzonatate (TESSALON) 200 MG capsule Take 1 capsule (200 mg total) by mouth 3 (three) times daily as needed for cough. 05/17/21   Raylene Everts, MD  budesonide-formoterol [facility]) 160-4.5 MCG/ACT inhaler 2 puffs twice daily with spacer to prevent coughing or wheezing. Rinse,gargle and spit after use. 12/02/19   Bobbitt, Sedalia Muta, MD  fexofenadine  (ALLEGRA) 60 MG tablet Take 60 mg by mouth 2 (two) times daily.    [provider]  Fluticasone Propionate (XHANCE) 93 MCG/ACT EXHU 2 sprays per nostril twice a day if needed for stuffy nose. 12/02/19   Bobbitt, Sedalia Muta, MD  levocetirizine (XYZAL) 5 MG tablet Take 5 mg by mouth in the morning and at bedtime.    [provider]  LORazepam (ATIVAN) 1 MG tablet Take 1 mg by mouth every 6 (six) hours as needed for anxiety.    [provider]  pantoprazole (PROTONIX) 40 MG tablet Take 1 tablet (40 mg total) by mouth daily. Take 30-60 min before first meal of the day 08/21/20   Jearld Fenton, NP      Allergies    Buspar [buspirone], Vicodin [hydrocodone-acetaminophen], Labetalol, and Zyrtec [cetirizine]    Review of Systems   Review of Systems  Constitutional: Negative.   HENT: Negative.    Respiratory: Negative.    Cardiovascular: Negative.   Gastrointestinal:  Positive for abdominal pain. Negative for abdominal distention, anal bleeding, blood in stool, constipation, diarrhea, nausea, rectal pain and vomiting.  Genitourinary: Negative.   Musculoskeletal: Negative.   Skin: Negative.   Neurological: Negative.   All other systems reviewed and are negative.  Physical Exam Updated Vital Signs BP 123/74 (BP Location: Right Arm)    Pulse 80    Temp 97.9 F (36.6 C) (Oral)    Resp 18    LMP 12/09/2016    SpO2 100%  Physical Exam  Vitals and nursing note reviewed.  Constitutional:      General: She is not in acute distress.    Appearance: She is well-developed. She is obese. She is not ill-appearing, toxic-appearing or diaphoretic.  HENT:     Head: Normocephalic and atraumatic.  Eyes:     Pupils: Pupils are equal, round, and reactive to light.  Cardiovascular:     Rate and Rhythm: Normal rate.     Heart sounds: Normal heart sounds.  Pulmonary:     Effort: Pulmonary effort is normal. No respiratory distress.     Breath sounds: Normal breath sounds.   Abdominal:     General: There is no distension.     Tenderness: There is abdominal tenderness in the right lower quadrant. There is no right CVA tenderness, left CVA tenderness, guarding or rebound.     Hernia: No hernia is present.     Comments: Exam limited due to body habitus however tenderness to right lower quadrant. Neg CVA tap  Genitourinary:    Comments: decline Musculoskeletal:        General: Normal range of motion.     Cervical back: Normal range of motion.  Skin:    General: Skin is warm and dry.  Neurological:     General: No focal deficit present.     Mental Status: She is alert.  Psychiatric:        Mood and Affect: Mood normal.    ED Results / Procedures / Treatments   Labs (all labs ordered are listed, but only abnormal results are displayed) Labs Reviewed  CBC - Abnormal; Notable for the following components:      Result Value   WBC 10.6 (*)    All other components within normal limits  LIPASE, BLOOD  COMPREHENSIVE METABOLIC PANEL    EKG None  Radiology CT Abdomen Pelvis W Contrast  Result Date: 08/23/2021 CLINICAL DATA:  Right lower quadrant abdominal pain EXAM: CT ABDOMEN AND PELVIS WITH CONTRAST TECHNIQUE: Multidetector CT imaging of the abdomen and pelvis was performed using the standard protocol following bolus administration of intravenous contrast. RADIATION DOSE REDUCTION: This exam was performed according to the departmental dose-optimization program which includes automated exposure control, adjustment of the mA and/or kV according to patient size and/or use of iterative reconstruction technique. CONTRAST:  149mL OMNIPAQUE IOHEXOL 300 MG/ML  SOLN COMPARISON:  07/14/2008 FINDINGS: Lower chest: Lung bases are clear. Hepatobiliary: Liver is within normal limits. Gallbladder is unremarkable. No intrahepatic or extrahepatic ductal dilatation. Pancreas: Within normal limits. Spleen: Within normal limits. Adrenals/Urinary Tract: Adrenal glands are within  normal limits. Kidneys are within normal limits.  No hydronephrosis. Bladder is within normal limits. Stomach/Bowel: Stomach is within normal limits. No evidence of bowel obstruction. Normal appendix (series 2/image 65). No colonic wall thickening or inflammatory changes. Vascular/Lymphatic: No evidence of abdominal aortic aneurysm. No suspicious abdominopelvic lymphadenopathy. Reproductive: Status post hysterectomy. Suspected left oophorectomy. Prominent right ovary with multiple cysts/follicles measuring up to 2.2 cm (series 2/image 74), within normal limits. Other: No abdominopelvic ascites. Musculoskeletal: Very mild degenerative changes at L5-S1. IMPRESSION: No evidence of bowel obstruction.  Normal appendix. Status post hysterectomy with left oophorectomy. Prominent right ovary with multiple cysts/follicles measuring up to 2.2 cm, within normal limits. In the setting of right lower quadrant pain, consider pelvic ultrasound with Doppler as clinically warranted. Electronically Signed   By: Julian Hy M.D.   On: 08/23/2021 20:37   US PELVIC COMPLETE W TRANSVAGINAL AND TORSION R/O  Result Date: 08/23/2021  CLINICAL DATA:  Initial evaluation for acute pelvic pain. History of prior hysterectomy and left oophorectomy. EXAM: TRANSABDOMINAL AND TRANSVAGINAL ULTRASOUND OF PELVIS DOPPLER ULTRASOUND OF OVARIES TECHNIQUE: Both transabdominal and transvaginal ultrasound examinations of the pelvis were performed. Transabdominal technique was performed for global imaging of the pelvis including uterus, ovaries, adnexal regions, and pelvic cul-de-sac. It was necessary to proceed with endovaginal exam following the transabdominal exam to visualize the ovaries. Color and duplex Doppler ultrasound was utilized to evaluate blood flow to the ovaries. COMPARISON:  CT from earlier the same day. FINDINGS: Uterus Prior hysterectomy.  No abnormality about the vaginal cuff. Endometrium Surgically absent Right ovary Measurements:  3.7 x 2.3 x 3.4 cm = volume: 15.3 mL. 2.5 cm mildly complex cyst, likely a mildly complex follicular cyst or possibly hemorrhagic cyst. Left ovary Not visualized, reportedly surgically absent.  No adnexal mass. Pulsed Doppler evaluation of the right ovary demonstrates normal low-resistance arterial and venous waveforms. Other findings No abnormal free fluid. IMPRESSION: 1. 2.5 cm mildly complex right ovarian cyst, likely a mildly complex follicular cyst or possibly hemorrhagic cyst. While this is almost certainly benign, a short interval follow-up ultrasound in 6-12 weeks to ensure resolution could be performed as warranted. 2. No evidence for right ovarian torsion. 3. Prior hysterectomy and left oophorectomy. No other acute or significant finding. Electronically Signed   By: Jeannine Boga M.D.   On: 08/23/2021 22:11    Procedures Procedures    Medications Ordered in ED Medications  fentaNYL (SUBLIMAZE) injection 50 mcg (50 mcg Intravenous Given 08/23/21 1957)  sodium chloride 0.9 % bolus 1,000 mL (1,000 mLs Intravenous New Bag/Given 08/23/21 1957)  ondansetron (ZOFRAN) injection 4 mg (4 mg Intravenous Given 08/23/21 1957)  iohexol (OMNIPAQUE) 300 MG/ML solution 100 mL (100 mLs Intravenous Contrast Given 08/23/21 2014)    ED Course/ Medical Decision Making/ A&P    36 year old here for evaluation of right lower quadrant pain which began earlier today.  No urinary symptoms.  Prior hysterectomy, left oophorectomy, no vaginal discharge or concern for any STDs.  Seen by PCP earlier today, I reviewed the records.  UA negative for infection, no blood present.  Heart and lungs clear.  Abdomen tenderness to right lower quadrant however limited exam due to body habitus.  Labs and imaging personally reviewed and interpreted: CBC with leukocytosis at 10.6 CMP without significant abnormality Lipase 39 CT AP w right ovarian cyst, recommend ultrasound Ultrasound without torsion however does show complex  right cyst  Reassessed patient.  Pain controlled.  Tolerating p.o.  Discussed labs and imaging.  We will have her follow-up outpatient with OB/GYN.  She will return for new worsening symptoms  Patient does not meet the SIRS or Sepsis criteria.  On repeat exam patient does not have a surgical abdomin and there are no peritoneal signs.  No indication of appendicitis, bowel obstruction, bowel perforation, cholecystitis, diverticulitis, PID, TOA, torsion or ectopic pregnancy.    The patient has been appropriately medically screened and/or stabilized in the ED. I have low suspicion for any other emergent medical condition which would require further screening, evaluation or treatment in the ED or require inpatient management.  Patient is hemodynamically stable and in no acute distress.  Patient able to ambulate in department prior to ED.  Evaluation does not show acute pathology that would require ongoing or additional emergent interventions while in the emergency department or further inpatient treatment.  I have discussed the diagnosis with the patient and answered all questions.  Pain  is been managed while in the emergency department and patient has no further complaints prior to discharge.  Patient is comfortable with plan discussed in room and is stable for discharge at this time.  I have discussed strict return precautions for returning to the emergency department.  Patient was encouraged to follow-up with PCP/specialist refer to at discharge.                           Medical Decision Making Amount and/or Complexity of Data Reviewed External Data Reviewed: labs and notes. Labs: ordered. Decision-making details documented in ED Course. Radiology: ordered and independent interpretation performed. Decision-making details documented in ED Course. ECG/medicine tests: ordered.  Risk OTC drugs. Prescription drug management. Risk Details: Do not feel she needs additional labs, imaging or  hospitalization at this time           Final Clinical Impression(s) / ED Diagnoses Final diagnoses:  Pelvic pain  Right ovarian cyst    Rx / DC Orders ED Discharge Orders          Ordered    ondansetron (ZOFRAN-ODT) 4 MG disintegrating tablet  Every 8 hours PRN        08/23/21 2222    oxyCODONE-acetaminophen (PERCOCET/ROXICET) 5-325 MG tablet  Every 6 hours PRN        08/23/21 2222              Delmas Faucett A, PA-C 08/23/21 2226    Wyvonnia Dusky, MD 08/23/21 2316

## 2021-08-23 NOTE — Assessment & Plan Note (Signed)
Acute pain, started this morning in RLQ that radiates to back and across lower abdomen. Positive McBurney's Point tenderness and Rosving's sign. U/A only shows trace leukocytes, no blood. Less likely kidney stone. High suspicion for appendicitis. Possibly ovarian cyst. She has a history of hysterectomy and left ovary removed, however right ovary still intact. Recommend emergency room evaluation. She agrees with this plan. Declines transport via EMS.

## 2021-08-24 ENCOUNTER — Telehealth: Payer: Self-pay

## 2021-08-24 NOTE — Telephone Encounter (Signed)
Transition Care Management Follow-up Telephone Call Date of discharge and from where: 08/23/2021 from [facility] How have you been since you were released from the hospital? Patient stated that she is feeling some better.  Any questions or concerns? No  Items Reviewed: Did the pt receive and understand the discharge instructions provided? Yes  Medications obtained and verified? Yes  Other? No  Any new allergies since your discharge? No  Dietary orders reviewed? No Do you have support at home? Yes   Functional Questionnaire: (I = Independent and D = Dependent) ADLs: I  Bathing/Dressing- I  Meal Prep- I  Eating- I  Maintaining continence- I  Transferring/Ambulation- I  Managing Meds- I   Follow up appointments reviewed:  PCP Hospital f/u appt confirmed? No   Specialist Hospital f/u appt confirmed? No  Patient is calling OBGYN for a follow up appt.  Are transportation arrangements needed? No  If their condition worsens, is the pt aware to call PCP or go to the Emergency Dept.? Yes Was the patient provided with contact information for the PCP's office or ED? Yes Was to pt encouraged to call back with questions or concerns? Yes

## 2021-08-30 ENCOUNTER — Encounter: Payer: Self-pay | Admitting: Family Medicine

## 2021-08-30 ENCOUNTER — Ambulatory Visit (INDEPENDENT_AMBULATORY_CARE_PROVIDER_SITE_OTHER): Payer: BC Managed Care – PPO | Admitting: Family Medicine

## 2021-08-30 ENCOUNTER — Other Ambulatory Visit: Payer: Self-pay

## 2021-08-30 VITALS — BP 128/74 | HR 94 | Temp 98.6°F | Ht 67.0 in | Wt 292.0 lb

## 2021-08-30 DIAGNOSIS — J040 Acute laryngitis: Secondary | ICD-10-CM

## 2021-08-30 MED ORDER — PREDNISONE 10 MG PO TABS: 10.0000 mg | ORAL_TABLET | Freq: Two times a day (BID) | ORAL | 0 refills | Status: AC

## 2021-08-30 NOTE — Progress Notes (Signed)
Established Patient Office Visit  Subjective:  Patient ID: Michelle Kane, female    DOB: [birth year]  Age: 36 y.o. MRN: [id number]  CC:  Chief Complaint  Patient presents with   Hoarse    Hoarse voice x 2 days. No cough or soar throat.    HPI Michelle Kane presents for evaluation of a 1 day history of hoarseness.  Patient denies recent fevers chills nasal congestion postnasal drip cough sneezing itchy watery eyes ears nose or throat.  She does have a history of allergy rhinitis associated with reactive airway disease that is currently well controlled with her current regimen.  History of GERD that is well controlled with Protonix.  She does work in a call center.  Denies excessive voice use such as singing and so forth.  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 EF 40-45% in 2016, varying by several echoes. F/u echo 06/2015 showed EF 50%.    PONV (postoperative nausea and vomiting)    Sinus tachycardia    a. 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 b. 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    Past Surgical History:  Procedure Laterality Date   ABDOMINAL HYSTERECTOMY     CESAREAN SECTION  03-01-2008  and 2016   IM PINNING RIGHT ELBOW FX  1992   HARDWARE REMOVED   LAPAROSCOPIC ASSISTED VAGINAL HYSTERECTOMY Left 02/04/2017   Procedure: LAPAROSCOPIC ASSISTED VAGINAL HYSTERECTOMY, LEFT SALPINGOOPHORECTOMY;  Surgeon:  Dian Queen, MD;  Location: [facility];  Service: Gynecology;  Laterality: Left;  NEED BED   LAPAROSCOPY N/A 07/29/2014   Procedure: LAPAROSCOPY DIAGNOSTIC;  Surgeon: Cyril Mourning, MD;  Location: [facility]  Service: Gynecology;  Laterality: N/A;   OOPHORECTOMY Left 02/04/2017   Procedure: LEFT OOPHORECTOMY;  Surgeon: Dian Queen, MD;  [facility];  Service: Gynecology;  Laterality: Left;    Family History  Problem Relation Age of Onset   Asthma Mother    Crohn's disease Mother    Colon polyps Mother    Allergic rhinitis Mother    Heart disease Father        heart failure   Hyperlipidemia Father    Hemophilia Father    COPD Father        smoked   Urticaria Father    Allergic rhinitis Sister    Heart failure Brother    Allergic rhinitis Daughter    Hemophilia Son    Allergic rhinitis Son    Food Allergy Son    Heart failure Paternal Aunt    Heart failure Paternal Uncle    COPD Maternal Grandmother        smoked   Lung cancer Maternal Grandmother        smoked   Cancer Maternal Grandfather  skin   Heart attack Maternal Grandfather    Diabetes Paternal Grandfather    Stroke Paternal Grandfather    Asthma Niece    Hypertension Neg Hx    Eczema Neg Hx    Angioedema Neg Hx    Atopy Neg Hx    Immunodeficiency Neg Hx     Social History   Socioeconomic History   Marital status: Married    Spouse name: Not on file   Number of children: Not on file   Years of education: Not on file   Highest education level: Not on file  Occupational History   Occupation: CNA   Tobacco Use   Smoking status: Former    Packs/day: 0.50    Years: 10.00    Pack years: 5.00    Types: Cigarettes   Smokeless tobacco: Never  Vaping Use   Vaping Use: Some days  Substance and Sexual Activity   Alcohol use: Not Currently   Drug use: No   Sexual activity: Yes    Birth  control/protection: Surgical  Other Topics Concern   Not on file  Social History Narrative   Not on file   Social Determinants of Health   Financial Resource Strain: Not on file  Food Insecurity: Not on file  Transportation Needs: Not on file  Physical Activity: Not on file  Stress: Not on file  Social Connections: Not on file  Intimate Partner Violence: Not on file    Outpatient Medications Prior to Visit  Medication Sig Dispense Refill   albuterol (VENTOLIN HFA) 108 (90 Base) MCG/ACT inhaler Inhale 2 puffs into the lungs every 4 (four) hours as needed for wheezing or shortness of breath. 18 g 1   azelastine (ASTELIN) 0.1 % nasal spray Place 1-2 sprays into both nostrils 2 (two) times daily as needed. 30 mL 5   budesonide-formoterol (SYMBICORT) 160-4.5 MCG/ACT inhaler 2 puffs twice daily with spacer to prevent coughing or wheezing. Rinse,gargle and spit after use. 1 Inhaler 5   fexofenadine (ALLEGRA) 60 MG tablet Take 60 mg by mouth 2 (two) times daily.     Fluticasone Propionate (XHANCE) 93 MCG/ACT EXHU 2 sprays per nostril twice a day if needed for stuffy nose. 32 mL 5   levocetirizine (XYZAL) 5 MG tablet Take 5 mg by mouth in the morning and at bedtime.     LORazepam (ATIVAN) 1 MG tablet Take 1 mg by mouth every 6 (six) hours as needed for anxiety.     pantoprazole (PROTONIX) 40 MG tablet Take 1 tablet (40 mg total) by mouth daily. Take 30-60 min before first meal of the day 90 tablet 3   benzonatate (TESSALON) 200 MG capsule Take 1 capsule (200 mg total) by mouth 3 (three) times daily as needed for cough. (Patient not taking: Reported on 08/30/2021) 21 capsule 0   ondansetron (ZOFRAN-ODT) 4 MG disintegrating tablet Take 1 tablet (4 mg total) by mouth every 8 (eight) hours as needed for nausea or vomiting. 20 tablet 0   oxyCODONE-acetaminophen (PERCOCET/ROXICET) 5-325 MG tablet Take 1 tablet by mouth every 6 (six) hours as needed for severe pain. 15 tablet 0   No  facility-administered medications prior to visit.    Allergies  Allergen Reactions   Buspar [Buspirone] Other (See Comments)    Mouth, lips tingling, throat tightness, chest pain   Vicodin [Hydrocodone-Acetaminophen] Itching, Nausea And Vomiting and Other (See Comments)    GI pains also   Labetalol     Causes congestive heart failure  Zyrtec [Cetirizine]     Worsening anxiety    ROS Review of Systems  Constitutional:  Negative for chills, diaphoresis, fatigue, fever and unexpected weight change.  HENT:  Positive for voice change. Negative for congestion, postnasal drip, sinus pressure, sinus pain, sneezing and sore throat.   Eyes:  Negative for photophobia and visual disturbance.  Respiratory:  Negative for chest tightness, shortness of breath and wheezing.   Cardiovascular: Negative.   Gastrointestinal: Negative.   Musculoskeletal:  Negative for arthralgias and myalgias.  Neurological:  Negative for speech difficulty and headaches.     Objective:    Physical Exam Vitals and nursing note reviewed.  Constitutional:      General: She is not in acute distress.    Appearance: Normal appearance. She is not ill-appearing, toxic-appearing or diaphoretic.  HENT:     Head: Normocephalic and atraumatic.     Right Ear: Tympanic membrane, ear canal and external ear normal.     Left Ear: Tympanic membrane, ear canal and external ear normal.     Mouth/Throat:     Mouth: Mucous membranes are moist.     Pharynx: Oropharynx is clear. No oropharyngeal exudate.  Eyes:     General: No scleral icterus.       Right eye: No discharge.        Left eye: No discharge.     Conjunctiva/sclera: Conjunctivae normal.     Pupils: Pupils are equal, round, and reactive to light.  Cardiovascular:     Rate and Rhythm: Normal rate and regular rhythm.  Pulmonary:     Effort: Pulmonary effort is normal.     Breath sounds: Normal breath sounds.  Musculoskeletal:     Cervical back: No rigidity or  tenderness.  Lymphadenopathy:     Cervical: No cervical adenopathy.  Skin:    General: Skin is warm and dry.  Neurological:     Mental Status: She is alert and oriented to person, place, and time.  Psychiatric:        Mood and Affect: Mood normal.        Behavior: Behavior normal.    BP 128/74 (BP Location: Right Arm, Patient Position: Sitting, Cuff Size: Large)    Pulse 94    Temp 98.6 F (37 C) (Temporal)    Ht 5\' 7"  (1.702 m)    Wt 292 lb (132.5 kg)    LMP 12/09/2016    BMI 45.73 kg/m  Wt Readings from Last 3 Encounters:  08/30/21 292 lb (132.5 kg)  08/23/21 292 lb (132.5 kg)  12/28/20 280 lb (127 kg)     There are no preventive care reminders to display for this patient.  There are no preventive care reminders to display for this patient.  Lab Results  Component Value Date   TSH 0.89 08/21/2020   Lab Results  Component Value Date   WBC 10.6 (H) 08/23/2021   HGB 13.8 08/23/2021   HCT 40.4 08/23/2021   MCV 90.8 08/23/2021   PLT 238 08/23/2021   Lab Results  Component Value Date   NA 137 08/23/2021   K 3.7 08/23/2021   CO2 24 08/23/2021   GLUCOSE 99 08/23/2021   BUN 7 08/23/2021   CREATININE 0.65 08/23/2021   BILITOT 0.6 08/23/2021   ALKPHOS 59 08/23/2021   AST 24 08/23/2021   ALT 33 08/23/2021   PROT 6.9 08/23/2021   ALBUMIN 3.8 08/23/2021   CALCIUM 8.9 08/23/2021   ANIONGAP 9 08/23/2021   GFR 115.70 08/21/2020  Lab Results  Component Value Date   CHOL 202 (H) 08/21/2020   Lab Results  Component Value Date   HDL 37.10 (L) 08/21/2020   Lab Results  Component Value Date   LDLCALC 140 (H) 08/21/2020   Lab Results  Component Value Date   TRIG 126.0 08/21/2020   Lab Results  Component Value Date   CHOLHDL 5 08/21/2020   Lab Results  Component Value Date   HGBA1C 5.0 08/21/2020      Assessment & Plan:   Problem List Items Addressed This Visit       Respiratory   Laryngitis - Primary   Relevant Medications   predniSONE (DELTASONE)  10 MG tablet    Meds ordered this encounter  Medications   predniSONE (DELTASONE) 10 MG tablet    Sig: Take 1 tablet (10 mg total) by mouth 2 (two) times daily with a meal for 5 days.    Dispense:  10 tablet    Refill:  0    Follow-up: Return in about 1 week (around 09/06/2021), or if symptoms worsen or fail to improve, for Out of work through Sunday.Libby Maw, MD

## 2021-09-03 NOTE — Progress Notes (Signed)
BP 132/88 (BP Location: Right Arm, Patient Position: Sitting, Cuff Size: Large)    Pulse 83    Temp (!) 97 F (36.1 C) (Temporal)    Ht 5\' 7"  (1.702 m)    Wt 296 lb 12.8 oz (134.6 kg)    LMP 12/09/2016    SpO2 99%    BMI 46.49 kg/m    Subjective:    Patient ID: Michelle Kane, female    DOB: [birth year], 36 y.o.   MRN: [id number]  CC: Chief Complaint  Patient presents with   Annual Exam    CPE.     HPI: Michelle Kane is a 36 y.o. female presenting on 09/04/2021 for comprehensive medical examination. Current medical complaints include: RLQ abdominal pain and urinary incontinence.  She went to the ER on 08/23/21 after recommendation from office visit with severe RLQ abdominal pain. CT was completed which did not show appendicitis, but did show multiple cysts on her right ovary. She has an appointment with GYN tomorrow. She states that the pain has improved since it first started and it is intermittent now. She takes ibuprofen/tylenol to help with the pain.   She has also noticed worsening in her "weak bladder" for the last 6 months. She states that she dribbles urine when she coughs or laughs. She has started to do kegel exercises daily for the last few days. She denies dysuria, urgency, and frequency.   Depression Screen done today and results listed below:  Depression screen [facility] 2/9 09/04/2021 08/30/2021 08/21/2020 10/14/2019 09/15/2017  Decreased Interest 0 0 0 0 1  Down, Depressed, Hopeless 0 0 0 0 0  PHQ - 2 Score 0 0 0 0 1    The patient does not have a history of falls. I did not complete a risk assessment for falls. A plan of care for falls was not documented.   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 EF 40-45% in 2016, varying by several echoes. F/u echo 06/2015 showed EF 50%.    PONV (postoperative nausea and vomiting)    Sinus tachycardia    a. 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 b. 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    Surgical History:  Past Surgical History:  Procedure Laterality Date   ABDOMINAL HYSTERECTOMY     CESAREAN SECTION  03-01-2008  and 2016   IM PINNING RIGHT ELBOW FX  1992   HARDWARE REMOVED   LAPAROSCOPIC ASSISTED VAGINAL HYSTERECTOMY Left 02/04/2017   Procedure: LAPAROSCOPIC ASSISTED VAGINAL HYSTERECTOMY, LEFT SALPINGOOPHORECTOMY;  Surgeon: Dian Queen, MD;  Location: [facility];  Service: Gynecology;  Laterality: Left;  NEED BED   LAPAROSCOPY N/A 07/29/2014   Procedure: LAPAROSCOPY DIAGNOSTIC;  Surgeon: Cyril Mourning, MD;  Location: [facility];  Service: Gynecology;  Laterality: N/A;   OOPHORECTOMY Left 02/04/2017   Procedure: LEFT OOPHORECTOMY;  Surgeon: Dian Queen, MD;  Location: [facility];  Service: Gynecology;  Laterality: Left;    Medications:  Current Outpatient Medications on File Prior to Visit  Medication Sig   albuterol (VENTOLIN HFA) 108 (90 Base) MCG/ACT inhaler Inhale 2 puffs into the lungs every 4 (four) hours as needed for wheezing or shortness of breath.   azelastine (ASTELIN) 0.1 % nasal spray Place 1-2 sprays into both nostrils 2 (two) times daily as needed.   fexofenadine (ALLEGRA) 60 MG tablet Take 60 mg by mouth 2 (two) times daily.   Fluticasone Propionate (XHANCE) 93 MCG/ACT EXHU 2 sprays per nostril twice a day if needed for stuffy nose.   levocetirizine (XYZAL) 5 MG tablet  Take 5 mg by mouth in the morning and at bedtime.   LORazepam (ATIVAN) 1 MG tablet Take 1 mg by mouth every 6 (six) hours as needed for anxiety.   predniSONE (DELTASONE) 10 MG tablet Take 1 tablet (10 mg total) by mouth 2 (two) times daily with a meal for 5 days.   No current facility-administered medications on file prior to visit.    Allergies:  Allergies  Allergen Reactions   Buspar [Buspirone] Other (See Comments)    Mouth, lips tingling, throat tightness, chest pain   Vicodin [Hydrocodone-Acetaminophen] Itching, Nausea And Vomiting and Other (See Comments)    GI pains also   Labetalol     Causes congestive heart failure   Zyrtec [Cetirizine]     Worsening anxiety    Social History:  Social History   Socioeconomic History   Marital status: Married    Spouse name: Not on file   Number of children: Not on file   Years of education: Not on file   Highest education level: Not on file  Occupational History   Occupation: CNA   Tobacco Use   Smoking status: Former    Packs/day: 0.50    Years: 10.00    Pack years: 5.00    Types: Cigarettes   Smokeless tobacco: Never  Vaping Use   Vaping Use: Some days  Substance and Sexual Activity   Alcohol use: Not Currently   Drug use: No   Sexual activity: Yes    Birth control/protection: Surgical  Other Topics Concern   Not on file  Social History Narrative   Not on file   Social Determinants of Health   Financial Resource Strain: Not on file  Food Insecurity: Not on file  Transportation Needs: Not on file  Physical Activity: Not on file  Stress: Not on file  Social Connections: Not on file  Intimate Partner Violence: Not on file   Social History   Tobacco Use  Smoking Status Former   Packs/day: 0.50   Years: 10.00   Pack years: 5.00   Types: Cigarettes  Smokeless Tobacco Never   Social History   Substance and Sexual Activity  Alcohol Use Not Currently    Family History:  Family History  Problem Relation  Age of Onset   Asthma Mother    Crohn's disease Mother    Colon polyps Mother    Allergic rhinitis Mother    Heart disease Father        heart failure   Hyperlipidemia Father    Hemophilia Father    COPD Father        smoked   Urticaria Father    Allergic rhinitis Sister    Heart  failure Brother    Allergic rhinitis Daughter    Hemophilia Son    Allergic rhinitis Son    Food Allergy Son    Heart failure Paternal Aunt    Heart failure Paternal Uncle    COPD Maternal Grandmother        smoked   Lung cancer Maternal Grandmother        smoked   Cancer Maternal Grandfather        skin   Heart attack Maternal Grandfather    Diabetes Paternal Grandfather    Stroke Paternal Grandfather    Asthma Niece    Hypertension Neg Hx    Eczema Neg Hx    Angioedema Neg Hx    Atopy Neg Hx    Immunodeficiency Neg Hx     Past medical history, surgical history, medications, allergies, family history and social history reviewed with patient today and changes made to appropriate areas of the chart.   Review of Systems  Constitutional:  Positive for malaise/fatigue.  HENT:         Hoarse voice for 7 days  Eyes: Negative.   Respiratory: Negative.    Cardiovascular: Negative.   Gastrointestinal: Negative.   Genitourinary:  Negative for frequency and urgency.       Leaking urine  Musculoskeletal:  Positive for joint pain (chronic knee pain).  Skin: Negative.   Neurological: Negative.   Psychiatric/Behavioral:  The patient is nervous/anxious and has insomnia.   All other ROS negative except what is listed above and in the HPI.      Objective:    BP 132/88 (BP Location: Right Arm, Patient Position: Sitting, Cuff Size: Large)    Pulse 83    Temp (!) 97 F (36.1 C) (Temporal)    Ht 5\' 7"  (1.702 m)    Wt 296 lb 12.8 oz (134.6 kg)    LMP 12/09/2016    SpO2 99%    BMI 46.49 kg/m   Wt Readings from Last 3 Encounters:  09/04/21 296 lb 12.8 oz (134.6 kg)  08/30/21 292 lb (132.5 kg)  08/23/21  292 lb (132.5 kg)    Physical Exam Vitals and nursing note reviewed.  Constitutional:      General: She is not in acute distress.    Appearance: Normal appearance. She is obese.  HENT:     Head: Normocephalic and atraumatic.     Right Ear: Tympanic membrane, ear canal and external ear normal.     Left Ear: Tympanic membrane, ear canal and external ear normal.     Nose: Nose normal.     Mouth/Throat:     Mouth: Mucous membranes are moist.     Pharynx: Oropharynx is clear.  Eyes:     Conjunctiva/sclera: Conjunctivae normal.  Cardiovascular:     Rate and Rhythm: Normal rate and regular rhythm.     Pulses: Normal pulses.     Heart sounds: Normal heart sounds.  Pulmonary:     Effort: Pulmonary effort is normal.     Breath sounds: Normal breath sounds.  Abdominal:     General: Bowel sounds are normal.     Palpations: Abdomen is soft.     Tenderness: There is no abdominal tenderness.  Musculoskeletal:        General: Normal range of motion.     Cervical back: Normal range of motion. No tenderness.     Right lower leg: No edema.     Left lower leg: No edema.  Lymphadenopathy:  Cervical: No cervical adenopathy.  Skin:    General: Skin is warm and dry.  Neurological:     General: No focal deficit present.     Mental Status: She is alert and oriented to person, place, and time.     Cranial Nerves: No cranial nerve deficit.     Coordination: Coordination normal.     Gait: Gait normal.  Psychiatric:        Mood and Affect: Mood normal.        Behavior: Behavior normal.        Thought Content: Thought content normal.        Judgment: Judgment normal.    Results for orders placed or performed during the hospital encounter of 08/23/21  Lipase, blood  Result Value Ref Range   Lipase 39 11 - 51 U/L  Comprehensive metabolic panel  Result Value Ref Range   Sodium 137 135 - 145 mmol/L   Potassium 3.7 3.5 - 5.1 mmol/L   Chloride 104 98 - 111 mmol/L   CO2 24 22 - 32 mmol/L    Glucose, Bld 99 70 - 99 mg/dL   BUN 7 6 - 20 mg/dL   Creatinine, Ser 0.65 0.44 - 1.00 mg/dL   Calcium 8.9 8.9 - 10.3 mg/dL   Total Protein 6.9 6.5 - 8.1 g/dL   Albumin 3.8 3.5 - 5.0 g/dL   AST 24 15 - 41 U/L   ALT 33 0 - 44 U/L   Alkaline Phosphatase 59 38 - 126 U/L   Total Bilirubin 0.6 0.3 - 1.2 mg/dL   GFR, Estimated >60 >60 mL/min   Anion gap 9 5 - 15  CBC  Result Value Ref Range   WBC 10.6 (H) 4.0 - 10.5 K/uL   RBC 4.45 3.87 - 5.11 MIL/uL   Hemoglobin 13.8 12.0 - 15.0 g/dL   HCT 40.4 36.0 - 46.0 %   MCV 90.8 80.0 - 100.0 fL   MCH 31.0 26.0 - 34.0 pg   MCHC 34.2 30.0 - 36.0 g/dL   RDW 12.1 11.5 - 15.5 %   Platelets 238 150 - 400 K/uL   nRBC 0.0 0.0 - 0.2 %      Assessment & Plan:   Problem List Items Addressed This Visit       Cardiovascular and Mediastinum   Essential hypertension    Chronic, stable. BP 132/88 today. Discussed limiting salt and exercise. Check blood pressure a few times a week at home and call if >140/90 for several days in a row.       CHF (congestive heart failure), NYHA class I, chronic, systolic ([place])    Occurred during her pregnancy. Last EF was 50-55% on 01/12/18. She is euvolemic on exam. She limits the amount of fluids she drinks during the day. She is currently not on any medications. Follow up with any concerns.         Respiratory   Moderate persistent asthma    Chronic, stable. She takes symbicort daily and her symptoms are well controlled. She only uses albuterol intermittently and not often if she takes her symbicort. She also takes fluticasone, allegra, and xzyal to help with allergies. Symbicort refill sent to the pharmacy.       Relevant Medications   budesonide-formoterol (SYMBICORT) 160-4.5 MCG/ACT inhaler   Laryngitis    Start last Tuesday and she was started on prednisone. She had no other symptoms besides loss of her voice. Currently, it is hoarse and tends to get worse  as the day goes on. She has been out of work since last  Tuesday and needs forms filled out for leave. Will fill this out when she brings it. If symptoms are ongoing, will place referral to ENT. Call or send mychart message in 1 week if still having hoarseness.         Digestive   GERD (gastroesophageal reflux disease)    Chronic, stable. Symptoms well controlled on protonix daily and avoiding triggers. Continue protonix, no refills needed today.       Relevant Medications   pantoprazole (PROTONIX) 40 MG tablet     Other   Anxiety state    She endorses a history of anxiety. She follows with Dr. Buddy Duty with psychiatry. She also endorses trouble sleeping due to her mind racing. She has tried [place], trazodone, buspar, zoloft, lexapro, cymbalta, and melatonin. She does have a prescription for lorazepam as needed. Discussed non-pharmacological ways to help with anxiety and she usually listens to music, watches a TV show, or reads a book. Continue collaboration and recommendations from psychiatry.       Morbid obesity (HCC)    BMI 46. She has been trying to watch what she is eating. Information given on diet and exercise. Goal is to lose 1-2 pounds per week.       Pure hypercholesterolemia    Chronic, last cholesterol panel showed total cholesterol at 202 and LDL 140. Discussed diet and exercise. Will check lipid panel with next lab draw.       Right lower quadrant abdominal pain    Pain has improved, she went to ER on 08/23/21 where she was diagnosed with ovarian cysts. She has an appointment with GYN tomorrow. Keep this appointment and will await recommendations from GYN.       Stress incontinence    Ongoing for the last 6 months. Discussed doing kegel exercises to strengthen her pelvic floor muscles 10 times an hour while awake. Also discussed if ongoing, there are medications to trial and pelvic floor PT. Reach out if symptoms worsen or for any concerns.       Other Visit Diagnoses     Routine general medical examination at a health care  facility    -  Primary   Health maintenance reviewed and updated. CMP and CBC reviewed from ER on 08/23/21        Follow up plan: Return in about 1 year (around 09/04/2022) for CPE.   LABORATORY TESTING:  - Pap smear: not applicable  IMMUNIZATIONS:   - Tdap: Tetanus vaccination status reviewed: last tetanus booster within 10 years. - Influenza: Refused - Pneumovax: Not applicable - Prevnar: Not applicable - HPV: Not applicable - Zostavax vaccine: Not applicable  SCREENING: -Mammogram: Not applicable  - Colonoscopy: Not applicable  - Bone Density: Not applicable  -Hearing Test: Not applicable  -Spirometry: Not applicable   PATIENT COUNSELING:   Advised to take 1 mg of folate supplement per day if capable of pregnancy.   Sexuality: Discussed sexually transmitted diseases, partner selection, use of condoms, avoidance of unintended pregnancy  and contraceptive alternatives.   Advised to avoid cigarette smoking.  I discussed with the patient that most people either abstain from alcohol or drink within safe limits (<=14/week and <=4 drinks/occasion for males, <=7/weeks and <= 3 drinks/occasion for females) and that the risk for alcohol disorders and other health effects rises proportionally with the number of drinks per week and how often a drinker exceeds daily limits.  Discussed cessation/primary  prevention of drug use and availability of treatment for abuse.   Diet: Encouraged to adjust caloric intake to maintain  or achieve ideal body weight, to reduce intake of dietary saturated fat and total fat, to limit sodium intake by avoiding high sodium foods and not adding table salt, and to maintain adequate dietary potassium and calcium preferably from fresh fruits, vegetables, and low-fat dairy products.    stressed the importance of regular exercise  Injury prevention: Discussed safety belts, safety helmets, smoke detector, smoking near bedding or upholstery.   Dental health:  Discussed importance of regular tooth brushing, flossing, and dental visits.    NEXT PREVENTATIVE PHYSICAL DUE IN 1 YEAR. Return in about 1 year (around 09/04/2022) for CPE.

## 2021-09-04 ENCOUNTER — Encounter: Payer: Self-pay | Admitting: Nurse Practitioner

## 2021-09-04 ENCOUNTER — Ambulatory Visit (INDEPENDENT_AMBULATORY_CARE_PROVIDER_SITE_OTHER): Payer: BC Managed Care – PPO | Admitting: Nurse Practitioner

## 2021-09-04 ENCOUNTER — Other Ambulatory Visit: Payer: Self-pay

## 2021-09-04 VITALS — BP 132/88 | HR 83 | Temp 97.0°F | Ht 67.0 in | Wt 296.8 lb

## 2021-09-04 DIAGNOSIS — I10 Essential (primary) hypertension: Secondary | ICD-10-CM

## 2021-09-04 DIAGNOSIS — R1031 Right lower quadrant pain: Secondary | ICD-10-CM

## 2021-09-04 DIAGNOSIS — F411 Generalized anxiety disorder: Secondary | ICD-10-CM

## 2021-09-04 DIAGNOSIS — Z0000 Encounter for general adult medical examination without abnormal findings: Secondary | ICD-10-CM | POA: Diagnosis not present

## 2021-09-04 DIAGNOSIS — J4540 Moderate persistent asthma, uncomplicated: Secondary | ICD-10-CM | POA: Diagnosis not present

## 2021-09-04 DIAGNOSIS — N393 Stress incontinence (female) (male): Secondary | ICD-10-CM

## 2021-09-04 DIAGNOSIS — K219 Gastro-esophageal reflux disease without esophagitis: Secondary | ICD-10-CM

## 2021-09-04 DIAGNOSIS — I5022 Chronic systolic (congestive) heart failure: Secondary | ICD-10-CM

## 2021-09-04 DIAGNOSIS — E7800 Pure hypercholesterolemia, unspecified: Secondary | ICD-10-CM

## 2021-09-04 DIAGNOSIS — J040 Acute laryngitis: Secondary | ICD-10-CM

## 2021-09-04 MED ORDER — BUDESONIDE-FORMOTEROL FUMARATE 160-4.5 MCG/ACT IN AERO: INHALATION_SPRAY | RESPIRATORY_TRACT | 5 refills | Status: DC

## 2021-09-04 MED ORDER — PANTOPRAZOLE SODIUM 40 MG PO TBEC
40.0000 mg | DELAYED_RELEASE_TABLET | Freq: Every day | ORAL | 3 refills | Status: DC
Start: 2021-09-04 — End: 2022-09-05

## 2021-09-04 NOTE — Assessment & Plan Note (Signed)
Chronic, stable. Symptoms well controlled on protonix daily and avoiding triggers. Continue protonix, no refills needed today.

## 2021-09-04 NOTE — Assessment & Plan Note (Signed)
Occurred during her pregnancy. Last EF was 50-55% on 01/12/18. She is euvolemic on exam. She limits the amount of fluids she drinks during the day. She is currently not on any medications. Follow up with any concerns.  

## 2021-09-04 NOTE — Assessment & Plan Note (Addendum)
She endorses a history of anxiety. She follows with Dr. Buddy Duty with psychiatry. She also endorses trouble sleeping due to her mind racing. She has tried [place], trazodone, buspar, zoloft, lexapro, cymbalta, and melatonin. She does have a prescription for lorazepam as needed. Discussed non-pharmacological ways to help with anxiety and she usually listens to music, watches a TV show, or reads a book. Continue collaboration and recommendations from psychiatry.

## 2021-09-04 NOTE — Assessment & Plan Note (Signed)
Pain has improved, she went to ER on 08/23/21 where she was diagnosed with ovarian cysts. She has an appointment with GYN tomorrow. Keep this appointment and will await recommendations from GYN.

## 2021-09-04 NOTE — Assessment & Plan Note (Signed)
Ongoing for the last 6 months. Discussed doing kegel exercises to strengthen her pelvic floor muscles 10 times an hour while awake. Also discussed if ongoing, there are medications to trial and pelvic floor PT. Reach out if symptoms worsen or for any concerns.

## 2021-09-04 NOTE — Assessment & Plan Note (Signed)
Chronic, last cholesterol panel showed total cholesterol at 202 and LDL 140. Discussed diet and exercise. Will check lipid panel with next lab draw.

## 2021-09-04 NOTE — Patient Instructions (Signed)
It was great to see you!  Start doing kegel exercises and pelvic floor stretches to help with your bladder. If that doesn't help your symptoms, reach out and we can try medications.   Let's follow-up in 1 year, sooner if you have concerns.  If a referral was placed today, you will be contacted for an appointment. Please note that routine referrals can sometimes take up to 3-4 weeks to process. Please call our office if you haven't heard anything after this time frame.  Take care,  Vance Peper, NP

## 2021-09-04 NOTE — Assessment & Plan Note (Signed)
Chronic, stable. BP 132/88 today. Discussed limiting salt and exercise. Check blood pressure a few times a week at home and call if >140/90 for several days in a row.

## 2021-09-04 NOTE — Assessment & Plan Note (Signed)
Start last Tuesday and she was started on prednisone. She had no other symptoms besides loss of her voice. Currently, it is hoarse and tends to get worse as the day goes on. She has been out of work since last Tuesday and needs forms filled out for leave. Will fill this out when she brings it. If symptoms are ongoing, will place referral to ENT. Call or send mychart message in 1 week if still having hoarseness.

## 2021-09-04 NOTE — Assessment & Plan Note (Signed)
BMI 46. She has been trying to watch what she is eating. Information given on diet and exercise. Goal is to lose 1-2 pounds per week.

## 2021-09-04 NOTE — Assessment & Plan Note (Signed)
Chronic, stable. She takes symbicort daily and her symptoms are well controlled. She only uses albuterol intermittently and not often if she takes her symbicort. She also takes fluticasone, allegra, and xzyal to help with allergies. Symbicort refill sent to the pharmacy.

## 2021-09-10 ENCOUNTER — Telehealth: Payer: Self-pay

## 2021-09-10 NOTE — Telephone Encounter (Signed)
Called and patient clarified dates she was out of work. Patient to come

## 2021-10-10 ENCOUNTER — Ambulatory Visit: Payer: BC Managed Care – PPO | Admitting: Surgery

## 2021-10-18 ENCOUNTER — Ambulatory Visit (INDEPENDENT_AMBULATORY_CARE_PROVIDER_SITE_OTHER): Payer: BC Managed Care – PPO | Admitting: Surgery

## 2021-10-18 ENCOUNTER — Encounter: Payer: Self-pay | Admitting: Surgery

## 2021-10-18 ENCOUNTER — Ambulatory Visit: Payer: Self-pay

## 2021-10-18 VITALS — BP 163/105 | HR 76 | Ht 67.0 in | Wt 296.8 lb

## 2021-10-18 DIAGNOSIS — M545 Low back pain, unspecified: Secondary | ICD-10-CM | POA: Diagnosis not present

## 2021-10-18 DIAGNOSIS — M5136 Other intervertebral disc degeneration, lumbar region: Secondary | ICD-10-CM

## 2021-10-18 MED ORDER — METHOCARBAMOL 500 MG PO TABS: 500.0000 mg | ORAL_TABLET | Freq: Three times a day (TID) | ORAL | 0 refills | Status: DC | PRN

## 2021-10-18 MED ORDER — METHYLPREDNISOLONE 4 MG PO TABS: ORAL_TABLET | ORAL | 0 refills | Status: DC

## 2021-10-18 NOTE — Progress Notes (Signed)
Office Visit Note   Patient: Michelle Kane           Date of Birth: 1985/11/25           MRN: [id number] Visit Date: 10/18/2021              Requested by: Charyl Dancer, NP [place],  [place] [address] PCP: Charyl Dancer, NP   Assessment & Plan: Visit Diagnoses:  1. Acute low back pain without sciatica, unspecified back pain laterality   2. DDD (degenerative disc disease), lumbar     Plan: Prescription sent in for prednisone 6-day taper to be taken as directed and Robaxin.  Patient will continue home excise program.  Follow-up in 3 weeks for recheck.  If she is not having any improvement she will call me 1 week before appointment and I will go ahead and schedule MRI lumbar spine.  Follow-Up Instructions: Return in about 3 weeks (around 11/08/2021) for with Kyion Gautier recheck back pain.   Orders:  Orders Placed This Encounter  Procedures   XR Lumbar Spine 2-3 Views   Meds ordered this encounter  Medications   methylPREDNISolone (MEDROL) 4 MG tablet    Sig: 6 day taper to be taken as directed    Dispense:  21 tablet    Refill:  0   methocarbamol (ROBAXIN) 500 MG tablet    Sig: Take 1 tablet (500 mg total) by mouth every 8 (eight) hours as needed for muscle spasms.    Dispense:  30 tablet    Refill:  0      Procedures: No procedures performed   Clinical Data: No additional findings.   Subjective: Chief Complaint  Patient presents with   Lower Back - Pain    HPI 36 year old female comes in with complaints of back pain.  Back pain off and on for about a year.  No specific injury.  Patient has gone to formal PT ordered by her PCP.  No lower extremity radicular symptoms.  When grocery shopping she does have to lean over a cart to help take some the stress of her back. Review of Systems No current complaints of cardiopulmonary GI/GU issues  Objective: Vital Signs: BP (!) 163/105   Pulse 76   Ht '5\' 7"'$  (1.702 m)   Wt 296 lb 12.8 oz (134.6  kg)   LMP 12/09/2016   BMI 46.49 kg/m   Physical Exam HENT:     Head: Normocephalic and atraumatic.     Nose: Nose normal.  Pulmonary:     Effort: No respiratory distress.  Musculoskeletal:     Comments: Patient has lumbar paraspinal tenderness/spasm.  Positive sciatic notch tenderness.  Negative logroll.  Negative straight leg raise.  No focal motor deficits.  Neurological:     General: No focal deficit present.     Mental Status: She is alert and oriented to person, place, and time.     Ortho Exam  Specialty Comments:  No specialty comments available.  Imaging: No results found.   PMFS History: Patient Active Problem List   Diagnosis Date Noted   Stress incontinence 09/04/2021   Laryngitis 08/30/2021   Right lower quadrant abdominal pain 08/23/2021   Pure hypercholesterolemia 08/21/2020   Morbid obesity ([place]) 11/11/2016   Moderate persistent asthma 06/27/2016   Anxiety state 12/19/2015   Essential hypertension 10/09/2015   CHF (congestive heart failure), NYHA class I, chronic, systolic ([place]) 09/32/6712   GERD (gastroesophageal reflux disease)    Past  Medical History:  Diagnosis Date   Allergy-induced asthma    Allergy induced asthma   Anxiety    Bronchitis    Cancer ([place])    pre HPV cervical cancer 14 years ago   Chronic systolic CHF (congestive heart failure) ([place])    Essential hypertension    Taken for EF function and tachycardia   GERD (gastroesophageal reflux disease)    Headache    migraines   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 EF 40-45% in 2016, varying by several echoes. F/u echo 06/2015 showed EF 50%.    PONV (postoperative nausea and vomiting)    Sinus tachycardia    a. 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 b. 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     Family History  Problem Relation Age of Onset   Asthma Mother    Crohn's disease Mother    Colon polyps Mother    Allergic rhinitis Mother    Heart disease Father        heart failure   Hyperlipidemia Father    Hemophilia Father    COPD Father        smoked   Urticaria Father    Allergic rhinitis Sister    Heart failure Brother    Allergic rhinitis Daughter    Hemophilia Son    Allergic rhinitis Son    Food Allergy Son    Heart failure Paternal Aunt    Heart failure Paternal Uncle    COPD Maternal Grandmother        smoked   Lung cancer Maternal Grandmother        smoked   Cancer Maternal Grandfather        skin   Heart attack Maternal Grandfather    Diabetes Paternal Grandfather    Stroke Paternal Grandfather    Asthma Niece    Hypertension Neg Hx    Eczema Neg Hx    Angioedema Neg Hx    Atopy Neg Hx    Immunodeficiency Neg Hx     Past Surgical History:  Procedure Laterality Date   ABDOMINAL HYSTERECTOMY     CESAREAN SECTION  03-01-2008  and 2016   IM PINNING RIGHT ELBOW FX  1992   HARDWARE REMOVED   LAPAROSCOPIC ASSISTED VAGINAL HYSTERECTOMY Left 02/04/2017   Procedure: LAPAROSCOPIC ASSISTED VAGINAL HYSTERECTOMY, LEFT SALPINGOOPHORECTOMY;  Surgeon: Dian Queen, MD;  Location: [facility];  Service: Gynecology;  Laterality: Left;  NEED BED   LAPAROSCOPY N/A 07/29/2014   Procedure: LAPAROSCOPY DIAGNOSTIC;  Surgeon: Cyril Mourning, MD;  Location: [facility];  Service: Gynecology;  Laterality: N/A;   OOPHORECTOMY Left 02/04/2017   Procedure: LEFT OOPHORECTOMY;  Surgeon: Dian Queen, MD;  Location[facility];  Service: Gynecology;  Laterality: Left;   Social History   Occupational History   Occupation: CNA   Tobacco Use   Smoking status: Former    Packs/day: 0.50    Years: 10.00    Pack years: 5.00    Types: Cigarettes   Smokeless tobacco: Never  Vaping Use   Vaping Use: Some days  Substance and  Sexual Activity   Alcohol use: Not Currently   Drug use: No   Sexual activity: Yes    Birth control/protection: Surgical

## 2021-11-08 ENCOUNTER — Ambulatory Visit (INDEPENDENT_AMBULATORY_CARE_PROVIDER_SITE_OTHER): Payer: BC Managed Care – PPO | Admitting: Surgery

## 2021-11-08 ENCOUNTER — Encounter: Payer: Self-pay | Admitting: Surgery

## 2021-11-08 VITALS — BP 124/88 | HR 50 | Ht 67.0 in | Wt 296.8 lb

## 2021-11-08 DIAGNOSIS — M5416 Radiculopathy, lumbar region: Secondary | ICD-10-CM

## 2021-11-08 NOTE — Telephone Encounter (Signed)
Got it. Thank you.

## 2021-11-08 NOTE — Progress Notes (Signed)
? ?Office Visit Note ?  ?Patient: Michelle Kane           ?Date of Birth: 05/14/1986           ?MRN: [id number] ?Visit Date: 11/08/2021 ?             ?Requested by: Charyl Dancer, NP ?[place],  [place] [address] ?PCP: Charyl Dancer, NP ? ? ?Assessment & Plan: ?Visit Diagnoses:  ?1. Radiculopathy, lumbar region   ? ? ?Plan: The patient's ongoing symptoms I do feel conservative treatment recommend getting a lumbar MRI to rule out HNP/stenosis.  Given she does have L5-S1 disc base collapse.  Follow with Dr. Lorin Mercy in 3 weeks to discuss results and further treatment options. ? ?Follow-Up Instructions: Return in about 3 weeks (around 11/29/2021) for with dr yates to review lumbar mri.  ? ?Orders:  ?Orders Placed This Encounter  ?Procedures  ? MR Lumbar Spine w/o contrast  ? ?No orders of the defined types were placed in this encounter. ? ? ? ? Procedures: ?No procedures performed ? ? ?Clinical Data: ?No additional findings. ? ? ?Subjective: ?Chief Complaint  ?Patient presents with  ? Lower Back - Follow-up  ? ? ?HPI ?36 year old white female returns for recheck of her back pain and lower extremity radiculopathy.  States that she completed the prednisone taper and muscle laxer and this did not help her pain whatsoever.  She does state that she had a was also helping her bedridden mother and this may have contributed to the medication not working. ?Review of Systems ?No current cardiopulmonary GI/GU issues ? ?Objective: ?Vital Signs: BP 124/88   Pulse (!) 50   Ht '5\' 7"'$  (1.702 m)   Wt 296 lb 12.8 oz (134.6 kg)   LMP 12/09/2016   BMI 46.49 kg/m?  ? ?Physical Exam ?Constitutional:   ?   Appearance: She is obese.  ?HENT:  ?   Nose: Nose normal.  ?Eyes:  ?   Extraocular Movements: Extraocular movements intact.  ?Musculoskeletal:  ?   Comments: Gait is normal.  Bilateral lumbar paraspinal tenderness/spasm.  Negative logroll bilateral hips.  Positive left straight leg raise.  No focal motor  deficits.  ?Neurological:  ?   Mental Status: She is alert and oriented to person, place, and time.  ?Psychiatric:     ?   Mood and Affect: Mood normal.  ? ? ?Ortho Exam ? ?Specialty Comments:  ?No specialty comments available. ? ?Imaging: ?No results found. ? ? ?PMFS History: ?Patient Active Problem List  ? Diagnosis Date Noted  ? Stress incontinence 09/04/2021  ? Laryngitis 08/30/2021  ? Right lower quadrant abdominal pain 08/23/2021  ? Pure hypercholesterolemia 08/21/2020  ? Morbid obesity ([place]) 11/11/2016  ? Moderate persistent asthma 06/27/2016  ? Anxiety state 12/19/2015  ? Essential hypertension 10/09/2015  ? CHF (congestive heart failure), NYHA class I, chronic, systolic ([place]) 28/36/6294  ? GERD (gastroesophageal reflux disease)   ? ?Past Medical History:  ?Diagnosis Date  ? Allergy-induced asthma   ? Allergy induced asthma  ? Anxiety   ? Bronchitis   ? Cancer [facility])   ? pre HPV cervical cancer 14 years ago  ? Chronic systolic CHF (congestive heart failure) ([place])   ? Essential hypertension   ? Taken for EF function and tachycardia  ? GERD (gastroesophageal reflux disease)   ? Headache   ? migraines  ? 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 History of colon polyps   ? History  of ovarian cyst   ? Pelvic hematoma, female   ? Pelvic pain in female   ? Peripartum cardiomyopathy   ? a. EF 40-45% in 2016, varying by several echoes. F/u echo 06/2015 showed EF 50%.   ? PONV (postoperative nausea and vomiting)   ? Sinus tachycardia   ? a. 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 b. 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  ?  ?Family History  ?Problem Relation Age of Onset  ? Asthma Mother   ? Crohn's disease Mother   ? Colon polyps Mother   ? Allergic rhinitis Mother   ? Heart disease Father   ?     heart failure  ? Hyperlipidemia Father   ? Hemophilia Father   ? COPD Father   ?     smoked  ? Urticaria Father   ? Allergic rhinitis Sister   ? Heart failure Brother   ?  Allergic rhinitis Daughter   ? Hemophilia Son   ? Allergic rhinitis Son   ? Food Allergy Son   ? Heart failure Paternal Aunt   ? Heart failure Paternal Uncle   ? COPD Maternal Grandmother   ?     smoked  ? Lung cancer Maternal Grandmother   ?     smoked  ? Cancer Maternal Grandfather   ?     skin  ? Heart attack Maternal Grandfather   ? Diabetes Paternal Grandfather   ? Stroke Paternal Grandfather   ? Asthma Niece   ? Hypertension Neg Hx   ? Eczema Neg Hx   ? Angioedema Neg Hx   ? Atopy Neg Hx   ? Immunodeficiency Neg Hx   ?  ?Past Surgical History:  ?Procedure Laterality Date  ? ABDOMINAL HYSTERECTOMY    ? CESAREAN SECTION  03-01-2008  and 2016  ? IM PINNING RIGHT ELBOW FX  1992  ? HARDWARE REMOVED  ? LAPAROSCOPIC ASSISTED VAGINAL HYSTERECTOMY Left 02/04/2017  ? Procedure: LAPAROSCOPIC ASSISTED VAGINAL HYSTERECTOMY, LEFT SALPINGOOPHORECTOMY;  Surgeon: Dian Queen, MD;  Location: [facility];  Service: Gynecology;  Laterality: Left;  NEED BED  ? LAPAROSCOPY N/A 07/29/2014  ? Procedure: LAPAROSCOPY DIAGNOSTIC;  Surgeon: Cyril Mourning, MD;  Location: [facility];  Service: Gynecology;  Laterality: N/A;  ? OOPHORECTOMY Left 02/04/2017  ? Procedure: LEFT OOPHORECTOMY;  Surgeon: Dian Queen, MD;  Location: [facility];  Service: Gynecology;  Laterality: Left;  ? ?Social History  ? ?Occupational History  ? Occupation: CNA   ?Tobacco Use  ? Smoking status: Former  ?  Packs/day: 0.50  ?  Years: 10.00  ?  Pack years: 5.00  ?  Types: Cigarettes  ? Smokeless tobacco: Never  ?Vaping Use  ? Vaping Use: Some days  ?Substance and Sexual Activity  ? Alcohol use: Not Currently  ? Drug use: No  ? Sexual activity: Yes  ?  Birth control/protection: Surgical  ? ? ? ? ? ? ?

## 2021-11-12 ENCOUNTER — Encounter: Payer: Self-pay | Admitting: Radiology

## 2021-11-22 ENCOUNTER — Ambulatory Visit
Admission: RE | Admit: 2021-11-22 | Discharge: 2021-11-22 | Disposition: A | Discharge status: Home / Self Care | Payer: BC Managed Care – PPO | Source: Ambulatory Visit | Attending: Surgery | Admitting: Surgery

## 2021-11-22 DIAGNOSIS — R200 Anesthesia of skin: Secondary | ICD-10-CM | POA: Diagnosis not present

## 2021-11-22 DIAGNOSIS — M5416 Radiculopathy, lumbar region: Secondary | ICD-10-CM | POA: Diagnosis not present

## 2021-11-22 DIAGNOSIS — M545 Low back pain, unspecified: Secondary | ICD-10-CM | POA: Diagnosis not present

## 2021-11-30 ENCOUNTER — Ambulatory Visit (INDEPENDENT_AMBULATORY_CARE_PROVIDER_SITE_OTHER): Payer: BC Managed Care – PPO | Admitting: Orthopaedic Surgery

## 2021-11-30 ENCOUNTER — Encounter: Payer: Self-pay | Admitting: Orthopaedic Surgery

## 2021-11-30 VITALS — BP 126/87 | HR 81 | Ht 66.75 in | Wt 280.0 lb

## 2021-11-30 DIAGNOSIS — M545 Low back pain, unspecified: Secondary | ICD-10-CM

## 2021-11-30 DIAGNOSIS — M5416 Radiculopathy, lumbar region: Secondary | ICD-10-CM

## 2021-11-30 DIAGNOSIS — Z6841 Body Mass Index (BMI) 40.0 and over, adult: Secondary | ICD-10-CM

## 2021-11-30 NOTE — Progress Notes (Signed)
Office Visit Note   Patient: Michelle Kane           Date of Birth: 08/12/1985           MRN: [id number] Visit Date: 11/30/2021              Requested by: Charyl Dancer, NP [place],  [place] [address] PCP: Charyl Dancer, NP   Assessment & Plan: Visit Diagnoses:  1. Radiculopathy, lumbar region   2. Acute low back pain without sciatica, unspecified back pain laterality   3. Body mass index 40.0-44.9, adult ([place])     Plan: We reviewed MRI scan.  We will set up for single epidural at L5-S1 for central disc protrusion.  Referral to the [facility] weight loss clinic with BMI 44.   The patient meets the AMA guidelines for Morbid (severe) obesity with a BMI > 40.0 and I have recommended weight loss.  We discussed she may get some improvement in her back symptoms with the epidural as well as weight loss.  She does have a history of hypertension and also heart failure.  She can follow-up after weight reduction BMI below 40 and if the epidural has not been effective.  MRI scan pathophysiology discussed I gave her a copy of the report.   Follow-Up Instructions: No follow-ups on file.   Orders:  Orders Placed This Encounter  Procedures   Amb Ref to Medical Weight Management   Ambulatory referral to Physical Medicine Rehab   No orders of the defined types were placed in this encounter.     Procedures: No procedures performed   Clinical Data: No additional findings.   Subjective: Chief Complaint  Patient presents with   Lower Back - Pain, Follow-up    MRI review    HPI 36 year old female returns with ongoing problems with back pain symptoms worse on the left than right.  Patient has noticed pain and points to the sacroiliac joint region or dimples present and she states this is where she noticed a knot x2 years.  She has tried ibuprofen to see if it would ease it.  MRI scan shows medium sized central disc protrusion narrowing the lateral recess in close  proximity to both right and left S1 nerve root.  Also small disc at L4-5 centrally without compression.  Patient has increased discomfort with bending turning twisting activities.  No associated bowel or bladder symptoms no fever or chills.  Review of Systems all other systems updated noncontributory to HPI.   Objective: Vital Signs: BP 126/87   Pulse 81   Ht 5' 6.75" (1.695 m)   Wt 280 lb (127 kg)   LMP 12/09/2016   BMI 44.18 kg/m   Physical Exam Constitutional:      Appearance: She is well-developed.  HENT:     Head: Normocephalic.     Right Ear: External ear normal.     Left Ear: External ear normal. There is no impacted cerumen.  Eyes:     Pupils: Pupils are equal, round, and reactive to light.  Neck:     Thyroid: No thyromegaly.     Trachea: No tracheal deviation.  Cardiovascular:     Rate and Rhythm: Normal rate.  Pulmonary:     Effort: Pulmonary effort is normal.  Abdominal:     Palpations: Abdomen is soft.  Musculoskeletal:     Cervical back: No rigidity.  Skin:    General: Skin is warm and dry.  Neurological:  Mental Status: She is alert and oriented to person, place, and time.  Psychiatric:        Behavior: Behavior normal.    Ortho Exam some tenderness over the left temple at the SI joint.  Minimal sciatic notch tenderness.  Mild discomfort straight leg raising at 90 degrees on the left negative on the right negative logroll hips knee and ankle jerk are 1+ and symmetrical.  Specialty Comments:  No specialty comments available.  Imaging: CLINICAL DATA:  Lumbar radiculopathy. Low back pain radiating to bilateral hips with numbness in back for 2 years.   EXAM: MRI LUMBAR SPINE WITHOUT CONTRAST   TECHNIQUE: Multiplanar, multisequence MR imaging of the lumbar spine was performed. No intravenous contrast was administered.   COMPARISON:  X-ray lumbar 10/18/2021.   FINDINGS: Segmentation:  Standard.   Alignment:  Physiologic.   Vertebrae:  No  fracture, evidence of discitis, or bone lesion.   Conus medullaris and cauda equina: Conus extends to the L1 level. Conus and cauda equina appear normal.   Paraspinal and other soft tissues: Negative.   Disc levels:   The T10-L4 disc levels are normal.   L4-5: Small left central disc protrusion without spinal canal or neural foraminal stenosis.   L5-S1: Medium-sized central disc protrusion narrowing the lateral recesses in close proximity to both S1 nerve roots. No central spinal canal stenosis. Mild right neural foraminal stenosis.   IMPRESSION: 1. L5-S1 medium-sized central disc protrusion narrowing the lateral recesses in close proximity to both S1 nerve roots. 2. Small left central disc protrusion at L4-L5 without associated stenosis.     Electronically Signed   By: Ulyses Jarred M.D.   On: 11/22/2021 16:07   PMFS History: Patient Active Problem List   Diagnosis Date Noted   Stress incontinence 09/04/2021   Laryngitis 08/30/2021   Right lower quadrant abdominal pain 08/23/2021   Pure hypercholesterolemia 08/21/2020   Morbid obesity ([place]) 11/11/2016   Moderate persistent asthma 06/27/2016   Anxiety state 12/19/2015   Essential hypertension 10/09/2015   CHF (congestive heart failure), NYHA class I, chronic, systolic ([place]) 02/63/7858   GERD (gastroesophageal reflux disease)    Past Medical History:  Diagnosis Date   Allergy-induced asthma    Allergy induced asthma   Anxiety    Bronchitis    Cancer ([place])    pre HPV cervical cancer 14 years ago   Chronic systolic CHF (congestive heart failure) ([place])    Essential hypertension    Taken for EF function and tachycardia   GERD (gastroesophageal reflux disease)    Headache    migraines   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 EF 40-45% in 2016, varying by several echoes. F/u  echo 06/2015 showed EF 50%.    PONV (postoperative nausea and vomiting)    Sinus tachycardia    a. 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 b. 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    Family History  Problem Relation Age of Onset   Asthma Mother    Crohn's disease Mother    Colon polyps Mother    Allergic rhinitis Mother    Heart disease Father        heart failure   Hyperlipidemia Father    Hemophilia Father    COPD Father  smoked   Urticaria Father    Allergic rhinitis Sister    Heart failure Brother    Allergic rhinitis Daughter    Hemophilia Son    Allergic rhinitis Son    Food Allergy Son    Heart failure Paternal Aunt    Heart failure Paternal Uncle    COPD Maternal Grandmother        smoked   Lung cancer Maternal Grandmother        smoked   Cancer Maternal Grandfather        skin   Heart attack Maternal Grandfather    Diabetes Paternal Grandfather    Stroke Paternal Grandfather    Asthma Niece    Hypertension Neg Hx    Eczema Neg Hx    Angioedema Neg Hx    Atopy Neg Hx    Immunodeficiency Neg Hx     Past Surgical History:  Procedure Laterality Date   ABDOMINAL HYSTERECTOMY     CESAREAN SECTION  03-01-2008  and 2016   IM PINNING RIGHT ELBOW FX  1992   HARDWARE REMOVED   LAPAROSCOPIC ASSISTED VAGINAL HYSTERECTOMY Left 02/04/2017   Procedure: LAPAROSCOPIC ASSISTED VAGINAL HYSTERECTOMY, LEFT SALPINGOOPHORECTOMY;  Surgeon: Dian Queen, MD;  Location: [facility];  Service: Gynecology;  Laterality: Left;  NEED BED   LAPAROSCOPY N/A 07/29/2014   Procedure: LAPAROSCOPY DIAGNOSTIC;  Surgeon: Cyril Mourning, MD;  Location: [facility];  Service: Gynecology;  Laterality: N/A;   OOPHORECTOMY Left 02/04/2017   Procedure: LEFT OOPHORECTOMY;  Surgeon: Dian Queen, MD;  Location: [facility];  Service: Gynecology;  Laterality: Left;   Social History    Occupational History   Occupation: CNA   Tobacco Use   Smoking status: Former    Packs/day: 0.50    Years: 10.00    Pack years: 5.00    Types: Cigarettes   Smokeless tobacco: Never  Vaping Use   Vaping Use: Some days  Substance and Sexual Activity   Alcohol use: Not Currently   Drug use: No   Sexual activity: Yes    Birth control/protection: Surgical

## 2021-12-11 NOTE — Telephone Encounter (Signed)
Can you please let me know where we are on referral for ESI? I will be glad to contact patient.  Thanks.

## 2021-12-11 NOTE — Telephone Encounter (Signed)
Patient would like a epidural injection was wondering if Lorin Mercy was still going to do that and if so could we set an appt.

## 2022-01-07 ENCOUNTER — Ambulatory Visit: Payer: Self-pay

## 2022-01-07 ENCOUNTER — Ambulatory Visit (INDEPENDENT_AMBULATORY_CARE_PROVIDER_SITE_OTHER): Payer: BC Managed Care – PPO | Admitting: Physical Medicine and Rehabilitation

## 2022-01-07 ENCOUNTER — Encounter: Payer: Self-pay | Admitting: Physical Medicine and Rehabilitation

## 2022-01-07 VITALS — BP 138/91 | HR 96

## 2022-01-07 DIAGNOSIS — M5416 Radiculopathy, lumbar region: Secondary | ICD-10-CM

## 2022-01-07 MED ORDER — METHYLPREDNISOLONE ACETATE 80 MG/ML IJ SUSP
80.0000 mg | Freq: Once | INTRAMUSCULAR | Status: AC
  Administered 2022-01-07: 80 mg

## 2022-01-27 ENCOUNTER — Encounter: Payer: Self-pay | Admitting: Nurse Practitioner

## 2022-01-27 ENCOUNTER — Encounter: Payer: Self-pay | Admitting: Physical Medicine and Rehabilitation

## 2022-01-28 NOTE — Telephone Encounter (Signed)
Called and scheduled appt 01/31/22 '@840am'$ . Pt to discuss concerns during appt time. Sw, cma

## 2022-01-31 ENCOUNTER — Encounter: Payer: Self-pay | Admitting: Nurse Practitioner

## 2022-01-31 ENCOUNTER — Ambulatory Visit (INDEPENDENT_AMBULATORY_CARE_PROVIDER_SITE_OTHER): Payer: BC Managed Care – PPO | Admitting: Nurse Practitioner

## 2022-01-31 VITALS — BP 125/88 | HR 89 | Temp 96.7°F | Wt 289.4 lb

## 2022-01-31 DIAGNOSIS — G8929 Other chronic pain: Secondary | ICD-10-CM | POA: Insufficient documentation

## 2022-01-31 DIAGNOSIS — M545 Low back pain, unspecified: Secondary | ICD-10-CM | POA: Diagnosis not present

## 2022-01-31 MED ORDER — WEGOVY 0.25 MG/0.5ML ~~LOC~~ SOAJ: 0.2500 mg | SUBCUTANEOUS | 0 refills | Status: DC

## 2022-01-31 NOTE — Patient Instructions (Addendum)
It was great to see you!  Start Wegovy injection once a week. 0.'25mg'$  injection for 4 weeks, then 0.'5mg'$  injection weekly. If you can't find wegovy at the pharmacy, ask the pharmacy if they have Temple (daily injection)  Let's follow-up in 6-8 weeks, sooner if you have concerns.  If a referral was placed today, you will be contacted for an appointment. Please note that routine referrals can sometimes take up to 3-4 weeks to process. Please call our office if you haven't heard anything after this time frame.  Take care,  Vance Peper, NP

## 2022-01-31 NOTE — Assessment & Plan Note (Signed)
She is having ongoing back pain and has done physical therapy, injections, and now she needs to have surgery on it since nothing is helping the pain.  She states that for insurance to cover the surgery, she needs to lose about 30 to 40 pounds and have her BMI be less than 40.  She is currently following with orthopedics. 

## 2022-01-31 NOTE — Assessment & Plan Note (Signed)
BMI today is 45.6.  She has been adjusting her diet, she cut out bread and pasta and sweets.  She is unable to exercise due to the pain in her back.  She has been working on her portion control sizes and states that when she had done all of this in the past she had lost weight, however now she is not losing any weight.  She is interested in starting medication for weight loss.  She does have a history of heart failure, I would not want to start her on phentermine.  We will start Wegovy 0.25 mg injection once a week.  Discussed that this would have to be approved by her insurance company and she will be hearing from [place] soon.  Continue with the additional changes she has been doing.  Follow-up in 6 to 8 weeks.

## 2022-01-31 NOTE — Progress Notes (Signed)
Acute Office Visit  Subjective:     Patient ID: Michelle Kane, female    DOB: 20-Mar-1986, 36 y.o.   MRN: [id number]  Chief Complaint  Patient presents with   Weight Loss    Pt is here to discuss weight management/ weight loss medication    HPI Patient is in today for to discuss weight management.   She has a history of L4-L5 collapse. She is following with orthopedics and has gone to PT, done injections, and now she needs surgery. She was told she needs her BMI to be below 40 for her insurance to authorize the surgery.   This is currently the heavies she has been. She started gaining weight after her hysterectomy. She has been watching portion sizes. She decreased her carbohydrates, is increasing her protein. She cut out sweets, bread, and pasta.  She states she has lost weight doing these changes in the past, however she is still not losing any weight. She is interested in starting injections for weight loss.   ROS See pertinent positives and negatives per HPI.     Objective:    BP 125/88 (BP Location: Left Arm, Patient Position: Sitting, Cuff Size: Large)   Pulse 89   Temp (!) 96.7 F (35.9 C) (Temporal)   Wt 289 lb 6.4 oz (131.3 kg)   LMP 12/09/2016   SpO2 99%   BMI 45.67 kg/m    Physical Exam Vitals and nursing note reviewed.  Constitutional:      General: She is not in acute distress.    Appearance: Normal appearance.  HENT:     Head: Normocephalic.  Eyes:     Conjunctiva/sclera: Conjunctivae normal.  Cardiovascular:     Rate and Rhythm: Normal rate and regular rhythm.     Pulses: Normal pulses.     Heart sounds: Normal heart sounds.  Pulmonary:     Effort: Pulmonary effort is normal.     Breath sounds: Normal breath sounds.  Musculoskeletal:     Cervical back: Normal range of motion.  Skin:    General: Skin is warm.  Neurological:     General: No focal deficit present.     Mental Status: She is alert and oriented to person, place, and time.   Psychiatric:        Mood and Affect: Mood normal.        Behavior: Behavior normal.        Thought Content: Thought content normal.        Judgment: Judgment normal.      Assessment & Plan:   Problem List Items Addressed This Visit       Other   Morbid obesity ([place])    BMI today is 45.6.  She has been adjusting her diet, she cut out bread and pasta and sweets.  She is unable to exercise due to the pain in her back.  She has been working on her portion control sizes and states that when she had done all of this in the past she had lost weight, however now she is not losing any weight.  She is interested in starting medication for weight loss.  She does have a history of heart failure, I would not want to start her on phentermine.  We will start Wegovy 0.25 mg injection once a week.  Discussed that this would have to be approved by her insurance company and she will be hearing from [place] soon.  Continue with the additional changes she has been  doing.  Follow-up in 6 to 8 weeks.      Relevant Medications   Semaglutide-Weight Management (WEGOVY) 0.25 MG/0.5ML SOAJ   Chronic midline low back pain without sciatica - Primary    She is having ongoing back pain and has done physical therapy, injections, and now she needs to have surgery on it since nothing is helping the pain.  She states that for insurance to cover the surgery, she needs to lose about 30 to 40 pounds and have her BMI be less than 40.  She is currently following with orthopedics.       Meds ordered this encounter  Medications   Semaglutide-Weight Management (WEGOVY) 0.25 MG/0.5ML SOAJ    Sig: Inject 0.25 mg into the skin once a week.    Dispense:  2 mL    Refill:  0    Return in about 6 weeks (around 03/14/2022) for weight management.  Charyl Dancer, NP

## 2022-02-04 ENCOUNTER — Ambulatory Visit (INDEPENDENT_AMBULATORY_CARE_PROVIDER_SITE_OTHER): Payer: BC Managed Care – PPO | Admitting: Nurse Practitioner

## 2022-02-04 ENCOUNTER — Encounter: Payer: Self-pay | Admitting: Nurse Practitioner

## 2022-02-04 VITALS — BP 124/88 | HR 87 | Temp 97.4°F | Wt 288.8 lb

## 2022-02-04 DIAGNOSIS — R109 Unspecified abdominal pain: Secondary | ICD-10-CM

## 2022-02-04 DIAGNOSIS — R1030 Lower abdominal pain, unspecified: Secondary | ICD-10-CM

## 2022-02-04 LAB — POCT URINALYSIS DIPSTICK
Bilirubin, UA: NEGATIVE
Blood, UA: NEGATIVE
Glucose, UA: NEGATIVE
Ketones, UA: NEGATIVE
Leukocytes, UA: NEGATIVE
Nitrite, UA: NEGATIVE
Protein, UA: NEGATIVE
Spec Grav, UA: 1.01 (ref 1.010–1.025)
Urobilinogen, UA: 0.2 E.U./dL
pH, UA: 8 (ref 5.0–8.0)

## 2022-02-04 NOTE — Assessment & Plan Note (Addendum)
U/A negative for UTI. However with symptoms, especially hematuria, will check CT abdomen/pelvis to R/O kidney stone. Encouraged her to drink plenty of fluids. F/U based on results.

## 2022-02-04 NOTE — Patient Instructions (Addendum)
It was great to see you!  I am ordering a CT of your abdomen to rule out kidney stones. Make sure your are drinking plenty of fluids.   Let's follow-up if your symptoms worsen or don't improve.   Take care,  Vance Peper, NP

## 2022-02-04 NOTE — Progress Notes (Signed)
Acute Office Visit  Subjective:     Patient ID: Michelle Kane, female    DOB: [birth year], 36 y.o.   MRN: [id number]  Chief Complaint  Patient presents with   Acute Visit    Pt c/o LT flank pain w/ nausea x1 day. Pt states no painful urination or bladder pressure, no blood in urine.     HPI Patient is in today for left flank pain that started over the past few days. The pain is now starting to radiate around the front. Today, she noticed blood in urine and a pink tinge on the toilet tissue. She describes the pain as a constant ache and intermittent sharp pain. She endorses nausea. Denies dysuria, urinary frequency, and fevers.   ROS See pertinent positives and negatives per HPI.     Objective:    BP 124/88 (BP Location: Left Arm, Patient Position: Sitting, Cuff Size: Large)   Pulse 87   Temp (!) 97.4 F (36.3 C) (Temporal)   Wt 288 lb 12.8 oz (131 kg)   LMP 12/09/2016   SpO2 98%   BMI 45.57 kg/m    Physical Exam Vitals and nursing note reviewed.  Constitutional:      General: She is not in acute distress.    Appearance: Normal appearance.  HENT:     Head: Normocephalic.  Eyes:     Conjunctiva/sclera: Conjunctivae normal.  Pulmonary:     Effort: Pulmonary effort is normal.  Abdominal:     Palpations: Abdomen is soft.     Tenderness: There is no abdominal tenderness. There is no right CVA tenderness or left CVA tenderness.  Musculoskeletal:     Cervical back: Normal range of motion.  Skin:    General: Skin is warm.  Neurological:     General: No focal deficit present.     Mental Status: She is alert and oriented to person, place, and time.  Psychiatric:        Mood and Affect: Mood normal.        Behavior: Behavior normal.        Thought Content: Thought content normal.        Judgment: Judgment normal.     Results for orders placed or performed in visit on 02/04/22  POCT urinalysis dipstick  Result Value Ref Range   Color, UA YELLOW    Clarity, UA  CLOUDY    Glucose, UA Negative Negative   Bilirubin, UA NEG    Ketones, UA NEG    Spec Grav, UA 1.010 1.010 - 1.025   Blood, UA NEG    pH, UA 8.0 5.0 - 8.0   Protein, UA Negative Negative   Urobilinogen, UA 0.2 0.2 or 1.0 E.U./dL   Nitrite, UA NEG    Leukocytes, UA Negative Negative   Appearance     Odor          Assessment & Plan:   Problem List Items Addressed This Visit       Other   Flank pain - Primary    U/A negative for UTI. However with symptoms, especially hematuria, will check CT abdomen/pelvis to R/O kidney stone. Encouraged her to drink plenty of fluids. F/U based on results.      Relevant Orders   POCT urinalysis dipstick (Completed)   CT ABDOMEN PELVIS W WO CONTRAST   Other Visit Diagnoses     Lower abdominal pain       Left flank pain that radiates around to her left lower abdomen.  U/A negative, however with symptoms will check CT to rule out kidney stones.    Relevant Orders   CT ABDOMEN PELVIS W WO CONTRAST       No orders of the defined types were placed in this encounter.   Return if symptoms worsen or fail to improve.  Charyl Dancer, NP

## 2022-02-05 ENCOUNTER — Encounter: Payer: Self-pay | Admitting: Nurse Practitioner

## 2022-02-05 ENCOUNTER — Ambulatory Visit
Admission: RE | Admit: 2022-02-05 | Discharge: 2022-02-05 | Disposition: A | Discharge status: Home / Self Care | Payer: BC Managed Care – PPO | Source: Ambulatory Visit | Attending: Nurse Practitioner | Admitting: Nurse Practitioner

## 2022-02-05 DIAGNOSIS — R109 Unspecified abdominal pain: Secondary | ICD-10-CM

## 2022-02-05 DIAGNOSIS — R319 Hematuria, unspecified: Secondary | ICD-10-CM | POA: Diagnosis not present

## 2022-02-05 DIAGNOSIS — R1030 Lower abdominal pain, unspecified: Secondary | ICD-10-CM

## 2022-02-06 ENCOUNTER — Encounter: Payer: Self-pay | Admitting: Nurse Practitioner

## 2022-02-14 ENCOUNTER — Encounter: Payer: Self-pay | Admitting: Physical Medicine and Rehabilitation

## 2022-02-14 ENCOUNTER — Ambulatory Visit (INDEPENDENT_AMBULATORY_CARE_PROVIDER_SITE_OTHER): Payer: BC Managed Care – PPO | Admitting: Physical Medicine and Rehabilitation

## 2022-02-14 VITALS — BP 125/86 | HR 90

## 2022-02-14 DIAGNOSIS — M5416 Radiculopathy, lumbar region: Secondary | ICD-10-CM

## 2022-02-14 DIAGNOSIS — G8929 Other chronic pain: Secondary | ICD-10-CM

## 2022-02-14 DIAGNOSIS — M5116 Intervertebral disc disorders with radiculopathy, lumbar region: Secondary | ICD-10-CM | POA: Diagnosis not present

## 2022-02-14 DIAGNOSIS — M5441 Lumbago with sciatica, right side: Secondary | ICD-10-CM

## 2022-02-14 DIAGNOSIS — M4726 Other spondylosis with radiculopathy, lumbar region: Secondary | ICD-10-CM

## 2022-02-14 DIAGNOSIS — M5442 Lumbago with sciatica, left side: Secondary | ICD-10-CM

## 2022-02-14 NOTE — Progress Notes (Signed)
Pt state lower back pain that travels to both legs, mostly her left side. Pt the pain comes round to her stomach. Pt state walking, standing and sitting makes the pain worse. Pt state she takes nothing for the pain.  Numeric Pain Rating Scale and Functional Assessment Average Pain 10 Pain Right Now 5 My pain is intermittent, sharp, dull, stabbing, and aching Pain is worse with: walking, sitting, standing, and some activites Pain improves with: heat/ice and medication   In the last MONTH (on 0-10 scale) has pain interfered with the following?  1. General activity like being  able to carry out your everyday physical activities such as walking, climbing stairs, carrying groceries, or moving a chair?  Rating(5)  2. Relation with others like being able to carry out your usual social activities and roles such as  activities at home, at work and in your community. Rating(6)  3. Enjoyment of life such that you have  been bothered by emotional problems such as feeling anxious, depressed or irritable?  Rating(7)

## 2022-02-14 NOTE — Progress Notes (Signed)
[name] - 37 y.o. female MRN [id number]  Date of birth: [birth year]  Office Visit Note: Visit Date: 02/14/2022 PCP: Charyl Dancer, NP Referred by: Charyl Dancer, NP  Subjective: Chief Complaint  Patient presents with   Right Leg - Pain   Left Leg - Pain   Lower Back - Pain   HPI: Michelle Kane is a 36 y.o. female who comes in today for evaluation of chronic, worsening and severe bilateral lower back pain radiating to buttocks and hips. Pain ongoing for several years and is exacerbated by prolonged sitting, movement and activity. She describes pain as sore, aching and sharp sensation, currently rates as 8 out of 10. Patient reports some relief of pain with home exercise regimen, rest and use of medications. Patient did attend formal physical therapy at [facility] in 2022, reports no relief of pain with these treatments. Patient states she has also tried Prednisone, Robaxin, Tylenol and Ibuprofen without significant relief of pain. Patients recent lumbar MRI imaging exhibits medium-sized central disc protrusion narrowing the lateral recesses in close proximity to both S1 nerve roots at L5-S1. No high grade spinal canal stenosis noted. Patient had right L5-S1 interlaminar epidural steroid injection performed in our office on 01/07/22. She reports she noticed pain relief about a week after injection that lasted several days, states 60% pain relief and significant increase in functionality. Patient states post injection she was able to participate in activities and perform daily tasks without difficulty. We recently placed order for bilateral S1 transforaminal epidural steroid injection, however patients secondary insurance denied this request as it has not been 3 months since last injection. Patient would like to try for approval again in September. She states she would like to try and avoid surgery if possible as she does care for her son with autism. Patient  states she is working on weight loss at this time. Patient denies focal weakness, numbness and tingling. Patient denies recent trauma or falls.    Review of Systems  Musculoskeletal:  Positive for back pain.  Neurological:  Negative for tingling, sensory change, focal weakness and weakness.  All other systems reviewed and are negative.  Otherwise per HPI.  Assessment & Plan: Visit Diagnoses:    ICD-10-CM   1. Lumbar radiculopathy  M54.16     2. Chronic bilateral low back pain with bilateral sciatica  M54.42    M54.41    G89.29     3. Intervertebral disc disorders with radiculopathy, lumbar region  M51.16     4. Other spondylosis with radiculopathy, lumbar region  M47.26        Plan: Findings:  Chronic, worsening and severe bilateral lower back pain radiating to buttocks and hips. Patient continues to have severe pain despite good conservative therapies such as formal physical therapy, home exercise regimen, rest and use of medications. Patients clinical presentation and exam are consistent with S1 nerve pattern.  Prior interlaminar epidural did diagnostically seem to help but just in the last long.  Next step is to perform diagnostic and hopefully therapeutic bilateral S1 transforaminal epidural steroid injection under fluoroscopic guidance.  Performing the epidural in a different manner does clinically have value and there are reports of this.  Unfortunately insurance company seem to not be able to recognize this even though clinically we see it all the time.  We did not complete injections as a series of injections this is clearly a different approach to the same problem.  I  will place order today and work with authorization team to get this submitted in September. Patient encouraged to remain active as tolerated and to continue with weight loss program. No red flag symptoms noted upon exam today.   One of the insurance issues is that her secondary insurance which was a [employer]  was the one denying the injection.  She needs to do some homework on that secondary insurance because it may not even make a big difference in terms of the total cost of the injection but is the reason for denial.    Meds & Orders: No orders of the defined types were placed in this encounter.  No orders of the defined types were placed in this encounter.   Follow-up: Return if symptoms worsen or fail to improve.   Procedures: No procedures performed      Clinical History: MRI LUMBAR SPINE WITHOUT CONTRAST   TECHNIQUE: Multiplanar, multisequence MR imaging of the lumbar spine was performed. No intravenous contrast was administered.   COMPARISON:  X-ray lumbar 10/18/2021.   FINDINGS: Segmentation:  Standard.   Alignment:  Physiologic.   Vertebrae:  No fracture, evidence of discitis, or bone lesion.   Conus medullaris and cauda equina: Conus extends to the L1 level. Conus and cauda equina appear normal.   Paraspinal and other soft tissues: Negative.   Disc levels:   The T10-L4 disc levels are normal.   L4-5: Small left central disc protrusion without spinal canal or neural foraminal stenosis.   L5-S1: Medium-sized central disc protrusion narrowing the lateral recesses in close proximity to both S1 nerve roots. No central spinal canal stenosis. Mild right neural foraminal stenosis.   IMPRESSION: 1. L5-S1 medium-sized central disc protrusion narrowing the lateral recesses in close proximity to both S1 nerve roots. 2. Small left central disc protrusion at L4-L5 without associated stenosis.     Electronically Signed   By: Ulyses Jarred M.D.   On: 11/22/2021 16:07   She reports that she has quit smoking. Her smoking use included cigarettes. She has a 5.00 pack-year smoking history. She has never used smokeless tobacco. No results for input(s): "HGBA1C", "LABURIC" in the last 8760 hours.  Objective:  VS:  HT:    WT:   BMI:     BP:125/86  HR:90bpm  TEMP: ( )   RESP:  Physical Exam Vitals and nursing note reviewed.  HENT:     Head: Normocephalic and atraumatic.     Right Ear: External ear normal.     Left Ear: External ear normal.     Nose: Nose normal.     Mouth/Throat:     Mouth: Mucous membranes are moist.  Eyes:     Extraocular Movements: Extraocular movements intact.  Cardiovascular:     Rate and Rhythm: Normal rate.     Pulses: Normal pulses.  Pulmonary:     Effort: Pulmonary effort is normal.  Abdominal:     General: Abdomen is flat. There is no distension.  Musculoskeletal:        General: Tenderness present.     Cervical back: Normal range of motion.     Comments: Pt rises from seated position to standing without difficulty. Good lumbar range of motion. Strong distal strength without clonus, no pain upon palpation of greater trochanters. Sensation intact bilaterally. Dysesthesias noted to bilateral S1 dermatomes. Walks independently, gait steady.   Skin:    General: Skin is warm and dry.     Capillary Refill: Capillary refill takes less than  2 seconds.  Neurological:     General: No focal deficit present.     Mental Status: She is alert and oriented to person, place, and time.  Psychiatric:        Mood and Affect: Mood normal.        Behavior: Behavior normal.     Ortho Exam  Imaging: No results found.  Past Medical/Family/Surgical/Social History: Medications & Allergies reviewed per EMR, new medications updated. Patient Active Problem List   Diagnosis Date Noted   Flank pain 02/04/2022   Chronic midline low back pain without sciatica 01/31/2022   Stress incontinence 09/04/2021   Laryngitis 08/30/2021   Right lower quadrant abdominal pain 08/23/2021   Pure hypercholesterolemia 08/21/2020   Morbid obesity ([place]) 11/11/2016   Moderate persistent asthma 06/27/2016   Anxiety state 12/19/2015   Essential hypertension 10/09/2015   CHF (congestive heart failure), NYHA class I, chronic, systolic ([place]) 78/29/5621   GERD  (gastroesophageal reflux disease)    Past Medical History:  Diagnosis Date   Allergy-induced asthma    Allergy induced asthma   Anxiety    Bronchitis    Cancer ([place])    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 EF 40-45% in 2016, varying by several echoes. F/u echo 06/2015 showed EF 50%.    PONV (postoperative nausea and vomiting)    Sinus tachycardia    a. 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 b. 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   Family History  Problem Relation Age of Onset   Asthma Mother    Crohn's disease Mother    Colon polyps Mother    Allergic rhinitis Mother    Heart disease Father        heart failure   Hyperlipidemia Father    Hemophilia Father    COPD Father        smoked   Urticaria Father    Allergic rhinitis Sister    Heart failure Brother    Allergic rhinitis Daughter    Hemophilia Son    Allergic rhinitis Son    Food Allergy Son    Heart failure Paternal Aunt    Heart failure Paternal Uncle    COPD Maternal Grandmother        smoked   Lung cancer Maternal Grandmother        smoked   Cancer Maternal Grandfather        skin   Heart attack Maternal Grandfather    Diabetes Paternal Grandfather    Stroke Paternal Grandfather    Asthma Niece    Hypertension Neg Hx    Eczema Neg Hx    Angioedema Neg Hx    Atopy Neg Hx    Immunodeficiency Neg Hx    Past Surgical History:  Procedure Laterality Date   ABDOMINAL HYSTERECTOMY     CESAREAN SECTION  03-01-2008  and 2016   IM PINNING RIGHT ELBOW FX  1992   HARDWARE REMOVED   LAPAROSCOPIC ASSISTED VAGINAL HYSTERECTOMY Left 02/04/2017   Procedure:  LAPAROSCOPIC ASSISTED VAGINAL HYSTERECTOMY, LEFT SALPINGOOPHORECTOMY;  Surgeon: Dian Queen, MD;  Location: [facility];  Service: Gynecology;  Laterality: Left;  NEED BED   LAPAROSCOPY N/A 07/29/2014   Procedure: LAPAROSCOPY DIAGNOSTIC;  Surgeon: Cyril Mourning, MD;  Location: [facility];  Service: Gynecology;  Laterality: N/A;   OOPHORECTOMY Left 02/04/2017   Procedure: LEFT OOPHORECTOMY;  Surgeon: Dian Queen, MD;  Location: [facility];  Service: Gynecology;  Laterality: Left;   Social History   Occupational History   Occupation: CNA   Tobacco Use   Smoking status: Former    Packs/day: 0.50    Years: 10.00    Total pack years: 5.00    Types: Cigarettes   Smokeless tobacco: Never  Vaping Use   Vaping Use: Some days  Substance and Sexual Activity   Alcohol use: Not Currently   Drug use: No   Sexual activity: Yes    Birth control/protection: Surgical

## 2022-02-15 ENCOUNTER — Encounter: Payer: Self-pay | Admitting: Nurse Practitioner

## 2022-02-18 NOTE — Progress Notes (Signed)
PA STARTED Key: [id number]  DATE: 02/18/22  TIME: 1505  TAT OF DETERMINATION: 24-72 BUSINESS HRS  PAT NOTIFIED?  When determination is made

## 2022-03-11 DIAGNOSIS — F411 Generalized anxiety disorder: Secondary | ICD-10-CM | POA: Diagnosis not present

## 2022-03-11 DIAGNOSIS — F5101 Primary insomnia: Secondary | ICD-10-CM | POA: Diagnosis not present

## 2022-03-11 DIAGNOSIS — F3189 Other bipolar disorder: Secondary | ICD-10-CM | POA: Diagnosis not present

## 2022-03-13 NOTE — Progress Notes (Deleted)
   Established Patient Office Visit  Subjective   Patient ID: Michelle Kane, female    DOB: 1985/10/29  Age: 36 y.o. MRN: [id number]  No chief complaint on file.   HPI  Michelle Kane is here to follow-up on weight management. Last visit she was started on Wegovy injections weekly.   {History (Optional):23778}  ROS    Objective:     LMP 12/09/2016  Wt Readings from Last 3 Encounters:  02/04/22 288 lb 12.8 oz (131 kg)  01/31/22 289 lb 6.4 oz (131.3 kg)  11/30/21 280 lb (127 kg)      Physical Exam   No results found for any visits on 03/14/22.  {Labs (Optional):23779}  The ASCVD Risk score (Arnett DK, et al., 2019) failed to calculate for the following reasons:   The 2019 ASCVD risk score is only valid for ages 41 to 63    Assessment & Plan:   Problem List Items Addressed This Visit   None   No follow-ups on file.    Charyl Dancer, NP

## 2022-03-14 ENCOUNTER — Ambulatory Visit: Payer: BC Managed Care – PPO | Admitting: Nurse Practitioner

## 2022-03-14 ENCOUNTER — Telehealth: Payer: Self-pay | Admitting: Nurse Practitioner

## 2022-03-14 NOTE — Telephone Encounter (Signed)
Noted  

## 2022-03-14 NOTE — Telephone Encounter (Signed)
1st no show, fee waived, letter sent 

## 2022-03-14 NOTE — Telephone Encounter (Signed)
8.31.23 no show letter sent

## 2022-03-19 ENCOUNTER — Encounter: Payer: Self-pay | Admitting: Nurse Practitioner

## 2022-03-25 ENCOUNTER — Encounter: Payer: Self-pay | Admitting: Nurse Practitioner

## 2022-03-25 DIAGNOSIS — U071 COVID-19: Secondary | ICD-10-CM

## 2022-03-26 ENCOUNTER — Telehealth (INDEPENDENT_AMBULATORY_CARE_PROVIDER_SITE_OTHER): Payer: BC Managed Care – PPO | Admitting: Nurse Practitioner

## 2022-03-26 ENCOUNTER — Encounter: Payer: Self-pay | Admitting: Nurse Practitioner

## 2022-03-26 VITALS — Temp 102.0°F

## 2022-03-26 DIAGNOSIS — U071 COVID-19: Secondary | ICD-10-CM

## 2022-03-26 MED ORDER — NIRMATRELVIR/RITONAVIR (PAXLOVID)TABLET: 3.0000 | ORAL_TABLET | Freq: Two times a day (BID) | ORAL | 0 refills | Status: AC

## 2022-03-26 NOTE — Patient Instructions (Signed)
It was great to see you!  Continue alternating ibuprofen and tylenol for your fever. Make sure you are drinking plenty of fluids and getting rest. Try to isolate for until Friday and wear a mask if in public for the next 10 days.   Let's follow-up if your symptoms worsen or don't improve.   Take care,  Vance Peper, NP

## 2022-03-26 NOTE — Progress Notes (Signed)
[facility] [address] [place] [address] Dept: [phone] Dept Fax: [phone]  Telephone Visit  I connected with Michelle Kane on 03/26/22 at 11:00 AM EDT by telephone and verified that I am speaking with the correct person using two identifiers.  Location patient: Home Location provider: Clinic Persons participating in the virtual visit: Patient; Vance Peper, NP; Marchia Bond, CMA  I discussed the limitations of evaluation and management by telemedicine and the availability of in person appointments. The patient expressed understanding and agreed to proceed.  Chief Complaint  Patient presents with   Covid Positive    Pt tested pos for Covid 03/25/22. Pt states she has fever, nasal congestion, sore throat, cough, diarrhea, and ear pain x1 day    SUBJECTIVE:  HPI: Michelle Kane is a 36 y.o. female who presents with fever, body aches and cough that started yesterday. She had a positive home covid-19 test yesterday.   UPPER RESPIRATORY TRACT INFECTION  Fever: yes Cough: yes Shortness of breath: no Wheezing: yes Chest pain: no Chest tightness: no Chest congestion: no Nasal congestion: yes Runny nose: yes Post nasal drip: yes Sneezing: yes Sore throat: yes Swollen glands: yes Sinus pressure: yes Headache: yes Face pain: yes Toothache: no Ear pain: yes bilateral Ear pressure: yes bilateral Eyes red/itching:no Eye drainage/crusting: yes  Vomiting: no Rash: no Fatigue: yes Sick contacts: yes Strep contacts: no  Context: stable Recurrent sinusitis: no Relief with OTC cold/cough medications: no  Treatments attempted: ibuprofen, tylenol    Past Medical History:  Diagnosis Date   Allergy-induced asthma    Allergy induced asthma   Anxiety    Bronchitis    Cancer (HCC)    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 EF 40-45% in 2016, varying by several echoes. F/u echo 06/2015 showed EF 50%.    PONV (postoperative nausea and vomiting)    Sinus tachycardia    a. 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 b. 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    Past Surgical History:  Procedure Laterality Date   ABDOMINAL HYSTERECTOMY     CESAREAN SECTION  03-01-2008  and 2016   IM PINNING RIGHT ELBOW FX  1992   HARDWARE REMOVED   LAPAROSCOPIC ASSISTED VAGINAL HYSTERECTOMY Left 02/04/2017   Procedure: LAPAROSCOPIC ASSISTED VAGINAL HYSTERECTOMY, LEFT SALPINGOOPHORECTOMY;  Surgeon: Dian Queen, MD;  Location: [place];  Service: Gynecology;  Laterality: Left;  NEED BED   LAPAROSCOPY N/A 07/29/2014   Procedure: LAPAROSCOPY DIAGNOSTIC;  Surgeon: Cyril Mourning, MD;  Location: [facility];  Service: Gynecology;  Laterality: N/A;   OOPHORECTOMY Left 02/04/2017   Procedure: LEFT OOPHORECTOMY;  Surgeon: Dian Queen, MD;  Location: [facility];  Service: Gynecology;  Laterality: Left;    Family History  Problem Relation Age of Onset   Asthma Mother    Crohn's disease Mother    Colon polyps Mother    Allergic rhinitis Mother    Heart disease Father        heart failure  Hyperlipidemia Father    Hemophilia Father    COPD Father        smoked   Urticaria Father    Allergic rhinitis Sister    Heart failure Brother    Allergic rhinitis Daughter    Hemophilia Son    Allergic rhinitis Son    Food Allergy Son    Heart failure Paternal Aunt    Heart failure Paternal Uncle    COPD Maternal Grandmother        smoked   Lung cancer Maternal Grandmother        smoked    Cancer Maternal Grandfather        skin   Heart attack Maternal Grandfather    Diabetes Paternal Grandfather    Stroke Paternal Grandfather    Asthma Niece    Hypertension Neg Hx    Eczema Neg Hx    Angioedema Neg Hx    Atopy Neg Hx    Immunodeficiency Neg Hx     Social History   Tobacco Use   Smoking status: Former    Packs/day: 0.50    Years: 10.00    Total pack years: 5.00    Types: Cigarettes   Smokeless tobacco: Never  Vaping Use   Vaping Use: Some days  Substance Use Topics   Alcohol use: Not Currently   Drug use: No     Current Outpatient Medications:    nirmatrelvir/ritonavir EUA (PAXLOVID) 20 x 150 MG & 10 x '100MG'$  TABS, Take 3 tablets by mouth 2 (two) times daily for 5 days. (Take nirmatrelvir 150 mg two tablets twice daily for 5 days and ritonavir 100 mg one tablet twice daily for 5 days) Patient GFR is >60, Disp: 30 tablet, Rfl: 0   albuterol (VENTOLIN HFA) 108 (90 Base) MCG/ACT inhaler, Inhale 2 puffs into the lungs every 4 (four) hours as needed for wheezing or shortness of breath., Disp: 18 g, Rfl: 1   azelastine (ASTELIN) 0.1 % nasal spray, Place 1-2 sprays into both nostrils 2 (two) times daily as needed., Disp: 30 mL, Rfl: 5   budesonide-formoterol (SYMBICORT) 160-4.5 MCG/ACT inhaler, 2 puffs twice daily with spacer to prevent coughing or wheezing. Rinse,gargle and spit after use., Disp: 1 each, Rfl: 5   fexofenadine (ALLEGRA) 60 MG tablet, Take 60 mg by mouth 2 (two) times daily., Disp: , Rfl:    Fluticasone Propionate (XHANCE) 93 MCG/ACT EXHU, 2 sprays per nostril twice a day if needed for stuffy nose., Disp: 32 mL, Rfl: 5   levocetirizine (XYZAL) 5 MG tablet, Take 5 mg by mouth in the morning and at bedtime., Disp: , Rfl:    LORazepam (ATIVAN) 1 MG tablet, Take 1 mg by mouth every 6 (six) hours as needed for anxiety., Disp: , Rfl:    pantoprazole (PROTONIX) 40 MG tablet, Take 1 tablet (40 mg total) by mouth daily. Take 30-60 min before first meal of the day,  Disp: 90 tablet, Rfl: 3   Semaglutide-Weight Management (WEGOVY) 0.25 MG/0.5ML SOAJ, Inject 0.25 mg into the skin once a week., Disp: 2 mL, Rfl: 0  Allergies  Allergen Reactions   Buspar [Buspirone] Other (See Comments)    Mouth, lips tingling, throat tightness, chest pain   Vicodin [Hydrocodone-Acetaminophen] Itching, Nausea And Vomiting and Other (See Comments)    GI pains also   Labetalol     Causes congestive heart failure   Zyrtec [Cetirizine]     Worsening anxiety    ROS: See pertinent positives and negatives per  HPI.  OBSERVATIONS/OBJECTIVE:  VITALS per patient if applicable: There were no vitals filed for this visit.   ASSESSMENT AND PLAN:  Problem List Items Addressed This Visit       Other   COVID-19 - Primary    Positive covid-19 test with symptoms that started yesterday. With history of obesity and asthma, will start paxlovid BID x5 days. Discussed possible side effects. Encourage to alternate tylenol and ibuprofen for fever/pain. Encouraged rest and fluids.   Reviewed home care instructions for COVID. Advised self-isolation at home for at least 5 days. After 5 days, if improved and fever resolved, can be in public, but should wear a mask around others for an additional 5 days. If symptoms, esp, dyspnea develops/worsens, recommend in-person evaluation at either an urgent care or the emergency room.       Relevant Medications   nirmatrelvir/ritonavir EUA (PAXLOVID) 20 x 150 MG & 10 x '100MG'$  TABS    I discussed the assessment and treatment plan with the patient. The patient was provided an opportunity to ask questions and all were answered. The patient agreed with the plan and demonstrated an understanding of the instructions.   The patient was advised to call back or seek an in-person evaluation if the symptoms worsen or if the condition fails to improve as anticipated.  I spent 20 minutes on this telephone encounter.  Charyl Dancer, NP

## 2022-03-26 NOTE — Assessment & Plan Note (Signed)
Positive covid-19 test with symptoms that started yesterday. With history of obesity and asthma, will start paxlovid BID x5 days. Discussed possible side effects. Encourage to alternate tylenol and ibuprofen for fever/pain. Encouraged rest and fluids.   Reviewed home care instructions for COVID. Advised self-isolation at home for at least 5 days. After 5 days, if improved and fever resolved, can be in public, but should wear a mask around others for an additional 5 days. If symptoms, esp, dyspnea develops/worsens, recommend in-person evaluation at either an urgent care or the emergency room.

## 2022-03-27 ENCOUNTER — Ambulatory Visit (INDEPENDENT_AMBULATORY_CARE_PROVIDER_SITE_OTHER): Payer: BC Managed Care – PPO

## 2022-03-27 DIAGNOSIS — U071 COVID-19: Secondary | ICD-10-CM

## 2022-03-27 LAB — POC COVID19 BINAXNOW: SARS Coronavirus 2 Ag: POSITIVE — AB

## 2022-03-27 NOTE — Progress Notes (Signed)
Pt is here for covid swab. Pt swabbed in both nostrils and told to wait for 15 mins for results. Sw, cma

## 2022-04-18 ENCOUNTER — Ambulatory Visit: Payer: Self-pay

## 2022-04-18 ENCOUNTER — Ambulatory Visit (INDEPENDENT_AMBULATORY_CARE_PROVIDER_SITE_OTHER): Payer: BC Managed Care – PPO | Admitting: Physical Medicine and Rehabilitation

## 2022-04-18 DIAGNOSIS — M5416 Radiculopathy, lumbar region: Secondary | ICD-10-CM | POA: Diagnosis not present

## 2022-04-18 MED ORDER — METHYLPREDNISOLONE ACETATE 80 MG/ML IJ SUSP
80.0000 mg | Freq: Once | INTRAMUSCULAR | Status: AC
  Administered 2022-04-18: 80 mg

## 2022-04-18 NOTE — Progress Notes (Signed)
Patient present for planned bilateral S1 TF ESI.  Patient reports worsening lower back pain that spreads across her back. Denies any radicular leg pain.  Does state has occasional numbness and tingling in her feet.

## 2022-04-18 NOTE — Patient Instructions (Signed)

## 2022-04-29 NOTE — Procedures (Signed)
S1 Lumbosacral Transforaminal Epidural Steroid Injection - Sub-Pedicular Approach with Fluoroscopic Guidance   Patient: Michelle Kane      Date of Birth: 09-26-1985 MRN: [id number] PCP: Charyl Dancer, NP      Visit Date: 04/18/2022   Universal Protocol:    Date/Time: 10/16/236:45 AM  Consent Given By: the patient  Position:  PRONE  Additional Comments: Vital signs were monitored before and after the procedure. Patient was prepped and draped in the usual sterile fashion. The correct patient, procedure, and site was verified.   Injection Procedure Details:  Procedure Site One Meds Administered:  Meds ordered this encounter  Medications   methylPREDNISolone acetate (DEPO-MEDROL) injection 80 mg    Laterality: Bilateral  Location/Site:  S1 Foramen   Needle size: 22 ga.  Needle type: Spinal  Needle Placement: Transforaminal  Findings:   -Comments: Excellent flow of contrast along the nerve, nerve root and into the epidural space.  Epidurogram: Contrast epidurogram showed no nerve root cut off or restricted flow pattern.  Procedure Details: After squaring off the sacral end-plate to get a true AP view, the C-arm was positioned so that the best possible view of the S1 foramen was visualized. The soft tissues overlying this structure were infiltrated with 2-3 ml. of 1% Lidocaine without Epinephrine.    The spinal needle was inserted toward the target using a "trajectory" view along the fluoroscope beam.  Under AP and lateral visualization, the needle was advanced so it did not puncture dura. Biplanar projections were used to confirm position. Aspiration was confirmed to be negative for CSF and/or blood. A 1-2 ml. volume of Isovue-250 was injected and flow of contrast was noted at each level. Radiographs were obtained for documentation purposes.   After attaining the desired flow of contrast documented above, a 0.5 to 1.0 ml test dose of 0.25% Marcaine was injected into  each respective transforaminal space.  The patient was observed for 90 seconds post injection.  After no sensory deficits were reported, and normal lower extremity motor function was noted,   the above injectate was administered so that equal amounts of the injectate were placed at each foramen (level) into the transforaminal epidural space.   Additional Comments:  The patient tolerated the procedure well Dressing: Band-Aid with 2 x 2 sterile gauze    Post-procedure details: Patient was observed during the procedure. Post-procedure instructions were reviewed.  Patient left the clinic in stable condition.

## 2022-04-29 NOTE — Progress Notes (Signed)
Michelle Kane - 36 y.o. female MRN [id number]  Date of birth: February 01, 1986  Office Visit Note: Visit Date: 04/18/2022 PCP: Charyl Dancer, NP Referred by: Charyl Dancer, NP  Subjective: Chief Complaint  Patient presents with   Lower Back - Pain   HPI:  Michelle Kane is a 36 y.o. female who comes in today at the request of Barnet Pall, FNP for planned Bilateral S1-2 Lumbar Transforaminal epidural steroid injection with fluoroscopic guidance.  The patient has failed conservative care including home exercise, medications, time and activity modification.  This injection will be diagnostic and hopefully therapeutic.  Please see requesting physician notes for further details and justification.   ROS Otherwise per HPI.  Assessment & Plan: Visit Diagnoses:    ICD-10-CM   1. Lumbar radiculopathy  M54.16 XR C-ARM NO REPORT    Epidural Steroid injection    methylPREDNISolone acetate (DEPO-MEDROL) injection 80 mg      Plan: No additional findings.   Meds & Orders:  Meds ordered this encounter  Medications   methylPREDNISolone acetate (DEPO-MEDROL) injection 80 mg    Orders Placed This Encounter  Procedures   XR C-ARM NO REPORT   Epidural Steroid injection    Follow-up: Return for visit to requesting provider as needed.   Procedures: No procedures performed  S1 Lumbosacral Transforaminal Epidural Steroid Injection - Sub-Pedicular Approach with Fluoroscopic Guidance   Patient: Michelle Kane      Date of Birth: 1986/04/13 MRN: [id number] PCP: Charyl Dancer, NP      Visit Date: 04/18/2022   Universal Protocol:    Date/Time: 10/16/236:45 AM  Consent Given By: the patient  Position:  PRONE  Additional Comments: Vital signs were monitored before and after the procedure. Patient was prepped and draped in the usual sterile fashion. The correct patient, procedure, and site was verified.   Injection Procedure Details:  Procedure Site One Meds Administered:   Meds ordered this encounter  Medications   methylPREDNISolone acetate (DEPO-MEDROL) injection 80 mg    Laterality: Bilateral  Location/Site:  S1 Foramen   Needle size: 22 ga.  Needle type: Spinal  Needle Placement: Transforaminal  Findings:   -Comments: Excellent flow of contrast along the nerve, nerve root and into the epidural space.  Epidurogram: Contrast epidurogram showed no nerve root cut off or restricted flow pattern.  Procedure Details: After squaring off the sacral end-plate to get a true AP view, the C-arm was positioned so that the best possible view of the S1 foramen was visualized. The soft tissues overlying this structure were infiltrated with 2-3 ml. of 1% Lidocaine without Epinephrine.    The spinal needle was inserted toward the target using a "trajectory" view along the fluoroscope beam.  Under AP and lateral visualization, the needle was advanced so it did not puncture dura. Biplanar projections were used to confirm position. Aspiration was confirmed to be negative for CSF and/or blood. A 1-2 ml. volume of Isovue-250 was injected and flow of contrast was noted at each level. Radiographs were obtained for documentation purposes.   After attaining the desired flow of contrast documented above, a 0.5 to 1.0 ml test dose of 0.25% Marcaine was injected into each respective transforaminal space.  The patient was observed for 90 seconds post injection.  After no sensory deficits were reported, and normal lower extremity motor function was noted,   the above injectate was administered so that equal amounts of the injectate were placed at each foramen (level) into the  transforaminal epidural space.   Additional Comments:  The patient tolerated the procedure well Dressing: Band-Aid with 2 x 2 sterile gauze    Post-procedure details: Patient was observed during the procedure. Post-procedure instructions were reviewed.  Patient left the clinic in stable condition.    Clinical History: MRI LUMBAR SPINE WITHOUT CONTRAST   TECHNIQUE: Multiplanar, multisequence MR imaging of the lumbar spine was performed. No intravenous contrast was administered.   COMPARISON:  X-ray lumbar 10/18/2021.   FINDINGS: Segmentation:  Standard.   Alignment:  Physiologic.   Vertebrae:  No fracture, evidence of discitis, or bone lesion.   Conus medullaris and cauda equina: Conus extends to the L1 level. Conus and cauda equina appear normal.   Paraspinal and other soft tissues: Negative.   Disc levels:   The T10-L4 disc levels are normal.   L4-5: Small left central disc protrusion without spinal canal or neural foraminal stenosis.   L5-S1: Medium-sized central disc protrusion narrowing the lateral recesses in close proximity to both S1 nerve roots. No central spinal canal stenosis. Mild right neural foraminal stenosis.   IMPRESSION: 1. L5-S1 medium-sized central disc protrusion narrowing the lateral recesses in close proximity to both S1 nerve roots. 2. Small left central disc protrusion at L4-L5 without associated stenosis.     Electronically Signed   By: Ulyses Jarred M.D.   On: 11/22/2021 16:07     Objective:  VS:  HT:    WT:   BMI:     BP:   HR: bpm  TEMP: ( )  RESP:  Physical Exam Vitals and nursing note reviewed.  Constitutional:      General: She is not in acute distress.    Appearance: Normal appearance. She is obese. She is not ill-appearing.  HENT:     Head: Normocephalic and atraumatic.     Right Ear: External ear normal.     Left Ear: External ear normal.  Eyes:     Extraocular Movements: Extraocular movements intact.  Cardiovascular:     Rate and Rhythm: Normal rate.     Pulses: Normal pulses.  Pulmonary:     Effort: Pulmonary effort is normal. No respiratory distress.  Abdominal:     General: There is no distension.     Palpations: Abdomen is soft.  Musculoskeletal:        General: Tenderness present.     Cervical  back: Neck supple.     Right lower leg: No edema.     Left lower leg: No edema.     Comments: Patient has good distal strength with no pain over the greater trochanters.  No clonus or focal weakness.  Skin:    Findings: No erythema, lesion or rash.  Neurological:     General: No focal deficit present.     Mental Status: She is alert and oriented to person, place, and time.     Sensory: No sensory deficit.     Motor: No weakness or abnormal muscle tone.     Coordination: Coordination normal.  Psychiatric:        Mood and Affect: Mood normal.        Behavior: Behavior normal.      Imaging: No results found.

## 2022-07-16 ENCOUNTER — Encounter: Payer: Self-pay | Admitting: Family Medicine

## 2022-07-16 ENCOUNTER — Other Ambulatory Visit: Payer: Self-pay | Admitting: Nurse Practitioner

## 2022-07-16 ENCOUNTER — Ambulatory Visit (INDEPENDENT_AMBULATORY_CARE_PROVIDER_SITE_OTHER): Payer: BC Managed Care – PPO | Admitting: Family Medicine

## 2022-07-16 VITALS — BP 122/66 | HR 114 | Temp 97.8°F | Ht 66.75 in | Wt 295.2 lb

## 2022-07-16 DIAGNOSIS — J069 Acute upper respiratory infection, unspecified: Secondary | ICD-10-CM

## 2022-07-16 LAB — POCT INFLUENZA A/B
Influenza A, POC: NEGATIVE
Influenza B, POC: NEGATIVE

## 2022-07-16 LAB — POC COVID19 BINAXNOW: SARS Coronavirus 2 Ag: NEGATIVE

## 2022-07-16 MED ORDER — GUAIFENESIN-CODEINE 100-10 MG/5ML PO SOLN: 5.0000 mL | Freq: Three times a day (TID) | ORAL | 0 refills | Status: DC | PRN

## 2022-07-16 NOTE — Progress Notes (Signed)
[facility] [address] [place] [address] Dept: [phone] Dept Fax: [phone]  Office Visit  Subjective:    Patient ID: Michelle Kane, female    DOB: Jul 10, 1986, 37 y.o..   MRN: [id number]  Chief Complaint  Patient presents with   Acute Visit    C/o having lost voice, persistent cough, chest congestion, SOB x 4 days.  Has taken Tylenol Sinus Cold/Flu.  No home covid test.      History of Present Illness:  Patient is in today for with a 4-day history of hoarseness, cough, chest congestion and some dyspnea. She notes it is not infrequent for her to have issues with hoarseness. Shew works from home, so is not exposed to sick people very much. She has been using her daily Symbicort and is using her albuterol some currently. She is not on her allergy meds at present.  Past Medical History: Patient Active Problem List   Diagnosis Date Noted   COVID-19 03/26/2022   Flank pain 02/04/2022   Chronic midline low back pain without sciatica 01/31/2022   Stress incontinence 09/04/2021   Laryngitis 08/30/2021   Right lower quadrant abdominal pain 08/23/2021   Pure hypercholesterolemia 08/21/2020   Morbid obesity ([place]) 11/11/2016   Moderate persistent asthma 06/27/2016   Anxiety state 12/19/2015   Essential hypertension 10/09/2015   CHF (congestive heart failure), NYHA class I, chronic, systolic ([place]) 09/62/8366   GERD (gastroesophageal reflux disease)    Past Surgical History:  Procedure Laterality Date   ABDOMINAL HYSTERECTOMY     CESAREAN SECTION  03-01-2008  and 2016   IM PINNING RIGHT ELBOW FX  1992   HARDWARE REMOVED   LAPAROSCOPIC ASSISTED VAGINAL HYSTERECTOMY Left 02/04/2017   Procedure: LAPAROSCOPIC ASSISTED VAGINAL HYSTERECTOMY, LEFT SALPINGOOPHORECTOMY;  Surgeon: Dian Queen, MD;  Location: [facility];  Service: Gynecology;  Laterality: Left;  NEED BED   LAPAROSCOPY N/A 07/29/2014    Procedure: LAPAROSCOPY DIAGNOSTIC;  Surgeon: Cyril Mourning, MD;  Location[facility];  Service: Gynecology;  Laterality: N/A;   OOPHORECTOMY Left 02/04/2017   Procedure: LEFT OOPHORECTOMY;  Surgeon: Dian Queen, MD;  Location[facility];  Service: Gynecology;  Laterality: Left;   Family History  Problem Relation Age of Onset   Asthma Mother    Crohn's disease Mother    Colon polyps Mother    Allergic rhinitis Mother    Heart disease Father        heart failure   Hyperlipidemia Father    Hemophilia Father    COPD Father        smoked   Urticaria Father    Allergic rhinitis Sister    Heart failure Brother    Allergic rhinitis Daughter    Hemophilia Son    Allergic rhinitis Son    Food Allergy Son    Heart failure Paternal Aunt    Heart failure Paternal Uncle    COPD Maternal Grandmother        smoked   Lung cancer Maternal Grandmother        smoked   Cancer Maternal Grandfather        skin   Heart attack Maternal Grandfather    Diabetes Paternal Grandfather    Stroke Paternal Grandfather    Asthma Niece    Hypertension Neg Hx    Eczema Neg Hx    Angioedema Neg Hx    Atopy Neg Hx    Immunodeficiency Neg Hx  Outpatient Medications Prior to Visit  Medication Sig Dispense Refill   albuterol (VENTOLIN HFA) 108 (90 Base) MCG/ACT inhaler Inhale 2 puffs into the lungs every 4 (four) hours as needed for wheezing or shortness of breath. 18 g 1   azelastine (ASTELIN) 0.1 % nasal spray Place 1-2 sprays into both nostrils 2 (two) times daily as needed. 30 mL 5   budesonide-formoterol (SYMBICORT) 160-4.5 MCG/ACT inhaler 2 puffs twice daily with spacer to prevent coughing or wheezing. Rinse,gargle and spit after use. 1 each 5   fexofenadine (ALLEGRA) 60 MG tablet Take 60 mg by mouth 2 (two) times daily.     Fluticasone Propionate (XHANCE) 93 MCG/ACT EXHU 2 sprays per nostril twice a day if needed for stuffy nose. 32 mL 5   levocetirizine  (XYZAL) 5 MG tablet Take 5 mg by mouth in the morning and at bedtime.     LORazepam (ATIVAN) 1 MG tablet Take 1 mg by mouth every 6 (six) hours as needed for anxiety.     pantoprazole (PROTONIX) 40 MG tablet Take 1 tablet (40 mg total) by mouth daily. Take 30-60 min before first meal of the day 90 tablet 3   Semaglutide-Weight Management (WEGOVY) 0.25 MG/0.5ML SOAJ Inject 0.25 mg into the skin once a week. (Patient not taking: Reported on 07/16/2022) 2 mL 0   No facility-administered medications prior to visit.    Allergies  Allergen Reactions   Buspar [Buspirone] Other (See Comments)    Mouth, lips tingling, throat tightness, chest pain   Vicodin [Hydrocodone-Acetaminophen] Itching, Nausea And Vomiting and Other (See Comments)    GI pains also   Labetalol     Causes congestive heart failure   Zyrtec [Cetirizine]     Worsening anxiety     Objective:   Today's Vitals   07/16/22 1318  BP: 122/66  Pulse: (!) 114  Temp: 97.8 F (36.6 C)  TempSrc: Temporal  SpO2: 97%  Weight: 295 lb 3.2 oz (133.9 kg)  Height: 5' 6.75" (1.695 m)   Body mass index is 46.58 kg/m.   General: Well developed, well nourished. No acute distress. HEENT: Normocephalic, non-traumatic. PERRL, EOMI. Conjunctiva clear. External ears normal. EAC and   TMs normal bilaterally. Nose clear without congestion or rhinorrhea. No pain on percussion over   sinuses. Mucous membranes moist. Oropharynx with mild cobblestoning. Good dentition. Neck: Supple. No lymphadenopathy. No thyromegaly. Lungs: Clear to auscultation bilaterally. No wheezing, rales or rhonchi. Psych: Alert and oriented. Normal mood and affect.  There are no preventive care reminders to display for this patient.  Lab Results POCT Covid: Neg. POCT Influenza A& B: Neg.    Assessment & Plan:   1. Viral URI with cough Discussed home care for viral illness, including rest, pushing fluids, and OTC medications as needed for symptom relief. Recommend hot  tea with honey for sore throat symptoms. Follow-up if needed for worsening or persistent symptoms.  - POC COVID-19 - POCT Influenza A/B   Return if symptoms worsen or fail to improve.   Haydee Salter, MD

## 2022-07-17 ENCOUNTER — Ambulatory Visit: Payer: BC Managed Care – PPO | Admitting: Family Medicine

## 2022-07-19 ENCOUNTER — Encounter: Payer: Self-pay | Admitting: Nurse Practitioner

## 2022-07-25 ENCOUNTER — Other Ambulatory Visit: Payer: Self-pay | Admitting: Nurse Practitioner

## 2022-07-25 MED ORDER — FLUTICASONE-SALMETEROL 45-21 MCG/ACT IN AERO: 2.0000 | INHALATION_SPRAY | Freq: Two times a day (BID) | RESPIRATORY_TRACT | 12 refills | Status: DC

## 2022-08-15 ENCOUNTER — Encounter: Payer: Self-pay | Admitting: Physical Medicine and Rehabilitation

## 2022-08-19 ENCOUNTER — Telehealth: Payer: Self-pay

## 2022-08-19 NOTE — Telephone Encounter (Signed)
-----   Message from Lorine Bears, NP sent at 08/18/2022  6:58 PM EST ----- Can we set her up for office visit, new and more severe symptoms. Thanks

## 2022-08-19 NOTE — Telephone Encounter (Signed)
Spoke with patient and scheduled OV for 08/21/22

## 2022-08-21 ENCOUNTER — Ambulatory Visit (INDEPENDENT_AMBULATORY_CARE_PROVIDER_SITE_OTHER): Payer: BC Managed Care – PPO | Admitting: Physical Medicine and Rehabilitation

## 2022-08-21 ENCOUNTER — Encounter: Payer: Self-pay | Admitting: Physical Medicine and Rehabilitation

## 2022-08-21 DIAGNOSIS — M5416 Radiculopathy, lumbar region: Secondary | ICD-10-CM | POA: Diagnosis not present

## 2022-08-21 DIAGNOSIS — M47896 Other spondylosis, lumbar region: Secondary | ICD-10-CM

## 2022-08-21 DIAGNOSIS — M4726 Other spondylosis with radiculopathy, lumbar region: Secondary | ICD-10-CM

## 2022-08-21 DIAGNOSIS — M5116 Intervertebral disc disorders with radiculopathy, lumbar region: Secondary | ICD-10-CM

## 2022-08-21 MED ORDER — DULOXETINE HCL 30 MG PO CPEP: ORAL_CAPSULE | ORAL | 0 refills | Status: DC

## 2022-08-21 NOTE — Progress Notes (Signed)
Michelle Kane - 37 y.o. female MRN [id number]  Date of birth: 1986/02/14  Office Visit Note: Visit Date: 08/21/2022 PCP: Charyl Dancer, NP Referred by: Charyl Dancer, NP  Subjective: Chief Complaint  Patient presents with   Lower Back - Pain   HPI: Michelle Kane is a 37 y.o. female who comes in today for evaluation of chronic, worsening and severe bilateral lower back pain radiating to buttocks, hips and down right anterolateral leg to knee. Pain ongoing for several years, worsens with movement, activity and walking. Some relief of pain with home exercise regimen, heating pad, rest and use of medications. Patient is tearful during our visit today as she feels treatments are not helping and her pain has increased. Patient did attend formal physical therapy at [facility] in 2022, reports no relief of pain with these treatments. Lumbar MRI imaging from May of 2023 exhibits medium-sized central disc protrusion narrowing the lateral recesses in close proximity to both S1 nerve roots at L5-S1. No high grade spinal canal stenosis noted. Patient has undergone multiple lumbar injections in our office, including both transforaminal and interlaminar approach, minimal short term relief with these procedures. Most recent bilateral S1 transforaminal epidural steroid injection in October of 2023. Patient previously treated by Dr. Rodell Perna in our practice, per his notes he recommended weight loss before proceeding with any type of surgical treatment. He did refer her to [facility], however appointment was never scheduled. Did try Wegovy injections with her primary care provider Vance Peper, NP, however she is not taking at this time. Patient reports recent increased stress, she is sole caregiver for her autistic son. Also states she is working full time from home with [employer] Patient denies focal weakness, numbness and tingling. No recent trauma or falls.       Review of Systems  Musculoskeletal:  Positive for back pain.  Neurological:  Negative for tingling, sensory change, focal weakness and weakness.  All other systems reviewed and are negative.  Otherwise per HPI.  Assessment & Plan: Visit Diagnoses:    ICD-10-CM   1. Lumbar radiculopathy  M54.16 Ambulatory referral to Orthopedic Surgery    2. Intervertebral disc disorders with radiculopathy, lumbar region  M51.16 Ambulatory referral to Orthopedic Surgery    3. Other spondylosis with radiculopathy, lumbar region  M47.26 Ambulatory referral to Orthopedic Surgery    4. Morbid (severe) obesity due to excess calories (HCC)  E66.01 Ambulatory referral to Orthopedic Surgery       Plan: Findings:  Chronic, worsening and severe bilateral lower back pain radiating to buttocks, hips and down right anterolateral leg to knee. Patient continues to have severe pain despite good conservative therapies such as formal physical therapy, heating pad, rest and use of medications. Previous lumbar epidural steroid injections provided minimal short term relief of pain. Patients clinical presentation and exam are consistent with L5 nerve pattern. At this point, we do not recommend repeating interventional spine procedures. We did discuss other treatment options such as re-grouping with physical therapy and medication management. I did place prescription for Cymbalta today, to her knowledge she has tried this medication in past for more depression/anxiety and tolerated without issues. I am starting her at 30 mg of Cymbalta once a day for 2 weeks, if tolerating well can increase to twice a day. I also discussed surgical referral to Dr. Ileene Rubens, I am concerned patient is not an ideal surgical candidate due to her weight, however  she is willing to try weight management program again. Patient has no questions regarding plan. We will see her back as needed. No red flag symptoms noted upon exam today.     Meds &  Orders:  Meds ordered this encounter  Medications   DULoxetine (CYMBALTA) 30 MG capsule    Sig: Take 1 capsule (30 mg total) once a day by mouth for 2 weeks, then take 1 capsule (30 mg) twice a day.    Dispense:  60 capsule    Refill:  0    Orders Placed This Encounter  Procedures   Ambulatory referral to Orthopedic Surgery    Follow-up: Return if symptoms worsen or fail to improve.   Procedures: No procedures performed      Clinical History: MRI LUMBAR SPINE WITHOUT CONTRAST   TECHNIQUE: Multiplanar, multisequence MR imaging of the lumbar spine was performed. No intravenous contrast was administered.   COMPARISON:  X-ray lumbar 10/18/2021.   FINDINGS: Segmentation:  Standard.   Alignment:  Physiologic.   Vertebrae:  No fracture, evidence of discitis, or bone lesion.   Conus medullaris and cauda equina: Conus extends to the L1 level. Conus and cauda equina appear normal.   Paraspinal and other soft tissues: Negative.   Disc levels:   The T10-L4 disc levels are normal.   L4-5: Small left central disc protrusion without spinal canal or neural foraminal stenosis.   L5-S1: Medium-sized central disc protrusion narrowing the lateral recesses in close proximity to both S1 nerve roots. No central spinal canal stenosis. Mild right neural foraminal stenosis.   IMPRESSION: 1. L5-S1 medium-sized central disc protrusion narrowing the lateral recesses in close proximity to both S1 nerve roots. 2. Small left central disc protrusion at L4-L5 without associated stenosis.     Electronically Signed   By: Ulyses Jarred M.D.   On: 11/22/2021 16:07   She reports that she has quit smoking. Her smoking use included cigarettes. She has a 5.00 pack-year smoking history. She has never used smokeless tobacco. No results for input(s): "HGBA1C", "LABURIC" in the last 8760 hours.  Objective:  VS:  HT:    WT:   BMI:     BP:   HR: bpm  TEMP: ( )  RESP:  Physical Exam Vitals and  nursing note reviewed.  HENT:     Head: Normocephalic and atraumatic.     Right Ear: External ear normal.     Left Ear: External ear normal.     Nose: Nose normal.     Mouth/Throat:     Mouth: Mucous membranes are moist.  Eyes:     Extraocular Movements: Extraocular movements intact.  Cardiovascular:     Rate and Rhythm: Normal rate.     Pulses: Normal pulses.  Pulmonary:     Effort: Pulmonary effort is normal.  Abdominal:     General: Abdomen is flat. There is no distension.  Musculoskeletal:        General: Tenderness present.     Cervical back: Normal range of motion.     Comments: Pt rises from seated position to standing without difficulty. Good lumbar range of motion. Strong distal strength without clonus, no pain upon palpation of greater trochanters. Sensation intact bilaterally. Dysesthesias noted to right L5 dermatome. Walks independently, gait steady.   Skin:    General: Skin is warm and dry.     Capillary Refill: Capillary refill takes less than 2 seconds.  Neurological:     General: No focal deficit present.  Mental Status: She is alert and oriented to person, place, and time.  Psychiatric:        Mood and Affect: Mood normal.        Behavior: Behavior normal.     Ortho Exam  Imaging: No results found.  Past Medical/Family/Surgical/Social History: Medications & Allergies reviewed per EMR, new medications updated. Patient Active Problem List   Diagnosis Date Noted   COVID-19 03/26/2022   Flank pain 02/04/2022   Chronic midline low back pain without sciatica 01/31/2022   Stress incontinence 09/04/2021   Laryngitis 08/30/2021   Right lower quadrant abdominal pain 08/23/2021   Pure hypercholesterolemia 08/21/2020   Morbid obesity ([place]) 11/11/2016   Moderate persistent asthma 06/27/2016   Anxiety state 12/19/2015   Essential hypertension 10/09/2015   CHF (congestive heart failure), NYHA class I, chronic, systolic ([place]) [id number]   GERD  (gastroesophageal reflux disease)    Past Medical History:  Diagnosis Date   Allergy-induced asthma    Allergy induced asthma   Anxiety    Bronchitis    Cancer ([place])    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 EF 40-45% in 2016, varying by several echoes. F/u echo 06/2015 showed EF 50%.    PONV (postoperative nausea and vomiting)    Sinus tachycardia    a. 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 b. 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   Family History  Problem Relation Age of Onset   Asthma Mother    Crohn's disease Mother    Colon polyps Mother    Allergic rhinitis Mother    Heart disease Father        heart failure   Hyperlipidemia Father    Hemophilia Father    COPD Father        smoked   Urticaria Father    Allergic rhinitis Sister    Heart failure Brother    Allergic rhinitis Daughter    Hemophilia Son    Allergic rhinitis Son    Food Allergy Son    Heart failure Paternal Aunt    Heart failure Paternal Uncle    COPD Maternal Grandmother        smoked   Lung cancer Maternal Grandmother        smoked   Cancer Maternal Grandfather        skin   Heart attack Maternal Grandfather    Diabetes Paternal Grandfather    Stroke Paternal Grandfather    Asthma Niece    Hypertension Neg Hx    Eczema Neg Hx    Angioedema Neg Hx    Atopy Neg Hx    Immunodeficiency Neg Hx    Past Surgical History:  Procedure Laterality Date   ABDOMINAL HYSTERECTOMY     CESAREAN SECTION  03-01-2008  and 2016   IM PINNING RIGHT ELBOW FX  1992   HARDWARE REMOVED   LAPAROSCOPIC ASSISTED VAGINAL HYSTERECTOMY Left 02/04/2017   Procedure:  LAPAROSCOPIC ASSISTED VAGINAL HYSTERECTOMY, LEFT SALPINGOOPHORECTOMY;  Surgeon: Dian Queen, MD;  Location: [place];  Service: Gynecology;  Laterality: Left;  NEED BED   LAPAROSCOPY N/A 07/29/2014   Procedure: LAPAROSCOPY DIAGNOSTIC;  Surgeon: Cyril Mourning, MD;  [facility];  Service: Gynecology;  Laterality: N/A;   OOPHORECTOMY Left 02/04/2017   Procedure: LEFT OOPHORECTOMY;  Surgeon: Dian Queen, MD;  Location: [facility];  Service: Gynecology;  Laterality: Left;   Social History   Occupational History   Occupation: CNA   Tobacco Use   Smoking status: Former    Packs/day: 0.50    Years: 10.00    Total pack years: 5.00    Types: Cigarettes   Smokeless tobacco: Never  Vaping Use   Vaping Use: Some days  Substance and Sexual Activity   Alcohol use: Not Currently   Drug use: No   Sexual activity: Yes    Birth control/protection: Surgical

## 2022-08-21 NOTE — Progress Notes (Signed)
Mostly having pain in her lower back all the way across, goes into both hips and into the right leg to the knee. Onset x years for back pain, into the leg for about a month now and getting worse. Has had 2 injections with very little relief,  Harder to get ADL's done due to pain, hard to stand for just 10 minutes at a time, use to doing 16 hours. This is becoming mentally challenging for her.

## 2022-08-24 DIAGNOSIS — X500XXA Overexertion from strenuous movement or load, initial encounter: Secondary | ICD-10-CM | POA: Diagnosis not present

## 2022-08-24 DIAGNOSIS — S93402A Sprain of unspecified ligament of left ankle, initial encounter: Secondary | ICD-10-CM | POA: Diagnosis not present

## 2022-08-24 DIAGNOSIS — Y999 Unspecified external cause status: Secondary | ICD-10-CM | POA: Diagnosis not present

## 2022-08-24 DIAGNOSIS — M25572 Pain in left ankle and joints of left foot: Secondary | ICD-10-CM | POA: Diagnosis not present

## 2022-09-05 ENCOUNTER — Encounter: Payer: Self-pay | Admitting: Nurse Practitioner

## 2022-09-05 ENCOUNTER — Other Ambulatory Visit: Payer: Self-pay | Admitting: Nurse Practitioner

## 2022-09-05 ENCOUNTER — Ambulatory Visit (INDEPENDENT_AMBULATORY_CARE_PROVIDER_SITE_OTHER): Payer: BC Managed Care – PPO | Admitting: Nurse Practitioner

## 2022-09-05 VITALS — BP 128/88 | HR 92 | Temp 98.3°F | Ht 66.75 in | Wt 297.0 lb

## 2022-09-05 DIAGNOSIS — F411 Generalized anxiety disorder: Secondary | ICD-10-CM

## 2022-09-05 DIAGNOSIS — G8929 Other chronic pain: Secondary | ICD-10-CM

## 2022-09-05 DIAGNOSIS — Z0000 Encounter for general adult medical examination without abnormal findings: Secondary | ICD-10-CM

## 2022-09-05 DIAGNOSIS — E782 Mixed hyperlipidemia: Secondary | ICD-10-CM | POA: Diagnosis not present

## 2022-09-05 DIAGNOSIS — I10 Essential (primary) hypertension: Secondary | ICD-10-CM

## 2022-09-05 DIAGNOSIS — J302 Other seasonal allergic rhinitis: Secondary | ICD-10-CM

## 2022-09-05 DIAGNOSIS — K219 Gastro-esophageal reflux disease without esophagitis: Secondary | ICD-10-CM

## 2022-09-05 DIAGNOSIS — M545 Low back pain, unspecified: Secondary | ICD-10-CM | POA: Diagnosis not present

## 2022-09-05 DIAGNOSIS — I5022 Chronic systolic (congestive) heart failure: Secondary | ICD-10-CM

## 2022-09-05 LAB — CBC WITH DIFFERENTIAL/PLATELET
Basophils Absolute: 0 10*3/uL (ref 0.0–0.1)
Basophils Relative: 0.7 % (ref 0.0–3.0)
Eosinophils Absolute: 0.2 10*3/uL (ref 0.0–0.7)
Eosinophils Relative: 2.3 % (ref 0.0–5.0)
HCT: 39 % (ref 36.0–46.0)
Hemoglobin: 13.3 g/dL (ref 12.0–15.0)
Lymphocytes Relative: 29.2 % (ref 12.0–46.0)
Lymphs Abs: 2.1 10*3/uL (ref 0.7–4.0)
MCHC: 34 g/dL (ref 30.0–36.0)
MCV: 90.6 fl (ref 78.0–100.0)
Monocytes Absolute: 0.4 10*3/uL (ref 0.1–1.0)
Monocytes Relative: 5.3 % (ref 3.0–12.0)
Neutro Abs: 4.5 10*3/uL (ref 1.4–7.7)
Neutrophils Relative %: 62.5 % (ref 43.0–77.0)
Platelets: 221 10*3/uL (ref 150.0–400.0)
RBC: 4.31 Mil/uL (ref 3.87–5.11)
RDW: 12.9 % (ref 11.5–15.5)
WBC: 7.1 10*3/uL (ref 4.0–10.5)

## 2022-09-05 LAB — COMPREHENSIVE METABOLIC PANEL
ALT: 19 U/L (ref 0–35)
AST: 14 U/L (ref 0–37)
Albumin: 4 g/dL (ref 3.5–5.2)
Alkaline Phosphatase: 64 U/L (ref 39–117)
BUN: 7 mg/dL (ref 6–23)
CO2: 27 mEq/L (ref 19–32)
Calcium: 9.1 mg/dL (ref 8.4–10.5)
Chloride: 106 mEq/L (ref 96–112)
Creatinine, Ser: 0.63 mg/dL (ref 0.40–1.20)
GFR: 114.06 mL/min (ref >60.00)
Glucose, Bld: 88 mg/dL (ref 70–99)
Potassium: 4.1 mEq/L (ref 3.5–5.1)
Sodium: 139 mEq/L (ref 135–145)
Total Bilirubin: 0.5 mg/dL (ref 0.2–1.2)
Total Protein: 6.4 g/dL (ref 6.0–8.3)

## 2022-09-05 LAB — HEMOGLOBIN A1C: Hgb A1c MFr Bld: 5.1 % (ref 4.6–6.5)

## 2022-09-05 LAB — TSH: TSH: 0.95 u[IU]/mL (ref 0.35–5.50)

## 2022-09-05 LAB — LIPID PANEL
Cholesterol: 190 mg/dL (ref 0–200)
HDL: 35.8 mg/dL — ABNORMAL LOW (ref >39.00)
LDL Cholesterol: 122 mg/dL — ABNORMAL HIGH (ref 0–99)
NonHDL: 154.54
Total CHOL/HDL Ratio: 5
Triglycerides: 161 mg/dL — ABNORMAL HIGH (ref 0.0–149.0)
VLDL: 32.2 mg/dL (ref 0.0–40.0)

## 2022-09-05 MED ORDER — PANTOPRAZOLE SODIUM 40 MG PO TBEC: 40.0000 mg | DELAYED_RELEASE_TABLET | Freq: Every day | ORAL | 3 refills | Status: DC

## 2022-09-05 MED ORDER — BUDESONIDE-FORMOTEROL FUMARATE 160-4.5 MCG/ACT IN AERO: 2.0000 | INHALATION_SPRAY | Freq: Two times a day (BID) | RESPIRATORY_TRACT | 3 refills | Status: DC

## 2022-09-05 MED ORDER — ZEPBOUND 2.5 MG/0.5ML ~~LOC~~ SOAJ: 2.5000 mg | SUBCUTANEOUS | 1 refills | Status: DC

## 2022-09-05 NOTE — Assessment & Plan Note (Signed)
Occurred during her pregnancy. Last EF was 50-55% on 01/12/18. She is euvolemic on exam. She limits the amount of fluids she drinks during the day. She is currently not on any medications. Follow up with any concerns.

## 2022-09-05 NOTE — Assessment & Plan Note (Signed)
Chronic, stable. Symptoms well controlled on protonix daily and avoiding triggers. Continue protonix 40 mg daily, refill sent to the pharmacy.

## 2022-09-05 NOTE — Assessment & Plan Note (Signed)
She has severe allergies which lead to asthma symptoms.  Continue Symbicort inhaler daily, refill sent to the pharmacy along with albuterol as needed.

## 2022-09-05 NOTE — Assessment & Plan Note (Signed)
Chronic, stable.  BP today 128/88.  This is currently controlled without medication.

## 2022-09-05 NOTE — Assessment & Plan Note (Signed)
Chronic, stable.  She is still following with her psychiatrist and is taking duloxetine 30 mg daily and Ativan 1 mg every 6 hours as needed.  Continue collaboration's recommendation from specialist.

## 2022-09-05 NOTE — Patient Instructions (Addendum)
It was great to see you!  We are checking your labs today and will let you know the results via mychart/phone.   Start zepbound once a week injection to help with weight loss.   I have refilled your symbicort inhaler.   Let's follow-up in 6-8 weeks, sooner if you have concerns.  If a referral was placed today, you will be contacted for an appointment. Please note that routine referrals can sometimes take up to 3-4 weeks to process. Please call our office if you haven't heard anything after this time frame.  Take care,  Vance Peper, NP

## 2022-09-05 NOTE — Progress Notes (Addendum)
BP 128/88 (BP Location: Right Arm)   Pulse 92   Temp 98.3 F (36.8 C)   Ht 5' 6.75" (1.695 m)   Wt 297 lb (134.7 kg)   LMP 12/09/2016   SpO2 97%   BMI 46.87 kg/m    Subjective:    Patient ID: Michelle Kane, female    DOB: Nov 22, 1985, 37 y.o.   MRN: [id number]  CC: Chief Complaint  Patient presents with   Annual Exam    Concerns with hormone replacement    HPI: Michelle Kane is a 37 y.o. female presenting on 09/05/2022 for comprehensive medical examination. Current medical complaints include: weight gain  She has been gaining weight since her hysterectomy 5 years ago.  She has gained about 40 to 50 pounds since then.  Over the last 6 months, she has been doing a high-protein, low-carb diet and has only gained weight.  She is thinking that it is due to her hormones with her hysterectomy and left ovary removal.  She is unable to exercise due to her back pain and is hoping to get back surgery soon.  She currently lives with: husband, 3 kids, grandbaby Menopausal Symptoms: no  Depression and Anxiety Screen done today and results listed below:     09/05/2022   10:09 AM 09/04/2021    7:17 PM 08/30/2021   11:15 AM 08/21/2020   11:59 AM 10/14/2019    3:17 PM  Depression screen PHQ 2/9  Decreased Interest 2 0 0 0 0  Down, Depressed, Hopeless 0 0 0 0 0  PHQ - 2 Score 2 0 0 0 0  Altered sleeping 3      Tired, decreased energy 2      Change in appetite 1      Feeling bad or failure about yourself  0      Trouble concentrating 0      Moving slowly or fidgety/restless 0      Suicidal thoughts 0      PHQ-9 Score 8      Difficult doing work/chores Somewhat difficult          09/05/2022   10:10 AM 08/21/2020   11:59 AM  GAD 7 : Generalized Anxiety Score  Nervous, Anxious, on Edge 3 1  Control/stop worrying 1 1  Worry too much - different things 1 1  Trouble relaxing 3 1  Restless 2 1  Easily annoyed or irritable 3 1  Afraid - awful might happen 1 1  Total GAD 7 Score 14 7   Anxiety Difficulty Somewhat difficult Somewhat difficult    The patient has a history of falls. I did not complete a risk assessment for falls. A plan of care for falls was not documented.   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 EF 40-45% in 2016, varying by several echoes. F/u echo 06/2015 showed EF 50%.    PONV (postoperative  nausea and vomiting)    Sinus tachycardia    a. 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 b. 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    Surgical History:  Past Surgical History:  Procedure Laterality Date   ABDOMINAL HYSTERECTOMY     CESAREAN SECTION  03-01-2008  and 2016   IM PINNING RIGHT ELBOW FX  1992   HARDWARE REMOVED   LAPAROSCOPIC ASSISTED VAGINAL HYSTERECTOMY Left 02/04/2017   Procedure: LAPAROSCOPIC ASSISTED VAGINAL HYSTERECTOMY, LEFT SALPINGOOPHORECTOMY;  Surgeon: Dian Queen, MD;  Location: [facility];  Service: Gynecology;  Laterality: Left;  NEED BED   LAPAROSCOPY N/A 07/29/2014   Procedure: LAPAROSCOPY DIAGNOSTIC;  Surgeon: Cyril Mourning, MD;  Location: [facility]  Service: Gynecology;  Laterality: N/A;   OOPHORECTOMY Left 02/04/2017   Procedure: LEFT OOPHORECTOMY;  Surgeon: Dian Queen, MD;  Location: [facility];  Service: Gynecology;  Laterality: Left;    Medications:  Current Outpatient Medications on File Prior to Visit  Medication Sig   albuterol (VENTOLIN HFA) 108 (90  Base) MCG/ACT inhaler Inhale 2 puffs into the lungs every 4 (four) hours as needed for wheezing or shortness of breath.   azelastine (ASTELIN) 0.1 % nasal spray Place 1-2 sprays into both nostrils 2 (two) times daily as needed.   DULoxetine (CYMBALTA) 30 MG capsule Take 1 capsule (30 mg total) once a day by mouth for 2 weeks, then take 1 capsule (30 mg) twice a day.   fexofenadine (ALLEGRA) 60 MG tablet Take 60 mg by mouth 2 (two) times daily.   Fluticasone Propionate (XHANCE) 93 MCG/ACT EXHU 2 sprays per nostril twice a day if needed for stuffy nose.   levocetirizine (XYZAL) 5 MG tablet Take 5 mg by mouth in the morning and at bedtime.   LORazepam (ATIVAN) 1 MG tablet Take 1 mg by mouth every 6 (six) hours as needed for anxiety.   Semaglutide-Weight Management (WEGOVY) 0.25 MG/0.5ML SOAJ Inject 0.25 mg into the skin once a week. (Patient not taking: Reported on 07/16/2022)   No current facility-administered medications on file prior to visit.    Allergies:  Allergies  Allergen Reactions   Buspar [Buspirone] Other (See Comments)    Mouth, lips tingling, throat tightness, chest pain   Vicodin [Hydrocodone-Acetaminophen] Itching, Nausea And Vomiting and Other (See Comments)    GI pains also   Labetalol     Causes congestive heart failure   Zyrtec [Cetirizine]     Worsening anxiety    Social History:  Social History   Socioeconomic History   Marital status: Married    Spouse name: Not on file   Number of children: Not on file   Years of education: Not on file   Highest education level: Not on file  Occupational History   Occupation: CNA   Tobacco Use   Smoking status: Former    Packs/day: 0.50    Years: 10.00    Total pack years: 5.00    Types: Cigarettes   Smokeless tobacco: Never  Vaping Use   Vaping Use: Some days  Substance and Sexual Activity   Alcohol use: Not Currently   Drug use: No   Sexual activity: Yes    Birth control/protection: Surgical  Other Topics Concern    Not on file  Social History Narrative   Not on file   Social Determinants of Health   Financial Resource Strain: Not on file  Food Insecurity: Not on file  Transportation Needs: Not on file  Physical Activity: Not on file  Stress: Not on file  Social Connections: Not on file  Intimate Partner Violence: Not on file   Social History   Tobacco Use  Smoking Status Former   Packs/day: 0.50   Years: 10.00   Total pack years: 5.00   Types: Cigarettes  Smokeless Tobacco Never   Social History   Substance and Sexual Activity  Alcohol Use Not Currently    Family History:  Family History  Problem Relation Age of Onset   Asthma Mother    Crohn's disease Mother    Colon polyps Mother    Allergic rhinitis Mother    Heart disease Father        heart failure & Amyloidosis   Hyperlipidemia Father    Hemophilia Father    COPD Father        smoked   Urticaria Father    Allergic rhinitis Sister    Heart failure Brother    Allergic rhinitis Daughter    Hemophilia Son    Allergic rhinitis Son    Food Allergy Son    Heart failure Paternal Aunt    Heart failure Paternal Uncle    COPD Maternal Grandmother        smoked   Lung cancer Maternal Grandmother        smoked   Cancer Maternal Grandfather        skin   Heart attack Maternal Grandfather    Diabetes Paternal Grandfather    Stroke Paternal Grandfather    Asthma Niece    Hypertension Neg Hx    Eczema Neg Hx    Angioedema Neg Hx    Atopy Neg Hx    Immunodeficiency Neg Hx     Past medical history, surgical history, medications, allergies, family history and social history reviewed with patient today and changes made to appropriate areas of the chart.   Review of Systems  Constitutional:  Positive for malaise/fatigue. Negative for fever.  HENT: Negative.    Eyes: Negative.   Respiratory: Negative.    Cardiovascular: Negative.   Gastrointestinal: Negative.   Genitourinary: Negative.   Musculoskeletal:   Positive for back pain and joint pain (left ankle sprain).  Skin:  Positive for itching. Negative for rash.  Neurological: Negative.   Psychiatric/Behavioral:  Negative for depression. The patient is nervous/anxious.    All other ROS negative except what is listed above and in the HPI.      Objective:    BP 128/88 (BP Location: Right Arm)   Pulse 92   Temp 98.3 F (36.8 C)   Ht 5' 6.75" (1.695 m)   Wt 297 lb (134.7 kg)   LMP 12/09/2016   SpO2 97%   BMI 46.87 kg/m   Wt Readings from Last 3 Encounters:  09/05/22 297 lb (134.7 kg)  07/16/22 295 lb 3.2 oz (133.9 kg)  02/04/22 288 lb 12.8 oz (131 kg)    Physical Exam Vitals and nursing note reviewed.  Constitutional:      General: She is not in acute distress.    Appearance: Normal appearance.  HENT:     Head: Normocephalic and atraumatic.     Right Ear: Tympanic membrane, ear canal and external ear normal.     Left Ear: Tympanic membrane, ear canal and external ear normal.  Eyes:     Conjunctiva/sclera: Conjunctivae normal.  Cardiovascular:     Rate and Rhythm: Normal rate and regular rhythm.  Pulses: Normal pulses.     Heart sounds: Normal heart sounds.  Pulmonary:     Effort: Pulmonary effort is normal.     Breath sounds: Normal breath sounds.  Abdominal:     Palpations: Abdomen is soft.     Tenderness: There is no abdominal tenderness.  Musculoskeletal:        General: Normal range of motion.     Cervical back: Normal range of motion and neck supple.     Right lower leg: No edema.     Left lower leg: No edema.  Lymphadenopathy:     Cervical: No cervical adenopathy.  Skin:    General: Skin is warm and dry.  Neurological:     General: No focal deficit present.     Mental Status: She is alert and oriented to person, place, and time.     Cranial Nerves: No cranial nerve deficit.     Coordination: Coordination normal.     Gait: Gait normal.  Psychiatric:        Mood and Affect: Mood normal.        Behavior:  Behavior normal.        Thought Content: Thought content normal.        Judgment: Judgment normal.     Results for orders placed or performed in visit on 09/05/22  CBC with Differential/Platelet  Result Value Ref Range   WBC 7.1 4.0 - 10.5 K/uL   RBC 4.31 3.87 - 5.11 Mil/uL   Hemoglobin 13.3 12.0 - 15.0 g/dL   HCT 39.0 36.0 - 46.0 %   MCV 90.6 78.0 - 100.0 fl   MCHC 34.0 30.0 - 36.0 g/dL   RDW 12.9 11.5 - 15.5 %   Platelets 221.0 150.0 - 400.0 K/uL   Neutrophils Relative % 62.5 43.0 - 77.0 %   Lymphocytes Relative 29.2 12.0 - 46.0 %   Monocytes Relative 5.3 3.0 - 12.0 %   Eosinophils Relative 2.3 0.0 - 5.0 %   Basophils Relative 0.7 0.0 - 3.0 %   Neutro Abs 4.5 1.4 - 7.7 K/uL   Lymphs Abs 2.1 0.7 - 4.0 K/uL   Monocytes Absolute 0.4 0.1 - 1.0 K/uL   Eosinophils Absolute 0.2 0.0 - 0.7 K/uL   Basophils Absolute 0.0 0.0 - 0.1 K/uL  Comprehensive metabolic panel  Result Value Ref Range   Sodium 139 135 - 145 mEq/L   Potassium 4.1 3.5 - 5.1 mEq/L   Chloride 106 96 - 112 mEq/L   CO2 27 19 - 32 mEq/L   Glucose, Bld 88 70 - 99 mg/dL   BUN 7 6 - 23 mg/dL   Creatinine, Ser 0.63 0.40 - 1.20 mg/dL   Total Bilirubin 0.5 0.2 - 1.2 mg/dL   Alkaline Phosphatase 64 39 - 117 U/L   AST 14 0 - 37 U/L   ALT 19 0 - 35 U/L   Total Protein 6.4 6.0 - 8.3 g/dL   Albumin 4.0 3.5 - 5.2 g/dL   GFR 114.06 >60.00 mL/min   Calcium 9.1 8.4 - 10.5 mg/dL  Lipid panel  Result Value Ref Range   Cholesterol 190 0 - 200 mg/dL   Triglycerides 161.0 (H) 0.0 - 149.0 mg/dL   HDL 35.80 (L) >39.00 mg/dL   VLDL 32.2 0.0 - 40.0 mg/dL   LDL Cholesterol 122 (H) 0 - 99 mg/dL   Total CHOL/HDL Ratio 5    NonHDL 154.54   TSH  Result Value Ref Range   TSH 0.95 0.35 -  5.50 uIU/mL  Hemoglobin A1c  Result Value Ref Range   Hgb A1c MFr Bld 5.1 4.6 - 6.5 %      Assessment & Plan:   Problem List Items Addressed This Visit       Cardiovascular and Mediastinum   Essential hypertension    Chronic, stable.  BP  today 128/88.  This is currently controlled without medication.      CHF (congestive heart failure), NYHA class I, chronic, systolic ([place])    Occurred during her pregnancy. Last EF was 50-55% on 01/12/18. She is euvolemic on exam. She limits the amount of fluids she drinks during the day. She is currently not on any medications. Follow up with any concerns.         Digestive   GERD (gastroesophageal reflux disease)    Chronic, stable. Symptoms well controlled on protonix daily and avoiding triggers. Continue protonix 40 mg daily, refill sent to the pharmacy.      Relevant Medications   pantoprazole (PROTONIX) 40 MG tablet     Other   Anxiety state    Chronic, stable.  She is still following with her psychiatrist and is taking duloxetine 30 mg daily and Ativan 1 mg every 6 hours as needed.  Continue collaboration's recommendation from specialist.      Morbid obesity ([place])    BMI 46.8.  She has been following a high-protein, low-carb diet for the past 6 months and has gained 10 pounds.  She is unable to exercise due to her severe back pain and will be hopefully getting surgery on this soon.  She was approved for [facility], however it has been on backorder and she has not been able to get this.  Will have her start Zepbound 2.5 mg injection once a week.  Discussed possible side effects.  Will check A1c today.  Follow-up in 4 to 6 weeks after starting injection.      Relevant Medications   tirzepatide (ZEPBOUND) 2.5 MG/0.5ML Pen   Other Relevant Orders   Hemoglobin A1c (Completed)   Chronic midline low back pain without sciatica    She is having ongoing back pain and has done physical therapy, injections, and now she needs to have surgery on it since nothing is helping the pain.  She states that for insurance to cover the surgery, she needs to lose about 30 to 40 pounds and have her BMI be less than 40.  She is currently following with orthopedics.      Seasonal allergies    She has severe  allergies which lead to asthma symptoms.  Continue Symbicort inhaler daily, refill sent to the pharmacy along with albuterol as needed.      Mixed hyperlipidemia    Will check lipid panel today and treat based on results.      Relevant Orders   Lipid panel (Completed)   Other Visit Diagnoses     Routine general medical examination at a health care facility    -  Primary   Health maintenance reviewed and updated.  Discussed nutrition, exercise.  Check CMP, CBC, TSH.  Follow-up 1 year.   Relevant Orders   CBC with Differential/Platelet (Completed)   Comprehensive metabolic panel (Completed)   TSH (Completed)        Follow up plan: Return in about 3 months (around 12/04/2022) for weight management.   LABORATORY TESTING:  - Pap smear: not applicable  IMMUNIZATIONS:   - Tdap: Tetanus vaccination status reviewed: last tetanus booster within 10 years. -  Influenza: Refused - Pneumovax: Not applicable - Prevnar: Not applicable - HPV: Not applicable - Zostavax vaccine: Not applicable  SCREENING: -Mammogram: Not applicable  - Colonoscopy: Not applicable  - Bone Density: Not applicable  -Hearing Test: Not applicable  -Spirometry: Not applicable   PATIENT COUNSELING:   Advised to take 1 mg of folate supplement per day if capable of pregnancy.   Sexuality: Discussed sexually transmitted diseases, partner selection, use of condoms, avoidance of unintended pregnancy  and contraceptive alternatives.   Advised to avoid cigarette smoking.  I discussed with the patient that most people either abstain from alcohol or drink within safe limits (<=14/week and <=4 drinks/occasion for males, <=7/weeks and <= 3 drinks/occasion for females) and that the risk for alcohol disorders and other health effects rises proportionally with the number of drinks per week and how often a drinker exceeds daily limits.  Discussed cessation/primary prevention of drug use and availability of treatment for  abuse.   Diet: Encouraged to adjust caloric intake to maintain  or achieve ideal body weight, to reduce intake of dietary saturated fat and total fat, to limit sodium intake by avoiding high sodium foods and not adding table salt, and to maintain adequate dietary potassium and calcium preferably from fresh fruits, vegetables, and low-fat dairy products.    stressed the importance of regular exercise  Injury prevention: Discussed safety belts, safety helmets, smoke detector, smoking near bedding or upholstery.   Dental health: Discussed importance of regular tooth brushing, flossing, and dental visits.    NEXT PREVENTATIVE PHYSICAL DUE IN 1 YEAR. Return in about 3 months (around 12/04/2022) for weight management.

## 2022-09-05 NOTE — Assessment & Plan Note (Addendum)
BMI 46.8.  She has been following a high-protein, low-carb diet for the past 6 months and has gained 10 pounds.  She is unable to exercise due to her severe back pain and will be hopefully getting surgery on this soon.  She was approved for [facility], however it has been on backorder and she has not been able to get this.  Will have her start Zepbound 2.5 mg injection once a week.  Discussed possible side effects.  Will check A1c today.  Follow-up in 4 to 6 weeks after starting injection.

## 2022-09-05 NOTE — Assessment & Plan Note (Signed)
She is having ongoing back pain and has done physical therapy, injections, and now she needs to have surgery on it since nothing is helping the pain.  She states that for insurance to cover the surgery, she needs to lose about 30 to 40 pounds and have her BMI be less than 40.  She is currently following with orthopedics.

## 2022-09-05 NOTE — Assessment & Plan Note (Signed)
Will check lipid panel today and treat based on results.

## 2022-09-06 ENCOUNTER — Other Ambulatory Visit (HOSPITAL_COMMUNITY): Payer: Self-pay

## 2022-09-16 ENCOUNTER — Encounter: Payer: Self-pay | Admitting: Family Medicine

## 2022-09-16 ENCOUNTER — Ambulatory Visit (INDEPENDENT_AMBULATORY_CARE_PROVIDER_SITE_OTHER): Payer: BC Managed Care – PPO | Admitting: Family Medicine

## 2022-09-16 VITALS — BP 132/80 | HR 100 | Temp 98.7°F | Wt 290.2 lb

## 2022-09-16 DIAGNOSIS — J020 Streptococcal pharyngitis: Secondary | ICD-10-CM | POA: Diagnosis not present

## 2022-09-16 LAB — POCT RAPID STREP A (OFFICE): Rapid Strep A Screen: POSITIVE — AB

## 2022-09-16 MED ORDER — IBUPROFEN 600 MG PO TABS: 600.0000 mg | ORAL_TABLET | Freq: Three times a day (TID) | ORAL | 0 refills | Status: DC | PRN

## 2022-09-16 MED ORDER — AMOXICILLIN 500 MG PO CAPS: 500.0000 mg | ORAL_CAPSULE | Freq: Three times a day (TID) | ORAL | 0 refills | Status: AC

## 2022-09-16 NOTE — Progress Notes (Signed)
Assessment/Plan:   Problem List Items Addressed This Visit   None Visit Diagnoses     Sore throat    -  Primary   Relevant Orders   POCT rapid strep A      Plan:  Prescribe amoxicillin 500 mg three times a day for 10 days for potential bacterial infection. Advise ibuprofen 600 mg every 8 hours as needed for pain and inflammation. Recommend fluids, rest, and supportive care. Advise follow-up if symptoms worsen or do not improve within a few days or if new symptoms arise. Possible re-evaluation on Wednesday if symptoms persist.  There are no discontinued medications.    Subjective:  HPI: Encounter date: 09/16/2022  DENAIJA MARALDO is a 37 y.o. female who has GERD (gastroesophageal reflux disease); Essential hypertension; CHF (congestive heart failure), NYHA class I, chronic, systolic ([place]); Anxiety state; Morbid obesity ([place]); Right lower quadrant abdominal pain; Laryngitis; Stress incontinence; Chronic midline low back pain without sciatica; Flank pain; COVID-19; Seasonal allergies; and Mixed hyperlipidemia on their problem list..   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  HISTORY OF PRESENT ILLNESS:  Sore Throat. The patient reports difficulty speaking due to throat pain for two days. No fevers or chills noted. The patient has not used over-the-counter medications or lozenges for relief. No known allergies to antibiotics. Advised to maintain hydration and rest.  Myalgia. The patient also complains of  myalgias concurrent with the sore throat. Has not taken any analgesics prior to the visit.    ROS:  General: No fever or chills. ENT: Sore throat reported. No nasal discharge. Musculoskeletal: Myalgia present. Remainder of ROS negative.  Past Surgical History:  Procedure Laterality Date   ABDOMINAL HYSTERECTOMY     CESAREAN SECTION  03-01-2008  and 2016   IM PINNING RIGHT ELBOW FX  1992   HARDWARE REMOVED   LAPAROSCOPIC ASSISTED VAGINAL HYSTERECTOMY Left 02/04/2017   Procedure: LAPAROSCOPIC ASSISTED VAGINAL HYSTERECTOMY, LEFT SALPINGOOPHORECTOMY;  Surgeon: Dian Queen, MD;  Location: [facility];  Service: Gynecology;  Laterality: Left;  NEED BED   LAPAROSCOPY N/A 07/29/2014   Procedure: LAPAROSCOPY DIAGNOSTIC;  Surgeon: Cyril Mourning, MD;  Location: [facility];  Service: Gynecology;  Laterality: N/A;   OOPHORECTOMY Left 02/04/2017   Procedure: LEFT OOPHORECTOMY;  Surgeon: Dian Queen, MD;  Location: [facility];  Service: Gynecology;  Laterality: Left;    Outpatient Medications Prior to Visit  Medication Sig Dispense Refill   albuterol (VENTOLIN HFA) 108 (90 Base) MCG/ACT inhaler Inhale 2 puffs into the lungs every 4 (four) hours as needed for wheezing or shortness of breath. 18 g 1   azelastine (ASTELIN) 0.1 % nasal spray Place 1-2 sprays into both nostrils 2 (two) times daily as needed. 30 mL 5   budesonide-formoterol (SYMBICORT) 160-4.5 MCG/ACT inhaler Inhale 2 puffs into the lungs 2 (two) times daily. 1 each 3   DULoxetine (CYMBALTA) 30 MG capsule Take 1 capsule (30 mg total) once a day by mouth for 2 weeks, then take 1 capsule (30 mg) twice a day. 60 capsule 0   fexofenadine (ALLEGRA) 60 MG tablet Take 60  mg by mouth 2 (two) times daily.     Fluticasone Propionate (XHANCE) 93 MCG/ACT EXHU 2 sprays per nostril twice a day if needed for stuffy nose. 32 mL 5   levocetirizine (XYZAL) 5 MG tablet Take 5 mg by mouth in the  morning and at bedtime.     LORazepam (ATIVAN) 1 MG tablet Take 1 mg by mouth every 6 (six) hours as needed for anxiety.     pantoprazole (PROTONIX) 40 MG tablet Take 1 tablet (40 mg total) by mouth daily. Take 30-60 min before first meal of the day 90 tablet 3   Semaglutide-Weight Management (WEGOVY) 0.25 MG/0.5ML SOAJ Inject 0.25 mg into the skin once a week. 2 mL 0   tirzepatide (ZEPBOUND) 2.5 MG/0.5ML Pen Inject 2.5 mg into the skin once a week. 2 mL 1   No facility-administered medications prior to visit.    Family History  Problem Relation Age of Onset   Asthma Mother    Crohn's disease Mother    Colon polyps Mother    Allergic rhinitis Mother    Heart disease Father        heart failure & Amyloidosis   Hyperlipidemia Father    Hemophilia Father    COPD Father        smoked   Urticaria Father    Allergic rhinitis Sister    Heart failure Brother    Allergic rhinitis Daughter    Hemophilia Son    Allergic rhinitis Son    Food Allergy Son    Heart failure Paternal Aunt    Heart failure Paternal Uncle    COPD Maternal Grandmother        smoked   Lung cancer Maternal Grandmother        smoked   Cancer Maternal Grandfather        skin   Heart attack Maternal Grandfather    Diabetes Paternal Grandfather    Stroke Paternal Grandfather    Asthma Niece    Hypertension Neg Hx    Eczema Neg Hx    Angioedema Neg Hx    Atopy Neg Hx    Immunodeficiency Neg Hx     Social History   Socioeconomic History   Marital status: Married    Spouse name: Not on file   Number of children: Not on file   Years of education: Not on file   Highest education level: Not on file  Occupational History   Occupation: CNA   Tobacco Use   Smoking status: Former    Packs/day: 0.50    Years: 10.00    Total pack years: 5.00    Types: Cigarettes   Smokeless tobacco: Never  Vaping Use   Vaping Use: Some days  Substance and Sexual Activity   Alcohol use: Not Currently   Drug use: No    Sexual activity: Yes    Birth control/protection: Surgical  Other Topics Concern   Not on file  Social History Narrative   Not on file   Social Determinants of Health   Financial Resource Strain: Not on file  Food Insecurity: Not on file  Transportation Needs: Not on file  Physical Activity: Not on file  Stress: Not on file  Social Connections: Not on file  Intimate Partner Violence: Not on file  Objective:  Physical Exam: BP 132/80 (BP Location: Left Arm, Patient Position: Sitting, Cuff Size: Large)   Pulse 100   Temp 98.7 F (37.1 C) (Oral)   Wt 290 lb 3.2 oz (131.6 kg)   LMP 12/09/2016   SpO2 98%   BMI 45.79 kg/m    General: No acute distress. Awake and conversant.  Eyes: Normal conjunctiva, anicteric. Round symmetric pupils.  ENT: Hearing grossly intact. No nasal discharge.  Neck: Neck is supple. No masses or thyromegaly.  Respiratory: Respirations are non-labored. No auditory wheezing.  Skin: Warm. No rashes or ulcers.  Psych: Alert and oriented. Cooperative, Appropriate mood and affect, Normal judgment.  CV: No cyanosis or JVD MSK: Normal ambulation. No clubbing  Neuro: Sensation and CN II-XII grossly normal.   Physical Exam Constitutional:      General: She is not in acute distress.    Appearance: Normal appearance. She is not ill-appearing or toxic-appearing.  HENT:     Head: Normocephalic and atraumatic.     Right Ear: Hearing, tympanic membrane, ear canal and external ear normal. There is no impacted cerumen.     Left Ear: Hearing, tympanic membrane, ear canal and external ear normal. There is no impacted cerumen.     Nose: Nose normal. No congestion.     Mouth/Throat:     Mouth: Mucous membranes are moist.     Pharynx: Oropharynx is clear. No oropharyngeal exudate.     Tonsils: Tonsillar exudate present. 2+ on the right. 2+ on the left.  Eyes:     General:  No scleral icterus.       Right eye: No discharge.        Left eye: No discharge.     Extraocular Movements: Extraocular movements intact.     Conjunctiva/sclera: Conjunctivae normal.     Pupils: Pupils are equal, round, and reactive to light.  Cardiovascular:     Rate and Rhythm: Normal rate and regular rhythm.     Pulses: Normal pulses.     Heart sounds: Normal heart sounds.     Comments: No cyanosis, no JVD Pulmonary:     Effort: Pulmonary effort is normal. No respiratory distress.     Breath sounds: Normal breath sounds.     Comments: No auditory wheezing Abdominal:     General: Abdomen is flat. Bowel sounds are normal.     Palpations: Abdomen is soft.  Musculoskeletal:        General: Normal range of motion.     Cervical back: Normal range of motion.     Comments: Normal Ambulation. No clubbing  Lymphadenopathy:     Cervical: No cervical adenopathy.  Skin:    General: Skin is warm and dry.     Findings: No rash.  Neurological:     General: No focal deficit present.     Mental Status: She is alert and oriented to person, place, and time. Mental status is at baseline.     Cranial Nerves: No cranial nerve deficit.  Psychiatric:        Mood and Affect: Mood normal.        Behavior: Behavior normal.        Thought Content: Thought content normal.        Judgment: Judgment normal.    Results for orders placed or performed in visit on 09/16/22  POCT rapid strep A  Result Value Ref Range   Rapid Strep A Screen Positive (A) Negative  Alesia Banda, MD, MS

## 2022-09-16 NOTE — Patient Instructions (Signed)
Take amoxicillin and ibuprofen as prescribed

## 2022-09-19 ENCOUNTER — Ambulatory Visit (INDEPENDENT_AMBULATORY_CARE_PROVIDER_SITE_OTHER): Payer: BC Managed Care – PPO

## 2022-09-19 ENCOUNTER — Encounter: Payer: Self-pay | Admitting: Orthopedic Surgery

## 2022-09-19 ENCOUNTER — Ambulatory Visit (INDEPENDENT_AMBULATORY_CARE_PROVIDER_SITE_OTHER): Payer: BC Managed Care – PPO | Admitting: Orthopedic Surgery

## 2022-09-19 VITALS — BP 130/81 | HR 84 | Ht 66.75 in | Wt 290.5 lb

## 2022-09-19 DIAGNOSIS — M5116 Intervertebral disc disorders with radiculopathy, lumbar region: Secondary | ICD-10-CM | POA: Diagnosis not present

## 2022-09-19 DIAGNOSIS — M5416 Radiculopathy, lumbar region: Secondary | ICD-10-CM

## 2022-09-19 DIAGNOSIS — M4726 Other spondylosis with radiculopathy, lumbar region: Secondary | ICD-10-CM | POA: Diagnosis not present

## 2022-09-19 NOTE — Progress Notes (Signed)
Orthopedic Spine Surgery Office Note  Assessment: Patient is a 37 y.o. female with low back pain. 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    Plan: -Explained that initially conservative treatment is tried as a significant number of patients may experience relief with these treatment modalities. Discussed that the conservative treatments include:  -activity modification  -physical therapy  -over the counter pain medications  -medrol dosepak  -lumbar steroid injections -Patient has tried PT, tylenol, oral steroids, steroid injections -Recommended weight loss, core strengthening, tylenol. Offered pain management as an additional option but she was not interested as she does not want to be on narcotics and has side effects -Currently her BMI is too high to be a good surgical candidate. Even if she did lose weight, I am not confident that she would benefit from surgery because her main issue is back pain. Her radicular pain is tolerable.  -Patient was frustrated because she was under the impression that Dr. Lorin Mercy was going to offer her surgery if she lost weight. I explained that if you put the same imaging and clinical picture to a group of spine surgeons, you may get different opinions. I told her if she loses the weight, she should touch base with Dr. Lorin Mercy again  Patient expressed understanding of the plan and all questions were answered to the patient's satisfaction.   ___________________________________________________________________________   History:  Patient is a 37 y.o. female who presents today for lumbar spine. Patient has had low back pain for 2.5-3 years. There was not trauma or injury that brought on the pain. She sometimes has bilateral hip and pain radiating down the back of bilateral legs. The leg pain is not constant and is not nearly as severe as the back pain. Her main pain is the back pain. She has tried multiple conservative treatments without any relief. She does not get any  relief with injections.   Weakness: denies Symptoms of imbalance: denies Paresthesias and numbness: denies Bowel or bladder incontinence: denies Saddle anesthesia: denies  Treatments tried: PT, tylenol, oral steroids, steroid injections   Review of systems: Denies fevers and chills, night sweats, unexplained weight loss, history of cancer, pain that wakes them at night  Past medical history: Anxiety Morbid obesity GERD  Allergies: buspar, vicodin, labetalol, zyrtec  Past surgical history:  C section (x2) Hysterectomy Right arm ORIF  Social history: Denies use of nicotine product (smoking, vaping, patches, smokeless) Alcohol use: denies Denies recreational drug use   Physical Exam:  General: no acute distress, appears stated age Neurologic: alert, answering questions appropriately, following commands Respiratory: unlabored breathing on room air, symmetric chest rise Psychiatric: appropriate affect, normal cadence to speech   MSK (spine):  -Strength exam      Left  Right EHL    5/5  5/5 TA    5/5  5/5 GSC    5/5  5/5 Knee extension  5/5  5/5 Hip flexion   5/5  5/5  -Sensory exam    Sensation intact to light touch in L3-S1 nerve distributions of bilateral lower extremities  -Achilles DTR: 1/4 on the left, 1/4 on the right -Patellar tendon DTR: 1/4 on the left, 1/4 on the right  -Straight leg raise: negative bilaterally  -Femoral nerve stretch test: negative bilaterally -Clonus: no beats bilaterally  -Left hip exam: no pain through range of motion, negative FABER, negative SI joint compression test, negative stinchfield  -Right hip exam: no pain through range of motion, negative FABER, negative SI joint compression test, negative  stinchfield   Imaging: XR of the lumbar spine from 09/19/2022 was independently reviewed and interpreted, showing disc height loss at L5/S1. No fracture or dislocation. No evidence of instability on flexion/extension.   MRI of  the lumbar spine from 11/22/2021 was independently reviewed and interpreted, showing disc desiccation at L4/5. DDD at L5/S1 with central disc herniation.    Patient name: Michelle Kane Patient MRN: [id number] Date of visit: 09/19/22

## 2022-09-27 ENCOUNTER — Other Ambulatory Visit: Payer: Self-pay | Admitting: Nurse Practitioner

## 2022-09-30 ENCOUNTER — Other Ambulatory Visit: Payer: Self-pay | Admitting: Nurse Practitioner

## 2022-09-30 MED ORDER — WEGOVY 0.25 MG/0.5ML ~~LOC~~ SOAJ: 0.2500 mg | SUBCUTANEOUS | 0 refills | Status: DC

## 2022-09-30 NOTE — Telephone Encounter (Signed)
I called pharmacy due to Rx refill request of Symbicort received. Last refill was on 09/06/22 with 3 refills. Per pharmacist patient still has 3 refills and asked if patient needed for this to be refilled and I told him yes.

## 2022-10-01 ENCOUNTER — Ambulatory Visit: Payer: BC Managed Care – PPO | Admitting: Orthopaedic Surgery

## 2022-10-01 ENCOUNTER — Other Ambulatory Visit: Payer: Self-pay | Admitting: Nurse Practitioner

## 2022-10-01 MED ORDER — FLUTICASONE FUROATE-VILANTEROL 100-25 MCG/ACT IN AEPB: 1.0000 | INHALATION_SPRAY | Freq: Every day | RESPIRATORY_TRACT | 11 refills | Status: DC

## 2022-10-02 ENCOUNTER — Telehealth: Payer: Self-pay | Admitting: Nurse Practitioner

## 2022-10-02 ENCOUNTER — Encounter: Payer: Self-pay | Admitting: Nurse Practitioner

## 2022-10-02 ENCOUNTER — Ambulatory Visit: Payer: BC Managed Care – PPO | Admitting: Nurse Practitioner

## 2022-10-02 NOTE — Telephone Encounter (Signed)
3.20.24 no show letter sent

## 2022-10-03 NOTE — Telephone Encounter (Signed)
Noted  

## 2022-10-03 NOTE — Telephone Encounter (Signed)
2nd no show, cannot charge fee due to Medicaid ins 2ndary, final warning sent via mail and [employer]

## 2022-10-04 ENCOUNTER — Other Ambulatory Visit (HOSPITAL_COMMUNITY): Payer: Self-pay

## 2022-10-09 ENCOUNTER — Telehealth: Payer: Self-pay

## 2022-10-09 ENCOUNTER — Other Ambulatory Visit (HOSPITAL_COMMUNITY): Payer: Self-pay

## 2022-10-09 NOTE — Telephone Encounter (Signed)
Pharmacy Patient Advocate Encounter   Received notification from [employer] that prior authorization for Wegovy 0.25MG /0.5ML auto-injectors is required/requested.  Per Test Claim: PA required   PA submitted on 10/09/22 to (ins) [employer] via CoverMyMeds Key or (Medicaid) confirmation # [id number] Status is pending

## 2022-10-10 ENCOUNTER — Other Ambulatory Visit (HOSPITAL_COMMUNITY): Payer: Self-pay

## 2022-10-10 NOTE — Telephone Encounter (Signed)
Patient Advocate Encounter  Prior Authorization for [employer] 0.25MG /0.5ML auto-injectors has been approved.    KeyKathleen Lime Effective dates: 10/09/22 through 05/11/23

## 2022-10-14 ENCOUNTER — Encounter: Payer: Self-pay | Admitting: Nurse Practitioner

## 2022-10-14 ENCOUNTER — Ambulatory Visit (INDEPENDENT_AMBULATORY_CARE_PROVIDER_SITE_OTHER): Payer: BC Managed Care – PPO | Admitting: Nurse Practitioner

## 2022-10-14 VITALS — BP 132/86 | HR 94 | Temp 98.6°F | Ht 66.75 in | Wt 294.2 lb

## 2022-10-14 DIAGNOSIS — R051 Acute cough: Secondary | ICD-10-CM | POA: Diagnosis not present

## 2022-10-14 DIAGNOSIS — J4540 Moderate persistent asthma, uncomplicated: Secondary | ICD-10-CM | POA: Diagnosis not present

## 2022-10-14 DIAGNOSIS — J40 Bronchitis, not specified as acute or chronic: Secondary | ICD-10-CM | POA: Diagnosis not present

## 2022-10-14 LAB — POCT INFLUENZA A/B
Influenza A, POC: NEGATIVE
Influenza B, POC: NEGATIVE

## 2022-10-14 LAB — POC COVID19 BINAXNOW: SARS Coronavirus 2 Ag: NEGATIVE

## 2022-10-14 MED ORDER — PREDNISONE 20 MG PO TABS: 20.0000 mg | ORAL_TABLET | Freq: Every day | ORAL | 0 refills | Status: DC

## 2022-10-14 NOTE — Patient Instructions (Signed)
It was great to see you!  Start prednisone 2 tablets daily in the morning with food.  Drink plenty of fluids, rest your voice.   I have placed a referral to asthma and allergy.   Let's follow-up if symptoms worsen or don't improve.   Take care,  Vance Peper, NP

## 2022-10-14 NOTE — Assessment & Plan Note (Signed)
Chronic, ongoing. She is having a flare-up of symptoms with possible pollen. Will have her start prednisone 40mg  x5 days. Continue using Breo inhaler and albuterol as needed. Referral placed to allergist.

## 2022-10-14 NOTE — Progress Notes (Signed)
Acute Office Visit  Subjective:     Patient ID: Michelle Kane, female    DOB: 05/08/1986, 37 y.o.   MRN: [id number]  Chief Complaint  Patient presents with   Fever    voice loss, cough for 2 days    HPI Patient is in today for fever, loss of voice, and cough for 2 days.   UPPER RESPIRATORY TRACT INFECTION  Fever: yes - low grade Cough: yes Shortness of breath: yes Wheezing: yes Chest pain: no Chest tightness: no Chest congestion: no Nasal congestion: no Runny nose: no Post nasal drip: no Sneezing: no Sore throat:  scratchy  Swollen glands: no Sinus pressure: no Headache: yes Face pain: no Toothache: no Ear pain: no bilateral Ear pressure: yes bilateral Eyes red/itching:no Eye drainage/crusting: no  Vomiting: no Rash: no Fatigue: yes Sick contacts: no Strep contacts: no  Context: worse Recurrent sinusitis: no Relief with OTC cold/cough medications: no  Treatments attempted: allegra, ibuprofen   ROS See pertinent positives and negatives per HPI.     Objective:    BP 132/86 (BP Location: Right Arm)   Pulse 94   Temp 98.6 F (37 C) Comment: Ibuprofen at 11am  Ht 5' 6.75" (1.695 m)   Wt 294 lb 3.2 oz (133.4 kg)   LMP 12/09/2016   SpO2 97%   BMI 46.42 kg/m  BP Readings from Last 3 Encounters:  10/14/22 132/86  09/19/22 130/81  09/16/22 132/80   Wt Readings from Last 3 Encounters:  10/14/22 294 lb 3.2 oz (133.4 kg)  09/19/22 290 lb 8 oz (131.8 kg)  09/16/22 290 lb 3.2 oz (131.6 kg)      Physical Exam Vitals and nursing note reviewed.  Constitutional:      General: She is not in acute distress.    Appearance: Normal appearance.  HENT:     Head: Normocephalic.     Right Ear: Tympanic membrane, ear canal and external ear normal.     Left Ear: Tympanic membrane, ear canal and external ear normal.     Nose:     Right Sinus: No maxillary sinus tenderness or frontal sinus tenderness.     Left Sinus: No maxillary sinus tenderness or frontal  sinus tenderness.  Eyes:     Conjunctiva/sclera: Conjunctivae normal.  Cardiovascular:     Rate and Rhythm: Normal rate and regular rhythm.     Pulses: Normal pulses.     Heart sounds: Normal heart sounds.  Pulmonary:     Effort: Pulmonary effort is normal.     Breath sounds: Normal breath sounds.  Musculoskeletal:     Cervical back: Normal range of motion. Tenderness present.  Lymphadenopathy:     Cervical: No cervical adenopathy.  Skin:    General: Skin is warm.  Neurological:     General: No focal deficit present.     Mental Status: She is alert and oriented to person, place, and time.  Psychiatric:        Mood and Affect: Mood normal.        Behavior: Behavior normal.        Thought Content: Thought content normal.        Judgment: Judgment normal.     Results for orders placed or performed in visit on 10/14/22  POC COVID-19 BinaxNow  Result Value Ref Range   SARS Coronavirus 2 Ag Negative Negative  POCT Influenza A/B  Result Value Ref Range   Influenza A, POC Negative Negative   Influenza B, POC  Negative Negative        Assessment & Plan:   Problem List Items Addressed This Visit       Respiratory   Moderate persistent asthma without complication    Chronic, ongoing. She is having a flare-up of symptoms with possible pollen. Will have her start prednisone 40mg  x5 days. Continue using Breo inhaler and albuterol as needed. Referral placed to allergist.       Relevant Medications   predniSONE (DELTASONE) 20 MG tablet   Other Relevant Orders   Ambulatory referral to Allergy   Other Visit Diagnoses     Bronchitis    -  Primary   Will treat with prednisone 40mg  daily x5 days. Encourage fluids, rest. Most likely related to allergies. Can continue OTC allergy meds   Relevant Orders   Ambulatory referral to Allergy   Acute cough       Covid and flu negative. Encourage fluids, see plan above for bronchitis   Relevant Orders   POC COVID-19 BinaxNow (Completed)    POCT Influenza A/B (Completed)       Meds ordered this encounter  Medications   predniSONE (DELTASONE) 20 MG tablet    Sig: Take 1 tablet (20 mg total) by mouth daily with breakfast.    Dispense:  10 tablet    Refill:  0    Return if symptoms worsen or fail to improve.  Charyl Dancer, NP

## 2022-10-22 ENCOUNTER — Encounter: Payer: Self-pay | Admitting: Nurse Practitioner

## 2022-11-01 ENCOUNTER — Encounter: Payer: Self-pay | Admitting: Nurse Practitioner

## 2022-11-01 ENCOUNTER — Ambulatory Visit (INDEPENDENT_AMBULATORY_CARE_PROVIDER_SITE_OTHER): Payer: BC Managed Care – PPO | Admitting: Nurse Practitioner

## 2022-11-01 VITALS — BP 118/80 | HR 92 | Temp 97.0°F | Ht 66.75 in | Wt 294.0 lb

## 2022-11-01 DIAGNOSIS — J302 Other seasonal allergic rhinitis: Secondary | ICD-10-CM | POA: Diagnosis not present

## 2022-11-01 MED ORDER — WEGOVY 0.5 MG/0.5ML ~~LOC~~ SOAJ: 0.5000 mg | SUBCUTANEOUS | 1 refills | Status: DC

## 2022-11-01 NOTE — Progress Notes (Signed)
   Established Patient Office Visit  Subjective   Patient ID: Michelle Kane, female    DOB: 07-30-1985  Age: 37 y.o. MRN: [id number]  Chief Complaint  Patient presents with   Weight Check    2 weeks since starting injections back, fever for two days post injection    HPI  Michelle Kane is here to follow-up on weight management.  She has been taking Wegovy for 2 months and is not having any side effects.  She states that on Monday and Tuesday, she had slight low-grade fevers intermittently.  She took Tylenol and they went away.  This happened to coincide after her injection on Sunday.  She has not been exercising as much recently, she has been sick with bronchitis.  She has been trying to watch what she is eating.    ROS See pertinent positives and negatives per HPI.    Objective:     BP 118/80 (BP Location: Right Arm)   Pulse 92   Temp (!) 97 F (36.1 C)   Ht 5' 6.75" (1.695 m)   Wt 294 lb (133.4 kg)   LMP 12/09/2016   SpO2 95%   BMI 46.39 kg/m  BP Readings from Last 3 Encounters:  11/01/22 118/80  10/14/22 132/86  09/19/22 130/81   Wt Readings from Last 3 Encounters:  11/01/22 294 lb (133.4 kg)  10/14/22 294 lb 3.2 oz (133.4 kg)  09/19/22 290 lb 8 oz (131.8 kg)      Physical Exam Vitals and nursing note reviewed.  Constitutional:      General: She is not in acute distress.    Appearance: Normal appearance.  HENT:     Head: Normocephalic.  Eyes:     Conjunctiva/sclera: Conjunctivae normal.  Cardiovascular:     Rate and Rhythm: Normal rate and regular rhythm.     Pulses: Normal pulses.     Heart sounds: Normal heart sounds.  Pulmonary:     Effort: Pulmonary effort is normal.     Breath sounds: Normal breath sounds.  Musculoskeletal:     Cervical back: Normal range of motion.  Skin:    General: Skin is warm.  Neurological:     General: No focal deficit present.     Mental Status: She is alert and oriented to person, place, and time.  Psychiatric:         Mood and Affect: Mood normal.        Behavior: Behavior normal.        Thought Content: Thought content normal.        Judgment: Judgment normal.      Assessment & Plan:   Problem List Items Addressed This Visit       Other   Morbid obesity    She is doing well on the [facility], will increase it to 0.5 mg injection weekly.  Continue with nutritional changes and increase exercise as able.  Follow-up in 3 months.      Relevant Medications   Semaglutide-Weight Management (WEGOVY) 0.5 MG/0.5ML SOAJ   Seasonal allergies - Primary    She has severe allergies which lead to asthma symptoms.  She is following with an allergist and is having an allergy test on Tuesday.       Return in about 3 months (around 01/31/2023) for weight management.    Gerre Scull, NP

## 2022-11-01 NOTE — Patient Instructions (Signed)
It was great to see you!  Increase your wegovy to 0.5mg  injection once a week.   Check your sugar and if less than 70, let me know.   Let's follow-up in 3 months, sooner if you have concerns.  If a referral was placed today, you will be contacted for an appointment. Please note that routine referrals can sometimes take up to 3-4 weeks to process. Please call our office if you haven't heard anything after this time frame.  Take care,  Rodman Pickle, NP

## 2022-11-01 NOTE — Assessment & Plan Note (Signed)
She has severe allergies which lead to asthma symptoms.  She is following with an allergist and is having an allergy test on Tuesday.

## 2022-11-01 NOTE — Assessment & Plan Note (Signed)
She is doing well on the Wegovy, will increase it to 0.5 mg injection weekly.  Continue with nutritional changes and increase exercise as able.  Follow-up in 3 months.

## 2022-11-04 NOTE — Progress Notes (Unsigned)
New Patient Note  RE: Michelle Kane MRN: [id number] DOB: Dec 08, 1985 Date of Office Visit: 11/05/2022  Consult requested by: Gerre Scull, NP Primary care provider: Gerre Scull, NP  Chief Complaint: No chief complaint on file.  History of Present Illness: I had the pleasure of seeing Michelle Kane for initial evaluation at the [facility] on 11/04/2022. She is a 37 y.o. female, who is referred here by Gerre Scull, NP for the evaluation of asthma.  She reports symptoms of *** chest tightness, shortness of breath, coughing, wheezing, nocturnal awakenings for *** years. Current medications include *** which help. She reports *** using aerochamber with inhalers. She tried the following inhalers: ***. Main triggers are ***allergies, infections, weather changes, smoke, exercise, pet exposure. In the last month, frequency of symptoms: ***x/week. Frequency of nocturnal symptoms: ***x/month. Frequency of SABA use: ***x/week. Interference with physical activity: ***. Sleep is ***disturbed. In the last 12 months, emergency room visits/urgent care visits/doctor office visits or hospitalizations due to respiratory issues: ***. In the last 12 months, oral steroids courses: ***. Lifetime history of hospitalization for respiratory issues: ***. Prior intubations: ***. Asthma was diagnosed at age *** by ***. History of pneumonia: ***. She was evaluated by allergist ***pulmonologist in the past. Smoking exposure: ***. Up to date with flu vaccine: ***. Up to date with pneumonia vaccine: ***. Up to date with COVID-19 vaccine: ***. Prior Covid-19 infection: ***. History of reflux: ***.  Assessment and Plan: Michelle Kane is a 37 y.o. female with: No problem-specific Assessment & Plan notes found for this encounter.  No follow-ups on file.  No orders of the defined types were placed in this encounter.  Lab Orders  No laboratory test(s) ordered today    Other allergy  screening: Asthma: {Blank single:19197::"yes","no"} Rhino conjunctivitis: {Blank single:19197::"yes","no"} Food allergy: {Blank single:19197::"yes","no"} Medication allergy: {Blank single:19197::"yes","no"} Hymenoptera allergy: {Blank single:19197::"yes","no"} Urticaria: {Blank single:19197::"yes","no"} Eczema:{Blank single:19197::"yes","no"} History of recurrent infections suggestive of immunodeficency: {Blank single:19197::"yes","no"}  Diagnostics: Spirometry:  Tracings reviewed. Her effort: {Blank single:19197::"Good reproducible efforts.","It was hard to get consistent efforts and there is a question as to whether this reflects a maximal maneuver.","Poor effort, data can not be interpreted."} FVC: ***L FEV1: ***L, ***% predicted FEV1/FVC ratio: ***% Interpretation: {Blank single:19197::"Spirometry consistent with mild obstructive disease","Spirometry consistent with moderate obstructive disease","Spirometry consistent with severe obstructive disease","Spirometry consistent with possible restrictive disease","Spirometry consistent with mixed obstructive and restrictive disease","Spirometry uninterpretable due to technique","Spirometry consistent with normal pattern","No overt abnormalities noted given today's efforts"}.  Please see scanned spirometry results for details.  Skin Testing: {Blank single:19197::"Select foods","Environmental allergy panel","Environmental allergy panel and select foods","Food allergy panel","None","Deferred due to recent antihistamines use"}. *** Results discussed with patient/family.   Past Medical History: Patient Active Problem List   Diagnosis Date Noted  . Seasonal allergies 09/05/2022  . Mixed hyperlipidemia 09/05/2022  . COVID-19 03/26/2022  . Flank pain 02/04/2022  . Chronic midline low back pain without sciatica 01/31/2022  . Stress incontinence 09/04/2021  . Laryngitis 08/30/2021  . Right lower quadrant abdominal pain 08/23/2021  . Morbid  obesity 11/11/2016  . Moderate persistent asthma without complication 06/27/2016  . Anxiety state 12/19/2015  . Essential hypertension 10/09/2015  . CHF (congestive heart failure), NYHA class I, chronic, systolic 10/09/2015  . GERD (gastroesophageal reflux disease)    Past Medical History:  Diagnosis Date  . Allergy-induced asthma    Allergy induced asthma  . Anxiety   . Bronchitis   . 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 Chronic systolic CHF (congestive heart failure)   . GERD (gastroesophageal reflux disease)   . Headache    migraines  . 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 History of colon polyps   . History of ovarian cyst   . Pelvic hematoma, female   . Pelvic pain in female   . Peripartum cardiomyopathy    a. EF 40-45% in 2016, varying by several echoes. F/u echo 06/2015 showed EF 50%.   Marland Kitchen PONV (postoperative nausea and vomiting)   . Sinus tachycardia    a. 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 b. 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   Past Surgical History: Past Surgical History:  Procedure Laterality Date  . ABDOMINAL HYSTERECTOMY    . CESAREAN SECTION  03-01-2008  and 2016  . IM PINNING RIGHT ELBOW FX  1992   HARDWARE REMOVED  . LAPAROSCOPIC ASSISTED VAGINAL HYSTERECTOMY Left 02/04/2017   Procedure: LAPAROSCOPIC ASSISTED VAGINAL HYSTERECTOMY, LEFT SALPINGOOPHORECTOMY;  Surgeon: Marcelle Overlie, MD;  Location: [facility];  Service: Gynecology;  Laterality: Left;  NEED BED  . LAPAROSCOPY N/A 07/29/2014   Procedure: LAPAROSCOPY DIAGNOSTIC;  Surgeon: Jeani Hawking, MD;  Location: [facility];  Service: Gynecology;  Laterality: N/A;  . OOPHORECTOMY Left 02/04/2017   Procedure: LEFT OOPHORECTOMY;  Surgeon: Marcelle Overlie, MD;  Location: [facility];  Service: Gynecology;  Laterality: Left;   Medication List:  Current Outpatient Medications   Medication Sig Dispense Refill  . albuterol (VENTOLIN HFA) 108 (90 Base) MCG/ACT inhaler Inhale 2 puffs into the lungs every 4 (four) hours as needed for wheezing or shortness of breath. 18 g 1  . azelastine (ASTELIN) 0.1 % nasal spray Place 1-2 sprays into both nostrils 2 (two) times daily as needed. 30 mL 5  . DULoxetine (CYMBALTA) 30 MG capsule Take 1 capsule (30 mg total) once a day by mouth for 2 weeks, then take 1 capsule (30 mg) twice a day. 60 capsule 0  . fexofenadine (ALLEGRA) 60 MG tablet Take 60 mg by mouth 2 (two) times daily.    . fluticasone furoate-vilanterol (BREO ELLIPTA) 100-25 MCG/ACT AEPB Inhale 1 puff into the lungs daily. 1 each 11  . Fluticasone Propionate (XHANCE) 93 MCG/ACT EXHU 2 sprays per nostril twice a day if needed for stuffy nose. 32 mL 5  . ibuprofen (ADVIL) 600 MG tablet Take 1 tablet (600 mg total) by mouth every 8 (eight) hours as needed. 30 tablet 0  . levocetirizine (XYZAL) 5 MG tablet Take 5 mg by mouth in the morning and at bedtime.    Marland Kitchen LORazepam (ATIVAN) 1 MG tablet Take 1 mg by mouth every 6 (six) hours as needed for anxiety.    . pantoprazole (PROTONIX) 40 MG tablet Take 1 tablet (40 mg total) by mouth daily. Take 30-60 min before first meal of the day 90 tablet 3  . predniSONE (DELTASONE) 20 MG tablet Take 1 tablet (20 mg total) by mouth daily with breakfast. 10 tablet 0  . Semaglutide-Weight Management (WEGOVY) 0.5 MG/0.5ML SOAJ Inject 0.5 mg into the skin once a week. 2 mL 1   No current facility-administered medications for this visit.   Allergies: Allergies  Allergen Reactions  . Buspar [Buspirone] Other (See Comments)    Mouth, lips tingling, throat tightness, chest pain  . Vicodin [Hydrocodone-Acetaminophen] Itching, Nausea And Vomiting and Other (See Comments)    GI pains also  .  Labetalol     Causes congestive heart failure  . Zyrtec [Cetirizine]     Worsening anxiety   Social History: Social History   Socioeconomic History  .  Marital status: Married    Spouse name: Not on file  . Number of children: Not on file  . Years of education: Not on file  . Highest education level: Not on file  Occupational History  . Occupation: CNA   Tobacco Use  . Smoking status: Former    Packs/day: 0.50    Years: 10.00    Additional pack years: 0.00    Total pack years: 5.00    Types: Cigarettes  . Smokeless tobacco: Never  Vaping Use  . Vaping Use: Some days  Substance and Sexual Activity  . Alcohol use: Not Currently  . Drug use: No  . Sexual activity: Yes    Birth control/protection: Surgical  Other Topics Concern  . Not on file  Social History Narrative  . Not on file   Social Determinants of Health   Financial Resource Strain: Not on file  Food Insecurity: Not on file  Transportation Needs: Not on file  Physical Activity: Not on file  Stress: Not on file  Social Connections: Not on file   Lives in a ***. Smoking: *** Occupation: ***  Environmental HistorySurveyor, minerals in the house: Copywriter, advertising in the family room: {Blank single:19197::"yes","no"} Carpet in the bedroom: {Blank single:19197::"yes","no"} Heating: {Blank single:19197::"electric","gas","heat pump"} Cooling: {Blank single:19197::"central","window","heat pump"} Pet: {Blank single:19197::"yes ***","no"}  Family History: Family History  Problem Relation Age of Onset  . Asthma Mother   . Crohn's disease Mother   . Colon polyps Mother   . Allergic rhinitis Mother   . Heart disease Father        heart failure & Amyloidosis  . Hyperlipidemia Father   . Hemophilia Father   . COPD Father        smoked  . Urticaria Father   . Allergic rhinitis Sister   . Heart failure Brother   . Allergic rhinitis Daughter   . Hemophilia Son   . Allergic rhinitis Son   . Food Allergy Son   . Heart failure Paternal Aunt   . Heart failure Paternal Uncle   . COPD Maternal Grandmother        smoked  . Lung cancer  Maternal Grandmother        smoked  . Cancer Maternal Grandfather        skin  . Heart attack Maternal Grandfather   . Diabetes Paternal Grandfather   . Stroke Paternal Grandfather   . Asthma Niece   . Hypertension Neg Hx   . Eczema Neg Hx   . Angioedema Neg Hx   . Atopy Neg Hx   . Immunodeficiency Neg Hx    Problem                               Relation Asthma                                   *** Eczema                                *** Food allergy                          ***  Allergic rhino conjunctivitis     ***  Review of Systems  Constitutional:  Negative for appetite change, chills, fever and unexpected weight change.  HENT:  Negative for congestion and rhinorrhea.   Eyes:  Negative for itching.  Respiratory:  Negative for cough, chest tightness, shortness of breath and wheezing.   Cardiovascular:  Negative for chest pain.  Gastrointestinal:  Negative for abdominal pain.  Genitourinary:  Negative for difficulty urinating.  Skin:  Negative for rash.  Neurological:  Negative for headaches.   Objective: LMP 12/09/2016  There is no height or weight on file to calculate BMI. Physical Exam Vitals and nursing note reviewed.  Constitutional:      Appearance: Normal appearance. She is well-developed.  HENT:     Head: Normocephalic and atraumatic.     Right Ear: Tympanic membrane and external ear normal.     Left Ear: Tympanic membrane and external ear normal.     Nose: Nose normal.     Mouth/Throat:     Mouth: Mucous membranes are moist.     Pharynx: Oropharynx is clear.  Eyes:     Conjunctiva/sclera: Conjunctivae normal.  Cardiovascular:     Rate and Rhythm: Normal rate and regular rhythm.     Heart sounds: Normal heart sounds. No murmur heard.    No friction rub. No gallop.  Pulmonary:     Effort: Pulmonary effort is normal.     Breath sounds: Normal breath sounds. No wheezing, rhonchi or rales.  Musculoskeletal:     Cervical back: Neck supple.  Skin:     General: Skin is warm.     Findings: No rash.  Neurological:     Mental Status: She is alert and oriented to person, place, and time.  Psychiatric:        Behavior: Behavior normal.  The plan was reviewed with the patient/family, and all questions/concerned were addressed.  It was my pleasure to see Michelle Kane today and participate in her care. Please feel free to contact me with any questions or concerns.  Sincerely,  Wyline Mood, DO Allergy & Immunology  [facility] office: [phone] [facility] office: [phone]

## 2022-11-05 ENCOUNTER — Other Ambulatory Visit: Payer: Self-pay

## 2022-11-05 ENCOUNTER — Ambulatory Visit (INDEPENDENT_AMBULATORY_CARE_PROVIDER_SITE_OTHER): Payer: BC Managed Care – PPO | Admitting: Allergy

## 2022-11-05 ENCOUNTER — Encounter: Payer: Self-pay | Admitting: Allergy

## 2022-11-05 VITALS — BP 122/77 | HR 100 | Temp 98.2°F | Resp 20 | Ht 67.0 in | Wt 297.0 lb

## 2022-11-05 DIAGNOSIS — K219 Gastro-esophageal reflux disease without esophagitis: Secondary | ICD-10-CM

## 2022-11-05 DIAGNOSIS — H1013 Acute atopic conjunctivitis, bilateral: Secondary | ICD-10-CM

## 2022-11-05 DIAGNOSIS — R21 Rash and other nonspecific skin eruption: Secondary | ICD-10-CM

## 2022-11-05 DIAGNOSIS — J3089 Other allergic rhinitis: Secondary | ICD-10-CM

## 2022-11-05 DIAGNOSIS — L299 Pruritus, unspecified: Secondary | ICD-10-CM

## 2022-11-05 DIAGNOSIS — L509 Urticaria, unspecified: Secondary | ICD-10-CM

## 2022-11-05 DIAGNOSIS — J4540 Moderate persistent asthma, uncomplicated: Secondary | ICD-10-CM

## 2022-11-05 DIAGNOSIS — L282 Other prurigo: Secondary | ICD-10-CM

## 2022-11-05 MED ORDER — ALBUTEROL SULFATE HFA 108 (90 BASE) MCG/ACT IN AERS: 2.0000 | INHALATION_SPRAY | RESPIRATORY_TRACT | 1 refills | Status: DC | PRN

## 2022-11-05 MED ORDER — EPINEPHRINE 0.3 MG/0.3ML IJ SOAJ: 0.3000 mg | INTRAMUSCULAR | 1 refills | Status: AC | PRN

## 2022-11-05 MED ORDER — MONTELUKAST SODIUM 10 MG PO TABS: 10.0000 mg | ORAL_TABLET | Freq: Every day | ORAL | 3 refills | Status: AC

## 2022-11-05 MED ORDER — FAMOTIDINE 20 MG PO TABS: 20.0000 mg | ORAL_TABLET | Freq: Two times a day (BID) | ORAL | 3 refills | Status: DC

## 2022-11-05 NOTE — Assessment & Plan Note (Signed)
Continue Protonix  once a day as before.

## 2022-11-05 NOTE — Assessment & Plan Note (Signed)
>>  ASSESSMENT AND PLAN FOR OTHER ALLERGIC RHINITIS WRITTEN ON 11/05/2022 12:09 PM BY Ellamae Sia, DO  Perennial rhino conjunctivitis symptoms x 6+ yrs which flares in the spring and fall. 2021 skin testing was positive to grass, trees, weed, ragweed, mold, dust mites, cat, dog, horse - patient had reaction after testing. No prior AIT.  Start environmental control measures as below. Use over the counter antihistamines such as Claritin (loratadine), Allegra (fexofenadine), or Xyzal (levocetirizine) daily as needed. May take twice a day during allergy flares. May switch antihistamines every few months. Start Singulair (montelukast) 10mg  daily at night as above.  Consider allergy injections for long term control if above medications do not help the symptoms - handout given. Get bloodwork first.  Start allergy injections - once I have the bloodwork results. Start on silver vial.  Had a detailed discussion with patient/family that clinical history is suggestive of allergic rhinitis, and may benefit from allergy immunotherapy (AIT). Discussed in detail regarding the dosing, schedule, side effects (mild to moderate local allergic reaction and rarely systemic allergic reactions including anaphylaxis), and benefits (significant improvement in nasal symptoms, seasonal flares of asthma) of immunotherapy with the patient. There is significant time commitment involved with allergy shots, which includes weekly immunotherapy injections for first 9-12 months and then biweekly to monthly injections for 3-5 years. Consent was signed. I have prescribed epinephrine injectable and demonstrated proper use. For mild symptoms you can take over the counter antihistamines such as Benadryl and monitor symptoms closely. If symptoms worsen or if you have severe symptoms including breathing issues, throat closure, significant swelling, whole body hives, severe diarrhea and vomiting, lightheadedness then inject epinephrine and seek  immediate medical care afterwards. Emergency action plan given.

## 2022-11-05 NOTE — Patient Instructions (Addendum)
Skin:  Continue with allegra once a day and Xyzal once a day. If symptoms are not controlled or causes drowsiness let us know. Start pepcid (famotidine) 20mg  twice a day.  If no improvement after 2 weeks: Start Singulair (montelukast) 10mg  daily at night. Cautioned that in some children/adults can experience behavioral changes including hyperactivity, agitation, depression, sleep disturbances and suicidal ideations. These side effects are rare, but if you notice them you should notify me and discontinue Singulair (montelukast). Avoid the following potential triggers: alcohol, tight clothing, NSAIDs, hot showers and getting overheated. See below for proper skin care.  Get bloodwork:  We are ordering labs, so please allow 1-2 weeks for the results to come back. With the newly implemented Cures Act, the labs might be visible to you at the same time that they become visible to me. However, I will not address the results until all of the results are back, so please be patient.   Asthma Daily controller medication(s):continue Breo 1 puff once a day and rinse mouth after each use.  May use albuterol rescue inhaler 2 puffs every 4 to 6 hours as needed for shortness of breath, chest tightness, coughing, and wheezing. Monitor frequency of use.  Breathing control goals:  Full participation in all desired activities (may need albuterol before activity) Albuterol use two times or less a week on average (not counting use with activity) Cough interfering with sleep two times or less a month Oral steroids no more than once a year No hospitalizations   Environmental allergies Start environmental control measures as below. Use over the counter antihistamines such as Claritin (loratadine), Allegra (fexofenadine), or Xyzal (levocetirizine) daily as needed. May take twice a day during allergy flares. May switch antihistamines every few months. Consider allergy injections for long term control if above  medications do not help the symptoms - handout given. Start allergy injections. Had a detailed discussion with patient/family that clinical history is suggestive of allergic rhinitis, and may benefit from allergy immunotherapy (AIT). Discussed in detail regarding the dosing, schedule, side effects (mild to moderate local allergic reaction and rarely systemic allergic reactions including anaphylaxis), and benefits (significant improvement in nasal symptoms, seasonal flares of asthma) of immunotherapy with the patient. There is significant time commitment involved with allergy shots, which includes weekly immunotherapy injections for first 9-12 months and then biweekly to monthly injections for 3-5 years. Consent was signed. I have prescribed epinephrine injectable and demonstrated proper use. For mild symptoms you can take over the counter antihistamines such as Benadryl and monitor symptoms closely. If symptoms worsen or if you have severe symptoms including breathing issues, throat closure, significant swelling, whole body hives, severe diarrhea and vomiting, lightheadedness then inject epinephrine and seek immediate medical care afterwards. Emergency action plan given.  Follow up in 2 months or sooner if needed.  Skin care recommendations  Bath time: Always use lukewarm water. AVOID very hot or cold water. Keep bathing time to 5-10 minutes. Do NOT use bubble bath. Use a mild soap and use just enough to wash the dirty areas. Do NOT scrub skin vigorously.  After bathing, pat dry your skin with a towel. Do NOT rub or scrub the skin.  Moisturizers and prescriptions:  ALWAYS apply moisturizers immediately after bathing (within 3 minutes). This helps to lock-in moisture. Use the moisturizer several times a day over the whole body. Good summer moisturizers include: Aveeno, CeraVe, Cetaphil. Good winter moisturizers include: Aquaphor, Vaseline, Cerave, Cetaphil, Eucerin, Vanicream. When using  moisturizers along with  medications, the moisturizer should be applied about one hour after applying the medication to prevent diluting effect of the medication or moisturize around where you applied the medications. When not using medications, the moisturizer can be continued twice daily as maintenance.  Laundry and clothing: Avoid laundry products with added color or perfumes. Use unscented hypo-allergenic laundry products such as Tide free, Cheer free & gentle, and All free and clear.  If the skin still seems dry or sensitive, you can try double-rinsing the clothes. Avoid tight or scratchy clothing such as wool. Do not use fabric softeners or dyer sheets.   Reducing Pollen Exposure Pollen seasons: trees (spring), grass (summer) and ragweed/weeds (fall). Keep windows closed in your home and car to lower pollen exposure.  Install air conditioning in the bedroom and throughout the house if possible.  Avoid going out in dry windy days - especially early morning. Pollen counts are highest between 5 - 10 AM and on dry, hot and windy days.  Save outside activities for late afternoon or after a heavy rain, when pollen levels are lower.  Avoid mowing of grass if you have grass pollen allergy. Be aware that pollen can also be transported indoors on people and pets.  Dry your clothes in an automatic dryer rather than hanging them outside where they might collect pollen.  Rinse hair and eyes before bedtime. Mold Control Mold and fungi can grow on a variety of surfaces provided certain temperature and moisture conditions exist.  Outdoor molds grow on plants, decaying vegetation and soil. The major outdoor mold, Alternaria and Cladosporium, are found in very high numbers during hot and dry conditions. Generally, a late summer - fall peak is seen for common outdoor fungal spores. Rain will temporarily lower outdoor mold spore count, but counts rise rapidly when the rainy period ends. The most important  indoor molds are Aspergillus and Penicillium. Dark, humid and poorly ventilated basements are ideal sites for mold growth. The next most common sites of mold growth are the bathroom and the kitchen. Outdoor (Seasonal) Mold Control Use air conditioning and keep windows closed. Avoid exposure to decaying vegetation. Avoid leaf raking. Avoid grain handling. Consider wearing a face mask if working in moldy areas.  Indoor (Perennial) Mold Control  Maintain humidity below 50%. Get rid of mold growth on hard surfaces with water, detergent and, if necessary, 5% bleach (do not mix with other cleaners). Then dry the area completely. If mold covers an area more than 10 square feet, consider hiring an indoor environmental professional. For clothing, washing with soap and water is best. If moldy items cannot be cleaned and dried, throw them away. Remove sources e.g. contaminated carpets. Repair and seal leaking roofs or pipes. Using dehumidifiers in damp basements may be helpful, but empty the water and clean units regularly to prevent mildew from forming. All rooms, especially basements, bathrooms and kitchens, require ventilation and cleaning to deter mold and mildew growth. Avoid carpeting on concrete or damp floors, and storing items in damp areas. Control of House Dust Mite Allergen Dust mite allergens are a common trigger of allergy and asthma symptoms. While they can be found throughout the house, these microscopic creatures thrive in warm, humid environments such as bedding, upholstered furniture and carpeting. Because so much time is spent in the bedroom, it is essential to reduce mite levels there.  Encase pillows, mattresses, and box springs in special allergen-proof fabric covers or airtight, zippered plastic covers.  Bedding should be washed weekly in hot  water (130 F) and dried in a hot dryer. Allergen-proof covers are available for comforters and pillows that can't be regularly washed.  Wash the  allergy-proof covers every few months. Minimize clutter in the bedroom. Keep pets out of the bedroom.  Keep humidity less than 50% by using a dehumidifier or air conditioning. You can buy a humidity measuring device called a hygrometer to monitor this.  If possible, replace carpets with hardwood, linoleum, or washable area rugs. If that's not possible, vacuum frequently with a vacuum that has a HEPA filter. Remove all upholstered furniture and non-washable window drapes from the bedroom. Remove all non-washable stuffed toys from the bedroom.  Wash stuffed toys weekly. Pet Allergen Avoidance: Contrary to popular opinion, there are no "hypoallergenic" breeds of dogs or cats. That is because people are not allergic to an animal's hair, but to an allergen found in the animal's saliva, dander (dead skin flakes) or urine. Pet allergy symptoms typically occur within minutes. For some people, symptoms can build up and become most severe 8 to 12 hours after contact with the animal. People with severe allergies can experience reactions in public places if dander has been transported on the pet owners' clothing. Keeping an animal outdoors is only a partial solution, since homes with pets in the yard still have higher concentrations of animal allergens. Before getting a pet, ask your allergist to determine if you are allergic to animals. If your pet is already considered part of your family, try to minimize contact and keep the pet out of the bedroom and other rooms where you spend a great deal of time. As with dust mites, vacuum carpets often or replace carpet with a hardwood floor, tile or linoleum. High-efficiency particulate air (HEPA) cleaners can reduce allergen levels over time. While dander and saliva are the source of cat and dog allergens, urine is the source of allergens from rabbits, hamsters, mice and [place] pigs; so ask a non-allergic family member to clean the animal's cage. If you have a pet allergy,  talk to your allergist about the potential for allergy immunotherapy (allergy shots). This strategy can often provide long-term relief.

## 2022-11-05 NOTE — Assessment & Plan Note (Signed)
Perennial rhino conjunctivitis symptoms x 6+ yrs which flares in the spring and fall. 2021 skin testing was positive to grass, trees, weed, ragweed, mold, dust mites, cat, dog, horse - patient had reaction after testing. No prior AIT.  Start environmental control measures as below. Use over the counter antihistamines such as Claritin (loratadine), Allegra (fexofenadine), or Xyzal (levocetirizine) daily as needed. May take twice a day during allergy flares. May switch antihistamines every few months. Start Singulair (montelukast)  daily at night as above.  Consider allergy injections for long term control if above medications do not help the symptoms - handout given. Get bloodwork first.  Start allergy injections - once I have the bloodwork results. Start on silver vial.  Had a detailed discussion with patient/family that clinical history is suggestive of allergic rhinitis, and may benefit from allergy immunotherapy (AIT). Discussed in detail regarding the dosing, schedule, side effects (mild to moderate local allergic reaction and rarely systemic allergic reactions including anaphylaxis), and benefits (significant improvement in nasal symptoms, seasonal flares of asthma) of immunotherapy with the patient. There is significant time commitment involved with allergy shots, which includes weekly immunotherapy injections for first 9-12 months and then biweekly to monthly injections for 3-5 years. Consent was signed. I have prescribed epinephrine injectable and demonstrated proper use. For mild symptoms you can take over the counter antihistamines such as Benadryl and monitor symptoms closely. If symptoms worsen or if you have severe symptoms including breathing issues, throat closure, significant swelling, whole body hives, severe diarrhea and vomiting, lightheadedness then inject epinephrine and seek immediate medical care afterwards. Emergency action plan given.

## 2022-11-05 NOTE — Assessment & Plan Note (Signed)
Pruritic rash x 6 years which mainly occurs on her legs. Worse since off antihistamines. No specific triggers noted. 2024 CBC diff, CMP, TSH unremarkable. Can't take zyrtec.  Continue with allegra once a day and Xyzal once a day. If symptoms are not controlled or causes drowsiness let us know. Start Pepcid (famotidine)  twice a day.  If no improvement after 2 weeks: Start Singulair (montelukast)  daily at night. Cautioned that in some children/adults can experience behavioral changes including hyperactivity, agitation, depression, sleep disturbances and suicidal ideations. These side effects are rare, but if you notice them you should notify me and discontinue Singulair (montelukast). Avoid the following potential triggers: alcohol, tight clothing, NSAIDs, hot showers and getting overheated. See below for proper skin care.  Get bloodwork.

## 2022-11-05 NOTE — Assessment & Plan Note (Signed)
.   See assessment and plan as above. 

## 2022-11-05 NOTE — Assessment & Plan Note (Addendum)
Diagnosed with asthma 20+ years ago. Recently switched from Symbicort to [facility] due to insurance.  Covid-19 in 2023. Takes PPI for reflux. 2 courses of prednisone due to bronchitis. 2024 eos 200. Today's spirometry showed: possible restrictive disease with 3% improvement in FEV1 post bronchodilator treatment. Clinically feeling unchanged.  Daily controller medication(s):continue Breo 1 puff once a day and rinse mouth after each use.  May use albuterol rescue inhaler 2 puffs every 4 to 6 hours as needed for shortness of breath, chest tightness, coughing, and wheezing. Monitor frequency of use.  Get spirometry at next visit. If worsening, will increase Breo to and consider adding on biologics next.

## 2022-11-13 DIAGNOSIS — L299 Pruritus, unspecified: Secondary | ICD-10-CM | POA: Diagnosis not present

## 2022-11-13 DIAGNOSIS — L509 Urticaria, unspecified: Secondary | ICD-10-CM | POA: Diagnosis not present

## 2022-11-13 DIAGNOSIS — R21 Rash and other nonspecific skin eruption: Secondary | ICD-10-CM | POA: Diagnosis not present

## 2022-11-13 DIAGNOSIS — J3089 Other allergic rhinitis: Secondary | ICD-10-CM | POA: Diagnosis not present

## 2022-11-22 ENCOUNTER — Encounter: Payer: Self-pay | Admitting: Allergy

## 2022-11-25 DIAGNOSIS — J309 Allergic rhinitis, unspecified: Secondary | ICD-10-CM

## 2022-11-26 LAB — ALLERGENS W/TOTAL IGE AREA 2
Alternaria Alternata IgE: <0.1 kU/L
Aspergillus Fumigatus IgE: <0.1 kU/L
Bermuda Grass IgE: <0.1 kU/L
Cat Dander IgE: 1.7 kU/L — AB
Cedar, Mountain IgE: <0.1 kU/L
Cladosporium Herbarum IgE: <0.1 kU/L
Cockroach, German IgE: <0.1 kU/L
Common Silver Birch IgE: <0.1 kU/L
Cottonwood IgE: <0.1 kU/L
D Farinae IgE: <0.1 kU/L
D Pteronyssinus IgE: <0.1 kU/L
Dog Dander IgE: 5.87 kU/L — AB
Elm, American IgE: <0.1 kU/L
Johnson Grass IgE: <0.1 kU/L
Maple/Box Elder IgE: <0.1 kU/L
Mouse Urine IgE: 0.16 kU/L — AB
Oak, White IgE: <0.1 kU/L
Pecan, Hickory IgE: <0.1 kU/L
Penicillium Chrysogen IgE: <0.1 kU/L
Pigweed, Rough IgE: <0.1 kU/L
Ragweed, Short IgE: <0.1 kU/L
Sheep Sorrel IgE Qn: <0.1 kU/L
Timothy Grass IgE: <0.1 kU/L
White Mulberry IgE: <0.1 kU/L

## 2022-11-26 LAB — ALPHA-GAL PANEL
Allergen Lamb IgE: <0.1 kU/L
Beef IgE: <0.1 kU/L
IgE (Immunoglobulin E), Serum: 81 IU/mL (ref 6–495)
O215-IgE Alpha-Gal: <0.1 kU/L
Pork IgE: <0.1 kU/L

## 2022-11-26 LAB — C3 AND C4
Complement C3, Serum: 158 mg/dL (ref 82–167)
Complement C4, Serum: 35 mg/dL (ref 12–38)

## 2022-11-26 LAB — TRYPTASE: Tryptase: 4.7 ug/L (ref 2.2–13.2)

## 2022-11-26 LAB — ANA W/REFLEX: Anti Nuclear Antibody (ANA): NEGATIVE

## 2022-11-26 LAB — CHRONIC URTICARIA: cu index: 11.9 — ABNORMAL HIGH (ref <10)

## 2022-11-26 LAB — SEDIMENTATION RATE: Sed Rate: 27 mm/hr (ref 0–32)

## 2022-11-26 LAB — C-REACTIVE PROTEIN: CRP: 8 mg/L (ref 0–10)

## 2022-11-28 ENCOUNTER — Ambulatory Visit: Payer: BC Managed Care – PPO

## 2022-12-04 ENCOUNTER — Encounter: Payer: Self-pay | Admitting: Allergy

## 2022-12-05 ENCOUNTER — Ambulatory Visit: Payer: BC Managed Care – PPO

## 2022-12-30 ENCOUNTER — Other Ambulatory Visit: Payer: Self-pay | Admitting: Allergy

## 2022-12-30 DIAGNOSIS — J3089 Other allergic rhinitis: Secondary | ICD-10-CM

## 2023-01-02 ENCOUNTER — Other Ambulatory Visit: Payer: Self-pay | Admitting: Nurse Practitioner

## 2023-01-02 ENCOUNTER — Other Ambulatory Visit: Payer: Self-pay | Admitting: Allergy

## 2023-01-02 DIAGNOSIS — J3081 Allergic rhinitis due to animal (cat) (dog) hair and dander: Secondary | ICD-10-CM | POA: Diagnosis not present

## 2023-01-02 NOTE — Progress Notes (Signed)
EXP 01/02/24 

## 2023-01-02 NOTE — Progress Notes (Signed)
Aeroallergen Immunotherapy  Ordering Provider: Dr. Yoon Kim  Patient Details Name: Michelle Kane MRN: [id number] Date of Birth: 06/06/1986  Order 1 of 1  Vial Label: CD  0.5 ml (Volume)  1:10 Concentration -- Cat Hair 0.5 ml (Volume)  1:10 Concentration -- Dog Epithelia   1.0  ml Extract Subtotal 4.0  ml Diluent 5.0  ml Maintenance Total  Schedule:  B Silver Vial (1:1,000,000): Schedule B (6 doses) Blue Vial (1:100,000): Schedule B (6 doses) Yellow Vial (1:10,000): Schedule B (6 doses) Green Vial (1:1,000): Schedule B (6 doses) Red Vial (1:100): Schedule A (14 doses)  Special Instructions: once per week build up.     

## 2023-01-02 NOTE — Progress Notes (Deleted)
Aeroallergen Immunotherapy  Ordering Provider: Dr. Yoon Kim  Patient Details Name: Michelle Kane MRN: [id number] Date of Birth: 04/03/1986  Order 1 of 1  Vial Label: CD  0.5 ml (Volume)  1:10 Concentration -- Cat Hair 0.5 ml (Volume)  1:10 Concentration -- Dog Epithelia   1.0  ml Extract Subtotal 4.0  ml Diluent 5.0  ml Maintenance Total  Schedule:  B Silver Vial (1:1,000,000): Schedule B (6 doses) Blue Vial (1:100,000): Schedule B (6 doses) Yellow Vial (1:10,000): Schedule B (6 doses) Green Vial (1:1,000): Schedule B (6 doses) Red Vial (1:100): Schedule A (14 doses)  Special Instructions: once per week build up.     

## 2023-01-02 NOTE — Progress Notes (Signed)
Aeroallergen Immunotherapy  Ordering Provider: Dr. Wyline Mood  Patient Details Name: Michelle Kane MRN: [id number] Date of Birth: [birth year]  Order 1 of 1  Vial Label: CD  0.5 ml (Volume)  1:10 Concentration -- Cat Hair 0.5 ml (Volume)  1:10 Concentration -- Dog Epithelia   1.0  ml Extract Subtotal 4.0  ml Diluent 5.0  ml Maintenance Total  Schedule:  B Silver Vial (1:1,000,000): Schedule B (6 doses) Blue Vial (1:100,000): Schedule B (6 doses) Yellow Vial (1:10,000): Schedule B (6 doses) Green Vial (1:1,000): Schedule B (6 doses) Red Vial (1:100): Schedule A (14 doses)  Special Instructions: once per week build up.

## 2023-01-06 ENCOUNTER — Encounter: Payer: Self-pay | Admitting: Nurse Practitioner

## 2023-01-06 NOTE — Telephone Encounter (Signed)
Requesting: WEGOVY 0.5 MG/0.5 ML PEN  Last Visit: 11/01/2022 Next Visit: 01/31/2023 Last Refill: 11/01/2022  Please Advise

## 2023-01-07 ENCOUNTER — Ambulatory Visit (INDEPENDENT_AMBULATORY_CARE_PROVIDER_SITE_OTHER): Payer: BC Managed Care – PPO | Admitting: Allergy

## 2023-01-07 ENCOUNTER — Encounter: Payer: Self-pay | Admitting: Allergy

## 2023-01-07 ENCOUNTER — Other Ambulatory Visit: Payer: Self-pay

## 2023-01-07 VITALS — BP 120/62 | HR 97 | Temp 98.2°F | Resp 16

## 2023-01-07 DIAGNOSIS — J4540 Moderate persistent asthma, uncomplicated: Secondary | ICD-10-CM | POA: Diagnosis not present

## 2023-01-07 DIAGNOSIS — J3089 Other allergic rhinitis: Secondary | ICD-10-CM

## 2023-01-07 DIAGNOSIS — K219 Gastro-esophageal reflux disease without esophagitis: Secondary | ICD-10-CM

## 2023-01-07 DIAGNOSIS — H1013 Acute atopic conjunctivitis, bilateral: Secondary | ICD-10-CM

## 2023-01-07 DIAGNOSIS — L282 Other prurigo: Secondary | ICD-10-CM

## 2023-01-07 DIAGNOSIS — J302 Other seasonal allergic rhinitis: Secondary | ICD-10-CM | POA: Diagnosis not present

## 2023-01-07 DIAGNOSIS — H1010 Acute atopic conjunctivitis, unspecified eye: Secondary | ICD-10-CM

## 2023-01-07 MED ORDER — FLUTICASONE FUROATE-VILANTEROL 200-25 MCG/ACT IN AEPB: 1.0000 | INHALATION_SPRAY | Freq: Every day | RESPIRATORY_TRACT | 5 refills | Status: AC

## 2023-01-07 NOTE — Progress Notes (Signed)
Immunotherapy   Patient Details  Name: MIKAILAH MOREL MRN: [id number] Date of Birth: 02-Sep-1985  01/07/2023  Donnal Moat started allergy injections today. Jakaya waited in the office for 30 minutes without any reactions. Following schedule: B  Frequency:1 time per week Epi-Pen:Epi-Pen Available   Consent signed and patient instructions given.   Florence Canner 01/07/2023, 5:13 PM

## 2023-01-07 NOTE — Assessment & Plan Note (Signed)
Past history - perennial rhino conjunctivitis symptoms x 6+ yrs which flares in the spring and fall. 2021 skin testing was positive to grass, trees, weed, ragweed, mold, dust mites, cat, dog, horse - patient had reaction after testing.  Interim history - 2024 bloodwork only positive to cats and dogs.  Continue environmental control measures.  Take antihistamines as below. Stat allergy injections - first shot given today. Make sure you wait 30 min after each shot and have your Epipen with you. No shots if you are sick or asthma is acting up.

## 2023-01-07 NOTE — Patient Instructions (Addendum)
Skin:  Continue with allegra once a day and Xyzal once a day. If symptoms are not controlled or causes drowsiness let us know. Continue pepcid (famotidine) 20mg  twice a day.  Continue Singulair (montelukast) 10mg  daily at night. Avoid the following potential triggers: alcohol, tight clothing, NSAIDs, hot showers and getting overheated. Continue proper skin care.   Asthma Daily controller medication(s): start Breo 1 puff once a day and rinse mouth after each use.  May use albuterol rescue inhaler 2 puffs every 4 to 6 hours as needed for shortness of breath, chest tightness, coughing, and wheezing. Monitor frequency of use.  Breathing control goals:  Full participation in all desired activities (may need albuterol before activity) Albuterol use two times or less a week on average (not counting use with activity) Cough interfering with sleep two times or less a month Oral steroids no more than once a year No hospitalizations   Environmental allergies Continue environmental control measures.  Take antihistamines as above. Stat allergy injections - first shot given today. Make sure you wait 30 min after each shot and have your epipen with you. No shots if you are sick or asthma is acting up.  Follow up in 3 months or sooner if needed.

## 2023-01-07 NOTE — Assessment & Plan Note (Signed)
Continue Protonix 40mg once a day as before.  

## 2023-01-07 NOTE — Progress Notes (Signed)
Follow Up Note  RE: [name] MRN: [id number] DOB: [birth year] Date of Office Visit: 01/07/2023  Referring provider: Gerre Scull, NP Primary care provider: Gerre Scull, NP  Chief Complaint: Follow-up  History of Present Illness: I had the pleasure of seeing Michelle Kane for a follow up visit at the [facility] on 01/07/2023. She is a 37 y.o. female, who is being followed for asthma, rash, allergic rhinoconjunctivitis, GERD. Her previous allergy office visit was on 11/05/2022 with Dr. Selena Batten. Today is a regular follow up visit.  Moderate persistent asthma Still taking Breo 1 puff once a day. Sometimes hears herself wheezing about 3-4 times per week and patient thinks it's due to spending more time outdoors.  Denies any ER/urgent care visits or prednisone use since the last visit.   Pruritic rash Currently taking allegra/zyrtec and Xyzal daily and famotidine BID with no improvement in symptoms. She also started Singulair this month - noted some less itching on the legs. No mood changes.  Allergic rhinitis Ready to start AIT today.    GERD (gastroesophageal reflux disease) Stable.   Assessment and Plan: Michelle Kane is a 37 y.o. female with: Moderate persistent asthma without complication Past history -  diagnosed with asthma 20+ years ago. Recently switched from Symbicort to [facility] due to insurance.  Covid-19 in 2023. Takes PPI for reflux. 2 courses of prednisone due to bronchitis. 2024 eos 200. 2024 spirometry showed: possible restrictive disease with 3% improvement in FEV1 post bronchodilator treatment. Clinically feeling unchanged.  Interim history - wheezing 3-4 times per week. Today's spirometry was normal. Daily controller medication(s): start Breo 1 puff once a day and rinse mouth after each use.  May use albuterol rescue inhaler 2 puffs every 4 to 6 hours as needed for shortness of breath, chest tightness, coughing, and wheezing.  Monitor frequency of use.  Get spirometry at next visit.  Seasonal and perennial allergic rhinoconjunctivitis Past history - perennial rhino conjunctivitis symptoms x 6+ yrs which flares in the spring and fall. 2021 skin testing was positive to grass, trees, weed, ragweed, mold, dust mites, cat, dog, horse - patient had reaction after testing.  Interim history - 2024 bloodwork only positive to cats and dogs.  Continue environmental control measures.  Take antihistamines as below. Stat allergy injections - first shot given today. Make Michelle you wait 30 min after each shot and have your Epipen with you. No shots if you are sick or asthma is acting up.  Pruritic rash Past history - pruritic rash x 6 years which mainly occurs on her legs. Worse since off antihistamines. No specific triggers noted. 2024 CBC diff, CMP, TSH unremarkable. Can't take zyrtec.  Interim history - improved with below regimen. 2024 bloodwork (ANA, Esr, Crp, tryptase, alpha gal normal; slightly elevated CU) Continue with allegra once a day and Xyzal once a day. If symptoms are not controlled or causes drowsiness let us know. Continue Pepcid (famotidine) 20mg  twice a day.  Continue Singulair (montelukast) 10mg  daily at night. Avoid the following potential triggers: alcohol, tight clothing, NSAIDs, hot showers and getting overheated. Continue proper skin care.   GERD (gastroesophageal reflux disease) Continue Protonix 40mg  once a day as before.   Return in about 3 months (around 04/09/2023).  Meds ordered this encounter  Medications   fluticasone furoate-vilanterol (BREO ELLIPTA) 200-25 MCG/ACT AEPB    Sig: Inhale 1 puff into the lungs daily. Rinse mouth after each use.    Dispense:  60 each    Refill:  5   Lab Orders  No laboratory test(s) ordered today    Diagnostics: Spirometry:  Tracings reviewed. Her effort: Good reproducible efforts. FVC: 4.59L FEV1: 3.78L, 112% predicted FEV1/FVC ratio:  82% Interpretation: Spirometry consistent with normal pattern.  Please see scanned spirometry results for details.  Medication List:  Current Outpatient Medications  Medication Sig Dispense Refill   albuterol (VENTOLIN HFA) 108 (90 Base) MCG/ACT inhaler PLEASE SEE ATTACHED FOR DETAILED DIRECTIONS 6.7 each 1   EPINEPHrine 0.3 mg/0.3 mL IJ SOAJ injection Inject 0.3 mg into the muscle as needed for anaphylaxis. 2 each 1   famotidine (PEPCID) 20 MG tablet Take 1 tablet (20 mg total) by mouth 2 (two) times daily. 60 tablet 3   fexofenadine (ALLEGRA) 60 MG tablet Take 60 mg by mouth 2 (two) times daily.     fluticasone furoate-vilanterol (BREO ELLIPTA) 200-25 MCG/ACT AEPB Inhale 1 puff into the lungs daily. Rinse mouth after each use. 60 each 5   levocetirizine (XYZAL) 5 MG tablet Take 5 mg by mouth in the morning and at bedtime.     LORazepam (ATIVAN) 1 MG tablet Take 1 mg by mouth every 6 (six) hours as needed for anxiety.     montelukast (SINGULAIR) 10 MG tablet Take 1 tablet (10 mg total) by mouth at bedtime. 30 tablet 3   pantoprazole (PROTONIX) 40 MG tablet Take 1 tablet (40 mg total) by mouth daily. Take 30-60 min before first meal of the day 90 tablet 3   Semaglutide-Weight Management (WEGOVY) 0.5 MG/0.5ML SOAJ INJECT 0.5 MG INTO THE SKIN ONE TIME PER WEEK 2 mL 1   No current facility-administered medications for this visit.   Allergies: Allergies  Allergen Reactions   Buspar [Buspirone] Other (See Comments)    Mouth, lips tingling, throat tightness, chest pain   Vicodin [Hydrocodone-Acetaminophen] Itching, Nausea And Vomiting and Other (See Comments)    GI pains also   Labetalol     Causes congestive heart failure   Zyrtec [Cetirizine]     Worsening anxiety   I reviewed her past medical history, social history, family history, and environmental history and no significant changes have been reported from her previous visit.  Review of Systems  Constitutional:  Negative for  appetite change, chills, fever and unexpected weight change.  HENT:  Negative for congestion and rhinorrhea.   Eyes:  Positive for itching.       Burning  Respiratory:  Positive for cough, chest tightness, shortness of breath and wheezing.   Cardiovascular:  Negative for chest pain.  Gastrointestinal:  Negative for abdominal pain.  Genitourinary:  Negative for difficulty urinating.  Skin:  Positive for rash.       itching  Allergic/Immunologic: Positive for environmental allergies. Negative for food allergies.  Neurological:  Negative for headaches.    Objective: BP 120/62   Pulse 97   Temp 98.2 F (36.8 C)   Resp 16   LMP 12/09/2016   SpO2 96%  There is no height or weight on file to calculate BMI. Physical Exam Vitals and nursing note reviewed.  Constitutional:      Appearance: Normal appearance. She is well-developed. She is obese.  HENT:     Head: Normocephalic and atraumatic.     Right Ear: Tympanic membrane and external ear normal.     Left Ear: Tympanic membrane and external ear normal.     Nose: Nose normal.     Mouth/Throat:     Mouth:  Mucous membranes are moist.     Pharynx: Oropharynx is clear.  Eyes:     Conjunctiva/sclera: Conjunctivae normal.  Cardiovascular:     Rate and Rhythm: Normal rate and regular rhythm.     Heart sounds: Normal heart sounds. No murmur heard.    No friction rub. No gallop.  Pulmonary:     Effort: Pulmonary effort is normal.     Breath sounds: Normal breath sounds. No wheezing, rhonchi or rales.  Musculoskeletal:     Cervical back: Neck supple.  Skin:    General: Skin is warm.     Findings: No rash.  Neurological:     Mental Status: She is alert and oriented to person, place, and time.  Psychiatric:        Behavior: Behavior normal.    Previous notes and tests were reviewed. The plan was reviewed with the patient/family, and all questions/concerned were addressed.  It was my pleasure to see Michelle Kane today and participate in  her care. Please feel free to contact me with any questions or concerns.  Sincerely,  Wyline Mood, DO Allergy & Immunology  [facility] office: [phone] [facility] office: [phone]

## 2023-01-07 NOTE — Assessment & Plan Note (Signed)
Past history - pruritic rash x 6 years which mainly occurs on her legs. Worse since off antihistamines. No specific triggers noted. 2024 CBC diff, CMP, TSH unremarkable. Can't take zyrtec.  Interim history - improved with below regimen. 2024 bloodwork (ANA, Esr, Crp, tryptase, alpha gal normal; slightly elevated CU) Continue with allegra once a day and Xyzal once a day. If symptoms are not controlled or causes drowsiness let us know. Continue Pepcid (famotidine) 20mg  twice a day.  Continue Singulair (montelukast) 10mg  daily at night. Avoid the following potential triggers: alcohol, tight clothing, NSAIDs, hot showers and getting overheated. Continue proper skin care.

## 2023-01-07 NOTE — Assessment & Plan Note (Signed)
Past history -  diagnosed with asthma 20+ years ago. Recently switched from Symbicort to [facility] due to insurance.  Covid-19 in 2023. Takes PPI for reflux. 2 courses of prednisone due to bronchitis. 2024 eos 200. 2024 spirometry showed: possible restrictive disease with 3% improvement in FEV1 post bronchodilator treatment. Clinically feeling unchanged.  Interim history - wheezing 3-4 times per week. Today's spirometry was normal. Daily controller medication(s): start Breo 1 puff once a day and rinse mouth after each use.  May use albuterol rescue inhaler 2 puffs every 4 to 6 hours as needed for shortness of breath, chest tightness, coughing, and wheezing. Monitor frequency of use.  Get spirometry at next visit.

## 2023-01-14 ENCOUNTER — Ambulatory Visit (INDEPENDENT_AMBULATORY_CARE_PROVIDER_SITE_OTHER): Payer: Medicaid Other

## 2023-01-14 ENCOUNTER — Ambulatory Visit: Payer: Medicaid Other

## 2023-01-14 DIAGNOSIS — J3089 Other allergic rhinitis: Secondary | ICD-10-CM | POA: Diagnosis not present

## 2023-01-16 ENCOUNTER — Other Ambulatory Visit (HOSPITAL_COMMUNITY): Payer: Self-pay

## 2023-01-23 ENCOUNTER — Ambulatory Visit (INDEPENDENT_AMBULATORY_CARE_PROVIDER_SITE_OTHER): Payer: Medicaid Other

## 2023-01-23 DIAGNOSIS — J3089 Other allergic rhinitis: Secondary | ICD-10-CM

## 2023-01-27 DIAGNOSIS — M542 Cervicalgia: Secondary | ICD-10-CM | POA: Diagnosis not present

## 2023-01-27 DIAGNOSIS — M5441 Lumbago with sciatica, right side: Secondary | ICD-10-CM | POA: Diagnosis not present

## 2023-01-27 DIAGNOSIS — M5442 Lumbago with sciatica, left side: Secondary | ICD-10-CM | POA: Diagnosis not present

## 2023-01-30 ENCOUNTER — Ambulatory Visit (INDEPENDENT_AMBULATORY_CARE_PROVIDER_SITE_OTHER): Payer: Medicaid Other

## 2023-01-30 DIAGNOSIS — J3089 Other allergic rhinitis: Secondary | ICD-10-CM | POA: Diagnosis not present

## 2023-01-31 ENCOUNTER — Telehealth: Payer: Self-pay | Admitting: Nurse Practitioner

## 2023-01-31 ENCOUNTER — Ambulatory Visit: Payer: Medicaid Other | Admitting: Nurse Practitioner

## 2023-01-31 NOTE — Telephone Encounter (Signed)
7.19.24 no show/pt did not get letter because of the outage

## 2023-02-01 DIAGNOSIS — M5416 Radiculopathy, lumbar region: Secondary | ICD-10-CM | POA: Diagnosis not present

## 2023-02-01 DIAGNOSIS — M4696 Unspecified inflammatory spondylopathy, lumbar region: Secondary | ICD-10-CM | POA: Diagnosis not present

## 2023-02-03 ENCOUNTER — Encounter: Payer: Self-pay | Admitting: Nurse Practitioner

## 2023-02-03 NOTE — Telephone Encounter (Signed)
3rd no show, unable to bill fee (Medicaid), dismissal generated per provider

## 2023-02-03 NOTE — Telephone Encounter (Signed)
Noted  

## 2023-02-06 ENCOUNTER — Ambulatory Visit (INDEPENDENT_AMBULATORY_CARE_PROVIDER_SITE_OTHER): Payer: Medicaid Other

## 2023-02-06 DIAGNOSIS — J3089 Other allergic rhinitis: Secondary | ICD-10-CM | POA: Diagnosis not present

## 2023-02-10 ENCOUNTER — Telehealth: Payer: Self-pay | Admitting: General Practice

## 2023-02-10 NOTE — Telephone Encounter (Signed)
Pt is requesting that her dismissal be reversed. She stated that she called and cancelled the appt on 7/19. Her father was having surgery and she had to be there to help her mom.

## 2023-02-13 ENCOUNTER — Ambulatory Visit (INDEPENDENT_AMBULATORY_CARE_PROVIDER_SITE_OTHER): Payer: Medicaid Other

## 2023-02-13 DIAGNOSIS — J3089 Other allergic rhinitis: Secondary | ICD-10-CM | POA: Diagnosis not present

## 2023-02-17 DIAGNOSIS — M545 Low back pain, unspecified: Secondary | ICD-10-CM | POA: Diagnosis not present

## 2023-02-17 NOTE — Telephone Encounter (Signed)
Please advise if you are ok reversing dismissal and giving pt another chance?

## 2023-02-18 NOTE — Telephone Encounter (Signed)
Originally pt father was due to have surgery 7/23 but states he had to go for preop 7/19 and they admitted him that day. Pt states she called on 7/17 to cancel the appt 7/19 and spoke to someone in the office here *during business hours*.  Ok to reverse?

## 2023-02-20 ENCOUNTER — Telehealth: Payer: Self-pay

## 2023-02-20 NOTE — Telephone Encounter (Signed)
Left a message for patient about office closing early due to weather.  [place] [phone]

## 2023-02-24 NOTE — Telephone Encounter (Signed)
Spoke to pt and notified her that we have reversed dismissal. She is taking care of both parent right now but will be rescheduling once things settle down.   Pt states the Wegovy was messing with her digestive system.

## 2023-02-24 NOTE — Telephone Encounter (Signed)
Noted, will discuss at next visit.

## 2023-03-03 DIAGNOSIS — M5416 Radiculopathy, lumbar region: Secondary | ICD-10-CM | POA: Diagnosis not present

## 2023-03-13 ENCOUNTER — Ambulatory Visit: Payer: Medicaid Other | Admitting: Nurse Practitioner

## 2023-03-13 ENCOUNTER — Encounter: Payer: Self-pay | Admitting: Nurse Practitioner

## 2023-03-13 VITALS — BP 138/86 | HR 100 | Temp 98.3°F | Ht 67.0 in | Wt 287.2 lb

## 2023-03-13 DIAGNOSIS — Z6841 Body Mass Index (BMI) 40.0 and over, adult: Secondary | ICD-10-CM

## 2023-03-13 DIAGNOSIS — F411 Generalized anxiety disorder: Secondary | ICD-10-CM | POA: Diagnosis not present

## 2023-03-13 DIAGNOSIS — R5383 Other fatigue: Secondary | ICD-10-CM

## 2023-03-13 DIAGNOSIS — Z90710 Acquired absence of both cervix and uterus: Secondary | ICD-10-CM | POA: Diagnosis not present

## 2023-03-13 NOTE — Assessment & Plan Note (Addendum)
Acute fatigue secondary to caregiver strain and  tiredness around the increase amount of daily tasks she has.  Will check TSH today.

## 2023-03-13 NOTE — Assessment & Plan Note (Addendum)
Chronic (ongoing). Patient states the [facility] caused her digestive issues. She has stopped the injection and does not want to start any other weight loss medications at this time. She request to have her hormone level checked today. She will be interested in oral weight loss medication if her hormone level is normal. Will check TSH, Estradiol, Testosterone, and FSH/LH hormone level.

## 2023-03-13 NOTE — Progress Notes (Signed)
Established Patient Office Visit  Subjective   Patient ID: Michelle Kane, female    DOB: 12-06-1985  Age: 37 y.o. MRN: [id number]  Chief Complaint  Patient presents with   Weight Management Follow Up    Discuss changing medication    HPI Patient presents to discuss weight management and changing weight loss medication. She states she has not had a good experience with the wegovy injections. She shares she has had digestive issues. She has stopped the injections. 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 It has been off and own with these symptoms however when it happens its bad. She continues stating she has not been able to be consistent with taking medication because it has been on back order a lot of the time. She reveals she typically don't have a hard time with weight loss but since the hysterectomy she has noticed when she gains weight she has a hard time losing it.  She shared after initially having the hysterectomy she did start hormone replacement for 6 months but she stopped. She states she is having hot flashes more at night during the day, increased irritability. She is requesting to get her hormone levels check today. She states that she is trying to loss weight by managing her diet by eating a high protein low carbohydrate foods. She drinks an appropriate amount of water. She states she is not interested in weight loss injections any further however she would be open to taking an oral medication if her hormone levels are with normal range. She states she is not able to exercise as much as she would like secondary to lower back issues however she is moving as much as she can considering being fatigued from daily tasks. Denies any cardiopulmonary concerns.   Review of Systems  Constitutional:  Positive for diaphoresis. Negative for fever.       Patient states she having hot flashes more at night than during the day.   Respiratory:   Negative for cough and shortness of breath.   Cardiovascular:  Negative for chest pain and palpitations.  Gastrointestinal:  Positive for diarrhea. Negative for heartburn.       She states she noticed increase in burping   Psychiatric/Behavioral: Negative.        Objective:     BP 138/86 (BP Location: Left Arm)   Pulse 100   Temp 98.3 F (36.8 C) (Oral)   Ht 5\' 7"  (1.702 m)   Wt 287 lb 3.2 oz (130.3 kg)   LMP 12/09/2016   SpO2 99%   BMI 44.98 kg/m    Physical Exam Constitutional:      General: She is not in acute distress.    Appearance: Normal appearance. She is not ill-appearing.  HENT:     Head: Normocephalic and atraumatic.  Eyes:     Extraocular Movements: Extraocular movements intact.  Cardiovascular:     Rate and Rhythm: Normal rate and regular rhythm.     Pulses: Normal pulses.     Heart sounds: Normal heart sounds. No murmur heard.    No friction rub. No gallop.  Pulmonary:     Effort: Pulmonary effort is normal. No respiratory distress.     Breath sounds: Normal breath sounds. No wheezing or rhonchi.  Chest:     Chest wall: No tenderness.  Neurological:     Mental Status: She is alert and oriented to person, place, and time.  Psychiatric:  Mood and Affect: Mood normal.        Behavior: Behavior normal.        Thought Content: Thought content normal.        Judgment: Judgment normal.      No results found for any visits on 03/13/23.    The ASCVD Risk score (Arnett DK, et al., 2019) failed to calculate for the following reasons:   The 2019 ASCVD risk score is only valid for ages 41 to 75    Assessment & Plan:   Problem List Items Addressed This Visit       Other   Anxiety state - Primary    Chronic (controlled). She states is taking care of her parents and her autistic child has started back school. She states life has been a bit overwhelming which has cause caused her to be more irritable as well. She is followed by psychiatry and is  prescribed lorazepam as needed for anxiety. She shares she has needed it more lately. Encouraged to continue taking Lorazepam 1 mg every 6 hours as needed for anxiety per psychiatry.       Morbid obesity (HCC)    Chronic (ongoing). Patient states the [facility] caused her digestive issues. She has stopped the injection and does not want to start any other weight loss medications at this time. She request to have her hormone level checked today. She will be interested in oral weight loss medication if her hormone level is normal. Will check TSH, Estradiol, Testosterone, and FSH/LH hormone level.       Fatigue    Acute fatigue secondary to caregiver strain and  tiredness around the increase amount of daily tasks she has.  Will check TSH today.       Relevant Orders   TSH   History of hysterectomy    Chronic (ongoing). Patient concerned she may need hormone therapy. She is experiencing hor flashes, fatigue, and irritability. Requesting hormone levels to be checked today. Will check Estradiol, testosterone and FSH/LH hormone levels.       Relevant Orders   FSH/LH   Testosterone   Estradiol    Return in about 3 months (around 06/13/2023) for weight management .    Bishop Dublin, RN

## 2023-03-13 NOTE — Assessment & Plan Note (Addendum)
Chronic (ongoing). Patient concerned she may need hormone therapy. She is experiencing hor flashes, fatigue, and irritability. Requesting hormone levels to be checked today. Will check Estradiol, testosterone and FSH/LH hormone levels.

## 2023-03-13 NOTE — Assessment & Plan Note (Addendum)
Chronic (controlled). She states is taking care of her parents and her autistic child has started back school. She states life has been a bit overwhelming which has cause caused her to be more irritable as well. She is followed by psychiatry and is prescribed lorazepam as needed for anxiety. She shares she has needed it more lately. Encouraged to continue taking Lorazepam 1 mg every 6 hours as needed for anxiety per psychiatry.

## 2023-03-13 NOTE — Patient Instructions (Signed)
It was great to see you!  We are checking your labs today and will let you know the results via mychart/phone.   Stop the wegovy like we discussed  Let's follow-up in 3 months, sooner if you have concerns.  If a referral was placed today, you will be contacted for an appointment. Please note that routine referrals can sometimes take up to 3-4 weeks to process. Please call our office if you haven't heard anything after this time frame.  Take care,  Rodman Pickle, NP

## 2023-03-14 LAB — TSH: TSH: 0.96 u[IU]/mL (ref 0.35–5.50)

## 2023-03-14 LAB — FSH/LH
FSH: 5.5 m[IU]/mL
LH: 2.4 m[IU]/mL

## 2023-03-14 LAB — ESTRADIOL: Estradiol: 37 pg/mL

## 2023-03-14 LAB — TESTOSTERONE: Testosterone: 25.87 ng/dL (ref 15.00–40.00)

## 2023-03-20 ENCOUNTER — Ambulatory Visit (INDEPENDENT_AMBULATORY_CARE_PROVIDER_SITE_OTHER): Payer: Medicaid Other

## 2023-03-20 DIAGNOSIS — J309 Allergic rhinitis, unspecified: Secondary | ICD-10-CM

## 2023-03-25 DIAGNOSIS — M5416 Radiculopathy, lumbar region: Secondary | ICD-10-CM | POA: Diagnosis not present

## 2023-04-08 ENCOUNTER — Ambulatory Visit: Payer: Medicaid Other | Admitting: Nurse Practitioner

## 2023-04-08 ENCOUNTER — Encounter: Payer: Self-pay | Admitting: Nurse Practitioner

## 2023-04-08 VITALS — BP 122/84 | HR 82 | Temp 98.4°F | Ht 67.0 in | Wt 292.6 lb

## 2023-04-08 DIAGNOSIS — M5416 Radiculopathy, lumbar region: Secondary | ICD-10-CM | POA: Diagnosis not present

## 2023-04-08 DIAGNOSIS — J029 Acute pharyngitis, unspecified: Secondary | ICD-10-CM

## 2023-04-08 LAB — POC COVID19 BINAXNOW: SARS Coronavirus 2 Ag: NEGATIVE

## 2023-04-08 LAB — POCT RAPID STREP A (OFFICE): Rapid Strep A Screen: NEGATIVE

## 2023-04-08 MED ORDER — AMOXICILLIN 500 MG PO CAPS: 500.0000 mg | ORAL_CAPSULE | Freq: Two times a day (BID) | ORAL | 0 refills | Status: AC

## 2023-04-08 NOTE — Patient Instructions (Signed)
It was great to see you!  Start amoxcillin twice a day with food   Continue ibuprofen as needed.  Try gargling with warm salt water. Continue the hot tea with honey  Let's follow-up if your symptoms worsen or don't improve.   Take care,  Rodman Pickle, NP

## 2023-04-08 NOTE — Progress Notes (Signed)
Acute Office Visit  Subjective:     Patient ID: Michelle Kane, female    DOB: [birth year], 37 y.o.   MRN: [id number]  Chief Complaint  Patient presents with   Sore Throat    With headache, ear pain, pain behind right eye, sharp pain in throat   HPI:  Patient is in today for sore throat, headache, and ear pain for   UPPER RESPIRATORY TRACT INFECTION  Fever: no Cough: no Shortness of breath:  comes and goes Wheezing: no Chest pain: no Chest tightness: no Chest congestion: no Nasal congestion: no Runny nose: no Post nasal drip: no Sneezing: no Sore throat: yes Swollen glands: yes Sinus pressure: yes Headache: yes Face pain: yes Toothache: no Ear pain: yes "right Ear pressure: no bilateral Eyes red/itching:no Eye drainage/crusting: no  Vomiting: no Rash: no Fatigue: yes Sick contacts: no Strep contacts: no  Context: stable Recurrent sinusitis: no Relief with OTC cold/cough medications: no  Treatments attempted: ibuprofen   ROS See pertinent positives and negatives per HPI.     Objective:    BP 122/84 (BP Location: Right Arm)   Pulse 82   Temp 98.4 F (36.9 C) (Oral)   Ht 5\' 7"  (1.702 m)   Wt 292 lb 9.6 oz (132.7 kg)   LMP 12/09/2016   SpO2 99%   BMI 45.83 kg/m    Physical Exam Vitals and nursing note reviewed.  Constitutional:      General: She is not in acute distress.    Appearance: Normal appearance.  HENT:     Head: Normocephalic.     Right Ear: Tympanic membrane, ear canal and external ear normal.     Left Ear: Tympanic membrane, ear canal and external ear normal.     Mouth/Throat:     Mouth: Mucous membranes are moist.     Pharynx: Posterior oropharyngeal erythema present. No oropharyngeal exudate.  Eyes:     Conjunctiva/sclera: Conjunctivae normal.  Cardiovascular:     Rate and Rhythm: Normal rate and regular rhythm.     Pulses: Normal pulses.     Heart sounds: Normal heart sounds.  Pulmonary:     Effort: Pulmonary effort is  normal.     Breath sounds: Normal breath sounds.  Musculoskeletal:     Cervical back: Normal range of motion and neck supple. Tenderness present.  Lymphadenopathy:     Cervical: Cervical adenopathy (right) present.  Skin:    General: Skin is warm.  Neurological:     General: No focal deficit present.     Mental Status: She is alert and oriented to person, place, and time.  Psychiatric:        Mood and Affect: Mood normal.        Behavior: Behavior normal.        Thought Content: Thought content normal.        Judgment: Judgment normal.     Results for orders placed or performed in visit on 04/08/23  POCT rapid strep A  Result Value Ref Range   Rapid Strep A Screen Negative Negative  POC COVID-19 BinaxNow  Result Value Ref Range   SARS Coronavirus 2 Ag Negative Negative        Assessment & Plan:   Problem List Items Addressed This Visit   None Visit Diagnoses     Sore throat    -  Primary   POC strep and covid negative.However with posterior cervical lymphadenopathy,concern for strep. Start amoxcillin 500mg  BID x10 days.Cont. ibuprofen and  hot tea.   Relevant Orders   POCT rapid strep A (Completed)   POC COVID-19 BinaxNow (Completed)       Meds ordered this encounter  Medications   amoxicillin (AMOXIL) 500 MG capsule    Sig: Take 1 capsule (500 mg total) by mouth 2 (two) times daily for 10 days.    Dispense:  20 capsule    Refill:  0    Return if symptoms worsen or fail to improve.  Gerre Scull, NP

## 2023-05-19 DIAGNOSIS — M5416 Radiculopathy, lumbar region: Secondary | ICD-10-CM | POA: Diagnosis not present

## 2023-06-16 ENCOUNTER — Other Ambulatory Visit (HOSPITAL_BASED_OUTPATIENT_CLINIC_OR_DEPARTMENT_OTHER): Payer: Self-pay

## 2023-06-16 ENCOUNTER — Ambulatory Visit: Payer: Medicaid Other | Admitting: Nurse Practitioner

## 2023-06-16 ENCOUNTER — Encounter: Payer: Self-pay | Admitting: Nurse Practitioner

## 2023-06-16 VITALS — BP 124/80 | HR 95 | Temp 97.6°F | Ht 67.0 in | Wt 287.0 lb

## 2023-06-16 DIAGNOSIS — G8929 Other chronic pain: Secondary | ICD-10-CM

## 2023-06-16 DIAGNOSIS — M545 Low back pain, unspecified: Secondary | ICD-10-CM | POA: Diagnosis not present

## 2023-06-16 DIAGNOSIS — J4540 Moderate persistent asthma, uncomplicated: Secondary | ICD-10-CM | POA: Diagnosis not present

## 2023-06-16 DIAGNOSIS — Z23 Encounter for immunization: Secondary | ICD-10-CM

## 2023-06-16 DIAGNOSIS — Z6841 Body Mass Index (BMI) 40.0 and over, adult: Secondary | ICD-10-CM | POA: Diagnosis not present

## 2023-06-16 DIAGNOSIS — K219 Gastro-esophageal reflux disease without esophagitis: Secondary | ICD-10-CM | POA: Diagnosis not present

## 2023-06-16 MED ORDER — PANTOPRAZOLE SODIUM 40 MG PO TBEC: 40.0000 mg | DELAYED_RELEASE_TABLET | Freq: Every day | ORAL | 3 refills | Status: DC

## 2023-06-16 MED ORDER — WEGOVY 0.25 MG/0.5ML ~~LOC~~ SOAJ
0.2500 mg | SUBCUTANEOUS | 0 refills | Status: DC
Start: 2023-06-16 — End: 2023-08-09
  Filled 2023-06-16: qty 2, 28d supply, fill #0

## 2023-06-16 NOTE — Patient Instructions (Signed)
It was great to see you!  Restart the wegovy 0.25mg  injection weekly.   I have refilled the protonix.  Let's follow-up in 3 months, sooner if you have concerns.  If a referral was placed today, you will be contacted for an appointment. Please note that routine referrals can sometimes take up to 3-4 weeks to process. Please call our office if you haven't heard anything after this time frame.  Take care,  Rodman Pickle, NP

## 2023-06-16 NOTE — Assessment & Plan Note (Signed)
Chronic, stable. We will refill the Protonix 40mg  daily.

## 2023-06-16 NOTE — Assessment & Plan Note (Signed)
Chronic, ongoing. She has missed some allergy injection appointments due to bringing her parents to appointments. Encouraged her to schedule an appointment to restart these when able.

## 2023-06-16 NOTE — Assessment & Plan Note (Signed)
She noted a 5-pound weight loss and had previously stopped [place] due to insurance issues, now resolved. She experienced sulfur burps as a side effect when the dose was increased. We will restart Wegovy 0.25mg  injection weekly and plan to increase the dose at the 53-month follow-up if tolerated. Continue focusing on nutrition and exercise.

## 2023-06-16 NOTE — Assessment & Plan Note (Signed)
She is undergoing epidural injections for collapsed L5 with nerve impingement and reports improvement in sciatic nerve pain in hips and down legs after injections. We will continue the current treatment plan with the orthopedic spinal surgeon and injection specialist.

## 2023-06-16 NOTE — Progress Notes (Signed)
Established Patient Office Visit  Subjective   Patient ID: Michelle Kane, female    DOB: 11-29-1985  Age: 37 y.o. MRN: [id number]  Chief Complaint  Patient presents with   Weight Management    Follow up, Rx Refill, no concerns    HPI  Discussed the use of AI scribe software for clinical note transcription with the patient, who gave verbal consent to proceed.  History of Present Illness   The patient, with a history of obesity, presents for a follow-up visit. She reports a weight loss of five pounds since her last visit, despite the holiday season. She had previously stopped [place] due to insurance issues, but now that her insurance is covering her medications, she is considering restarting it. She recalls experiencing a side effect of burping rotten eggs after the dose was increased, which she found unpleasant but tolerable for a couple of days post-injection.  In addition to her weight management, the patient is also dealing with significant family responsibilities, as she is currently caring for both her father and mother. This has led to her stopping her allergy injections due to scheduling conflicts. She reports tightness in her chest and has had to use a fast-acting inhaler this morning.  The patient is also undergoing epidural injections for bilateral pain in her spine. She reports that the injections have helped with the sciatic nerve pain in her hips, which is a significant issue for her. She is due for another injection this month.  The patient has also been seeing a psychiatrist and is managing her mental health amidst her current challenges.        ROS See pertinent positives and negatives per HPI.    Objective:     BP 124/80 (BP Location: Left Arm)   Pulse 95   Temp 97.6 F (36.4 C)   Ht 5\' 7"  (1.702 m)   Wt 287 lb (130.2 kg)   LMP 12/09/2016   SpO2 96%   BMI 44.95 kg/m  BP Readings from Last 3 Encounters:  06/16/23 124/80  04/08/23 122/84  03/13/23  138/86   Wt Readings from Last 3 Encounters:  06/16/23 287 lb (130.2 kg)  04/08/23 292 lb 9.6 oz (132.7 kg)  03/13/23 287 lb 3.2 oz (130.3 kg)      Physical Exam Vitals and nursing note reviewed.  Constitutional:      General: She is not in acute distress.    Appearance: Normal appearance.  HENT:     Head: Normocephalic.  Eyes:     Conjunctiva/sclera: Conjunctivae normal.  Cardiovascular:     Rate and Rhythm: Normal rate and regular rhythm.     Pulses: Normal pulses.     Heart sounds: Normal heart sounds.  Pulmonary:     Effort: Pulmonary effort is normal.     Breath sounds: Normal breath sounds.  Musculoskeletal:     Cervical back: Normal range of motion.  Skin:    General: Skin is warm.  Neurological:     General: No focal deficit present.     Mental Status: She is alert and oriented to person, place, and time.  Psychiatric:        Mood and Affect: Mood normal.        Behavior: Behavior normal.        Thought Content: Thought content normal.        Judgment: Judgment normal.      Assessment & Plan:   Problem List Items Addressed This Visit  Respiratory   Moderate persistent asthma without complication - Primary    Chronic, ongoing. She has missed some allergy injection appointments due to bringing her parents to appointments. Encouraged her to schedule an appointment to restart these when able.         Digestive   GERD (gastroesophageal reflux disease)    Chronic, stable. We will refill the Protonix 40mg  daily.       Relevant Medications   pantoprazole (PROTONIX) 40 MG tablet     Other   Morbid obesity (HCC)    She noted a 5-pound weight loss and had previously stopped Wegovy due to insurance issues, now resolved. She experienced sulfur burps as a side effect when the dose was increased. We will restart Wegovy 0.25mg  injection weekly and plan to increase the dose at the 67-month follow-up if tolerated. Continue focusing on nutrition and exercise.        Relevant Medications   Semaglutide-Weight Management (WEGOVY) 0.25 MG/0.5ML SOAJ   Chronic midline low back pain without sciatica    She is undergoing epidural injections for collapsed L5 with nerve impingement and reports improvement in sciatic nerve pain in hips and down legs after injections. We will continue the current treatment plan with the orthopedic spinal surgeon and injection specialist.      Other Visit Diagnoses     Immunization due       Tdap given today.   Relevant Orders   Tdap vaccine greater than or equal to 7yo IM (Completed)       Return in about 3 months (around 09/14/2023) for CPE.    Gerre Scull, NP

## 2023-06-17 ENCOUNTER — Other Ambulatory Visit (HOSPITAL_BASED_OUTPATIENT_CLINIC_OR_DEPARTMENT_OTHER): Payer: Self-pay

## 2023-06-18 ENCOUNTER — Other Ambulatory Visit (HOSPITAL_BASED_OUTPATIENT_CLINIC_OR_DEPARTMENT_OTHER): Payer: Self-pay

## 2023-06-19 ENCOUNTER — Other Ambulatory Visit (HOSPITAL_COMMUNITY): Payer: Self-pay

## 2023-06-19 ENCOUNTER — Other Ambulatory Visit (HOSPITAL_BASED_OUTPATIENT_CLINIC_OR_DEPARTMENT_OTHER): Payer: Self-pay

## 2023-06-19 ENCOUNTER — Encounter: Payer: Self-pay | Admitting: Pharmacist

## 2023-06-23 ENCOUNTER — Other Ambulatory Visit (HOSPITAL_BASED_OUTPATIENT_CLINIC_OR_DEPARTMENT_OTHER): Payer: Self-pay

## 2023-06-24 ENCOUNTER — Other Ambulatory Visit (HOSPITAL_BASED_OUTPATIENT_CLINIC_OR_DEPARTMENT_OTHER): Payer: Self-pay

## 2023-06-26 ENCOUNTER — Other Ambulatory Visit (HOSPITAL_BASED_OUTPATIENT_CLINIC_OR_DEPARTMENT_OTHER): Payer: Self-pay

## 2023-06-30 ENCOUNTER — Other Ambulatory Visit (HOSPITAL_BASED_OUTPATIENT_CLINIC_OR_DEPARTMENT_OTHER): Payer: Self-pay

## 2023-07-01 ENCOUNTER — Other Ambulatory Visit (HOSPITAL_BASED_OUTPATIENT_CLINIC_OR_DEPARTMENT_OTHER): Payer: Self-pay

## 2023-07-03 ENCOUNTER — Other Ambulatory Visit (HOSPITAL_BASED_OUTPATIENT_CLINIC_OR_DEPARTMENT_OTHER): Payer: Self-pay

## 2023-07-04 ENCOUNTER — Other Ambulatory Visit (HOSPITAL_BASED_OUTPATIENT_CLINIC_OR_DEPARTMENT_OTHER): Payer: Self-pay

## 2023-07-07 ENCOUNTER — Other Ambulatory Visit (HOSPITAL_BASED_OUTPATIENT_CLINIC_OR_DEPARTMENT_OTHER): Payer: Self-pay

## 2023-07-07 NOTE — Telephone Encounter (Signed)
Can we get a prior auth on Wegovy with patient's updated insurance? Thanks, BMS/CMA

## 2023-07-08 ENCOUNTER — Ambulatory Visit: Payer: Medicaid Other | Admitting: Family

## 2023-07-08 ENCOUNTER — Other Ambulatory Visit (HOSPITAL_BASED_OUTPATIENT_CLINIC_OR_DEPARTMENT_OTHER): Payer: Self-pay

## 2023-07-08 ENCOUNTER — Ambulatory Visit: Payer: Medicaid Other | Admitting: Family Medicine

## 2023-07-08 ENCOUNTER — Encounter: Payer: Self-pay | Admitting: Family Medicine

## 2023-07-08 VITALS — BP 126/78 | HR 90 | Temp 98.0°F | Ht 67.0 in | Wt 290.2 lb

## 2023-07-08 DIAGNOSIS — J4541 Moderate persistent asthma with (acute) exacerbation: Secondary | ICD-10-CM | POA: Diagnosis not present

## 2023-07-08 DIAGNOSIS — J069 Acute upper respiratory infection, unspecified: Secondary | ICD-10-CM | POA: Insufficient documentation

## 2023-07-08 LAB — POCT RAPID STREP A (OFFICE): Rapid Strep A Screen: NEGATIVE

## 2023-07-08 MED ORDER — PREDNISONE 20 MG PO TABS: 40.0000 mg | ORAL_TABLET | Freq: Every day | ORAL | 0 refills | Status: DC

## 2023-07-08 NOTE — Progress Notes (Signed)
[facility] [address] [place] [address] Dept: [phone] Dept Fax: [phone]  Office Visit  Subjective:    Patient ID: Michelle Kane, female    DOB: 13-Mar-1986, 37 y.o..   MRN: [id number]  Chief Complaint  Patient presents with   Sore Throat    C/o having ST, swollen glands on neck and voice x 3 days.  Has taken allergy medication and drinking hot tea.     History of Present Illness:  Patient is in today complaining of a 3-day history of sore throat and swollen glands in her right neck. She notes that the last two times she had a similar presentation, she tested positive for Strep. She admits that she is having nasal congestion, sinus pressure, and has a cough. She has not run fever. She has a history of asthma. She uses Breo Ellipta daily and has albuterol for PRN use. She notes she used this once earlier today.  Past Medical History: Patient Active Problem List   Diagnosis Date Noted   Fatigue 03/13/2023   History of hysterectomy 03/13/2023   Pruritus 11/05/2022   Pruritic rash 11/05/2022   Allergic conjunctivitis of both eyes 11/05/2022   Mixed hyperlipidemia 09/05/2022   COVID-19 03/26/2022   Flank pain 02/04/2022   Chronic midline low back pain without sciatica 01/31/2022   Stress incontinence 09/04/2021   Laryngitis 08/30/2021   Right lower quadrant abdominal pain 08/23/2021   Urticaria 12/02/2019   Seasonal and perennial allergic rhinoconjunctivitis 12/02/2019   Morbid obesity (HCC) 11/11/2016   Moderate persistent asthma without complication 06/27/2016   Anxiety state 12/19/2015   Essential hypertension 10/09/2015   CHF (congestive heart failure), NYHA class I, chronic, systolic (HCC) 10/09/2015   GERD (gastroesophageal reflux disease)    Past Surgical History:  Procedure Laterality Date   ABDOMINAL HYSTERECTOMY     CESAREAN SECTION  03-01-2008  and 2016   IM PINNING RIGHT ELBOW FX  1992    HARDWARE REMOVED   LAPAROSCOPIC ASSISTED VAGINAL HYSTERECTOMY Left 02/04/2017   Procedure: LAPAROSCOPIC ASSISTED VAGINAL HYSTERECTOMY, LEFT SALPINGOOPHORECTOMY;  Surgeon: Marcelle Overlie, MD;  Location: [facility];  Service: Gynecology;  Laterality: Left;  NEED BED   LAPAROSCOPY N/A 07/29/2014   Procedure: LAPAROSCOPY DIAGNOSTIC;  Surgeon: Jeani Hawking, MD;  Location: [facility];  Service: Gynecology;  Laterality: N/A;   OOPHORECTOMY Left 02/04/2017   Procedure: LEFT OOPHORECTOMY;  Surgeon: Marcelle Overlie, MD;  Location: [facility];  Service: Gynecology;  Laterality: Left;   Family History  Problem Relation Age of Onset   Asthma Mother    Crohn's disease Mother    Colon polyps Mother    Allergic rhinitis Mother    Heart disease Father        heart failure & Amyloidosis   Hyperlipidemia Father    Hemophilia Father    COPD Father        smoked   Urticaria Father    Allergic rhinitis Sister    Heart failure Brother    Allergic rhinitis Daughter    Hemophilia Son    Allergic rhinitis Son    Food Allergy Son    Heart failure Paternal Aunt    Heart failure Paternal Uncle    COPD Maternal Grandmother        smoked   Lung cancer Maternal Grandmother        smoked   Cancer Maternal Grandfather        skin  Heart attack Maternal Grandfather    Diabetes Paternal Grandfather    Stroke Paternal Grandfather    Asthma Niece    Hypertension Neg Hx    Eczema Neg Hx    Angioedema Neg Hx    Atopy Neg Hx    Immunodeficiency Neg Hx    Outpatient Medications Prior to Visit  Medication Sig Dispense Refill   albuterol (VENTOLIN HFA) 108 (90 Base) MCG/ACT inhaler PLEASE SEE ATTACHED FOR DETAILED DIRECTIONS 6.7 each 1   cyclobenzaprine (FLEXERIL) 10 MG tablet Take 10 mg by mouth every 8 (eight) hours as needed.     EPINEPHrine 0.3 mg/0.3 mL IJ SOAJ injection Inject 0.3 mg into the muscle as needed for anaphylaxis. 2 each 1   famotidine  (PEPCID) 20 MG tablet Take 1 tablet (20 mg total) by mouth 2 (two) times daily. 60 tablet 3   fexofenadine (ALLEGRA) 60 MG tablet Take 60 mg by mouth 2 (two) times daily.     fluticasone furoate-vilanterol (BREO ELLIPTA) 200-25 MCG/ACT AEPB Inhale 1 puff into the lungs daily. Rinse mouth after each use. 60 each 5   levocetirizine (XYZAL) 5 MG tablet Take 5 mg by mouth in the morning and at bedtime.     LORazepam (ATIVAN) 1 MG tablet Take 1 mg by mouth every 6 (six) hours as needed for anxiety.     montelukast (SINGULAIR) 10 MG tablet Take 1 tablet (10 mg total) by mouth at bedtime. 30 tablet 3   pantoprazole (PROTONIX) 40 MG tablet Take 1 tablet (40 mg total) by mouth daily. Take 30-60 min before first meal of the day 90 tablet 3   Semaglutide-Weight Management (WEGOVY) 0.25 MG/0.5ML SOAJ Inject 0.25 mg into the skin once a week. 2 mL 0   No facility-administered medications prior to visit.   Allergies  Allergen Reactions   Buspar [Buspirone] Other (See Comments)    Mouth, lips tingling, throat tightness, chest pain   Vicodin [Hydrocodone-Acetaminophen] Itching, Nausea And Vomiting and Other (See Comments)    GI pains also   Labetalol     Causes congestive heart failure   Zyrtec [Cetirizine]     Worsening anxiety     Objective:   Today's Vitals   07/08/23 1139  BP: 126/78  Pulse: 90  Temp: 98 F (36.7 C)  TempSrc: Temporal  SpO2: 98%  Weight: 290 lb 3.2 oz (131.6 kg)  Height: 5\' 7"  (1.702 m)   Body mass index is 45.45 kg/m.   General: Well developed, well nourished. No acute distress. HEENT: Normocephalic, non-traumatic. Conjunctiva clear. External ears normal. EAC and TMs normal   bilaterally. Nose clear without congestion or rhinorrhea. Mucous membranes moist. There is a mucousy exudate on   the right tonsil, but no swelling or redness. Oropharynx clear. Good dentition. Lungs: Diffuse expiratory wheezing. Psych: Alert and oriented. Normal mood and affect.  There are no  preventive care reminders to display for this patient.  Lab Results POC Rapid Strep: Neg.    Assessment & Plan:   Problem List Items Addressed This Visit       Respiratory   Viral URI with cough - Primary   Discussed home care for viral illness, including rest, pushing fluids, and OTC medications as needed for symptom relief. Recommend hot tea with honey for sore throat symptoms. Follow-up if needed for worsening or persistent symptoms.       Other Visit Diagnoses       Moderate persistent asthma with exacerbation  Recommend a steroid burst. She shoudl use her albuterol inhaler q 6 hrs for 48 hours, then resume PRN use.   Relevant Medications   predniSONE (DELTASONE) 20 MG tablet       Return if symptoms worsen or fail to improve.   Loyola Mast, MD

## 2023-07-08 NOTE — Assessment & Plan Note (Signed)
Discussed home care for viral illness, including rest, pushing fluids, and OTC medications as needed for symptom relief. Recommend hot tea with honey for sore throat symptoms. Follow-up if needed for worsening or persistent symptoms.  

## 2023-07-10 ENCOUNTER — Other Ambulatory Visit (HOSPITAL_BASED_OUTPATIENT_CLINIC_OR_DEPARTMENT_OTHER): Payer: Self-pay

## 2023-07-11 ENCOUNTER — Telehealth: Payer: Self-pay

## 2023-07-11 ENCOUNTER — Other Ambulatory Visit (HOSPITAL_BASED_OUTPATIENT_CLINIC_OR_DEPARTMENT_OTHER): Payer: Self-pay

## 2023-07-11 ENCOUNTER — Other Ambulatory Visit (HOSPITAL_COMMUNITY): Payer: Self-pay

## 2023-07-11 NOTE — Telephone Encounter (Signed)
PA approved and patient aware. Dm/cma

## 2023-07-11 NOTE — Telephone Encounter (Signed)
Pharmacy Patient Advocate Encounter   Received notification from Patient Advice Request messages that prior authorization for Wegovy 0.25mg /0.70ml is required/requested.   Insurance verification completed.   The patient is insured through [facility] .   Per test claim: PA required; PA submitted to above mentioned insurance via CoverMyMeds Key/confirmation #/EOC [id number] Status is pending

## 2023-07-11 NOTE — Telephone Encounter (Signed)
Pharmacy Patient Advocate Encounter  Received notification from [facility] that Prior Authorization for [facility] 0.25MG  has been APPROVED from 07/11/23 to 01/07/24   PA #/Case ID/Reference #: [id number]

## 2023-07-11 NOTE — Telephone Encounter (Signed)
Patient notified VIA phone. Dm/cma  

## 2023-07-14 ENCOUNTER — Other Ambulatory Visit (HOSPITAL_BASED_OUTPATIENT_CLINIC_OR_DEPARTMENT_OTHER): Payer: Self-pay

## 2023-07-14 ENCOUNTER — Other Ambulatory Visit: Payer: Self-pay

## 2023-08-09 ENCOUNTER — Other Ambulatory Visit: Payer: Self-pay | Admitting: Nurse Practitioner

## 2023-08-12 ENCOUNTER — Other Ambulatory Visit (HOSPITAL_BASED_OUTPATIENT_CLINIC_OR_DEPARTMENT_OTHER): Payer: Self-pay

## 2023-08-12 MED ORDER — WEGOVY 0.25 MG/0.5ML ~~LOC~~ SOAJ
0.2500 mg | SUBCUTANEOUS | 0 refills | Status: DC
  Filled 2023-08-12: qty 2, 28d supply, fill #0

## 2023-08-18 ENCOUNTER — Other Ambulatory Visit: Payer: Self-pay | Admitting: Allergy

## 2023-09-10 ENCOUNTER — Other Ambulatory Visit: Payer: Self-pay | Admitting: Nurse Practitioner

## 2023-09-10 ENCOUNTER — Other Ambulatory Visit (HOSPITAL_BASED_OUTPATIENT_CLINIC_OR_DEPARTMENT_OTHER): Payer: Self-pay

## 2023-09-10 MED ORDER — WEGOVY 0.25 MG/0.5ML ~~LOC~~ SOAJ
0.2500 mg | SUBCUTANEOUS | 0 refills | Status: DC
  Filled 2023-09-10: qty 2, 28d supply, fill #0

## 2023-09-10 NOTE — Telephone Encounter (Signed)
 Requesting: Semaglutide-Weight Management (WEGOVY) 0.25 MG/0.5ML SOAJ  Last Visit: 06/16/2023 Next Visit: 09/22/2023 Last Refill: 08/12/2023  Please Advise

## 2023-09-17 DIAGNOSIS — M25572 Pain in left ankle and joints of left foot: Secondary | ICD-10-CM | POA: Diagnosis not present

## 2023-09-22 ENCOUNTER — Other Ambulatory Visit (HOSPITAL_BASED_OUTPATIENT_CLINIC_OR_DEPARTMENT_OTHER): Payer: Self-pay

## 2023-09-22 ENCOUNTER — Encounter: Payer: Self-pay | Admitting: Nurse Practitioner

## 2023-09-22 ENCOUNTER — Ambulatory Visit (INDEPENDENT_AMBULATORY_CARE_PROVIDER_SITE_OTHER): Payer: Medicaid Other | Admitting: Nurse Practitioner

## 2023-09-22 VITALS — BP 116/82 | HR 90 | Temp 97.5°F | Ht 67.0 in | Wt 284.0 lb

## 2023-09-22 DIAGNOSIS — Z6841 Body Mass Index (BMI) 40.0 and over, adult: Secondary | ICD-10-CM | POA: Diagnosis not present

## 2023-09-22 DIAGNOSIS — E782 Mixed hyperlipidemia: Secondary | ICD-10-CM

## 2023-09-22 DIAGNOSIS — I10 Essential (primary) hypertension: Secondary | ICD-10-CM | POA: Diagnosis not present

## 2023-09-22 DIAGNOSIS — R109 Unspecified abdominal pain: Secondary | ICD-10-CM | POA: Diagnosis not present

## 2023-09-22 DIAGNOSIS — J4540 Moderate persistent asthma, uncomplicated: Secondary | ICD-10-CM

## 2023-09-22 DIAGNOSIS — F411 Generalized anxiety disorder: Secondary | ICD-10-CM | POA: Diagnosis not present

## 2023-09-22 DIAGNOSIS — Z0000 Encounter for general adult medical examination without abnormal findings: Secondary | ICD-10-CM | POA: Insufficient documentation

## 2023-09-22 DIAGNOSIS — M5442 Lumbago with sciatica, left side: Secondary | ICD-10-CM

## 2023-09-22 DIAGNOSIS — G8929 Other chronic pain: Secondary | ICD-10-CM

## 2023-09-22 DIAGNOSIS — I5022 Chronic systolic (congestive) heart failure: Secondary | ICD-10-CM

## 2023-09-22 DIAGNOSIS — K219 Gastro-esophageal reflux disease without esophagitis: Secondary | ICD-10-CM

## 2023-09-22 DIAGNOSIS — Z83719 Family history of colon polyps, unspecified: Secondary | ICD-10-CM | POA: Diagnosis not present

## 2023-09-22 DIAGNOSIS — Z860100 Personal history of colon polyps, unspecified: Secondary | ICD-10-CM | POA: Diagnosis not present

## 2023-09-22 LAB — CBC WITH DIFFERENTIAL/PLATELET
Basophils Absolute: 0 10*3/uL (ref 0.0–0.1)
Basophils Relative: 0.6 % (ref 0.0–3.0)
Eosinophils Absolute: 0.3 10*3/uL (ref 0.0–0.7)
Eosinophils Relative: 4.6 % (ref 0.0–5.0)
HCT: 41.7 % (ref 36.0–46.0)
Hemoglobin: 14 g/dL (ref 12.0–15.0)
Lymphocytes Relative: 31.4 % (ref 12.0–46.0)
Lymphs Abs: 2.3 10*3/uL (ref 0.7–4.0)
MCHC: 33.5 g/dL (ref 30.0–36.0)
MCV: 91.9 fl (ref 78.0–100.0)
Monocytes Absolute: 0.4 10*3/uL (ref 0.1–1.0)
Monocytes Relative: 5.8 % (ref 3.0–12.0)
Neutro Abs: 4.1 10*3/uL (ref 1.4–7.7)
Neutrophils Relative %: 57.6 % (ref 43.0–77.0)
Platelets: 273 10*3/uL (ref 150.0–400.0)
RBC: 4.54 Mil/uL (ref 3.87–5.11)
RDW: 13.1 % (ref 11.5–15.5)
WBC: 7.2 10*3/uL (ref 4.0–10.5)

## 2023-09-22 LAB — LIPID PANEL
Cholesterol: 207 mg/dL — ABNORMAL HIGH (ref 0–200)
HDL: 33.3 mg/dL — ABNORMAL LOW (ref >39.00)
LDL Cholesterol: 145 mg/dL — ABNORMAL HIGH (ref 0–99)
NonHDL: 173.35
Total CHOL/HDL Ratio: 6
Triglycerides: 141 mg/dL (ref 0.0–149.0)
VLDL: 28.2 mg/dL (ref 0.0–40.0)

## 2023-09-22 LAB — COMPREHENSIVE METABOLIC PANEL
ALT: 17 U/L (ref 0–35)
AST: 13 U/L (ref 0–37)
Albumin: 4.1 g/dL (ref 3.5–5.2)
Alkaline Phosphatase: 56 U/L (ref 39–117)
BUN: 9 mg/dL (ref 6–23)
CO2: 25 meq/L (ref 19–32)
Calcium: 9.1 mg/dL (ref 8.4–10.5)
Chloride: 105 meq/L (ref 96–112)
Creatinine, Ser: 0.66 mg/dL (ref 0.40–1.20)
GFR: 111.96 mL/min (ref >60.00)
Glucose, Bld: 106 mg/dL — ABNORMAL HIGH (ref 70–99)
Potassium: 3.6 meq/L (ref 3.5–5.1)
Sodium: 139 meq/L (ref 135–145)
Total Bilirubin: 0.5 mg/dL (ref 0.2–1.2)
Total Protein: 6.6 g/dL (ref 6.0–8.3)

## 2023-09-22 LAB — HEMOGLOBIN A1C: Hgb A1c MFr Bld: 5 % (ref 4.6–6.5)

## 2023-09-22 MED ORDER — WEGOVY 0.5 MG/0.5ML ~~LOC~~ SOAJ
0.5000 mg | SUBCUTANEOUS | 1 refills | Status: DC
  Filled 2023-09-22 – 2023-10-03 (×2): qty 2, 28d supply, fill #0
  Filled 2023-10-26: qty 2, 28d supply, fill #1

## 2023-09-22 MED ORDER — PREGABALIN 75 MG PO CAPS: 75.0000 mg | ORAL_CAPSULE | Freq: Every day | ORAL | 1 refills | Status: DC

## 2023-09-22 MED ORDER — PANTOPRAZOLE SODIUM 40 MG PO TBEC: 40.0000 mg | DELAYED_RELEASE_TABLET | Freq: Every day | ORAL | 3 refills | Status: AC

## 2023-09-22 MED ORDER — FAMOTIDINE 20 MG PO TABS
20.0000 mg | ORAL_TABLET | Freq: Two times a day (BID) | ORAL | 3 refills | Status: AC
Start: 2023-09-22 — End: ?

## 2023-09-22 NOTE — Assessment & Plan Note (Signed)
 Chronic, stable. We will refill the Protonix 40mg  daily and pepcid 20mg  BID.

## 2023-09-22 NOTE — Assessment & Plan Note (Signed)
 Chronic, stable.  She is still following with her psychiatrist and is taking ativan 1 mg every 6 hours as needed.  Continue collaboration's recommendation from specialist.

## 2023-09-22 NOTE — Assessment & Plan Note (Signed)
Health maintenance reviewed and updated. Discussed nutrition, exercise. Follow-up 1 year.

## 2023-09-22 NOTE — Assessment & Plan Note (Signed)
Occurred during her pregnancy. Last EF was 50-55% on 01/12/18. She is euvolemic on exam. She limits the amount of fluids she drinks during the day. She is currently not on any medications. Follow up with any concerns.  

## 2023-09-22 NOTE — Assessment & Plan Note (Addendum)
 Chronic, stable. This is currently controlled without medication. Check CMP, CBC, lipid panel today.

## 2023-09-22 NOTE — Assessment & Plan Note (Signed)
 Chronic, not controlled. Insurance denied further injections and she has a meeting for appeal this Thursday. She has tried cymbalta and gabapentin in the past, however they did not help. Will start lyrica 75mg  daily. PDMP reviewed. Follow-up in 3 months or sooner with concerns.

## 2023-09-22 NOTE — Assessment & Plan Note (Signed)
 BMI 44.4. She has lost 6 pounds since her last visit. Congratulated her on this! Will increase wegovy to 0.5mg  injection weekly. Continue focus on nutrition and exercise. Check A1c today.

## 2023-09-22 NOTE — Assessment & Plan Note (Signed)
Will check lipid panel today and treat based on results. 

## 2023-09-22 NOTE — Progress Notes (Signed)
 BP 116/82 (BP Location: Left Arm, Patient Position: Sitting, Cuff Size: Large)   Pulse 90   Temp (!) 97.5 F (36.4 C)   Ht 5\' 7"  (1.702 m)   Wt 284 lb (128.8 kg)   LMP 12/09/2016   SpO2 97%   BMI 44.48 kg/m    Subjective:    Patient ID: Michelle Kane, female    DOB: 03-Feb-1986, 38 y.o.   MRN: [id number]  CC: Chief Complaint  Patient presents with   Annual Exam    With fasting lab work, Rx Refills    HPI: Michelle Kane is a 38 y.o. female presenting on 09/22/2023 for comprehensive medical examination. Current medical complaints include: ongoing back pain radiating down left leg  She is having ongoing low back pain that radiates down her left leg and to her right hip. She is following with neurosurgery and was getting an injection that helped, however she has an appeal to get this approved on Thursday. The last injection was due in December and was not able to get due to insurance and now her pain is worsening. She has pain with sitting for long times and with certain movements. Laying down can help the pain. She has tried gabapentin and cymbalta in the past which did not help.   She currently lives with: husband, kids, grandbaby Menopausal Symptoms: no  Depression and Anxiety Screen done today and results listed below:     09/22/2023    8:44 AM 06/16/2023    8:40 AM 09/05/2022   10:09 AM 09/04/2021    7:17 PM 08/30/2021   11:15 AM  Depression screen PHQ 2/9  Decreased Interest 0 0 2 0 0  Down, Depressed, Hopeless 0 0 0 0 0  PHQ - 2 Score 0 0 2 0 0  Altered sleeping 3 3 3     Tired, decreased energy 0 1 2    Change in appetite 3 3 1     Feeling bad or failure about yourself  0 0 0    Trouble concentrating 0 0 0    Moving slowly or fidgety/restless 2 3 0    Suicidal thoughts 0 0 0    PHQ-9 Score 8 10 8     Difficult doing work/chores   Somewhat difficult        09/22/2023    8:44 AM 06/16/2023    8:41 AM 09/05/2022   10:10 AM 08/21/2020   11:59 AM  GAD 7 : Generalized  Anxiety Score  Nervous, Anxious, on Edge 0 3 3 1   Control/stop worrying 0 0 1 1  Worry too much - different things 0 2 1 1   Trouble relaxing 0 1 3 1   Restless 0 0 2 1  Easily annoyed or irritable 0 2 3 1   Afraid - awful might happen 0 0 1 1  Total GAD 7 Score 0 8 14 7   Anxiety Difficulty  Somewhat difficult Somewhat difficult Somewhat difficult    The patient has a history of falls. I did complete a risk assessment for falls. A plan of care for falls was documented.   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 EF 40-45% in 2016, varying by several echoes. F/u echo 06/2015 showed EF 50%.    PONV (postoperative nausea and vomiting)    Sinus tachycardia    a. 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 b. 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    Surgical History:  Past Surgical History:  Procedure Laterality Date   ABDOMINAL HYSTERECTOMY     CESAREAN SECTION  03-01-2008  and 2016   IM PINNING RIGHT ELBOW FX  1992   HARDWARE REMOVED   LAPAROSCOPIC ASSISTED VAGINAL HYSTERECTOMY Left 02/04/2017   Procedure: LAPAROSCOPIC ASSISTED VAGINAL HYSTERECTOMY, LEFT SALPINGOOPHORECTOMY;  Surgeon: Marcelle Overlie, MD;  Location: [facility];  Service: Gynecology;  Laterality: Left;  NEED BED   LAPAROSCOPY N/A 07/29/2014   Procedure: LAPAROSCOPY DIAGNOSTIC;  Surgeon: Jeani Hawking, MD;  Location[facility];  Service: Gynecology;  Laterality: N/A;   OOPHORECTOMY  Left 02/04/2017   Procedure: LEFT OOPHORECTOMY;  Surgeon: Marcelle Overlie, MD;  Location: [facility];  Service: Gynecology;  Laterality: Left;    Medications:  Current Outpatient Medications on File Prior to Visit  Medication Sig   albuterol (VENTOLIN HFA) 108 (90 Base) MCG/ACT inhaler PLEASE SEE ATTACHED FOR DETAILED DIRECTIONS   cyclobenzaprine (FLEXERIL) 10 MG tablet Take 10 mg by mouth every 8 (eight) hours as needed.   EPINEPHrine 0.3 mg/0.3 mL IJ SOAJ injection Inject 0.3 mg into the muscle as needed for anaphylaxis.   fexofenadine (ALLEGRA) 60 MG tablet Take 60 mg by mouth 2 (two) times daily.   fluticasone furoate-vilanterol (BREO ELLIPTA) 200-25 MCG/ACT AEPB Inhale 1 puff into the lungs daily. Rinse mouth after each use.   levocetirizine (XYZAL) 5 MG tablet Take 5 mg by mouth in the morning and at bedtime.   LORazepam (ATIVAN) 1 MG tablet Take 1 mg by mouth every 6 (six) hours as needed for anxiety.   montelukast (SINGULAIR) 10 MG tablet Take 1 tablet (10 mg total) by mouth at bedtime.   No current facility-administered medications on file prior to visit.    Allergies:  Allergies  Allergen Reactions   Buspar [Buspirone] Other (See Comments)    Mouth, lips tingling, throat tightness, chest pain   Vicodin [Hydrocodone-Acetaminophen] Itching, Nausea And Vomiting and Other (See Comments)    GI pains also   Labetalol     Causes congestive heart failure   Zyrtec [Cetirizine]     Worsening anxiety    Social History:  Social History   Socioeconomic History   Marital status: Married    Spouse name: Not on file   Number of children: Not on file   Years of education: Not on file   Highest education level: Not on file  Occupational History   Occupation: CNA   Tobacco Use   Smoking status: Former    Current packs/day: 0.50    Average packs/day: 0.5 packs/day for 10.0 years (5.0 ttl pk-yrs)    Types: Cigarettes   Smokeless tobacco: Never  Vaping Use    Vaping status: Some Days  Substance and Sexual Activity   Alcohol use: Not Currently   Drug use: No   Sexual activity: Yes    Birth control/protection: Surgical  Other Topics Concern   Not  on file  Social History Narrative   Not on file   Social Drivers of Health   Financial Resource Strain: Not on file  Food Insecurity: Not on file  Transportation Needs: Not on file  Physical Activity: Not on file  Stress: Not on file  Social Connections: Not on file  Intimate Partner Violence: Not on file   Social History   Tobacco Use  Smoking Status Former   Current packs/day: 0.50   Average packs/day: 0.5 packs/day for 10.0 years (5.0 ttl pk-yrs)   Types: Cigarettes  Smokeless Tobacco Never   Social History   Substance and Sexual Activity  Alcohol Use Not Currently    Family History:  Family History  Problem Relation Age of Onset   Asthma Mother    Crohn's disease Mother    Colon polyps Mother    Allergic rhinitis Mother    Heart disease Father        heart failure & Amyloidosis   Hyperlipidemia Father    Hemophilia Father    COPD Father        smoked   Urticaria Father    Allergic rhinitis Sister    Heart failure Brother    Allergic rhinitis Daughter    Hemophilia Son    Allergic rhinitis Son    Food Allergy Son    Heart failure Paternal Aunt    Heart failure Paternal Uncle    COPD Maternal Grandmother        smoked   Lung cancer Maternal Grandmother        smoked   Cancer Maternal Grandfather        skin   Heart attack Maternal Grandfather    Diabetes Paternal Grandfather    Stroke Paternal Grandfather    Asthma Niece    Hypertension Neg Hx    Eczema Neg Hx    Angioedema Neg Hx    Atopy Neg Hx    Immunodeficiency Neg Hx     Past medical history, surgical history, medications, allergies, family history and social history reviewed with patient today and changes made to appropriate areas of the chart.   Review of Systems  Constitutional:  Positive for  malaise/fatigue. Negative for fever.  HENT: Negative.    Eyes: Negative.   Respiratory: Negative.    Cardiovascular: Negative.   Gastrointestinal: Negative.   Genitourinary: Negative.   Musculoskeletal:  Positive for back pain.  Skin: Negative.   Neurological: Negative.   Psychiatric/Behavioral:  Negative for depression. The patient has insomnia.    All other ROS negative except what is listed above and in the HPI.      Objective:    BP 116/82 (BP Location: Left Arm, Patient Position: Sitting, Cuff Size: Large)   Pulse 90   Temp (!) 97.5 F (36.4 C)   Ht 5\' 7"  (1.702 m)   Wt 284 lb (128.8 kg)   LMP 12/09/2016   SpO2 97%   BMI 44.48 kg/m   Wt Readings from Last 3 Encounters:  09/22/23 284 lb (128.8 kg)  07/08/23 290 lb 3.2 oz (131.6 kg)  06/16/23 287 lb (130.2 kg)    Physical Exam Vitals and nursing note reviewed.  Constitutional:      General: She is not in acute distress.    Appearance: Normal appearance.  HENT:     Head: Normocephalic and atraumatic.     Right Ear: Tympanic membrane, ear canal and external ear normal.     Left Ear: Tympanic membrane, ear canal and external  ear normal.  Eyes:     Conjunctiva/sclera: Conjunctivae normal.  Cardiovascular:     Rate and Rhythm: Normal rate and regular rhythm.     Pulses: Normal pulses.     Heart sounds: Normal heart sounds.  Pulmonary:     Effort: Pulmonary effort is normal.     Breath sounds: Normal breath sounds.  Abdominal:     Palpations: Abdomen is soft.     Tenderness: There is no abdominal tenderness.  Musculoskeletal:        General: No tenderness.     Cervical back: Normal range of motion and neck supple.     Right lower leg: No edema.     Left lower leg: No edema.     Comments: Limited lumbar ROM due to pain  Lymphadenopathy:     Cervical: No cervical adenopathy.  Skin:    General: Skin is warm and dry.  Neurological:     General: No focal deficit present.     Mental Status: She is alert and  oriented to person, place, and time.     Cranial Nerves: No cranial nerve deficit.     Coordination: Coordination normal.     Gait: Gait normal.  Psychiatric:        Mood and Affect: Mood normal.        Behavior: Behavior normal.        Thought Content: Thought content normal.        Judgment: Judgment normal.     Results for orders placed or performed in visit on 07/08/23  POCT rapid strep A   Collection Time: 07/08/23 12:14 PM  Result Value Ref Range   Rapid Strep A Screen Negative Negative      Assessment & Plan:   Problem List Items Addressed This Visit       Cardiovascular and Mediastinum   Essential hypertension - Primary   Chronic, stable. This is currently controlled without medication. Check CMP, CBC, lipid panel today.       Relevant Orders   CBC with Differential/Platelet   Comprehensive metabolic panel   Lipid panel   CHF (congestive heart failure), NYHA class I, chronic, systolic (HCC)   Occurred during her pregnancy. Last EF was 50-55% on 01/12/18. She is euvolemic on exam. She limits the amount of fluids she drinks during the day. She is currently not on any medications. Follow up with any concerns.         Respiratory   RESOLVED: Moderate persistent asthma without complication     Digestive   GERD (gastroesophageal reflux disease)   Chronic, stable. We will refill the Protonix 40mg  daily and pepcid 20mg  BID.       Relevant Medications   pantoprazole (PROTONIX) 40 MG tablet   famotidine (PEPCID) 20 MG tablet     Other   Anxiety state   Chronic, stable.  She is still following with her psychiatrist and is taking ativan 1 mg every 6 hours as needed.  Continue collaboration's recommendation from specialist.      Morbid obesity (HCC)   BMI 44.4. She has lost 6 pounds since her last visit. Congratulated her on this! Will increase wegovy to 0.5mg  injection weekly. Continue focus on nutrition and exercise. Check A1c today.       Relevant Medications    Semaglutide-Weight Management (WEGOVY) 0.5 MG/0.5ML SOAJ   Other Relevant Orders   Hemoglobin A1c   Chronic back pain   Chronic, not controlled. Insurance denied further injections and she  has a meeting for appeal this Thursday. She has tried cymbalta and gabapentin in the past, however they did not help. Will start lyrica 75mg  daily. PDMP reviewed. Follow-up in 3 months or sooner with concerns.       Relevant Medications   pregabalin (LYRICA) 75 MG capsule   Mixed hyperlipidemia   Will check lipid panel today and treat based on results.      Relevant Orders   CBC with Differential/Platelet   Comprehensive metabolic panel   Lipid panel   Routine general medical examination at a health care facility   Health maintenance reviewed and updated. Discussed nutrition, exercise. Follow-up 1 year.          Follow up plan: Return in about 3 months (around 12/23/2023) for weight, back pain.   LABORATORY TESTING:  - Pap smear: not applicable  IMMUNIZATIONS:   - Tdap: Tetanus vaccination status reviewed: last tetanus booster within 10 years. - Influenza: Declined - Pneumovax: Not applicable - Prevnar: Declined - HPV: Not applicable - Shingrix vaccine: Not applicable  SCREENING: -Mammogram: Not applicable  - Colonoscopy: Not applicable  - Bone Density: Not applicable   PATIENT COUNSELING:   Advised to take 1 mg of folate supplement per day if capable of pregnancy.   Sexuality: Discussed sexually transmitted diseases, partner selection, use of condoms, avoidance of unintended pregnancy  and contraceptive alternatives.   Advised to avoid cigarette smoking.  I discussed with the patient that most people either abstain from alcohol or drink within safe limits (<=14/week and <=4 drinks/occasion for males, <=7/weeks and <= 3 drinks/occasion for females) and that the risk for alcohol disorders and other health effects rises proportionally with the number of drinks per week and how often  a drinker exceeds daily limits.  Discussed cessation/primary prevention of drug use and availability of treatment for abuse.   Diet: Encouraged to adjust caloric intake to maintain  or achieve ideal body weight, to reduce intake of dietary saturated fat and total fat, to limit sodium intake by avoiding high sodium foods and not adding table salt, and to maintain adequate dietary potassium and calcium preferably from fresh fruits, vegetables, and low-fat dairy products.    stressed the importance of regular exercise  Injury prevention: Discussed safety belts, safety helmets, smoke detector, smoking near bedding or upholstery.   Dental health: Discussed importance of regular tooth brushing, flossing, and dental visits.    NEXT PREVENTATIVE PHYSICAL DUE IN 1 YEAR. Return in about 3 months (around 12/23/2023) for weight, back pain.  Beatrice Ziehm A Meloni Hinz

## 2023-09-22 NOTE — Patient Instructions (Addendum)
 It was great to see you!  Increase your wegovy to 0.5mg  injection weekly  Start lyrica 1 capsule daily for your back pain  We are checking your labs today and will let you know the results via mychart/phone.   Let's follow-up in 3 months, sooner if you have concerns.  If a referral was placed today, you will be contacted for an appointment. Please note that routine referrals can sometimes take up to 3-4 weeks to process. Please call our office if you haven't heard anything after this time frame.  Take care,  Rodman Pickle, NP

## 2023-09-25 DIAGNOSIS — K838 Other specified diseases of biliary tract: Secondary | ICD-10-CM | POA: Diagnosis not present

## 2023-09-25 DIAGNOSIS — R109 Unspecified abdominal pain: Secondary | ICD-10-CM | POA: Diagnosis not present

## 2023-09-25 DIAGNOSIS — R161 Splenomegaly, not elsewhere classified: Secondary | ICD-10-CM | POA: Diagnosis not present

## 2023-09-25 DIAGNOSIS — I517 Cardiomegaly: Secondary | ICD-10-CM | POA: Diagnosis not present

## 2023-09-26 DIAGNOSIS — M25572 Pain in left ankle and joints of left foot: Secondary | ICD-10-CM | POA: Diagnosis not present

## 2023-10-03 ENCOUNTER — Other Ambulatory Visit (HOSPITAL_BASED_OUTPATIENT_CLINIC_OR_DEPARTMENT_OTHER): Payer: Self-pay

## 2023-10-03 DIAGNOSIS — M25572 Pain in left ankle and joints of left foot: Secondary | ICD-10-CM | POA: Diagnosis not present

## 2023-10-03 DIAGNOSIS — M7661 Achilles tendinitis, right leg: Secondary | ICD-10-CM | POA: Diagnosis not present

## 2023-10-08 DIAGNOSIS — M5416 Radiculopathy, lumbar region: Secondary | ICD-10-CM | POA: Diagnosis not present

## 2023-10-28 ENCOUNTER — Other Ambulatory Visit: Payer: Self-pay

## 2023-10-28 ENCOUNTER — Ambulatory Visit: Attending: Orthopaedic Surgery | Admitting: Physical Therapy

## 2023-10-28 DIAGNOSIS — M25672 Stiffness of left ankle, not elsewhere classified: Secondary | ICD-10-CM | POA: Insufficient documentation

## 2023-10-28 DIAGNOSIS — M25572 Pain in left ankle and joints of left foot: Secondary | ICD-10-CM | POA: Diagnosis not present

## 2023-10-28 DIAGNOSIS — M6281 Muscle weakness (generalized): Secondary | ICD-10-CM | POA: Insufficient documentation

## 2023-10-28 DIAGNOSIS — R262 Difficulty in walking, not elsewhere classified: Secondary | ICD-10-CM | POA: Insufficient documentation

## 2023-10-28 NOTE — Therapy (Addendum)
 OUTPATIENT PHYSICAL THERAPY LOWER EXTREMITY EVALUATION   Patient Name: Michelle Kane MRN: [id number] DOB:1986/02/23, 38 y.o., female Today's Date: 10/28/2023  END OF SESSION:  PT End of Session - 10/28/23 1617     Visit Number 1    Date for PT Re-Evaluation 12/23/23    Authorization Type [employer]    Authorization Time Period AUTH pending    Authorization - Visit Number 1    PT Start Time 1617    PT Stop Time 1715    PT Time Calculation (min) 58 min    Activity Tolerance Patient limited by pain;Patient tolerated treatment well    Behavior During Therapy WFL for tasks assessed/performed             Past Medical History:  Diagnosis Date   Allergy-induced asthma    Allergy induced asthma   Anxiety    Bronchitis    Cancer (HCC)    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 EF 40-45% in 2016, varying by several echoes. F/u echo 06/2015 showed EF 50%.    PONV (postoperative nausea and vomiting)    Sinus tachycardia    a. 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 b. 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   Past Surgical History:  Procedure Laterality Date   ABDOMINAL HYSTERECTOMY     CESAREAN SECTION  03-01-2008  and 2016   IM PINNING RIGHT ELBOW FX  1992   HARDWARE REMOVED   LAPAROSCOPIC ASSISTED VAGINAL HYSTERECTOMY Left 02/04/2017   Procedure: LAPAROSCOPIC ASSISTED VAGINAL HYSTERECTOMY, LEFT SALPINGOOPHORECTOMY;  Surgeon: Marcelle Overlie, MD;  Location: [facility];  Service: Gynecology;  Laterality: Left;  NEED BED   LAPAROSCOPY N/A 07/29/2014   Procedure: LAPAROSCOPY DIAGNOSTIC;  Surgeon:  Jeani Hawking, MD;  Location: [facility];  Service: Gynecology;  Laterality: N/A;   OOPHORECTOMY Left 02/04/2017   Procedure: LEFT OOPHORECTOMY;  Surgeon: Marcelle Overlie, MD;  Location: [facility];  Service: Gynecology;  Laterality: Left;   Patient Active Problem List   Diagnosis Date Noted   Routine general medical examination at a health care facility 09/22/2023   Viral URI with cough 07/08/2023   Fatigue 03/13/2023   History of hysterectomy 03/13/2023   Pruritus 11/05/2022   Pruritic rash 11/05/2022   Allergic conjunctivitis of both eyes 11/05/2022   Mixed hyperlipidemia 09/05/2022   COVID-19 03/26/2022   Flank pain 02/04/2022   Chronic back pain 01/31/2022   Stress incontinence 09/04/2021   Laryngitis 08/30/2021   Right lower quadrant abdominal pain 08/23/2021   Urticaria 12/02/2019   Seasonal and perennial allergic rhinoconjunctivitis 12/02/2019   Morbid obesity (HCC) 11/11/2016   Anxiety state 12/19/2015   Essential hypertension 10/09/2015   CHF (congestive heart failure), NYHA class I, chronic, systolic (HCC) 10/09/2015   GERD (gastroesophageal reflux disease)     PCP: Gerre Scull, NP  REFERRING PROVIDER: Terance Hart, MD  REFERRING DIAG: [id number] (ICD-10-CM) - Left Achilles tendinitis   THERAPY DIAG:  Stiffness of left ankle, not elsewhere classified  Pain in left ankle and joints  of left foot  Muscle weakness (generalized)  Difficulty in walking, not elsewhere classified  RATIONALE FOR EVALUATION AND TREATMENT: Rehabilitation  ONSET DATE: ~ About 8 months ago  NEXT MD VISIT:  11/17/23   SUBJECTIVE:   SUBJECTIVE STATEMENT:  A year ago, doing yard work and fell into a hole that she didn't see. Heard a pop and went to the doctor. Now feels pain on the side and top of foot. Doctor wants her to try DN and may need to replace tendon with another tendon from somewhere else. Also has back issues, bulging  disc.  PERTINENT HISTORY:  GERD, Anxiety, CHF, sinus tachycardia, bulging disc w/ sciatica  PAIN:  Are you having pain? Yes: NPRS scale: 6.5/10 currently, 10/10 when standing on it and feels like it gives out Pain location: L Achilles, heel, and dorsum of foot Pain description: sharp,achy when sitting or sharp when standing Aggravating factors: Standing, dangling foot, sitting, walking, stairs, basic activity and depends on day Relieving factors: Hasn't found anything to relieve, rests  PRECAUTIONS: None  RED FLAGS: None   WEIGHT BEARING RESTRICTIONS: No  FALLS:  Has patient fallen in last 6 months? 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  DIAGNOSTIC FINDINGS:  N/A  PATIENT SURVEYS:  LEFS 57 / 80 = 71.3 %  COGNITION: Overall cognitive status: Within functional limits for tasks assessed     SENSATION: WFL  EDEMA:  Figure 8: R = 56 cm, L = 55 cm (in ankle brace prior to measurement); swelling around medial & lateral malleoli   MUSCLE LENGTH: Hamstrings: mod/severe tight L (limited by LBP), mild tight R ITB: Mild tightness Piriformis:  Hip flexors: Mild tightness Quads:  Heelcord: Mod tight  POSTURE: rounded shoulders, forward head, and flexed trunk   PALPATION: TTP over plantar surface of heel through Achilles tendon up to musculotendinous junction at gastroc  LOWER EXTREMITY ROM:  A/PROM Right eval Left eval  Ankle dorsiflexion 0 /  6 0 p! / 4 p  Ankle plantarflexion 60 55 p!  Ankle inversion 28 32 ^  Ankle eversion 22 12 ^   (Blank rows = not tested, p = dull pain, p! = sharp pain, ^ = stretch)  LOWER EXTREMITY MMT:  MMT Right eval Left eval  Hip flexion 4 4-  Hip extension 4+ 4-  Hip abduction 4 4-  Hip adduction 4+ 4-  Hip internal rotation 3+ 3+  Hip external rotation 3+ 3+  Knee flexion    Knee extension    Ankle dorsiflexion 5 4 p!  Ankle plantarflexion    Ankle inversion 5 4  Ankle eversion 5 4   (Blank rows = not tested, p! = dull pain)  TREATMENT DATE:    10/28/23 THERAPEUTIC EXERCISE: To improve endurance, ROM, and flexibility.  Demonstration, verbal and tactile cues throughout for technique. Seated Calf Stretch w/ strap Seated soleus stretch w/ strap  PATIENT EDUCATION:  Education details: anticipated POC, initial HEP Person educated: Patient Education method: Programmer, multimedia, Demonstration, Verbal cues, and Handouts Education comprehension: verbalized understanding, returned demonstration, verbal cues required, and needs further education  HOME EXERCISE PROGRAM: Access Code: [email or web address] URL: [email or web address] Date: 10/28/2023 Prepared by: Markell Sciascia Joseph-Greene  Exercises - Seated Calf Stretch with Strap (Mirrored)  - 1 x daily - 7 x weekly - 3 reps - 30 sec hold - Seated Soleus Stretch with Strap  - 1 x daily - 7 x weekly - 3 reps - 30 sec hold  ASSESSMENT:  CLINICAL IMPRESSION: Stephaney is a 37 y.o. female who was seen today for physical therapy evaluation and treatment for L Achilles Tendinitis. Reported that her L achilles pain started about 8 months after a fall in the yard. Kelsi has show limitation and impaired muscle tension in LE  due to ankle and back pain. She has a bulging disc with sciatica that radiates down her L leg, tending to favor her R  side to alleviate the pain. She demonstrates R weight shift to avoid putting pressure on L ankle and due to her L sciatica. She also has clicking in her L anterior tib. Movement during the exam increased her pain in the back with shooting pain down her leg. The pre-existing condition and the L achilles tendinitis impacts her ability to walk, play with her kids and interferes with ADLs. Her goal is to get back to walking and have decreased pain in her L ankle. Kimani will benefit from skilled PT to address above deficits to improve mobility, activity tolerance, quality of life with decreased pain interference.  OBJECTIVE IMPAIRMENTS: Abnormal gait, decreased activity tolerance, decreased balance, decreased endurance, decreased knowledge of condition, decreased mobility, difficulty walking, decreased ROM, decreased strength, increased edema, increased fascial restrictions, impaired perceived functional ability, increased muscle spasms, impaired flexibility, impaired sensation, impaired tone, improper body mechanics, postural dysfunction, and pain.   ACTIVITY LIMITATIONS: carrying, lifting, sitting, standing, squatting, stairs, bed mobility, locomotion level, and caring for others  PARTICIPATION LIMITATIONS: meal prep, cleaning, laundry, interpersonal relationship, driving, community activity, occupation, and yard work  PERSONAL FACTORS: Past/current experiences, Time since onset of injury/illness/exacerbation, and 3+ comorbidities:  bulging disc w/ sciatica, GERD, Anxiety, CHF, sinus tachycardia  are also affecting patient's functional outcome.   REHAB POTENTIAL: Good  CLINICAL DECISION MAKING: Evolving/moderate complexity  EVALUATION COMPLEXITY: Moderate   GOALS: Goals reviewed with patient? Yes  SHORT TERM GOALS: Target date: 11/25/2023  1.  Pt will be independent with initial HEP Baseline:  Goal status: INITIAL  2.  Patient will report >/= 61/80 on LEFS (MCID = 9 pts) to demonstrate  improved functional ability.  Baseline: 57 / 80 = 71.3 % Goal status: INITIAL  3.  Pt will report 25% improvement in L achilles pain to improve QoL Baseline: 6.5/10 currently, 10/10 when standing Goal status: INITIAL  4.  Pt will improve 5 of B ankle DF for ease of mobility Baseline: Refer to ROM table Goal status: INITIAL    LONG TERM GOALS: Target date: 12/23/2023   1.  Pt will report 50-75% improvement in L achilles pain Baseline: 6.5/10 currently, 10/10 when standing Goal status: INITIAL   2.  Pt will improve B LE strength to >=4+/5 for improved stability and ease of mobility Baseline:  Refer to MMT table Goal status: INITIAL   3.  Patient will report >/= 66/80 on LEFS (MCID = 9 pts) to demonstrate improved functional ability.  Baseline: 57 / 80 = 71.3 % Goal status: INITIAL  4.  Patient will demonstrate improved R ankle DF AROM to WNL to allow for normal gait and stair mechanics.  Baseline: Refer to ROM table Goal status: INITIAL  5.  Patient will be independent with advanced/ongoing HEP to improve outcomes and carryover.  Baseline:  Goal status: INITIAL  6.  Pt will be able to stand and sit for 1 hour without increased L heel pain for increase performance for housework Baseline: Unable to sit or stand for 1 hour Goal status: INITIAL  7.  Pt will be able to ambulate 600" with normalized gait pattern for safe community mobility Baseline: Mod difficulty to walk - LEFS Goal status: INITIAL    PLAN:  PT FREQUENCY: 2x/week  PT DURATION: 8 weeks  PLANNED INTERVENTIONS: 97164- PT Re-evaluation, 97750- Physical Performance Testing, 97110-Therapeutic exercises, 97530- Therapeutic activity, [id number]- Neuromuscular re-education, 97535- Self Care, 16109- Manual therapy, [phone]- Gait training, [phone]- Aquatic Therapy, [phone]- Ultrasound, Patient/Family education, Balance training, Stair training, Taping, Dry Needling, Joint mobilization, Cryotherapy, and Moist heat  PLAN FOR  NEXT SESSION: Review initial HEP, MT +/- TPDN to gastroc, soleus; progress ankle and foot intrinsic strengthening; progress proximal LE strengthening; initiate balance and proprioceptive training; taping to offload achilles    Merrie Epler Joseph-Greene, Student-PT 10/28/2023, 6:10 PM

## 2023-10-30 ENCOUNTER — Ambulatory Visit

## 2023-10-30 ENCOUNTER — Other Ambulatory Visit: Payer: Self-pay

## 2023-10-30 DIAGNOSIS — M25572 Pain in left ankle and joints of left foot: Secondary | ICD-10-CM | POA: Diagnosis not present

## 2023-10-30 DIAGNOSIS — M25672 Stiffness of left ankle, not elsewhere classified: Secondary | ICD-10-CM | POA: Diagnosis not present

## 2023-10-30 DIAGNOSIS — R262 Difficulty in walking, not elsewhere classified: Secondary | ICD-10-CM | POA: Diagnosis not present

## 2023-10-30 DIAGNOSIS — M6281 Muscle weakness (generalized): Secondary | ICD-10-CM

## 2023-10-30 NOTE — Therapy (Signed)
 OUTPATIENT PHYSICAL THERAPY LOWER EXTREMITY TREATMENT   Patient Name: Michelle Kane MRN: [id number] DOB:[birth year], 38 y.o., female Today's Date: 10/30/2023  END OF SESSION:  PT End of Session - 10/30/23 1638     Visit Number 2    Date for PT Re-Evaluation 12/23/23    Authorization Type [employer]    Authorization Time Period AUTH pending    Progress Note Due on Visit 10    PT Start Time 1100    PT Stop Time 1145    PT Time Calculation (min) 45 min    Activity Tolerance Patient limited by pain;Patient tolerated treatment well    Behavior During Therapy WFL for tasks assessed/performed              Past Medical History:  Diagnosis Date   Allergy-induced asthma    Allergy induced asthma   Anxiety    Bronchitis    Cancer (HCC)    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 EF 40-45% in 2016, varying by several echoes. F/u echo 06/2015 showed EF 50%.    PONV (postoperative nausea and vomiting)    Sinus tachycardia    a. 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 b. 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   Past Surgical History:  Procedure Laterality Date   ABDOMINAL HYSTERECTOMY     CESAREAN SECTION  03-01-2008  and 2016   IM PINNING RIGHT ELBOW FX  1992   HARDWARE REMOVED   LAPAROSCOPIC ASSISTED VAGINAL HYSTERECTOMY Left 02/04/2017   Procedure: LAPAROSCOPIC ASSISTED VAGINAL HYSTERECTOMY, LEFT SALPINGOOPHORECTOMY;  Surgeon: Thurman Flores, MD;  Location: [facility] [place];  Service: Gynecology;  Laterality: Left;  NEED BED   LAPAROSCOPY N/A 07/29/2014   Procedure: LAPAROSCOPY DIAGNOSTIC;  Surgeon:  Martine Sleek, MD;  Location: [facility];  Service: Gynecology;  Laterality: N/A;   OOPHORECTOMY Left 02/04/2017   Procedure: LEFT OOPHORECTOMY;  Surgeon: Thurman Flores, MD;  Location: [facility];  Service: Gynecology;  Laterality: Left;   Patient Active Problem List   Diagnosis Date Noted   Routine general medical examination at a health care facility 09/22/2023   Viral URI with cough 07/08/2023   Fatigue 03/13/2023   History of hysterectomy 03/13/2023   Pruritus 11/05/2022   Pruritic rash 11/05/2022   Allergic conjunctivitis of both eyes 11/05/2022   Mixed hyperlipidemia 09/05/2022   COVID-19 03/26/2022   Flank pain 02/04/2022   Chronic back pain 01/31/2022   Stress incontinence 09/04/2021   Laryngitis 08/30/2021   Right lower quadrant abdominal pain 08/23/2021   Urticaria 12/02/2019   Seasonal and perennial allergic rhinoconjunctivitis 12/02/2019   Morbid obesity (HCC) 11/11/2016   Anxiety state 12/19/2015   Essential hypertension 10/09/2015   CHF (congestive heart failure), NYHA class I, chronic, systolic (HCC) 10/09/2015   GERD (gastroesophageal reflux disease)     PCP: Odette Benjamin, NP  REFERRING PROVIDER: Donnamarie Gables, MD  REFERRING DIAG: [phone] (ICD-10-CM) - Left Achilles tendinitis   THERAPY DIAG:  Stiffness of left ankle, not elsewhere classified  Pain in left ankle  and joints of left foot  Muscle weakness (generalized)  Difficulty in walking, not elsewhere classified  RATIONALE FOR EVALUATION AND TREATMENT: Rehabilitation  ONSET DATE: ~ About 8 months ago  NEXT MD VISIT:  11/17/23   SUBJECTIVE:   SUBJECTIVE STATEMENT:10/30/23:  Notes her son in a MVA and she was going to see him in hospital and she stepped wrong and hurt the inside of her L heel.  Her L heel aches al the time   Eval: A year ago, doing yard work and fell into a hole that she didn't see. Heard a pop and went to the doctor. Now feels pain  on the side and top of foot. Doctor wants her to try DN and may need to replace tendon with another tendon from somewhere else. Also has back issues, bulging disc.  PERTINENT HISTORY:  GERD, Anxiety, CHF, sinus tachycardia, bulging disc w/ sciatica  PAIN:  Are you having pain? Yes: NPRS scale: 6.5/10 currently, 10/10 when standing on it and feels like it gives out Pain location: L Achilles, heel, and dorsum of foot Pain description: sharp,achy when sitting or sharp when standing Aggravating factors: Standing, dangling foot, sitting, walking, stairs, basic activity and depends on day Relieving factors: Hasn't found anything to relieve, rests  PRECAUTIONS: None  RED FLAGS: None   WEIGHT BEARING RESTRICTIONS: No  FALLS:  Has patient fallen in last 6 months? 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  DIAGNOSTIC FINDINGS:  N/A  PATIENT SURVEYS:  LEFS 57 / 80 = 71.3 %  COGNITION: Overall cognitive status: Within functional limits for tasks assessed     SENSATION: WFL  EDEMA:  Figure 8: R = 56 cm, L = 55 cm (in ankle brace prior to measurement); swelling around medial & lateral malleoli   MUSCLE LENGTH: Hamstrings: mod/severe tight L (limited by LBP), mild tight R ITB: Mild tightness Piriformis:  Hip flexors: Mild tightness Quads:  Heelcord: Mod tight  POSTURE: rounded shoulders, forward head, and flexed trunk   PALPATION: TTP over plantar  surface of heel through Achilles tendon up to musculotendinous junction at gastroc  LOWER EXTREMITY ROM:  A/PROM Right eval Left eval  Ankle dorsiflexion 0 / 6 0 p! / 4 p  Ankle plantarflexion 60 55 p!  Ankle inversion 28 32 ^  Ankle eversion 22 12 ^   (Blank rows = not tested, p = dull pain, p! = sharp pain, ^ = stretch)  LOWER EXTREMITY MMT:  MMT Right eval Left eval  Hip flexion 4 4-  Hip extension 4+ 4-  Hip abduction 4 4-  Hip adduction 4+ 4-  Hip internal rotation 3+ 3+  Hip external rotation 3+ 3+  Knee flexion    Knee extension    Ankle dorsiflexion 5 4 p!  Ankle plantarflexion    Ankle inversion 5 4  Ankle eversion 5 4   (Blank rows = not tested, p! = dull pain)  TREATMENT DATE:   10/30/23:  Manual:  Palpated thickened tissue mid achilles, tender, very tender and edematous achilles insertion Trigger Point Dry Needling  Initial Treatment: Pt instructed on Dry Needling rational, procedures, and possible side effects. Pt instructed to expect mild to moderate muscle soreness later in the day and/or into the next day.  Pt instructed in methods to reduce muscle soreness. Pt instructed to continue prescribed HEP. Patient was educated on signs and symptoms of infection and other risk factors and advised to seek medical attention should they occur.  Patient verbalized understanding of these instructions and education.   Patient Verbal Consent Given: Yes Education Handout Provided: Previously Provided Muscles Treated: L gastrocs,L soleus Electrical Stimulation Performed: No Treatment Response/Outcome: twitch lateral gastrocs and soleus  Light retrograde massage for edema reduction Kinesiotaping L ankle plantar surface L foot, extending to proximal med and lat gastrocs, 35% pull  Therex: instructed in isometric training for L ankle pflexors and  inversion with 6" playground ball for resistance, rpovided with additional written inst.    10/28/23 THERAPEUTIC EXERCISE: To improve endurance, ROM, and flexibility.  Demonstration, verbal and tactile cues throughout for technique. Seated Calf Stretch w/ strap Seated soleus stretch w/ strap  PATIENT EDUCATION:  Education details: anticipated POC, initial HEP Person educated: Patient Education method: Programmer, multimedia, Demonstration, Verbal cues, and Handouts Education comprehension: verbalized understanding, returned demonstration, verbal cues required, and needs further education  HOME EXERCISE PROGRAM: Access Code: [email or web address] URL: [email or web address] Date: 10/30/2023 Prepared by: Shaaron Dar  Exercises - Long Sitting Isometric Ankle Plantarflexion with Ball at [employer]  - 1 x daily - 7 x weekly - 1 sets - 10 reps - Isometric Ankle Inversion at Wall  - 1 x daily - 7 x weekly - 1 sets - 10 reps Access Code: [email or web address] URL: [email or web address] Date: 10/28/2023 Prepared by: Talia Joseph-Greene  Exercises - Seated Calf Stretch with Strap (Mirrored)  - 1 x daily - 7 x weekly - 3 reps - 30 sec hold - Seated Soleus Stretch with Strap  - 1 x daily - 7 x weekly - 3 reps - 30 sec hold  ASSESSMENT:  CLINICAL IMPRESSION: 10/30/23: today was initial treatment session following initial evaluation.  Today utilized both kinesiotaping and TPDN to improve tissue irritability.  Also discussed with her trial of compression sleeve to relieve sx.  Added isometrics for pain management and to engage painful/ damaged musculature.  She tolerated today's Rx well Noretta Bears Shaelyn is a 38 y.o. female who was seen today for physical therapy evaluation and treatment for L Achilles Tendinitis. Reported that her L achilles pain started about 8 months after a fall in the yard. [place] has show limitation and impaired muscle tension in LE  due to ankle and back pain. She has a bulging disc with sciatica  that radiates down her L leg, tending to favor her R side to alleviate the pain. She demonstrates R weight shift to avoid putting pressure on L ankle and due to her L sciatica. She also has clicking in her L anterior tib. Movement during the exam increased her pain in the back with shooting pain down her leg. The pre-existing condition and the L achilles tendinitis impacts her ability to walk, play with her kids and interferes with ADLs. Her goal is to get back to walking and have decreased pain in her L ankle. Vernelle will benefit from skilled PT to address above deficits to improve mobility, activity tolerance, quality of life with decreased pain interference.  OBJECTIVE  IMPAIRMENTS: Abnormal gait, decreased activity tolerance, decreased balance, decreased endurance, decreased knowledge of condition, decreased mobility, difficulty walking, decreased ROM, decreased strength, increased edema, increased fascial restrictions, impaired perceived functional ability, increased muscle spasms, impaired flexibility, impaired sensation, impaired tone, improper body mechanics, postural dysfunction, and pain.   ACTIVITY LIMITATIONS: carrying, lifting, sitting, standing, squatting, stairs, bed mobility, locomotion level, and caring for others  PARTICIPATION LIMITATIONS: meal prep, cleaning, laundry, interpersonal relationship, driving, community activity, occupation, and yard work  PERSONAL FACTORS: Past/current experiences, Time since onset of injury/illness/exacerbation, and 3+ comorbidities:  bulging disc w/ sciatica, GERD, Anxiety, CHF, sinus tachycardia  are also affecting patient's functional outcome.   REHAB POTENTIAL: Good  CLINICAL DECISION MAKING: Evolving/moderate complexity  EVALUATION COMPLEXITY: Moderate   GOALS: Goals reviewed with patient? Yes  SHORT TERM GOALS: Target date: 11/25/2023  1.  Pt will be independent with initial HEP Baseline:  Goal status: INITIAL  2.  Patient will report  >/= 61/80 on LEFS (MCID = 9 pts) to demonstrate improved functional ability.  Baseline: 57 / 80 = 71.3 % Goal status: INITIAL  3.  Pt will report 25% improvement in L achilles pain to improve QoL Baseline: 6.5/10 currently, 10/10 when standing Goal status: INITIAL  4.  Pt will improve 5 of B ankle DF for ease of mobility Baseline: Refer to ROM table Goal status: INITIAL    LONG TERM GOALS: Target date: 12/23/2023   1.  Pt will report 50-75% improvement in L achilles pain Baseline: 6.5/10 currently, 10/10 when standing Goal status: INITIAL   2.  Pt will improve B LE strength to >=4+/5 for improved stability and ease of mobility Baseline: Refer to MMT table Goal status: INITIAL   3.  Patient will report >/= 66/80 on LEFS (MCID = 9 pts) to demonstrate improved functional ability.  Baseline: 57 / 80 = 71.3 % Goal status: INITIAL  4.  Patient will demonstrate improved R ankle DF AROM to WNL to allow for normal gait and stair mechanics.  Baseline: Refer to ROM table Goal status: INITIAL  5.  Patient will be independent with advanced/ongoing HEP to improve outcomes and carryover.  Baseline:  Goal status: INITIAL  6.  Pt will be able to stand and sit for 1 hour without increased L heel pain for increase performance for housework Baseline: Unable to sit or stand for 1 hour Goal status: INITIAL  7.  Pt will be able to ambulate 600" with normalized gait pattern for safe community mobility Baseline: Mod difficulty to walk - LEFS Goal status: INITIAL    PLAN:  PT FREQUENCY: 2x/week  PT DURATION: 8 weeks  PLANNED INTERVENTIONS: 97164- PT Re-evaluation, 97750- Physical Performance Testing, 97110-Therapeutic exercises, 97530- Therapeutic activity, [id number]- Neuromuscular re-education, 97535- Self Care, 16109- Manual therapy, [id number]- Gait training, [phone]- Aquatic Therapy, [phone]- Ultrasound, Patient/Family education, Balance training, Stair training, Taping, Dry Needling, Joint  mobilization, Cryotherapy, and Moist heat  PLAN FOR NEXT SESSION: Review initial HEP, MT +/- TPDN to gastroc, soleus; progress ankle and foot intrinsic strengthening; progress proximal LE strengthening; initiate balance and proprioceptive training; taping to offload achilles    Jmichael Gille L Heather Mckendree, PT, DPT, OCS 10/30/2023, 4:43 PM

## 2023-11-03 ENCOUNTER — Ambulatory Visit

## 2023-11-03 DIAGNOSIS — M6281 Muscle weakness (generalized): Secondary | ICD-10-CM

## 2023-11-03 DIAGNOSIS — R262 Difficulty in walking, not elsewhere classified: Secondary | ICD-10-CM | POA: Diagnosis not present

## 2023-11-03 DIAGNOSIS — M25672 Stiffness of left ankle, not elsewhere classified: Secondary | ICD-10-CM

## 2023-11-03 DIAGNOSIS — M5416 Radiculopathy, lumbar region: Secondary | ICD-10-CM | POA: Diagnosis not present

## 2023-11-03 DIAGNOSIS — M25572 Pain in left ankle and joints of left foot: Secondary | ICD-10-CM

## 2023-11-03 NOTE — Therapy (Signed)
 OUTPATIENT PHYSICAL THERAPY LOWER EXTREMITY TREATMENT   Patient Name: Michelle Kane MRN: [id number] DOB:Jul 23, 1985, 38 y.o., female Today's Date: 11/03/2023  END OF SESSION:  PT End of Session - 11/03/23 1235     Visit Number 3    Date for PT Re-Evaluation 12/23/23    Authorization Type [employer]    Authorization Time Period AUTH pending    Authorization - Visit Number 2    Progress Note Due on Visit 10    PT Start Time 1140    PT Stop Time 1225    PT Time Calculation (min) 45 min    Activity Tolerance Patient limited by pain;Patient tolerated treatment well    Behavior During Therapy WFL for tasks assessed/performed               Past Medical History:  Diagnosis Date   Allergy -induced asthma    Allergy  induced asthma   Anxiety    Bronchitis    Cancer (HCC)    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 EF 40-45% in 2016, varying by several echoes. F/u echo 06/2015 showed EF 50%.    PONV (postoperative nausea and vomiting)    Sinus tachycardia    a. 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 b. 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   Past Surgical History:  Procedure Laterality Date   ABDOMINAL HYSTERECTOMY     CESAREAN SECTION  03-01-2008  and 2016   IM PINNING RIGHT ELBOW FX  1992   HARDWARE REMOVED   LAPAROSCOPIC ASSISTED VAGINAL HYSTERECTOMY Left 02/04/2017   Procedure: LAPAROSCOPIC ASSISTED VAGINAL HYSTERECTOMY, LEFT SALPINGOOPHORECTOMY;  Surgeon: Thurman Flores, MD;  Location: [facility];  Service: Gynecology;  Laterality: Left;  NEED BED   LAPAROSCOPY N/A 07/29/2014   Procedure:  LAPAROSCOPY DIAGNOSTIC;  Surgeon: Martine Sleek, MD;  Location: [facility];  Service: Gynecology;  Laterality: N/A;   OOPHORECTOMY Left 02/04/2017   Procedure: LEFT OOPHORECTOMY;  Surgeon: Thurman Flores, MD;  Location: [facility];  Service: Gynecology;  Laterality: Left;   Patient Active Problem List   Diagnosis Date Noted   Routine general medical examination at a health care facility 09/22/2023   Viral URI with cough 07/08/2023   Fatigue 03/13/2023   History of hysterectomy 03/13/2023   Pruritus 11/05/2022   Pruritic rash 11/05/2022   Allergic conjunctivitis of both eyes 11/05/2022   Mixed hyperlipidemia 09/05/2022   COVID-19 03/26/2022   Flank pain 02/04/2022   Chronic back pain 01/31/2022   Stress incontinence 09/04/2021   Laryngitis 08/30/2021   Right lower quadrant abdominal pain 08/23/2021   Urticaria 12/02/2019   Seasonal and perennial allergic rhinoconjunctivitis 12/02/2019   Morbid obesity (HCC) 11/11/2016   Anxiety state 12/19/2015   Essential hypertension 10/09/2015   CHF (congestive heart failure), NYHA class I, chronic, systolic (HCC) 10/09/2015   GERD (gastroesophageal reflux disease)     PCP: Odette Benjamin, NP  REFERRING PROVIDER: Donnamarie Gables, MD  REFERRING DIAG: [phone] (ICD-10-CM) - Left Achilles tendinitis   THERAPY DIAG:  Stiffness of left  ankle, not elsewhere classified  Pain in left ankle and joints of left foot  Muscle weakness (generalized)  Difficulty in walking, not elsewhere classified  RATIONALE FOR EVALUATION AND TREATMENT: Rehabilitation  ONSET DATE: ~ About 8 months ago  NEXT MD VISIT:  11/17/23   SUBJECTIVE:   SUBJECTIVE STATEMENT:  Pt reports nothing has helped so far.  Eval: A year ago, doing yard work and fell into a hole that she didn't see. Heard a pop and went to the doctor. Now feels pain on the side and top of foot. Doctor wants her to try DN and may need to replace tendon  with another tendon from somewhere else. Also has back issues, bulging disc.  PERTINENT HISTORY:  GERD, Anxiety, CHF, sinus tachycardia, bulging disc w/ sciatica  PAIN:  Are you having pain? Yes: NPRS scale: 6.5/10 currently, 10/10 when standing on it and feels like it gives out Pain location: L Achilles, heel, and dorsum of foot Pain description: sharp,achy when sitting or sharp when standing Aggravating factors: Standing, dangling foot, sitting, walking, stairs, basic activity and depends on day Relieving factors: Hasn't found anything to relieve, rests  PRECAUTIONS: None  RED FLAGS: None   WEIGHT BEARING RESTRICTIONS: No  FALLS:  Has patient fallen in last 6 months? 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  DIAGNOSTIC FINDINGS:  N/A  PATIENT SURVEYS:  LEFS 57 / 80 = 71.3 %  COGNITION: Overall cognitive status: Within functional limits for tasks assessed     SENSATION: WFL  EDEMA:  Figure 8: R = 56 cm, L = 55 cm (in ankle brace prior to measurement); swelling around medial & lateral malleoli   MUSCLE LENGTH: Hamstrings: mod/severe tight L (limited by LBP), mild tight R ITB: Mild tightness Piriformis:  Hip flexors: Mild tightness Quads:  Heelcord: Mod tight  POSTURE: rounded shoulders, forward head, and flexed trunk   PALPATION: TTP over plantar surface of heel through Achilles tendon up to musculotendinous junction at gastroc  LOWER  EXTREMITY ROM:  A/PROM Right eval Left eval  Ankle dorsiflexion 0 / 6 0 p! / 4 p  Ankle plantarflexion 60 55 p!  Ankle inversion 28 32 ^  Ankle eversion 22 12 ^   (Blank rows = not tested, p = dull pain, p! = sharp pain, ^ = stretch)  LOWER EXTREMITY MMT:  MMT Right eval Left eval  Hip flexion 4 4-  Hip extension 4+ 4-  Hip abduction 4 4-  Hip adduction 4+ 4-  Hip internal rotation 3+ 3+  Hip external rotation 3+ 3+  Knee flexion    Knee extension    Ankle dorsiflexion 5 4 p!  Ankle plantarflexion    Ankle inversion 5 4  Ankle eversion 5 4   (Blank rows = not tested, p! = dull pain)  TREATMENT DATE:   11/03/23 Therapeutic Exercise: to improve strength, ROM, flexibility, and endurance  Nustep L4x28min Isometric PF, EV, IV, DF 5x5" Long sitting gastroc and soleus stretches 2x30" Toe curls 2x10 Toe spreads x 10 Toe yoga x 10  Manual Therapy: to decrease muscle spasm, pain and improve mobility.  STM to L gastroc, cross friction to achilles tendon  10/30/23:  Manual:  Palpated thickened tissue mid achilles, tender, very tender and edematous achilles insertion Trigger Point Dry Needling  Initial Treatment: Pt instructed on Dry Needling rational, procedures, and possible side effects. Pt instructed to expect mild to moderate muscle soreness later in the day and/or into the next day.  Pt instructed in methods to reduce muscle soreness. Pt instructed to continue prescribed HEP. Patient was educated on signs and symptoms of infection and other risk factors and advised to seek medical attention should they occur.  Patient verbalized understanding of these instructions and education.   Patient Verbal Consent Given: Yes Education Handout Provided: Previously Provided Muscles Treated: L gastrocs,L soleus Electrical Stimulation Performed: No Treatment  Response/Outcome: twitch lateral gastrocs and soleus  Light retrograde massage for edema reduction Kinesiotaping L ankle plantar surface L foot, extending to proximal med and lat gastrocs, 35% pull  Therex: instructed in isometric training for L ankle pflexors and inversion with 6" playground ball for resistance, rpovided with additional written inst.    10/28/23 THERAPEUTIC EXERCISE: To improve endurance, ROM, and flexibility.  Demonstration, verbal and tactile cues throughout for technique. Seated Calf Stretch w/ strap Seated soleus stretch w/ strap  PATIENT EDUCATION:  Education details: anticipated POC, initial HEP Person educated: Patient Education method: Programmer, multimedia, Demonstration, Verbal cues, and Handouts Education comprehension: verbalized understanding, returned demonstration, verbal cues required, and needs further education  HOME EXERCISE PROGRAM: Access Code: [email or web address] URL: [email or web address] Date: 10/30/2023 Prepared by: Shaaron Dar  Exercises - Long Sitting Isometric Ankle Plantarflexion with Ball at [employer]  - 1 x daily - 7 x weekly - 1 sets - 10 reps - Isometric Ankle Inversion at Wall  - 1 x daily - 7 x weekly - 1 sets - 10 reps Access Code: [email or web address] URL: [email or web address] Date: 10/28/2023 Prepared by: Talia Joseph-Greene  Exercises - Seated Calf Stretch with Strap (Mirrored)  - 1 x daily - 7 x weekly - 3 reps - 30 sec hold - Seated Soleus Stretch with Strap  - 1 x daily - 7 x weekly - 3 reps - 30 sec hold  ASSESSMENT:  CLINICAL IMPRESSION:  Pt denies much improve thus far in her sx. We continued working on isometric ankle strengthening and foot intrinsics. She had very tender medial gastroc head and dense tissue over mid achilles tendon. The isometric EV seemed to aggravate her heel but after the manual and stretches this went away. Noretta Bears Keyli is a 38 y.o. female who was seen today for physical therapy evaluation and treatment  for L Achilles Tendinitis. Reported that her L achilles pain started about 8 months after a fall in the yard. Gerline has show limitation and impaired muscle tension in LE  due to ankle and back pain. She has a bulging disc with sciatica that radiates down her L leg, tending to favor her R side to alleviate the pain. She demonstrates R weight shift to avoid putting pressure on L ankle and due to her L sciatica. She also has clicking in her L anterior tib. Movement during the exam increased her pain in the back with shooting pain down her leg. The  pre-existing condition and the L achilles tendinitis impacts her ability to walk, play with her kids and interferes with ADLs. Her goal is to get back to walking and have decreased pain in her L ankle. Tanessa will benefit from skilled PT to address above deficits to improve mobility, activity tolerance, quality of life with decreased pain interference.  OBJECTIVE IMPAIRMENTS: Abnormal gait, decreased activity tolerance, decreased balance, decreased endurance, decreased knowledge of condition, decreased mobility, difficulty walking, decreased ROM, decreased strength, increased edema, increased fascial restrictions, impaired perceived functional ability, increased muscle spasms, impaired flexibility, impaired sensation, impaired tone, improper body mechanics, postural dysfunction, and pain.   ACTIVITY LIMITATIONS: carrying, lifting, sitting, standing, squatting, stairs, bed mobility, locomotion level, and caring for others  PARTICIPATION LIMITATIONS: meal prep, cleaning, laundry, interpersonal relationship, driving, community activity, occupation, and yard work  PERSONAL FACTORS: Past/current experiences, Time since onset of injury/illness/exacerbation, and 3+ comorbidities:  bulging disc w/ sciatica, GERD, Anxiety, CHF, sinus tachycardia  are also affecting patient's functional outcome.   REHAB POTENTIAL: Good  CLINICAL DECISION MAKING: Evolving/moderate  complexity  EVALUATION COMPLEXITY: Moderate   GOALS: Goals reviewed with patient? Yes  SHORT TERM GOALS: Target date: 11/25/2023  1.  Pt will be independent with initial HEP Baseline:  Goal status: INITIAL  2.  Patient will report >/= 61/80 on LEFS (MCID = 9 pts) to demonstrate improved functional ability.  Baseline: 57 / 80 = 71.3 % Goal status: INITIAL  3.  Pt will report 25% improvement in L achilles pain to improve QoL Baseline: 6.5/10 currently, 10/10 when standing Goal status: INITIAL  4.  Pt will improve 5 of B ankle DF for ease of mobility Baseline: Refer to ROM table Goal status: INITIAL    LONG TERM GOALS: Target date: 12/23/2023   1.  Pt will report 50-75% improvement in L achilles pain Baseline: 6.5/10 currently, 10/10 when standing Goal status: INITIAL   2.  Pt will improve B LE strength to >=4+/5 for improved stability and ease of mobility Baseline: Refer to MMT table Goal status: INITIAL   3.  Patient will report >/= 66/80 on LEFS (MCID = 9 pts) to demonstrate improved functional ability.  Baseline: 57 / 80 = 71.3 % Goal status: INITIAL  4.  Patient will demonstrate improved R ankle DF AROM to WNL to allow for normal gait and stair mechanics.  Baseline: Refer to ROM table Goal status: INITIAL  5.  Patient will be independent with advanced/ongoing HEP to improve outcomes and carryover.  Baseline:  Goal status: INITIAL  6.  Pt will be able to stand and sit for 1 hour without increased L heel pain for increase performance for housework Baseline: Unable to sit or stand for 1 hour Goal status: INITIAL  7.  Pt will be able to ambulate 600" with normalized gait pattern for safe community mobility Baseline: Mod difficulty to walk - LEFS Goal status: INITIAL    PLAN:  PT FREQUENCY: 2x/week  PT DURATION: 8 weeks  PLANNED INTERVENTIONS: 97164- PT Re-evaluation, 97750- Physical Performance Testing, 97110-Therapeutic exercises, 97530- Therapeutic  activity, [id number]- Neuromuscular re-education, 97535- Self Care, [id number]- Manual therapy, [phone]- Gait training, [phone]- Aquatic Therapy, [phone]- Ultrasound, Patient/Family education, Balance training, Stair training, Taping, Dry Needling, Joint mobilization, Cryotherapy, and Moist heat  PLAN FOR NEXT SESSION: Review initial HEP, MT +/- TPDN to gastroc, soleus; progress ankle and foot intrinsic strengthening; progress proximal LE strengthening; initiate balance and proprioceptive training; taping to offload achilles    Samuella Crocker, PTA 11/03/2023,  12:35 PM

## 2023-11-05 DIAGNOSIS — Z860100 Personal history of colon polyps, unspecified: Secondary | ICD-10-CM | POA: Diagnosis not present

## 2023-11-05 DIAGNOSIS — K2100 Gastro-esophageal reflux disease with esophagitis, without bleeding: Secondary | ICD-10-CM | POA: Diagnosis not present

## 2023-11-05 DIAGNOSIS — K635 Polyp of colon: Secondary | ICD-10-CM | POA: Diagnosis not present

## 2023-11-05 DIAGNOSIS — I110 Hypertensive heart disease with heart failure: Secondary | ICD-10-CM | POA: Diagnosis not present

## 2023-11-05 DIAGNOSIS — Z87891 Personal history of nicotine dependence: Secondary | ICD-10-CM | POA: Diagnosis not present

## 2023-11-05 DIAGNOSIS — Q402 Other specified congenital malformations of stomach: Secondary | ICD-10-CM | POA: Diagnosis not present

## 2023-11-05 DIAGNOSIS — K449 Diaphragmatic hernia without obstruction or gangrene: Secondary | ICD-10-CM | POA: Diagnosis not present

## 2023-11-05 DIAGNOSIS — R1013 Epigastric pain: Secondary | ICD-10-CM | POA: Diagnosis not present

## 2023-11-05 DIAGNOSIS — Z7951 Long term (current) use of inhaled steroids: Secondary | ICD-10-CM | POA: Diagnosis not present

## 2023-11-05 DIAGNOSIS — I509 Heart failure, unspecified: Secondary | ICD-10-CM | POA: Diagnosis not present

## 2023-11-05 DIAGNOSIS — K3189 Other diseases of stomach and duodenum: Secondary | ICD-10-CM | POA: Diagnosis not present

## 2023-11-05 DIAGNOSIS — Z1211 Encounter for screening for malignant neoplasm of colon: Secondary | ICD-10-CM | POA: Diagnosis not present

## 2023-11-05 DIAGNOSIS — J45909 Unspecified asthma, uncomplicated: Secondary | ICD-10-CM | POA: Diagnosis not present

## 2023-11-05 DIAGNOSIS — K219 Gastro-esophageal reflux disease without esophagitis: Secondary | ICD-10-CM | POA: Diagnosis not present

## 2023-11-05 DIAGNOSIS — Z83719 Family history of colon polyps, unspecified: Secondary | ICD-10-CM | POA: Diagnosis not present

## 2023-11-05 DIAGNOSIS — D125 Benign neoplasm of sigmoid colon: Secondary | ICD-10-CM | POA: Diagnosis not present

## 2023-11-05 DIAGNOSIS — K648 Other hemorrhoids: Secondary | ICD-10-CM | POA: Diagnosis not present

## 2023-11-05 DIAGNOSIS — K5730 Diverticulosis of large intestine without perforation or abscess without bleeding: Secondary | ICD-10-CM | POA: Diagnosis not present

## 2023-11-05 DIAGNOSIS — Z79899 Other long term (current) drug therapy: Secondary | ICD-10-CM | POA: Diagnosis not present

## 2023-11-06 ENCOUNTER — Ambulatory Visit: Payer: Self-pay

## 2023-11-06 ENCOUNTER — Other Ambulatory Visit: Payer: Self-pay

## 2023-11-06 DIAGNOSIS — M6281 Muscle weakness (generalized): Secondary | ICD-10-CM

## 2023-11-06 DIAGNOSIS — R262 Difficulty in walking, not elsewhere classified: Secondary | ICD-10-CM

## 2023-11-06 DIAGNOSIS — M25572 Pain in left ankle and joints of left foot: Secondary | ICD-10-CM

## 2023-11-06 DIAGNOSIS — M25672 Stiffness of left ankle, not elsewhere classified: Secondary | ICD-10-CM

## 2023-11-06 NOTE — Therapy (Signed)
 OUTPATIENT PHYSICAL THERAPY LOWER EXTREMITY TREATMENT   Patient Name: Michelle Kane MRN: [id number] DOB:1986-04-24, 38 y.o., female Today's Date: 11/06/2023  END OF SESSION:  PT End of Session - 11/06/23 1054     Visit Number 4    Date for PT Re-Evaluation 12/23/23    Authorization Type [employer]    Authorization Time Period AUTH pending    Progress Note Due on Visit 10    PT Start Time 0845    PT Stop Time 0930    PT Time Calculation (min) 45 min    Activity Tolerance Patient limited by pain;Patient tolerated treatment well    Behavior During Therapy WFL for tasks assessed/performed                Past Medical History:  Diagnosis Date   Allergy -induced asthma    Allergy  induced asthma   Anxiety    Bronchitis    Cancer (HCC)    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 EF 40-45% in 2016, varying by several echoes. F/u echo 06/2015 showed EF 50%.    PONV (postoperative nausea and vomiting)    Sinus tachycardia    a. 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 b. 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   Past Surgical History:  Procedure Laterality Date   ABDOMINAL HYSTERECTOMY     CESAREAN SECTION  03-01-2008  and 2016   IM PINNING RIGHT ELBOW FX  1992   HARDWARE REMOVED   LAPAROSCOPIC ASSISTED VAGINAL HYSTERECTOMY Left 02/04/2017   Procedure: LAPAROSCOPIC ASSISTED VAGINAL HYSTERECTOMY, LEFT SALPINGOOPHORECTOMY;  Surgeon: Thurman Flores, MD;  Location: [facility];  Service: Gynecology;  Laterality: Left;  NEED BED   LAPAROSCOPY N/A 07/29/2014   Procedure: LAPAROSCOPY DIAGNOSTIC;  Surgeon:  Martine Sleek, MD;  Location: [facility];  Service: Gynecology;  Laterality: N/A;   OOPHORECTOMY Left 02/04/2017   Procedure: LEFT OOPHORECTOMY;  Surgeon: Thurman Flores, MD;  Location: [facility];  Service: Gynecology;  Laterality: Left;   Patient Active Problem List   Diagnosis Date Noted   Routine general medical examination at a health care facility 09/22/2023   Viral URI with cough 07/08/2023   Fatigue 03/13/2023   History of hysterectomy 03/13/2023   Pruritus 11/05/2022   Pruritic rash 11/05/2022   Allergic conjunctivitis of both eyes 11/05/2022   Mixed hyperlipidemia 09/05/2022   COVID-19 03/26/2022   Flank pain 02/04/2022   Chronic back pain 01/31/2022   Stress incontinence 09/04/2021   Laryngitis 08/30/2021   Right lower quadrant abdominal pain 08/23/2021   Urticaria 12/02/2019   Seasonal and perennial allergic rhinoconjunctivitis 12/02/2019   Morbid obesity (HCC) 11/11/2016   Anxiety state 12/19/2015   Essential hypertension 10/09/2015   CHF (congestive heart failure), NYHA class I, chronic, systolic (HCC) 10/09/2015   GERD (gastroesophageal reflux disease)     PCP: Odette Benjamin, NP  REFERRING PROVIDER: Donnamarie Gables, MD  REFERRING DIAG: [phone] (ICD-10-CM) - Left Achilles tendinitis   THERAPY DIAG:  Stiffness of left ankle, not elsewhere classified  Pain in  left ankle and joints of left foot  Muscle weakness (generalized)  Difficulty in walking, not elsewhere classified  RATIONALE FOR EVALUATION AND TREATMENT: Rehabilitation  ONSET DATE: ~ About 8 months ago  NEXT MD VISIT:  11/17/23   SUBJECTIVE:   SUBJECTIVE STATEMENT:  Pt reports hasn't really flared up ankle with stepping, painful, aching a lot medial calcaneus and ankle.    Eval: A year ago, doing yard work and fell into a hole that she didn't see. Heard a pop and went to the doctor. Now feels pain on the side and top of foot. Doctor wants her to try  DN and may need to replace tendon with another tendon from somewhere else. Also has back issues, bulging disc.  PERTINENT HISTORY:  GERD, Anxiety, CHF, sinus tachycardia, bulging disc w/ sciatica  PAIN:  Are you having pain? Yes: NPRS scale: 6.5/10 currently, 10/10 when standing on it and feels like it gives out Pain location: L Achilles, heel, and dorsum of foot Pain description: sharp,achy when sitting or sharp when standing Aggravating factors: Standing, dangling foot, sitting, walking, stairs, basic activity and depends on day Relieving factors: Hasn't found anything to relieve, rests  PRECAUTIONS: None  RED FLAGS: None   WEIGHT BEARING RESTRICTIONS: No  FALLS:  Has patient fallen in last 6 months? 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  DIAGNOSTIC FINDINGS:  N/A  PATIENT SURVEYS:  LEFS 57 / 80 = 71.3 %  COGNITION: Overall cognitive status: Within functional limits for tasks assessed     SENSATION: WFL  EDEMA:  Figure 8: R = 56 cm, L = 55 cm (in ankle brace prior to measurement); swelling around medial & lateral malleoli   MUSCLE LENGTH: Hamstrings: mod/severe tight L (limited by LBP), mild tight R ITB: Mild tightness Piriformis:  Hip flexors: Mild tightness Quads:  Heelcord: Mod tight  POSTURE: rounded shoulders, forward head, and flexed trunk   PALPATION: TTP over plantar surface of heel through Achilles tendon up to  musculotendinous junction at gastroc  LOWER EXTREMITY ROM:  A/PROM Right eval Left eval  Ankle dorsiflexion 0 / 6 0 p! / 4 p  Ankle plantarflexion 60 55 p!  Ankle inversion 28 32 ^  Ankle eversion 22 12 ^   (Blank rows = not tested, p = dull pain, p! = sharp pain, ^ = stretch)  LOWER EXTREMITY MMT:  MMT Right eval Left eval  Hip flexion 4 4-  Hip extension 4+ 4-  Hip abduction 4 4-  Hip adduction 4+ 4-  Hip internal rotation 3+ 3+  Hip external rotation 3+ 3+  Knee flexion    Knee extension    Ankle dorsiflexion 5 4 p!  Ankle plantarflexion    Ankle inversion 5 4  Ankle eversion 5 4   (Blank rows = not tested, p! = dull pain)  TREATMENT DATE:  11/06/23:  Therapeutic activity: Utilized Nustep for 5 min for tissue perfusion prior to manual techniques and stretching. Manual stretching, gentle L ankle dorsiflexion, ev, inv  Manual: Supine for A/P glides R subtalar jt,combined with L ankle dorsiflexion stretch,  also general jt mobs L mid foot , navicular Prone for retrograde massage to relieve edema L ankle, also myofascial release with blade gastroc and soleus musculature  Trigger Point Dry Needling  Subsequent Treatment: Instructions provided previously at initial dry needling treatment.   Patient Verbal Consent Given: Yes Education Handout Provided: Yes Muscles Treated: L soleus, 2 pts, L gastroc medially Electrical Stimulation Performed: No Treatment Response/Outcome: twitch response , decreased tissue resistance Kinesiotaping L foot/lower leg, 2 -I pieces to support the achilles, plantarflexor musculature  No new ex instruction today, reviewed verbally the isometrics as shown previously   11/03/23 Therapeutic Exercise: to improve strength, ROM, flexibility, and endurance  Nustep L4x36min Isometric PF, EV, IV, DF 5x5" Long sitting gastroc  and soleus stretches 2x30" Toe curls 2x10 Toe spreads x 10 Toe yoga x 10  Manual Therapy: to decrease muscle spasm, pain and improve mobility.  STM to L gastroc, cross friction to achilles tendon  10/30/23:  Manual:  Palpated thickened tissue mid achilles, tender, very tender and edematous achilles insertion Trigger Point Dry Needling  Initial Treatment: Pt instructed on Dry Needling rational, procedures, and possible side effects. Pt instructed to expect mild to moderate muscle soreness later in the day and/or into the next day.  Pt instructed in methods to reduce muscle soreness. Pt instructed to continue prescribed HEP. Patient was educated on signs and symptoms of infection and other risk factors and advised to seek medical attention should they occur.  Patient verbalized understanding of these instructions and education.   Patient Verbal Consent Given: Yes Education Handout Provided: Previously Provided Muscles Treated: L gastrocs,L soleus Electrical Stimulation Performed: No Treatment Response/Outcome: twitch lateral gastrocs and soleus  Light retrograde massage for edema reduction Kinesiotaping L ankle plantar surface L foot, extending to proximal med and lat gastrocs, 35% pull  Therex: instructed in isometric training for L ankle pflexors and inversion with 6" playground ball for resistance, rpovided with additional written inst.    10/28/23 THERAPEUTIC EXERCISE: To improve endurance, ROM, and flexibility.  Demonstration, verbal and tactile cues throughout for technique. Seated Calf Stretch w/ strap Seated soleus stretch w/ strap  PATIENT EDUCATION:  Education details: anticipated POC, initial HEP Person educated: Patient Education method: Programmer, multimedia, Demonstration, Verbal cues, and Handouts Education comprehension: verbalized understanding, returned demonstration, verbal cues required, and needs further education  HOME EXERCISE PROGRAM: Access Code: [email or web address] URL:  [email or web address] Date: 10/30/2023 Prepared by: Shaaron Dar  Exercises - Long Sitting Isometric Ankle Plantarflexion with Ball at [employer]  - 1 x daily - 7 x weekly - 1 sets - 10 reps - Isometric Ankle Inversion at Wall  - 1 x daily - 7 x weekly - 1 sets - 10 reps Access Code: [email or web address] URL: [email or web address] Date: 10/28/2023 Prepared by: Talia Joseph-Greene  Exercises - Seated Calf Stretch with Strap (Mirrored)  - 1 x daily - 7 x weekly - 3 reps - 30 sec hold - Seated Soleus Stretch with Strap  - 1 x daily - 7 x weekly - 3 reps - 30 sec hold  ASSESSMENT:  CLINICAL IMPRESSION:  Pt returned for her 4th PT visit today.  Did respond well the other day to the kinesiotaping L lower leg, but it came off after 3 hours,  so attempted again today, with reinforcements over MT heads.  Very tender, thick, along gastrocs and soleus musculature medial lower leg, also still edematous L ankle around malleoli.  Did improve dorsiflexion ROM after stretching, manual techniques today. Will continue and will change techniques as appropriate.  Michelle Kane is a 38 y.o. female who was seen today for physical therapy evaluation and treatment for L Achilles Tendinitis. Reported that her L achilles pain started about 8 months after a fall in the yard. Michelle Kane has show limitation and impaired muscle tension in LE  due to ankle and back pain. She has a bulging disc with sciatica that radiates down her L leg, tending to favor her R side to alleviate the pain. She demonstrates R weight shift to avoid putting pressure on L ankle and due to her L sciatica. She also has clicking in her L anterior tib. Movement during the exam increased her pain in the back with shooting pain down her leg. The pre-existing condition and the L achilles tendinitis impacts her ability to walk, play with her kids and interferes with ADLs. Her goal is to get back to walking and have decreased pain in her L ankle. Michelle Kane  will benefit from skilled PT to address above deficits to improve mobility, activity tolerance, quality of life with decreased pain interference.  OBJECTIVE IMPAIRMENTS: Abnormal gait, decreased activity tolerance, decreased balance, decreased endurance, decreased knowledge of condition, decreased mobility, difficulty walking, decreased ROM, decreased strength, increased edema, increased fascial restrictions, impaired perceived functional ability, increased muscle spasms, impaired flexibility, impaired sensation, impaired tone, improper body mechanics, postural dysfunction, and pain.   ACTIVITY LIMITATIONS: carrying, lifting, sitting, standing, squatting, stairs, bed mobility, locomotion level, and caring for others  PARTICIPATION LIMITATIONS: meal prep, cleaning, laundry, interpersonal relationship, driving, community activity, occupation, and yard work  PERSONAL FACTORS: Past/current experiences, Time since onset of injury/illness/exacerbation, and 3+ comorbidities:  bulging disc w/ sciatica, GERD, Anxiety, CHF, sinus tachycardia  are also affecting patient's functional outcome.   REHAB POTENTIAL: Good  CLINICAL DECISION MAKING: Evolving/moderate complexity  EVALUATION COMPLEXITY: Moderate   GOALS: Goals reviewed with patient? Yes  SHORT TERM GOALS: Target date: 11/25/2023  1.  Pt will be independent with initial HEP Baseline:  Goal status: INITIAL  2.  Patient will report >/= 61/80 on LEFS (MCID = 9 pts) to demonstrate improved functional ability.  Baseline: 57 / 80 = 71.3 % Goal status: INITIAL  3.  Pt will report 25% improvement in L achilles pain to improve QoL Baseline: 6.5/10 currently, 10/10 when standing Goal status: INITIAL  4.  Pt will improve 5 of B ankle DF for ease of mobility Baseline: Refer to ROM table Goal status: INITIAL    LONG TERM GOALS: Target date: 12/23/2023   1.  Pt will report 50-75% improvement in L achilles pain Baseline: 6.5/10 currently, 10/10  when standing Goal status: INITIAL   2.  Pt will improve B LE strength to >=4+/5 for improved stability and ease of mobility Baseline: Refer to MMT table Goal status: INITIAL   3.  Patient will report >/= 66/80 on LEFS (MCID = 9 pts) to demonstrate improved functional ability.  Baseline: 57 / 80 = 71.3 % Goal status: INITIAL  4.  Patient will demonstrate improved R ankle DF AROM to WNL to allow for normal gait and stair mechanics.  Baseline: Refer to ROM table Goal status: INITIAL  5.  Patient will be independent with advanced/ongoing HEP to improve outcomes and carryover.  Baseline:  Goal status: INITIAL  6.  Pt will be able to stand and sit for 1 hour without increased L heel pain for increase performance for housework Baseline: Unable to sit or stand for 1 hour Goal status: INITIAL  7.  Pt will be able to ambulate 600" with normalized gait pattern for safe community mobility Baseline: Mod difficulty to walk - LEFS Goal status: INITIAL    PLAN:  PT FREQUENCY: 2x/week  PT DURATION: 8 weeks  PLANNED INTERVENTIONS: 97164- PT Re-evaluation, 97750- Physical Performance Testing, 97110-Therapeutic exercises, 97530- Therapeutic activity, [id number]- Neuromuscular re-education, 97535- Self Care, [id number]- Manual therapy, [id number]- Gait training, [phone]- Aquatic Therapy, [phone]- Ultrasound, Patient/Family education, Balance training, Stair training, Taping, Dry Needling, Joint mobilization, Cryotherapy, and Moist heat  PLAN FOR NEXT SESSION: ,MT +/- TPDN to gastroc, soleus; progress ankle and foot intrinsic strengthening; progress proximal LE strengthening; initiate balance and proprioceptive training; taping to offload achilles    Berneita Sanagustin L Lawson Mahone, PT, DPT, OCS 11/06/2023, 10:56 AM

## 2023-11-10 ENCOUNTER — Encounter

## 2023-11-10 DIAGNOSIS — M545 Low back pain, unspecified: Secondary | ICD-10-CM | POA: Diagnosis not present

## 2023-11-12 ENCOUNTER — Ambulatory Visit

## 2023-11-12 DIAGNOSIS — M25572 Pain in left ankle and joints of left foot: Secondary | ICD-10-CM

## 2023-11-12 DIAGNOSIS — M25672 Stiffness of left ankle, not elsewhere classified: Secondary | ICD-10-CM | POA: Diagnosis not present

## 2023-11-12 DIAGNOSIS — M6281 Muscle weakness (generalized): Secondary | ICD-10-CM | POA: Diagnosis not present

## 2023-11-12 DIAGNOSIS — R262 Difficulty in walking, not elsewhere classified: Secondary | ICD-10-CM

## 2023-11-12 NOTE — Therapy (Signed)
 OUTPATIENT PHYSICAL THERAPY LOWER EXTREMITY TREATMENT   Patient Name: Michelle Kane MRN: [id number] DOB:December 13, 1985, 38 y.o., female Today's Date: 11/12/2023  END OF SESSION:  PT End of Session - 11/12/23 1103     Visit Number 5    Date for PT Re-Evaluation 12/23/23    Authorization Type [employer]    Authorization Time Period AUTH pending    Authorization - Visit Number 4    Progress Note Due on Visit 10    PT Start Time 1013    PT Stop Time 1100    PT Time Calculation (min) 47 min    Activity Tolerance Patient tolerated treatment well    Behavior During Therapy WFL for tasks assessed/performed                 Past Medical History:  Diagnosis Date   Allergy -induced asthma    Allergy  induced asthma   Anxiety    Bronchitis    Cancer (HCC)    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 EF 40-45% in 2016, varying by several echoes. F/u echo 06/2015 showed EF 50%.    PONV (postoperative nausea and vomiting)    Sinus tachycardia    a. 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 b. 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   Past Surgical History:  Procedure Laterality Date   ABDOMINAL HYSTERECTOMY     CESAREAN SECTION  03-01-2008  and 2016   IM PINNING RIGHT ELBOW FX  1992   HARDWARE REMOVED   LAPAROSCOPIC ASSISTED VAGINAL HYSTERECTOMY Left 02/04/2017   Procedure: LAPAROSCOPIC ASSISTED VAGINAL HYSTERECTOMY, LEFT SALPINGOOPHORECTOMY;  Surgeon: Thurman Flores, MD;  Location: [facility] ;  Service: Gynecology;  Laterality: Left;  NEED BED   LAPAROSCOPY N/A 07/29/2014   Procedure: LAPAROSCOPY DIAGNOSTIC;   Surgeon: Martine Sleek, MD;  Location: [facility];  Service: Gynecology;  Laterality: N/A;   OOPHORECTOMY Left 02/04/2017   Procedure: LEFT OOPHORECTOMY;  Surgeon: Thurman Flores, MD;  Location: [facility];  Service: Gynecology;  Laterality: Left;   Patient Active Problem List   Diagnosis Date Noted   Routine general medical examination at a health care facility 09/22/2023   Viral URI with cough 07/08/2023   Fatigue 03/13/2023   History of hysterectomy 03/13/2023   Pruritus 11/05/2022   Pruritic rash 11/05/2022   Allergic conjunctivitis of both eyes 11/05/2022   Mixed hyperlipidemia 09/05/2022   COVID-19 03/26/2022   Flank pain 02/04/2022   Chronic back pain 01/31/2022   Stress incontinence 09/04/2021   Laryngitis 08/30/2021   Right lower quadrant abdominal pain 08/23/2021   Urticaria 12/02/2019   Seasonal and perennial allergic rhinoconjunctivitis 12/02/2019   Morbid obesity (HCC) 11/11/2016   Anxiety state 12/19/2015   Essential hypertension 10/09/2015   CHF (congestive heart failure), NYHA class I, chronic, systolic (HCC) 10/09/2015   GERD (gastroesophageal reflux disease)     PCP: Odette Benjamin, NP  REFERRING PROVIDER: Donnamarie Gables, MD  REFERRING DIAG: [phone] (ICD-10-CM) - Left Achilles tendinitis   THERAPY DIAG:  Stiffness of left ankle,  not elsewhere classified  Pain in left ankle and joints of left foot  Muscle weakness (generalized)  Difficulty in walking, not elsewhere classified  RATIONALE FOR EVALUATION AND TREATMENT: Rehabilitation  ONSET DATE: ~ About 8 months ago  NEXT MD VISIT:  11/17/23   SUBJECTIVE:   SUBJECTIVE STATEMENT:  Pt reports the tape came off the same day again, she notes improvement in sx for a day or two but after that it's back to normal.   Eval: A year ago, doing yard work and fell into a hole that she didn't see. Heard a pop and went to the doctor. Now feels pain on the side and top  of foot. Doctor wants her to try DN and may need to replace tendon with another tendon from somewhere else. Also has back issues, bulging disc.  PERTINENT HISTORY:  GERD, Anxiety, CHF, sinus tachycardia, bulging disc w/ sciatica  PAIN:  Are you having pain? Yes: NPRS scale: 6.5/10 currently, 10/10 when standing on it and feels like it gives out Pain location: L Achilles, heel, and dorsum of foot Pain description: sharp,achy when sitting or sharp when standing Aggravating factors: Standing, dangling foot, sitting, walking, stairs, basic activity and depends on day Relieving factors: Hasn't found anything to relieve, rests  PRECAUTIONS: None  RED FLAGS: None   WEIGHT BEARING RESTRICTIONS: No  FALLS:  Has patient fallen in last 6 months? 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  DIAGNOSTIC FINDINGS:  N/A  PATIENT SURVEYS:  LEFS 57 / 80 = 71.3 %  COGNITION: Overall cognitive status: Within functional limits for tasks assessed     SENSATION: WFL  EDEMA:  Figure 8: R = 56 cm, L = 55 cm (in ankle brace prior to measurement); swelling around medial & lateral malleoli   MUSCLE LENGTH: Hamstrings: mod/severe tight L (limited by LBP), mild tight R ITB: Mild tightness Piriformis:  Hip flexors: Mild tightness Quads:  Heelcord: Mod tight  POSTURE: rounded shoulders, forward head, and flexed trunk   PALPATION: TTP over plantar surface of heel through  Achilles tendon up to musculotendinous junction at gastroc  LOWER EXTREMITY ROM:  A/PROM Right eval Left eval  Ankle dorsiflexion 0 / 6 0 p! / 4 p  Ankle plantarflexion 60 55 p!  Ankle inversion 28 32 ^  Ankle eversion 22 12 ^   (Blank rows = not tested, p = dull pain, p! = sharp pain, ^ = stretch)  LOWER EXTREMITY MMT:  MMT Right eval Left eval  Hip flexion 4 4-  Hip extension 4+ 4-  Hip abduction 4 4-  Hip adduction 4+ 4-  Hip internal rotation 3+ 3+  Hip external rotation 3+ 3+  Knee flexion    Knee extension    Ankle dorsiflexion 5 4 p!  Ankle plantarflexion    Ankle inversion 5 4  Ankle eversion 5 4   (Blank rows = not tested, p! = dull pain)  TREATMENT DATE:  11/12/23 Nustep L5x35min UE/LE L runner stretch 2x30"  Standing marching 2lb x 10 B Standing hip abduction 2lb x 10 B Standing hip extension 2lb x 10 B Mini squats 10x3" B Seated ball squeeze +LAQ x 10 B Ultrasound: 3.3 MHz continuous 1.5 W/cm2 to thickened portion of L achilles tendon x 8 min  11/06/23:  Therapeutic activity: Utilized Nustep for 5 min for tissue perfusion prior to manual techniques and stretching. Manual stretching, gentle L ankle dorsiflexion, ev, inv  Manual: Supine for A/P glides R subtalar jt,combined with L ankle dorsiflexion stretch,  also general jt mobs L mid foot , navicular Prone for retrograde massage to relieve edema L ankle, also myofascial release with blade gastroc and soleus musculature  Trigger Point Dry Needling  Subsequent Treatment: Instructions provided previously at initial dry needling treatment.   Patient Verbal Consent Given: Yes Education Handout Provided: Yes Muscles Treated: L soleus, 2 pts, L gastroc medially Electrical Stimulation Performed: No Treatment Response/Outcome: twitch response , decreased tissue resistance Kinesiotaping  L foot/lower leg, 2 -I pieces to support the achilles, plantarflexor musculature  No new ex instruction today, reviewed verbally the isometrics as shown previously   11/03/23 Therapeutic Exercise: to improve strength, ROM, flexibility, and endurance  Nustep L4x31min Isometric PF, EV, IV, DF 5x5" Long sitting gastroc and soleus stretches 2x30" Toe curls 2x10 Toe spreads x 10 Toe yoga x 10  Manual Therapy: to decrease muscle spasm, pain and improve mobility.  STM to L gastroc, cross friction to achilles tendon  10/30/23:  Manual:  Palpated thickened tissue mid achilles, tender, very tender and edematous achilles insertion Trigger Point Dry Needling  Initial Treatment: Pt instructed on Dry Needling rational, procedures, and possible side effects. Pt instructed to expect mild to moderate muscle soreness later in the day and/or into the next day.  Pt instructed in methods to reduce muscle soreness. Pt instructed to continue prescribed HEP. Patient was educated on signs and symptoms of infection and other risk factors and advised to seek medical attention should they occur.  Patient verbalized understanding of these instructions and education.   Patient Verbal Consent Given: Yes Education Handout Provided: Previously Provided Muscles Treated: L gastrocs,L soleus Electrical Stimulation Performed: No Treatment Response/Outcome: twitch lateral gastrocs and soleus  Light retrograde massage for edema reduction Kinesiotaping L ankle plantar surface L foot, extending to proximal med and lat gastrocs, 35% pull  Therex: instructed in isometric training for L ankle pflexors and inversion with 6" playground ball for resistance, rpovided with additional written inst.    10/28/23 THERAPEUTIC EXERCISE: To improve endurance, ROM, and flexibility.  Demonstration, verbal and tactile cues throughout for technique. Seated Calf Stretch w/ strap Seated soleus stretch w/ strap  PATIENT EDUCATION:   Education details: anticipated POC, initial HEP Person educated: Patient Education method: Programmer, multimedia, Demonstration, Verbal cues, and Handouts Education comprehension: verbalized understanding, returned demonstration, verbal cues required, and needs further education  HOME EXERCISE PROGRAM: Access Code: [email or web address] URL: [email or web address] Date: 11/12/2023 Prepared by: Dovie Gell  Exercises - Long Sitting Isometric Ankle Plantarflexion with Celeste Cola at [employer]  - 1 x daily - 7 x weekly - 1 sets - 10 reps - Isometric Ankle Inversion at Wall  - 1 x daily - 7 x weekly - 1 sets - 10 reps - Gastroc Stretch on Wall  - 1 x daily - 7 x weekly - 2 sets - 2 reps - 30 sec hold Access Code: [email or web address] URL: [email or web address] Date: 10/28/2023 Prepared by: Talia Joseph-Greene  Exercises - Seated Calf Stretch with Strap (Mirrored)  - 1 x daily - 7 x weekly - 3 reps - 30 sec hold - Seated Soleus Stretch with Strap  - 1 x daily - 7 x weekly - 3 reps - 30 sec hold  ASSESSMENT:  CLINICAL IMPRESSION:  Focused on proximal strengthening today, with cues provided to correct her form. More R sided hip weakness noted today with exercises. Therapeutic ultrasound done to L achilles tendon for tissue healing and to decrease pain. Decreased pain overall after treatment today.   Noretta Bears Eire is a 38 y.o. female who was seen today for physical therapy evaluation and treatment for L Achilles Tendinitis. Reported that her L achilles pain started about 8 months after a fall in the yard. Mikhia has show limitation and impaired muscle tension in LE  due to ankle and back pain. She has a bulging disc with sciatica that radiates down her L leg, tending to favor her R side to alleviate the pain. She demonstrates R weight shift to avoid putting pressure on L ankle and due to her L sciatica. She also has clicking in her L anterior tib. Movement during the exam increased her pain in the back with  shooting pain down her leg. The pre-existing condition and the L achilles tendinitis impacts her ability to walk, play with her kids and interferes with ADLs. Her goal is to get back to walking and have decreased pain in her L ankle. Aneka will benefit from skilled PT to address above deficits to improve mobility, activity tolerance, quality of life with decreased pain interference.  OBJECTIVE IMPAIRMENTS: Abnormal gait, decreased activity tolerance, decreased balance, decreased endurance, decreased knowledge of condition, decreased mobility, difficulty walking, decreased ROM, decreased strength, increased edema, increased fascial restrictions, impaired perceived functional ability, increased muscle spasms, impaired flexibility, impaired sensation, impaired tone, improper body mechanics, postural dysfunction, and pain.   ACTIVITY LIMITATIONS: carrying, lifting, sitting, standing, squatting, stairs, bed mobility, locomotion level, and caring for others  PARTICIPATION LIMITATIONS: meal prep, cleaning, laundry, interpersonal relationship, driving, community activity, occupation, and yard work  PERSONAL FACTORS: Past/current experiences, Time since onset of injury/illness/exacerbation, and 3+ comorbidities:  bulging disc w/ sciatica, GERD, Anxiety, CHF, sinus tachycardia  are also affecting patient's functional outcome.   REHAB POTENTIAL: Good  CLINICAL DECISION MAKING: Evolving/moderate complexity  EVALUATION COMPLEXITY: Moderate   GOALS: Goals reviewed with patient? Yes  SHORT TERM GOALS: Target date: 11/25/2023  1.  Pt will be independent with initial HEP Baseline:  Goal status: INITIAL  2.  Patient will report >/= 61/80 on LEFS (MCID = 9 pts) to demonstrate improved functional ability.  Baseline: 57 / 80 = 71.3 % Goal status: INITIAL  3.  Pt will report 25% improvement in L achilles pain to improve QoL Baseline: 6.5/10 currently, 10/10 when standing Goal status: INITIAL  4.  Pt will  improve 5 of B ankle DF for ease of mobility Baseline: Refer to ROM table Goal status: INITIAL    LONG TERM GOALS: Target date: 12/23/2023   1.  Pt will report 50-75% improvement in L achilles pain Baseline: 6.5/10 currently, 10/10 when standing Goal status: INITIAL   2.  Pt will improve B LE strength to >=4+/5 for improved stability and ease of mobility Baseline: Refer to MMT table Goal status: INITIAL   3.  Patient will report >/= 66/80 on LEFS (MCID = 9 pts) to demonstrate improved functional ability.  Baseline: 57 / 80 = 71.3 % Goal status: INITIAL  4.  Patient will demonstrate improved R ankle DF AROM to WNL to allow for normal gait and stair mechanics.  Baseline: Refer to ROM table Goal status: INITIAL  5.  Patient will be independent with advanced/ongoing HEP to improve outcomes and carryover.  Baseline:  Goal status: INITIAL  6.  Pt will be able to stand and sit for 1 hour without increased L heel pain for increase performance for housework Baseline: Unable to sit or stand for 1 hour Goal status: INITIAL  7.  Pt will be able to ambulate 600" with normalized gait pattern for safe community mobility Baseline: Mod difficulty to walk - LEFS Goal status: INITIAL    PLAN:  PT FREQUENCY: 2x/week  PT DURATION: 8 weeks  PLANNED INTERVENTIONS: 97164- PT Re-evaluation, 97750- Physical Performance Testing, 97110-Therapeutic exercises, 97530- Therapeutic activity, 97112- Neuromuscular re-education, 97535- Self Care, 16109- Manual therapy, [phone]- Gait training, [phone]- Aquatic Therapy, [phone]- Ultrasound, Patient/Family education, Balance training, Stair training, Taping, Dry Needling, Joint mobilization, Cryotherapy, and Moist heat  PLAN FOR NEXT SESSION: How was ultrasound?,MT +/- TPDN to gastroc, soleus; progress ankle and foot intrinsic strengthening; progress proximal LE strengthening; initiate balance and proprioceptive training; taping to offload achilles    Samuella Crocker, PTA 11/12/2023, 12:13 PM

## 2023-11-13 NOTE — Therapy (Addendum)
 OUTPATIENT PHYSICAL THERAPY TREATMENT / DISCHARGE SUMMARY  Progress Note  Reporting Period 10/28/2023 to 11/14/2023   See note below for Objective Data and Assessment of Progress/Goals.     Patient Name: Michelle Kane MRN: [id number] DOB:1985-11-13, 38 y.o., female Today's Date: 11/14/2023  END OF SESSION:  PT End of Session - 11/14/23 0802     Visit Number 6    Date for PT Re-Evaluation 12/23/23    Authorization Type [facility] Medicaid    Authorization Time Period 10/28/23 - 12/26/23    Authorization - Visit Number 6    Authorization - Number of Visits 6    Progress Note Due on Visit 10    PT Start Time 0802    PT Stop Time 0847    PT Time Calculation (min) 45 min    Activity Tolerance Patient tolerated treatment well    Behavior During Therapy WFL for tasks assessed/performed                  Past Medical History:  Diagnosis Date   Allergy -induced asthma    Allergy  induced asthma   Anxiety    Bronchitis    Cancer (HCC)    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 EF 40-45% in 2016, varying by several echoes. F/u echo 06/2015 showed EF 50%.    PONV (postoperative nausea and vomiting)    Sinus tachycardia    a. 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 b. 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   Past Surgical History:  Procedure Laterality Date   ABDOMINAL HYSTERECTOMY     CESAREAN SECTION  03-01-2008  and 2016   IM PINNING RIGHT ELBOW FX  1992   HARDWARE REMOVED   LAPAROSCOPIC ASSISTED VAGINAL HYSTERECTOMY Left 02/04/2017   Procedure: LAPAROSCOPIC ASSISTED VAGINAL HYSTERECTOMY, LEFT SALPINGOOPHORECTOMY;   Surgeon: Mat Browning, MD;  Location: [facility] [place];  Service: Gynecology;  Laterality: Left;  NEED BED   LAPAROSCOPY N/A 07/29/2014   Procedure: LAPAROSCOPY DIAGNOSTIC;  Surgeon: Browning LITTIE Mat, MD;  Location: [facility];  Service: Gynecology;  Laterality: N/A;   OOPHORECTOMY Left 02/04/2017   Procedure: LEFT OOPHORECTOMY;  Surgeon: Mat Browning, MD;  Location: [facility];  Service: Gynecology;  Laterality: Left;   Patient Active Problem List   Diagnosis Date Noted   Routine general medical examination at a health care facility 09/22/2023   Viral URI with cough 07/08/2023   Fatigue 03/13/2023   History of hysterectomy 03/13/2023   Pruritus 11/05/2022   Pruritic rash 11/05/2022   Allergic conjunctivitis of both eyes 11/05/2022   Mixed hyperlipidemia 09/05/2022   COVID-19 03/26/2022   Flank pain 02/04/2022   Chronic back pain 01/31/2022   Stress incontinence 09/04/2021   Laryngitis 08/30/2021   Right lower quadrant abdominal pain 08/23/2021   Urticaria 12/02/2019   Seasonal and perennial allergic rhinoconjunctivitis 12/02/2019   Morbid obesity (HCC) 11/11/2016   Anxiety state 12/19/2015   Essential hypertension 10/09/2015   CHF (congestive heart failure), NYHA class I, chronic, systolic (HCC) 10/09/2015   GERD (gastroesophageal reflux  disease)     PCP: Nedra Tinnie LABOR, NP  REFERRING PROVIDER: Elsa Lonni SAUNDERS, MD  REFERRING DIAG: [phone] (ICD-10-CM) - Left Achilles tendinitis   THERAPY DIAG:  Stiffness of left ankle, not elsewhere classified  Pain in left ankle and joints of left foot  Muscle weakness (generalized)  Difficulty in walking, not elsewhere classified  RATIONALE FOR EVALUATION AND TREATMENT: Rehabilitation  ONSET DATE: ~ About 8 months ago  NEXT MD VISIT:  11/17/23   SUBJECTIVE:   SUBJECTIVE STATEMENT:  Pt report benefit from the US  during the treatment but noted increased pain later.  Best  relief has been from DN with relief for up to 24-36 hrs.  Pt also reports increased pain yesterday w/o known trigger.    Eval: A year ago, doing yard work and fell into a hole that she didn't see. Heard a pop and went to the doctor. Now feels pain on the side and top of foot. Doctor wants her to try DN and may need to replace tendon with another tendon from somewhere else. Also has back issues, bulging disc.  PERTINENT HISTORY:  GERD, Anxiety, CHF, sinus tachycardia, bulging disc w/ sciatica  PAIN:  Are you having pain? Yes: NPRS scale: 4-5/10 currently  Pain location: L Achilles, heel, and dorsum of foot Pain description: sharp,achy when sitting or sharp when standing Aggravating factors: Standing, dangling foot, sitting, walking, stairs, basic activity and depends on day Relieving factors: Hasn't found anything to relieve, rests  PRECAUTIONS: None  RED FLAGS: None   WEIGHT BEARING RESTRICTIONS: No  FALLS:  Has patient fallen in last 6 months? 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  DIAGNOSTIC FINDINGS:  N/A  PATIENT SURVEYS:  LEFS 57 / 80 = 71.3 %  COGNITION: Overall cognitive status: Within functional limits for tasks assessed     SENSATION: WFL  EDEMA:  Figure 8: R = 56 cm, L = 55 cm (in ankle brace prior to measurement); swelling around medial & lateral malleoli   MUSCLE LENGTH: Hamstrings: mod/severe tight L (limited by LBP), mild tight  R ITB: Mild tightness Piriformis:  Hip flexors: Mild tightness Quads:  Heelcord: Mod tight  POSTURE: rounded shoulders, forward head, and flexed trunk   PALPATION: TTP over plantar surface of heel through Achilles tendon up to musculotendinous junction at gastroc  LOWER EXTREMITY ROM:  A/PROM Right eval Left eval R 11/14/23 L 11/14/23  Ankle dorsiflexion 0 / 6 0 p! / 4 p! 4 8 ^  Ankle plantarflexion 60 55 p!  48 p!  Ankle inversion 28 32 ^  40 p!  Ankle eversion 22 12 ^  12 p!   (Blank rows = not tested, p = dull pain, p! = sharp pain, ^ = stretch)  LOWER EXTREMITY MMT:  MMT Right eval Left eval  Hip flexion 4 4-  Hip extension 4+ 4-  Hip abduction 4 4-  Hip adduction 4+ 4-  Hip internal rotation 3+ 3+  Hip external rotation  3+ 3+  Knee flexion    Knee extension    Ankle dorsiflexion 5 4 p!  Ankle plantarflexion    Ankle inversion 5 4  Ankle eversion 5 4   (Blank rows = not tested, p! = dull pain)                                                                                                                               TREATMENT DATE:   11/14/2023  THERAPEUTIC EXERCISE: To improve strength, endurance, ROM, and flexibility.  Demonstration, verbal and tactile cues throughout for technique.  NuStep - L5 x 6 min UE/LE Seated L figure-4 plantar fascia stretch 2 x 30 Review heelcord stretching options, indicating negative heel stretch or toes on baseboard to add plantar fascia stretch  THERAPEUTIC ACTIVITIES: To improve functional performance.  Demonstration, verbal and tactile cues throughout for technique.  Ankle ROM LEFS: 56 / 80 = 70.0 %  MANUAL THERAPY: To promote normalized muscle tension, improved flexibility, improved joint mobility, increased ROM, and reduced pain. Trigger Point Dry Needling: Treatment instructions/education: Subsequent Treatment: Instructions provided previously at initial dry needling treatment.  Education Handout Provided: Previously  Provided Consent: Patient Verbal Consent Given: Yes Treatment: Muscles Treated: L medial & lateral gastroc, L soleus Skilled palpation and monitoring of soft tissue during DN Electrical Stimulation Performed: No Treatment Response/Outcome: Twitch response elicited, Palpable increase in muscle length, Decreased tissue resistance noted, Decreased TTP, and Improved exercise tolerance STM/DTM, manual TPR and pin & stretch to muscles addressed with DN STM/DTM and XFM to L gastroc/soleus/Achilles musculotendinous junction and L plantar fascia Provided instruction in self-STM techniques to L calf musculature using foam roller or rollerstick - both attempted by pt with pt noting preference for rollerstick/rolling pin.  Provided instruction in self-STM techniques to L plantar fascia using golf ball or small cylindrical object.    11/12/23 Nustep L5x15min UE/LE L runner stretch 2x30  Standing marching 2lb x 10 B Standing hip abduction 2lb x 10 B Standing hip extension 2lb x 10 B Mini squats 10x3 B Seated ball squeeze +LAQ x 10 B Ultrasound: 3.3 MHz continuous 1.5 W/cm2 to thickened portion of L achilles tendon x 8 min   11/06/23:  Therapeutic activity: Utilized Nustep for 5 min for tissue perfusion prior to manual techniques and stretching. Manual stretching, gentle L ankle dorsiflexion, ev, inv  Manual: Supine for A/P glides R subtalar jt,combined with L ankle dorsiflexion stretch,  also general jt mobs L mid foot , navicular Prone for retrograde massage to relieve edema L ankle, also myofascial release with blade gastroc and soleus musculature  Trigger Point Dry Needling  Subsequent Treatment: Instructions provided previously at initial dry needling treatment.   Patient Verbal Consent Given: Yes Education Handout Provided: Yes Muscles Treated: L soleus, 2 pts, L gastroc medially Electrical Stimulation Performed: No Treatment Response/Outcome: twitch response , decreased tissue  resistance Kinesiotaping L foot/lower leg, 2 -I pieces to support the achilles,  plantarflexor musculature  No new ex instruction today, reviewed verbally the isometrics as shown previously    PATIENT EDUCATION:  Education details: HEP review, self-STM techniques to calf using FR or rolling pin, role of DN, and DN rational, procedure, outcomes, potential side effects, and recommended post-treatment exercises/activity  Person educated: Patient Education method: Explanation, Demonstration, Verbal cues, and Handouts Education comprehension: verbalized understanding, returned demonstration, verbal cues required, and needs further education  HOME EXERCISE PROGRAM: Access Code: [email or web address] URL: [email or web address] Date: 11/12/2023 Prepared by: Sol Gaskins  Exercises - Long Sitting Isometric Ankle Plantarflexion with [place] at [employer]  - 1 x daily - 7 x weekly - 1 sets - 10 reps - Isometric Ankle Inversion at Wall  - 1 x daily - 7 x weekly - 1 sets - 10 reps - Gastroc Stretch on Wall  - 1 x daily - 7 x weekly - 2 sets - 2 reps - 30 sec hold   ASSESSMENT:  CLINICAL IMPRESSION:  Zamarah reports her pain continues to fluctuate with minimal overall change noted with PT thus far.   Some days are better than others with the best relief so far coming from the TPDN which gives her relief from pain for up to 24-36 hrs. LEFS remains essentially unchanged from eval with current score of 56/80 still indicating mild to moderate functional limitation.  L ankle DF and inversion ROM improving with PF and eversion essentially unchanged, however increased pain reported with all but DF (only stretch noted).  MT performed today incorporating TPDN with strong twitch responses elicited resulting in palpable reduction in muscle tension and TTP.  Educated patient in use of foam roller and/or rollerstick to continue to reduce muscle tension.  She reports no issues with her HEP but we did review stretching options  to add plantar fascia stretch to heelcord stretching as she has been noting inferior heel pain at calcaneal tubercle and increased crepitus palpated in plantar fascia.  Patient also educated is self-STM techniques for plantar fascia using golf ball or small cylindrical object.  Despite limited change in pain, Vanna is progressing with her functional ROM at her ankle and has met 2 of her STGs.  She will benefit from continued skilled PT to address ongoing deficits to improve mobility and activity tolerance with decreased pain interference.    EvalDianah Pruett is a 39 y.o. female who was seen today for physical therapy evaluation and treatment for L Achilles Tendinitis. Reported that her L achilles pain started about 8 months after a fall in the yard. Abcde has show limitation and impaired muscle tension in LE  due to ankle and back pain. She has a bulging disc with sciatica that radiates down her L leg, tending to favor her R side to alleviate the pain. She demonstrates R weight shift to avoid putting pressure on L ankle and due to her L sciatica. She also has clicking in her L anterior tib. Movement during the exam increased her pain in the back with shooting pain down her leg. The pre-existing condition and the L achilles tendinitis impacts her ability to walk, play with her kids and interferes with ADLs. Her goal is to get back to walking and have decreased pain in her L ankle. Antonieta will benefit from skilled PT to address above deficits to improve mobility, activity tolerance, quality of life with decreased pain interference.  OBJECTIVE IMPAIRMENTS: Abnormal gait, decreased activity tolerance, decreased balance, decreased endurance, decreased knowledge of condition, decreased mobility, difficulty walking, decreased ROM, decreased  strength, increased edema, increased fascial restrictions, impaired perceived functional ability, increased muscle spasms, impaired flexibility, impaired sensation, impaired tone,  improper body mechanics, postural dysfunction, and pain.   ACTIVITY LIMITATIONS: carrying, lifting, sitting, standing, squatting, stairs, bed mobility, locomotion level, and caring for others  PARTICIPATION LIMITATIONS: meal prep, cleaning, laundry, interpersonal relationship, driving, community activity, occupation, and yard work  PERSONAL FACTORS: Past/current experiences, Time since onset of injury/illness/exacerbation, and 3+ comorbidities:  bulging disc w/ sciatica, GERD, Anxiety, CHF, sinus tachycardia are also affecting patient's functional outcome.   REHAB POTENTIAL: Good  CLINICAL DECISION MAKING: Evolving/moderate complexity  EVALUATION COMPLEXITY: Moderate   GOALS: Goals reviewed with patient? Yes  SHORT TERM GOALS: Target date: 11/25/2023  1.  Pt will be independent with initial HEP Baseline:  Goal status: MET - 11/14/23   2.  Patient will report >/= 61/80 on LEFS (MCID = 9 pts) to demonstrate improved functional ability.  Baseline: 57 / 80 = 71.3 % Goal status: IN PROGRESS - 56 / 80 = 70.0 %  3.  Pt will report 25% improvement in L achilles pain to improve QoL Baseline: 6.5/10 currently, 10/10 when standing Goal status: IN PROGRESS - 11/14/23 - pain fluctuates (some days better than others) but overall unchanged  4.  Pt will improve 5 of B ankle DF for ease of mobility Baseline: Refer to ROM table Goal status: MET - 11/14/23 - L ankle DF 8    LONG TERM GOALS: Target date: 12/23/2023   1.  Pt will report 50-75% improvement in L achilles pain Baseline: 6.5/10 currently, 10/10 when standing Goal status: IN PROGRESS   2.  Pt will improve B LE strength to >=4+/5 for improved stability and ease of mobility Baseline: Refer to MMT table Goal status: IN PROGRESS   3.  Patient will report >/= 66/80 on LEFS (MCID = 9 pts) to demonstrate improved functional ability.  Baseline: 57 / 80 = 71.3 % Goal status: IN PROGRESS - 11/14/23 - 56 / 80 = 70.0 %  4.  Patient will  demonstrate improved R ankle DF AROM to WNL to allow for normal gait and stair mechanics.  Baseline: Refer to ROM table Goal status: IN PROGRESS  5.  Patient will be independent with advanced/ongoing HEP to improve outcomes and carryover.  Baseline:  Goal status: IN PROGRESS  6.  Pt will be able to stand and sit for 1 hour without increased L heel pain for increase performance for housework Baseline: Unable to sit or stand for 1 hour Goal status: IN PROGRESS  7.  Pt will be able to ambulate 600' with normalized gait pattern for safe community mobility Baseline: Mod difficulty to walk - LEFS Goal status: IN PROGRESS    PLAN:  PT FREQUENCY: 2x/week  PT DURATION: 8 weeks  PLANNED INTERVENTIONS: 97164- PT Re-evaluation, 97750- Physical Performance Testing, 97110-Therapeutic exercises, 97530- Therapeutic activity, 97112- Neuromuscular re-education, 97535- Self Care, 02859- Manual therapy, [phone]- Gait training, [phone]- Aquatic Therapy, [phone]- Ultrasound, Patient/Family education, Balance training, Stair training, Taping, Dry Needling, Joint mobilization, Cryotherapy, and Moist heat  PLAN FOR NEXT SESSION: MT +/- TPDN to gastroc, soleus; progress ankle and foot intrinsic strengthening; progress proximal LE strengthening; initiate balance and proprioceptive training; taping to offload achilles    Elijah CHRISTELLA Hidden, PT 11/14/2023, 8:49 AM   PHYSICAL THERAPY DISCHARGE SUMMARY  Visits from Start of Care: 6  Current functional level related to goals / functional outcomes: Refer to above clinical impression and goal assessment for status as of  last visit on 11/14/2023. Patient called to cancel any further appointment stating MD wanted her to stop PT, therefore will proceed with discharge from PT for this episode.     Remaining deficits: As above.   Education / Equipment: HEP   Patient agrees to discharge. Patient goals were partially met. Patient is being discharged due to the physician's  request.  Chibueze Beasley M. Aaryana Betke, PT 02/18/2024, 11:39 AM  [facility] [address]  [address] [place], [place], [address] Phone: [phone]   Fax:  [phone]

## 2023-11-14 ENCOUNTER — Encounter: Payer: Self-pay | Admitting: Physical Therapy

## 2023-11-14 ENCOUNTER — Ambulatory Visit: Attending: Orthopaedic Surgery | Admitting: Physical Therapy

## 2023-11-14 DIAGNOSIS — M25672 Stiffness of left ankle, not elsewhere classified: Secondary | ICD-10-CM | POA: Insufficient documentation

## 2023-11-14 DIAGNOSIS — M25572 Pain in left ankle and joints of left foot: Secondary | ICD-10-CM | POA: Diagnosis not present

## 2023-11-14 DIAGNOSIS — R262 Difficulty in walking, not elsewhere classified: Secondary | ICD-10-CM | POA: Diagnosis not present

## 2023-11-14 DIAGNOSIS — M6281 Muscle weakness (generalized): Secondary | ICD-10-CM | POA: Insufficient documentation

## 2023-11-17 ENCOUNTER — Ambulatory Visit

## 2023-11-17 DIAGNOSIS — M7662 Achilles tendinitis, left leg: Secondary | ICD-10-CM | POA: Diagnosis not present

## 2023-11-19 ENCOUNTER — Ambulatory Visit

## 2023-11-24 ENCOUNTER — Encounter: Admitting: Physical Therapy

## 2023-11-26 ENCOUNTER — Encounter: Admitting: Physical Therapy

## 2023-12-17 DIAGNOSIS — M7662 Achilles tendinitis, left leg: Secondary | ICD-10-CM | POA: Diagnosis not present

## 2023-12-24 ENCOUNTER — Encounter: Payer: Self-pay | Admitting: Nurse Practitioner

## 2023-12-24 ENCOUNTER — Ambulatory Visit: Admitting: Nurse Practitioner

## 2023-12-24 ENCOUNTER — Other Ambulatory Visit (HOSPITAL_BASED_OUTPATIENT_CLINIC_OR_DEPARTMENT_OTHER): Payer: Self-pay

## 2023-12-24 VITALS — BP 130/80 | HR 89 | Temp 96.8°F | Ht 67.0 in | Wt 280.8 lb

## 2023-12-24 DIAGNOSIS — G8929 Other chronic pain: Secondary | ICD-10-CM | POA: Diagnosis not present

## 2023-12-24 DIAGNOSIS — K219 Gastro-esophageal reflux disease without esophagitis: Secondary | ICD-10-CM | POA: Diagnosis not present

## 2023-12-24 DIAGNOSIS — M25572 Pain in left ankle and joints of left foot: Secondary | ICD-10-CM

## 2023-12-24 MED ORDER — WEGOVY 1 MG/0.5ML ~~LOC~~ SOAJ
1.0000 mg | SUBCUTANEOUS | 1 refills | Status: DC
  Filled 2023-12-24: qty 2, 28d supply, fill #0
  Filled 2024-01-15 – 2024-02-12 (×4): qty 2, 28d supply, fill #1

## 2023-12-24 NOTE — Assessment & Plan Note (Signed)
 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 She should continue using the boot intermittently continue collaboration and recommendations for podiatrist.

## 2023-12-24 NOTE — Assessment & Plan Note (Signed)
 Chronic, ongoing. Chronic GERD with a 2 mm hiatal hernia is less effectively managed with Protonix  40 mg once daily. Significant reflux is managed with dietary modifications. Consult GI for further evaluation and potential medication adjustment. Continue dietary modifications.

## 2023-12-24 NOTE — Progress Notes (Signed)
 Established Patient Office Visit  Subjective   Patient ID: Michelle Kane, female    DOB: Sep 02, 1985  Age: 38 y.o. MRN: [id number]  Chief Complaint  Patient presents with   Weight Check    No concerns     HPI Discussed the use of AI scribe software for clinical note transcription with the patient, who gave verbal consent to proceed.  History of Present Illness   Michelle Kane is a 38 year old female who presents for follow-up on her ankle and weight management.  She has Achilles tendon damage from a sprain over a year ago. Previous treatments were ineffective, and she recently transitioned out of a boot on June 4th. She manages the condition with anti-inflammatory medication and uses the boot intermittently for flare-ups. Surgery is anticipated but delayed.  She is on Wegovy  for weight management and has lost four pounds since the last visit, despite a setback from steroid use for her ankle. She is ready to increase her Wegovy  dose to one mg and has not experienced significant side effects this time.  She has acid reflux for nearly 20 years, taking Protonix  40 mg daily and Pepcid . The Protonix  is becoming less effective, and she experiences reflux primarily at night. Dietary adjustments include avoiding red sauces and monitoring lettuce intake. She focuses on protein intake and avoids fried foods and cheese, except for occasional mozzarella in salads.       ROS See pertinent positives and negatives per HPI.    Objective:     BP 130/80 (BP Location: Left Arm, Patient Position: Sitting)   Pulse 89   Temp (!) 96.8 F (36 C) (Temporal)   Ht 5' 7 (1.702 m)   Wt 280 lb 12.8 oz (127.4 kg)   LMP 12/09/2016   SpO2 93%   BMI 43.98 kg/m  BP Readings from Last 3 Encounters:  12/24/23 130/80  09/22/23 116/82  07/08/23 126/78   Wt Readings from Last 3 Encounters:  12/24/23 280 lb 12.8 oz (127.4 kg)  09/22/23 284 lb (128.8 kg)  07/08/23 290 lb 3.2 oz (131.6 kg)       Physical Exam Vitals and nursing note reviewed.  Constitutional:      General: She is not in acute distress.    Appearance: Normal appearance.  HENT:     Head: Normocephalic.  Eyes:     Conjunctiva/sclera: Conjunctivae normal.  Cardiovascular:     Rate and Rhythm: Normal rate and regular rhythm.     Pulses: Normal pulses.     Heart sounds: Normal heart sounds.  Pulmonary:     Effort: Pulmonary effort is normal.     Breath sounds: Normal breath sounds.  Musculoskeletal:     Cervical back: Normal range of motion.  Skin:    General: Skin is warm.  Neurological:     General: No focal deficit present.     Mental Status: She is alert and oriented to person, place, and time.  Psychiatric:        Mood and Affect: Mood normal.        Behavior: Behavior normal.        Thought Content: Thought content normal.        Judgment: Judgment normal.      Assessment & Plan:   Problem List Items Addressed This Visit       Digestive   GERD (gastroesophageal reflux disease) - Primary   Chronic, ongoing. Chronic GERD with a 2 mm hiatal hernia is less  effectively managed with Protonix  40 mg once daily. Significant reflux is managed with dietary modifications. Consult GI for further evaluation and potential medication adjustment. Continue dietary modifications.         Other   Morbid obesity (HCC)   BMI 43.9. She is currently on Wegovy  with successful weight loss and no significant side effects. She is ready to increase the dose to 1 mg. Increase Wegovy  to 1 mg injection weekly and monitor for gastrointestinal side effects. Follow up in three months and consider a dose increase after four weeks if needed. Continue one-a-day multivitamin and probiotic.      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  She should continue using the boot intermittently continue collaboration and recommendations for podiatrist.       Relevant Medications   meloxicam (MOBIC) 15 MG tablet    Return in about 3 months (around 03/25/2024) for weight management .    Odette Benjamin, NP

## 2023-12-24 NOTE — Patient Instructions (Signed)
 It was great to see you!  Let's increase your wegovy  to 1mg  injection weekly  Keep up the great work!   Let's follow-up in 3 months, sooner if you have concerns.  If a referral was placed today, you will be contacted for an appointment. Please note that routine referrals can sometimes take up to 3-4 weeks to process. Please call our office if you haven't heard anything after this time frame.  Take care,  Rheba Cedar, NP

## 2023-12-24 NOTE — Assessment & Plan Note (Signed)
 BMI 43.9. She is currently on Wegovy  with successful weight loss and no significant side effects. She is ready to increase the dose to 1 mg. Increase Wegovy  to 1 mg injection weekly and monitor for gastrointestinal side effects. Follow up in three months and consider a dose increase after four weeks if needed. Continue one-a-day multivitamin and probiotic.

## 2024-01-15 ENCOUNTER — Telehealth: Payer: Self-pay

## 2024-01-15 ENCOUNTER — Other Ambulatory Visit (HOSPITAL_COMMUNITY): Payer: Self-pay

## 2024-01-15 ENCOUNTER — Other Ambulatory Visit (HOSPITAL_BASED_OUTPATIENT_CLINIC_OR_DEPARTMENT_OTHER): Payer: Self-pay

## 2024-01-15 NOTE — Telephone Encounter (Signed)
 Pharmacy Patient Advocate Encounter   Received notification from CoverMyMeds that prior authorization for Wegovy  1 is required/requested.   Insurance verification completed.   The patient is insured through [facility] .   Per test claim: PA required; PA submitted to above mentioned insurance via CoverMyMeds Key/confirmation #/EOC [id number] Status is pending

## 2024-01-19 ENCOUNTER — Other Ambulatory Visit (HOSPITAL_BASED_OUTPATIENT_CLINIC_OR_DEPARTMENT_OTHER): Payer: Self-pay

## 2024-01-20 ENCOUNTER — Other Ambulatory Visit (HOSPITAL_BASED_OUTPATIENT_CLINIC_OR_DEPARTMENT_OTHER): Payer: Self-pay

## 2024-01-21 ENCOUNTER — Other Ambulatory Visit (HOSPITAL_BASED_OUTPATIENT_CLINIC_OR_DEPARTMENT_OTHER): Payer: Self-pay

## 2024-01-21 NOTE — Telephone Encounter (Signed)
 Pharmacy Patient Advocate Encounter  Received notification from [facility] that Prior Authorization for Wegovy  1MG /0.5ML auto-injectors has been DENIED.  Full denial letter will be uploaded to the media tab. See denial reason below.   PA #/Case ID/Reference #: [id number]

## 2024-01-21 NOTE — Telephone Encounter (Signed)
 Awaiting upload for denial letter.

## 2024-01-22 ENCOUNTER — Other Ambulatory Visit (HOSPITAL_BASED_OUTPATIENT_CLINIC_OR_DEPARTMENT_OTHER): Payer: Self-pay

## 2024-01-22 NOTE — Telephone Encounter (Signed)
 I called [employer] and asked for fax number to fax appeal to and appeal was faxed to [phone].

## 2024-01-23 ENCOUNTER — Other Ambulatory Visit (HOSPITAL_BASED_OUTPATIENT_CLINIC_OR_DEPARTMENT_OTHER): Payer: Self-pay

## 2024-01-26 ENCOUNTER — Other Ambulatory Visit (HOSPITAL_BASED_OUTPATIENT_CLINIC_OR_DEPARTMENT_OTHER): Payer: Self-pay

## 2024-01-27 ENCOUNTER — Other Ambulatory Visit (HOSPITAL_BASED_OUTPATIENT_CLINIC_OR_DEPARTMENT_OTHER): Payer: Self-pay

## 2024-01-29 ENCOUNTER — Other Ambulatory Visit (HOSPITAL_BASED_OUTPATIENT_CLINIC_OR_DEPARTMENT_OTHER): Payer: Self-pay

## 2024-01-30 ENCOUNTER — Other Ambulatory Visit (HOSPITAL_BASED_OUTPATIENT_CLINIC_OR_DEPARTMENT_OTHER): Payer: Self-pay

## 2024-01-31 ENCOUNTER — Other Ambulatory Visit (HOSPITAL_BASED_OUTPATIENT_CLINIC_OR_DEPARTMENT_OTHER): Payer: Self-pay

## 2024-02-02 ENCOUNTER — Encounter: Payer: Self-pay | Admitting: Nurse Practitioner

## 2024-02-02 ENCOUNTER — Other Ambulatory Visit (HOSPITAL_BASED_OUTPATIENT_CLINIC_OR_DEPARTMENT_OTHER): Payer: Self-pay

## 2024-02-03 ENCOUNTER — Other Ambulatory Visit (HOSPITAL_BASED_OUTPATIENT_CLINIC_OR_DEPARTMENT_OTHER): Payer: Self-pay

## 2024-02-03 NOTE — Telephone Encounter (Signed)
 Noted

## 2024-02-04 ENCOUNTER — Other Ambulatory Visit (HOSPITAL_BASED_OUTPATIENT_CLINIC_OR_DEPARTMENT_OTHER): Payer: Self-pay

## 2024-02-05 ENCOUNTER — Other Ambulatory Visit (HOSPITAL_BASED_OUTPATIENT_CLINIC_OR_DEPARTMENT_OTHER): Payer: Self-pay

## 2024-02-05 NOTE — Telephone Encounter (Signed)
 Left message for patient to return call.

## 2024-02-06 ENCOUNTER — Other Ambulatory Visit (HOSPITAL_BASED_OUTPATIENT_CLINIC_OR_DEPARTMENT_OTHER): Payer: Self-pay

## 2024-02-12 ENCOUNTER — Other Ambulatory Visit (HOSPITAL_BASED_OUTPATIENT_CLINIC_OR_DEPARTMENT_OTHER): Payer: Self-pay

## 2024-02-26 ENCOUNTER — Encounter: Payer: Self-pay | Admitting: Nurse Practitioner

## 2024-02-26 ENCOUNTER — Ambulatory Visit: Admitting: Nurse Practitioner

## 2024-02-26 VITALS — BP 132/88 | HR 81 | Temp 98.1°F | Ht 67.0 in | Wt 284.2 lb

## 2024-02-26 DIAGNOSIS — J029 Acute pharyngitis, unspecified: Secondary | ICD-10-CM

## 2024-02-26 DIAGNOSIS — H6690 Otitis media, unspecified, unspecified ear: Secondary | ICD-10-CM | POA: Diagnosis not present

## 2024-02-26 LAB — POCT RAPID STREP A (OFFICE): Rapid Strep A Screen: NEGATIVE

## 2024-02-26 LAB — POC COVID19 BINAXNOW: SARS Coronavirus 2 Ag: NEGATIVE

## 2024-02-26 MED ORDER — AZITHROMYCIN 250 MG PO TABS: ORAL_TABLET | ORAL | 0 refills | Status: AC

## 2024-02-26 NOTE — Progress Notes (Signed)
 Acute Office Visit  Subjective:     Patient ID: Michelle Kane, female    DOB: 20-Feb-1986, 38 y.o.   MRN: [id number]  Chief Complaint  Patient presents with   Sore Throat    With voice loss, neck pain and bilateral ear pain for 3 days, hot and cold chills    HPI Discussed the use of AI scribe software for clinical note transcription with the patient, who gave verbal consent to proceed.  History of Present Illness   Michelle Kane is a 38 year old female who presents with loss of voice and sore throat.  Loss of voice began on Tuesday and has occurred multiple times in the past. Each episode typically lasts about a week. Sore throat also started on Tuesday, with worsening pain over the first two days.  Additional symptoms include right ear pain and pressure, nasal congestion, and alternating hot and cold sensations with current heavy sweating. Minimal coughing is present.  She manages symptoms with over-the-counter medications, adequate hydration, and hot teas. She works Monday through Friday and sought medical attention due to work requirements. No recent illness in her contacts.      ROS See pertinent positives and negatives per HPI.     Objective:    BP 132/88 (BP Location: Left Arm, Patient Position: Sitting, Cuff Size: Large)   Pulse 81   Temp 98.1 F (36.7 C) (Oral)   Ht 5' 7 (1.702 m)   Wt 284 lb 3.2 oz (128.9 kg)   LMP 12/09/2016   SpO2 98%   BMI 44.51 kg/m    Physical Exam Vitals and nursing note reviewed.  Constitutional:      General: She is not in acute distress.    Appearance: Normal appearance.  HENT:     Head: Normocephalic.     Right Ear: External ear normal. Tympanic membrane is erythematous.     Left Ear: Tympanic membrane, ear canal and external ear normal.     Mouth/Throat:     Mouth: Mucous membranes are moist.     Pharynx: Posterior oropharyngeal erythema present. No oropharyngeal exudate.     Comments: layringitis Eyes:      Conjunctiva/sclera: Conjunctivae normal.  Cardiovascular:     Rate and Rhythm: Normal rate and regular rhythm.     Pulses: Normal pulses.     Heart sounds: Normal heart sounds.  Pulmonary:     Effort: Pulmonary effort is normal.     Breath sounds: Normal breath sounds.  Musculoskeletal:     Cervical back: Normal range of motion.  Skin:    General: Skin is warm.  Neurological:     General: No focal deficit present.     Mental Status: She is alert and oriented to person, place, and time.  Psychiatric:        Mood and Affect: Mood normal.        Behavior: Behavior normal.        Thought Content: Thought content normal.        Judgment: Judgment normal.     Results for orders placed or performed in visit on 02/26/24  POCT rapid strep A  Result Value Ref Range   Rapid Strep A Screen Negative Negative  POC COVID-19 BinaxNow  Result Value Ref Range   SARS Coronavirus 2 Ag Negative Negative        Assessment & Plan:   Problem List Items Addressed This Visit   None Visit Diagnoses  Sore throat    -  Primary   POC covid and strep negative. Laryngitis noted. Encourage voice rest, hot tea, fluids. Work note given.   Relevant Orders   POCT rapid strep A (Completed)   POC COVID-19 BinaxNow (Completed)     Acute otitis media, unspecified otitis media type       Right otitis media. Start zpak 2 tablets today, then 1 tablet daily for 4 days.   Relevant Medications   azithromycin  (ZITHROMAX ) 250 MG tablet       Meds ordered this encounter  Medications   azithromycin  (ZITHROMAX ) 250 MG tablet    Sig: Take 2 tablets on day 1, then 1 tablet daily on days 2 through 5    Dispense:  6 tablet    Refill:  0    Return if symptoms worsen or fail to improve.  Tinnie DELENA Harada, NP

## 2024-02-26 NOTE — Patient Instructions (Signed)
 It was great to see you!  Start azithromycin  2 tablets today, then 1 tablet daily until gone  Keep drinking plenty of fluids and hot tea  Let's follow-up with any concerns  Take care,  Tinnie Harada, NP

## 2024-03-25 ENCOUNTER — Encounter: Payer: Self-pay | Admitting: Nurse Practitioner

## 2024-03-25 ENCOUNTER — Ambulatory Visit: Admitting: Nurse Practitioner

## 2024-03-25 VITALS — BP 128/88 | HR 82 | Temp 97.3°F | Ht 67.0 in | Wt 286.0 lb

## 2024-03-25 DIAGNOSIS — I5022 Chronic systolic (congestive) heart failure: Secondary | ICD-10-CM

## 2024-03-25 MED ORDER — CONTRAVE 8-90 MG PO TB12: ORAL_TABLET | ORAL | 0 refills | Status: AC

## 2024-03-25 NOTE — Assessment & Plan Note (Signed)
 Chronic, stable. She is cautious with fluid intake to prevent symptom exacerbation. Limit fluid intake to no more than 8 cups daily. Monitor for peripheral edema and adjust fluid intake as needed.

## 2024-03-25 NOTE — Assessment & Plan Note (Signed)
 BMI 44.7.  Recent weight gain after stopping Wegovy . Exploring alternative medications due to financial and insurance issues. Prescribed Contrave  with a titration schedule: one pill daily for 7 days, then one pill twice daily for 7 days, followed by two pills in the morning and one in the afternoon for 7 days, then two pills twice daily. Discussed potential side effects. Advised to monitor new insurance coverage starting October 1st for weight loss medication coverage. Consider resuming Wegovy  if covered by new insurance. Follow up in 6-8 weeks to assess Contrave  effectiveness.

## 2024-03-25 NOTE — Patient Instructions (Signed)
 It was great to see you!  Drink 6-8 cups of water daily   Start contrave : Start 1 tablet every morning for 7 days, then 1 tablet twice daily for 7 days, then 2 tablets every morning and one every evening for 7 days, then 2 tablets twice daily  I have sent this to [facility] pharmacy, they will call, email, or text to get this to you.   Let's follow-up in 6-8 weeks, sooner if you have concerns.  If a referral was placed today, you will be contacted for an appointment. Please note that routine referrals can sometimes take up to 3-4 weeks to process. Please call our office if you haven't heard anything after this time frame.  Take care,  Tinnie Harada, NP

## 2024-03-25 NOTE — Progress Notes (Signed)
 Established Patient Office Visit  Subjective   Patient ID: Michelle Kane, female    DOB: [birth year]  Age: 38 y.o. MRN: [id number]  Chief Complaint  Patient presents with   Weight Management    Follow up, off of medications for 2 months    HPI Discussed the use of AI scribe software for clinical note transcription with the patient, who gave verbal consent to proceed.  History of Present Illness   Michelle Kane is a 38 year old female who presents with concerns about weight gain and medication coverage.  She initially lost weight with Wegovy , but has since regained it. She has been off the medication for 2 months. Her diet consists of water, protein shakes, protein bars, and small portion meals, though her appetite sometimes affects her ability to eat dinner. She takes probiotics, a metabolism booster, and vitamins. She is concerned about the cost of weight management medications as her insurance will no longer cover them after October 1st.  She has congestive heart failure and limits her fluid intake to eight bottles of water daily to avoid fluid overload and swelling in her feet. She has been off diuretics for six to seven years and experiences occasional swelling in her feet.       ROS See pertinent positives and negatives per HPI.    Objective:     BP 128/88 (BP Location: Left Arm, Patient Position: Sitting, Cuff Size: Large)   Pulse 82   Temp (!) 97.3 F (36.3 C)   Ht 5' 7 (1.702 m)   Wt 286 lb (129.7 kg)   LMP 12/09/2016   SpO2 97%   BMI 44.79 kg/m  BP Readings from Last 3 Encounters:  03/25/24 128/88  02/26/24 132/88  12/24/23 130/80   Wt Readings from Last 3 Encounters:  03/25/24 286 lb (129.7 kg)  02/26/24 284 lb 3.2 oz (128.9 kg)  12/24/23 280 lb 12.8 oz (127.4 kg)      Physical Exam Vitals and nursing note reviewed.  Constitutional:      General: She is not in acute distress.    Appearance: Normal appearance.  HENT:     Head: Normocephalic.   Eyes:     Conjunctiva/sclera: Conjunctivae normal.  Cardiovascular:     Rate and Rhythm: Normal rate and regular rhythm.     Pulses: Normal pulses.     Heart sounds: Normal heart sounds.  Pulmonary:     Effort: Pulmonary effort is normal.     Breath sounds: Normal breath sounds.  Musculoskeletal:     Cervical back: Normal range of motion.     Right lower leg: No edema.     Left lower leg: No edema.  Skin:    General: Skin is warm.  Neurological:     General: No focal deficit present.     Mental Status: She is alert and oriented to person, place, and time.  Psychiatric:        Mood and Affect: Mood normal.        Behavior: Behavior normal.        Thought Content: Thought content normal.        Judgment: Judgment normal.      Assessment & Plan:   Problem List Items Addressed This Visit       Cardiovascular and Mediastinum   CHF (congestive heart failure), NYHA class I, chronic, systolic (HCC) - Primary   Chronic, stable. She is cautious with fluid intake to prevent symptom exacerbation. Limit fluid  intake to no more than 8 cups daily. Monitor for peripheral edema and adjust fluid intake as needed.         Other   Morbid obesity (HCC)   BMI 44.7.  Recent weight gain after stopping Wegovy . Exploring alternative medications due to financial and insurance issues. Prescribed Contrave  with a titration schedule: one pill daily for 7 days, then one pill twice daily for 7 days, followed by two pills in the morning and one in the afternoon for 7 days, then two pills twice daily. Discussed potential side effects. Advised to monitor new insurance coverage starting October 1st for weight loss medication coverage. Consider resuming Wegovy  if covered by new insurance. Follow up in 6-8 weeks to assess Contrave  effectiveness.      Relevant Medications   Naltrexone-buPROPion HCl ER (CONTRAVE ) 8-90 MG TB12    Return in about 8 weeks (around 05/20/2024) for 6-8 weeks weight management .     Tinnie DELENA Harada, NP

## 2024-04-13 ENCOUNTER — Encounter: Payer: Self-pay | Admitting: Nurse Practitioner

## 2024-05-04 ENCOUNTER — Encounter: Payer: Self-pay | Admitting: Nurse Practitioner

## 2024-05-20 ENCOUNTER — Encounter: Payer: Self-pay | Admitting: Nurse Practitioner

## 2024-05-20 ENCOUNTER — Ambulatory Visit: Admitting: Nurse Practitioner

## 2024-05-20 VITALS — BP 126/84 | HR 92 | Temp 97.5°F | Ht 67.0 in | Wt 285.0 lb

## 2024-05-20 DIAGNOSIS — J302 Other seasonal allergic rhinitis: Secondary | ICD-10-CM

## 2024-05-20 DIAGNOSIS — H1010 Acute atopic conjunctivitis, unspecified eye: Secondary | ICD-10-CM | POA: Diagnosis not present

## 2024-05-20 DIAGNOSIS — R0683 Snoring: Secondary | ICD-10-CM | POA: Diagnosis not present

## 2024-05-20 DIAGNOSIS — M25572 Pain in left ankle and joints of left foot: Secondary | ICD-10-CM

## 2024-05-20 DIAGNOSIS — G8929 Other chronic pain: Secondary | ICD-10-CM | POA: Diagnosis not present

## 2024-05-20 DIAGNOSIS — J3089 Other allergic rhinitis: Secondary | ICD-10-CM

## 2024-05-20 NOTE — Assessment & Plan Note (Signed)
 She experienced a recent exacerbation of allergic rhinitis, with green sputum, chest tightness, and ear aches, which improved with over-the-counter cold medication. Symptoms are likely related to postnasal drip and weather changes. Continue montelukast  and alternate Allegra and Zyrtec twice daily. Increase fluid intake and monitor symptoms, reporting any worsening.

## 2024-05-20 NOTE — Progress Notes (Signed)
 Established Patient Office Visit  Subjective   Patient ID: Michelle Kane, female    DOB: 07/09/1986  Age: 38 y.o. MRN: [id number]  Chief Complaint  Patient presents with    Weight Managment    Follow up, had migraines when went up on dosage-stopped 05/04/24, concerns with spitting up green sputum    HPI Discussed the use of AI scribe software for clinical note transcription with the patient, who gave verbal consent to proceed.  History of Present Illness   Michelle Kane is a 38 year old female who presents with adverse reactions to medication and concerns about weight management post-hysterectomy.  She experiences severe headaches, photophobia, and nausea after increasing the dose of Contrave  to two pills in the morning and two in the evening. These symptoms persisted for two weeks, leading her to discontinue the medication.  She underwent a hysterectomy seven years ago and feels it has negatively impacted her metabolism and ability to lose weight. Despite maintaining a balanced diet and drinking six to eight bottles of water daily, she has not observed any weight loss.  Her sleep is inadequate, often waking up tired despite going to bed early and sleeping for approximately ten hours. She has not undergone a sleep study but has a family history of sleep apnea and reports snoring. She recalls using a device that indicated her oxygen levels dropped into the eighties at night.  She has been experiencing respiratory symptoms, including productive cough with green sputum, chest tightness, and dyspnea, which began about a week and a half ago. Tylenol  Cold and sinus medication have improved her symptoms, but she continues to experience bilateral earaches without fever.  She takes montelukast  and alternates between Allegra and Zyrtec for allergies, which she takes twice daily. She manages a past ankle injury with intermittent use of prescription anti-inflammatory medication and wearing braces  as needed.       ROS See pertinent positives and negatives per HPI.    Objective:     BP 126/84 (BP Location: Left Arm, Patient Position: Sitting, Cuff Size: Large)   Pulse 92   Temp (!) 97.5 F (36.4 C)   Ht 5' 7 (1.702 m)   Wt 285 lb (129.3 kg)   LMP 12/09/2016   SpO2 97%   BMI 44.64 kg/m  BP Readings from Last 3 Encounters:  05/20/24 126/84  03/25/24 128/88  02/26/24 132/88   Wt Readings from Last 3 Encounters:  05/20/24 285 lb (129.3 kg)  03/25/24 286 lb (129.7 kg)  02/26/24 284 lb 3.2 oz (128.9 kg)    Fibrosis 4 Score = .44 (Low risk)         Interpretation for patients with HCV          <1.45       -  F0-F1 (Low risk)          1.45-3.25 -  Indeterminate           >3.25      -  F3-F4 (High risk)     Validated for ages 68-65   Physical Exam Vitals and nursing note reviewed.  Constitutional:      General: She is not in acute distress.    Appearance: Normal appearance.  HENT:     Head: Normocephalic.     Right Ear: Tympanic membrane, ear canal and external ear normal.     Left Ear: Tympanic membrane, ear canal and external ear normal.     Mouth/Throat:  Mouth: Mucous membranes are moist.     Pharynx: No posterior oropharyngeal erythema.  Eyes:     Conjunctiva/sclera: Conjunctivae normal.  Cardiovascular:     Rate and Rhythm: Normal rate and regular rhythm.     Pulses: Normal pulses.     Heart sounds: Normal heart sounds.  Pulmonary:     Effort: Pulmonary effort is normal.     Breath sounds: Normal breath sounds.  Musculoskeletal:     Cervical back: Normal range of motion and neck supple. No tenderness.  Lymphadenopathy:     Cervical: No cervical adenopathy.  Skin:    General: Skin is warm.  Neurological:     General: No focal deficit present.     Mental Status: She is alert and oriented to person, place, and time.  Psychiatric:        Mood and Affect: Mood normal.        Behavior: Behavior normal.        Thought Content: Thought content  normal.        Judgment: Judgment normal.      Assessment & Plan:   Problem List Items Addressed This Visit       Respiratory   Seasonal and perennial allergic rhinoconjunctivitis   She experienced a recent exacerbation of allergic rhinitis, with green sputum, chest tightness, and ear aches, which improved with over-the-counter cold medication. Symptoms are likely related to postnasal drip and weather changes. Continue montelukast  and alternate Allegra and Zyrtec twice daily. Increase fluid intake and monitor symptoms, reporting any worsening.         Other   Morbid obesity (HCC)   She struggles with morbid obesity, finding it difficult to lose weight despite dietary changes and limited physical activity due to a chronic ankle injury. A previous trial of Contrave  caused severe headaches, photophobia, and nausea, leading to its discontinuation without significant weight loss. The potential role of sleep apnea in her metabolism and weight management was discussed. She is referred to sleep medicine for evaluation of suspected sleep apnea.      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 Current management includes intermittent use of anti-inflammatory medications and braces. She will need surgery in the future for this. Continue collaboration and recommendations from specialist.       Other Visit Diagnoses       Snoring    -  Primary   She experiences snoring, daytime fatigue. Will place referral to sleep medicine for sleep study.   Relevant Orders   Ambulatory referral to Sleep Studies      Return if symptoms worsen or fail to improve, for after sleep study.    Michelle DELENA Harada, NP

## 2024-05-20 NOTE — Assessment & Plan Note (Signed)
 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 Current management includes intermittent use of anti-inflammatory medications and braces. She will need surgery in the future for this. Continue collaboration and recommendations from specialist.

## 2024-05-20 NOTE — Assessment & Plan Note (Signed)
 She struggles with morbid obesity, finding it difficult to lose weight despite dietary changes and limited physical activity due to a chronic ankle injury. A previous trial of Contrave  caused severe headaches, photophobia, and nausea, leading to its discontinuation without significant weight loss. The potential role of sleep apnea in her metabolism and weight management was discussed. She is referred to sleep medicine for evaluation of suspected sleep apnea.

## 2024-05-20 NOTE — Patient Instructions (Addendum)
 It was great to see you!  I have placed a referral to sleep medicine for sleep study   Keep drinking plenty of fluids   Let's follow-up after your sleep study.  If a referral was placed today, you will be contacted for an appointment. Please note that routine referrals can sometimes take up to 3-4 weeks to process. Please call our office if you haven't heard anything after this time frame.  Take care,  Tinnie Harada, NP

## 2024-07-09 ENCOUNTER — Ambulatory Visit: Payer: Self-pay

## 2024-07-09 NOTE — Telephone Encounter (Signed)
 FYI Only or Action Required?: FYI only for provider: UC advised.  Patient was last seen in primary care on 05/20/2024 by Nedra Tinnie LABOR, NP.  Called Nurse Triage reporting Sore Throat and Shortness of Breath.  Symptoms began a week ago.  Interventions attempted: OTC medications: tylenol  cold and flu; nasal spray.  Symptoms are: gradually worsening.  Triage Disposition: See HCP Within 4 Hours (Or PCP Triage)  Patient/caregiver understands and will follow disposition?: Yes    Copied from CRM #[id number]. Topic: Clinical - Red Word Triage >> Jul 09, 2024 11:51 AM Michelle Kane wrote: [facility] that prompted transfer to Nurse Triage: Congestion, cough, sore throat, chest tightness, shortness of breathe, night sweats, sense of taste is gone.    Reason for Disposition  [1] MILD difficulty breathing (e.g., minimal/no SOB at rest, SOB with walking, pulse < 100) AND [2] NEW-onset or WORSE than normal  Answer Assessment - Initial Assessment Questions Pt called in with symptoms x 1 week that have continued to worsen : congestion, cough with SOB and chest tightening, sore throat, fever, loss of sense of taste. Pt reports at home COVID test was negative last week; pt reports her daughter was sick last week but her symptoms have resolved at this time. Pt states that symptoms have been present x1 week, she started to feel better but symptoms have worsened at this time. Pt reports fever ranging from 99.5-100F and taking OTC tylenol  cold and flu DM and nasal spray. Pt reports using rescue inhaler more consistently for coughing spells which causes heart racing and chest tightness. Pt denies any current distress of chest pain. Dicussed appt availability and recommended UC. Pt agreeable to disposition.     1. RESPIRATORY STATUS: Describe your breathing? (e.g., wheezing, shortness of breath, unable to speak, severe coughing)      SOB; severe coughing  2. ONSET: When did this breathing problem begin?       X 1 week   3. PATTERN Does the difficult breathing come and go, or has it been constant since it started?      Comes and  goes, worse with coughing   4. SEVERITY: How bad is your breathing? (e.g., mild, moderate, severe)      Moderate   5. RECURRENT SYMPTOM: Have you had difficulty breathing before? If Yes, ask: When was the last time? and What happened that time?      Yes; hx of allergy  induced asthma, pt does have inhaler   7. LUNG HISTORY: Do you have any history of lung disease?  (e.g., pulmonary embolus, asthma, emphysema)     Asthma   8. CAUSE: What do you think is causing the breathing problem?      Respiratory infection   9. OTHER SYMPTOMS: Do you have any other symptoms? (e.g., chest pain, cough, dizziness, fever, runny nose)     Cough, fever, sore throat, congestion, night sweats; sense of taste gone  Protocols used: Breathing Difficulty-A-AH

## 2024-07-09 NOTE — Telephone Encounter (Signed)
 Noted. Urgent Care advised by nurse triage.

## 2024-07-12 ENCOUNTER — Institutional Professional Consult (permissible substitution): Admitting: Neurology

## 2024-07-14 ENCOUNTER — Institutional Professional Consult (permissible substitution): Admitting: Neurology

## 2024-07-19 ENCOUNTER — Institutional Professional Consult (permissible substitution): Admitting: Neurology

## 2024-07-19 ENCOUNTER — Encounter: Payer: Self-pay | Admitting: Neurology

## 2024-07-19 VITALS — BP 118/82 | HR 89 | Ht 67.0 in | Wt 291.0 lb

## 2024-07-19 DIAGNOSIS — R519 Headache, unspecified: Secondary | ICD-10-CM | POA: Diagnosis not present

## 2024-07-19 DIAGNOSIS — G4719 Other hypersomnia: Secondary | ICD-10-CM

## 2024-07-19 DIAGNOSIS — Z820 Family history of epilepsy and other diseases of the nervous system: Secondary | ICD-10-CM

## 2024-07-19 DIAGNOSIS — G4700 Insomnia, unspecified: Secondary | ICD-10-CM | POA: Diagnosis not present

## 2024-07-19 DIAGNOSIS — Z6841 Body Mass Index (BMI) 40.0 and over, adult: Secondary | ICD-10-CM | POA: Diagnosis not present

## 2024-07-19 DIAGNOSIS — R0683 Snoring: Secondary | ICD-10-CM | POA: Diagnosis not present

## 2024-07-19 DIAGNOSIS — Z9189 Other specified personal risk factors, not elsewhere classified: Secondary | ICD-10-CM | POA: Diagnosis not present

## 2024-07-19 NOTE — Patient Instructions (Signed)

## 2024-07-19 NOTE — Progress Notes (Signed)
 Subjective:    Patient ID: Michelle Kane is a 39 y.o. female.  HPI    True Mar, MD, PhD [facility] [address], [address] [address] [place], [place] [address]  Dear Tinnie,  I saw your patient, Michelle Kane, upon your kind request in my sleep clinic today for initial consultation of her sleep disorder, in particular, concern for underlying obstructive sleep apnea.  The patient is unaccompanied today.  As you know, Michelle Kane is a 39 year old female with an underlying medical history of left ankle pain, allergic rhinitis, allergy  induced asthma, chronic systolic congestive heart failure, reflux disease, sinus tachycardia, cervical cancer, headache, and severe obesity with a BMI of over 40, who reports snoring and excessive daytime somnolence.  Her Epworth sleepiness score is 9 out of 24, fatigue severity score is 48 out of 63.  I reviewed your office note from 05/20/2024.  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  She lives with her 37 year old daughter and 51-year-old son who has autism.  He sleeps in the same room with her and sometimes on the king-size bed with her.  She also has a 54 year old son who lives on his own with his family.   Bedtime is generally around 7:30 PM or 8 and she puts a TV on a sleep timer and a sleep app for noise.  The screen is typically dark.  Rise time is around 6 or 6:30 AM.  She drinks caffeine in the form of diet and energy drinks, usually 1/day or 1 cup of coffee per day.  She quit smoking some 5 years ago.  She does not drink any alcohol.  She is working on weight loss.  Her father has sleep apnea. She has chronic difficulty initiating and maintaining sleep and PM type medications have not helped and Melatonin did not help either. She has occasional morning headaches. She denies nightly nocturia.    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 EF 40-45% in 2016, varying by several echoes. F/u echo 06/2015 showed EF 50%.    PONV (postoperative nausea and vomiting)    Sinus tachycardia    a. 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 b. 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    Her Past Surgical History Is Significant For: Past Surgical History:  Procedure Laterality Date   ABDOMINAL HYSTERECTOMY     CESAREAN SECTION  03-01-2008  and 2016   IM PINNING RIGHT ELBOW FX  1992   HARDWARE REMOVED   LAPAROSCOPIC ASSISTED VAGINAL HYSTERECTOMY Left 02/04/2017   Procedure: LAPAROSCOPIC ASSISTED VAGINAL HYSTERECTOMY, LEFT SALPINGOOPHORECTOMY;  Surgeon: Mat Browning, MD;  Location: [facility];  Service: Gynecology;  Laterality: Left;  NEED BED   LAPAROSCOPY N/A 07/29/2014   Procedure: LAPAROSCOPY DIAGNOSTIC;  Surgeon: Browning LITTIE Mat, MD;  Location: [facility];  Service: Gynecology;  Laterality: N/A;   OOPHORECTOMY Left 02/04/2017   Procedure: LEFT OOPHORECTOMY;  Surgeon: Mat Browning, MD;  Location: [facility];  Service: Gynecology;  Laterality: Left;    Her Family History Is Significant For: Family History  Problem Relation Age of Onset   Asthma Mother    Crohn's disease Mother    Colon polyps Mother    Allergic rhinitis Mother    Heart disease Father        heart failure & Amyloidosis   Hyperlipidemia Father    Hemophilia Father    COPD Father        smoked   Urticaria Father    Sleep apnea Father    Allergic  rhinitis Sister    Heart failure Brother    Heart failure Paternal Aunt    Heart failure Paternal Uncle    COPD Maternal Grandmother        smoked   Lung cancer Maternal Grandmother        smoked   Cancer Maternal Grandfather        skin   Heart attack Maternal Grandfather    Diabetes Paternal Grandfather    Stroke Paternal Grandfather    Allergic rhinitis Daughter    Hemophilia Son    Allergic rhinitis Son    Food Allergy  Son    Asthma Niece    Hypertension Neg Hx    Eczema Neg Hx    Angioedema Neg Hx    Atopy Neg Hx    Immunodeficiency Neg Hx     Her Social History Is Significant For: Social History   Socioeconomic History   Marital status: Married    Spouse name: Not on file   Number of children: Not on file   Years of education: Not on file   Highest education level: Not on file  Occupational History   Occupation: CNA   Tobacco Use   Smoking status: Former    Current packs/day: 0.50    Average packs/day: 0.5 packs/day for 10.0 years (5.0 ttl pk-yrs)    Types: Cigarettes   Smokeless tobacco: Never  Vaping Use   Vaping status: Former  Substance and Sexual Activity   Alcohol use: Not Currently   Drug use: No   Sexual activity: Yes    Birth control/protection: Surgical  Other Topics Concern   Not on file  Social History Narrative   Pt lives with family    Pt works    Social Drivers of Health   Tobacco Use: Medium Risk (07/19/2024)   Patient History    Smoking Tobacco Use: Former    Smokeless Tobacco Use: Never    Passive Exposure: Not on Actuary Strain: Not on file  Food Insecurity: Not on file  Transportation Needs: Not on file  Physical Activity: Not on file  Stress: Not on file  Social Connections: Not on file  Depression (PHQ2-9): Medium Risk (09/22/2023)   Depression (PHQ2-9)    PHQ-2 Score: 8  Alcohol Screen: Not on file  Housing: Not on file  Utilities: Not on file  Health Literacy: Not on file    Her Allergies Are:   Allergies[1]:   Her Current Medications Are:  Outpatient Encounter Medications as of 07/19/2024  Medication Sig   albuterol  (VENTOLIN  HFA) 108 (90 Base) MCG/ACT inhaler PLEASE SEE ATTACHED FOR DETAILED DIRECTIONS   EPINEPHrine  0.3 mg/0.3 mL IJ SOAJ injection Inject 0.3 mg into the muscle as needed for anaphylaxis.  famotidine  (PEPCID ) 20 MG tablet Take 1 tablet (20 mg total) by mouth 2 (two) times daily.   fexofenadine (ALLEGRA) 60 MG tablet Take 60 mg by mouth 2 (two) times daily.   fluticasone  furoate-vilanterol (BREO ELLIPTA ) 200-25 MCG/ACT AEPB Inhale 1 puff into the lungs daily. Rinse mouth after each use.   LORazepam (ATIVAN) 1 MG tablet Take 1 mg by mouth every 6 (six) hours as needed for anxiety.   meloxicam (MOBIC) 15 MG tablet Take 15 mg by mouth daily.   montelukast  (SINGULAIR ) 10 MG tablet Take 1 tablet (10 mg total) by mouth at bedtime.   pantoprazole  (PROTONIX ) 40 MG tablet Take 1 tablet (40 mg total) by mouth daily. Take 30-60 min before first meal of the day   cyclobenzaprine  (FLEXERIL ) 10 MG tablet Take 10 mg by mouth every 8 (eight) hours as needed. (Patient not taking: Reported on 05/20/2024)   levocetirizine (XYZAL ) 5 MG tablet Take 5 mg by mouth in the morning and at bedtime. (Patient not taking: Reported on 05/20/2024)   Naltrexone-buPROPion HCl ER (CONTRAVE ) 8-90 MG TB12 Start 1 tablet every morning for 7 days, then 1 tablet twice daily for 7 days, then 2 tablets every morning and one every evening for 7 days, then 2 tablets twice daily (Patient not taking: Reported on 05/20/2024)   No facility-administered encounter medications on file as of 07/19/2024.  :   Review of Systems:  Out of a complete 14 point review of systems, all are reviewed and negative with the exception of these symptoms as listed below:   Review of Systems  Objective:  Neurological Exam  Physical Exam Physical Examination:   Vitals:   07/19/24 1415  BP: 118/82  Pulse: 89    General  Examination: The patient is a very pleasant 39 y.o. female in no acute distress. She appears well-developed and well-nourished and well groomed.   HEENT: Normocephalic, atraumatic, pupils are equal, round and reactive to light, extraocular tracking is good without limitation to gaze excursion or nystagmus noted. No photophobia.  No corrective eye glasses in place. Hearing is grossly intact.  Face is symmetric with normal facial animation. Speech is clear without dysarthria. There is no hypophonia. There is no lip, neck/head, jaw or voice tremor. Neck is supple with full range of passive and active motion. There are no carotid bruits on auscultation.  Airway/Oropharynx exam reveals: mild mouth dryness, good dental hygiene and mild airway crowding, due to small airway entry, Mallampati class II, tonsillar size 1-2+ on the left, 2+ on the right.  Neck circumference 17-5/8 inches, mild overbite noted.  Tongue protrudes centrally and palate elevates symmetrically.  Chest: Clear to auscultation without wheezing, rhonchi or crackles noted.  Heart: S1+S2+0, regular and normal without murmurs, rubs or gallops noted.   Abdomen: Soft, non-tender and non-distended.  Extremities: There is no pitting edema in the distal lower extremities bilaterally.   Skin: Warm and dry without trophic changes noted.   Musculoskeletal: exam reveals no obvious joint deformities but she reports issues with low back and needing back surgery as well as issues with her left Achilles tendon, needing surgery eventually.   Neurologically:  Mental status: The patient is awake, alert and oriented in all 4 spheres. Her immediate and remote memory, attention, language skills and fund of knowledge are appropriate. There is no evidence of aphasia, agnosia, apraxia or anomia. Speech is clear with normal prosody and enunciation. Thought process is linear. Mood is normal and affect is normal.  Cranial nerves  II - XII are as described above  under HEENT exam.  Motor exam: Normal bulk, moving all 4 extremities without obvious restriction, no obvious action or resting tremor.  Fine motor skills and coordination: Intact grossly.  Cerebellar testing: No dysmetria or intention tremor. There is no truncal or gait ataxia.  Sensory exam: intact to light touch in the upper and lower extremities.  Gait, station and balance: She stands easily. No veering to one side is noted. No leaning to one side is noted. Posture is age-appropriate and stance is narrow based. Gait shows normal stride length and normal pace. No problems turning are noted.   Assessment and Plan:  In summary, Michelle Kane is a very pleasant 40 year old female with an underlying medical history of left ankle pain, allergic rhinitis, allergy  induced asthma, chronic systolic congestive heart failure, reflux disease, sinus tachycardia, cervical cancer, headache, and severe obesity with a BMI of over 40, whose history and physical exam are concerning for sleep disordered breathing, particularly obstructive sleep apnea (OSA). A laboratory attended sleep study is typically considered gold standard for evaluation of sleep disordered breathing.   I had a long chat with the patient about my findings and the diagnosis of sleep apnea, particularly OSA, its prognosis and treatment options. We talked about medical/conservative treatments, surgical interventions and non-pharmacological approaches for symptom control. I explained, in particular, the risks and ramifications of untreated moderate to severe OSA, especially with respect to developing cardiovascular disease down the road, including congestive heart failure (CHF), difficult to treat hypertension, cardiac arrhythmias (particularly A-fib), neurovascular complications including TIA, stroke and dementia. Even type 2 diabetes has, in part, been linked to untreated OSA. Symptoms of untreated OSA may include (but may not be limited to) daytime  sleepiness, nocturia (i.e. frequent nighttime urination), memory problems, mood irritability and suboptimally controlled or worsening mood disorder such as depression and/or anxiety, lack of energy, lack of motivation, physical discomfort, as well as recurrent headaches, especially morning or nocturnal headaches. We talked about the importance of maintaining a healthy lifestyle and striving for healthy weight. In addition, we talked about the importance of striving for and maintaining good sleep hygiene. I recommended a sleep study at this time. I outlined the differences between a laboratory attended sleep study which is considered more comprehensive and accurate over the option of a home sleep test (HST); the latter may lead to underestimation of sleep disordered breathing in some instances and does not help with diagnosing upper airway resistance syndrome and is not accurate enough to diagnose primary central sleep apnea typically. I outlined possible surgical and non-surgical treatment options of OSA, including the use of a positive airway pressure (PAP) device (i.e. CPAP, AutoPAP/APAP or BiPAP in certain circumstances), a custom-made dental device (aka oral appliance, which would require a referral to a specialist dentist or orthodontist typically, and is generally speaking not considered for patients with full dentures or edentulous state), upper airway surgical options, such as traditional UPPP (which is not considered a first-line treatment) or the Inspire device (hypoglossal nerve stimulator, which would involve a referral for consultation with an ENT surgeon, after careful selection, following inclusion criteria - also not first-line treatment). I explained the PAP treatment option to the patient in detail, as this is generally considered first-line treatment.  The patient indicated that she would be willing to try PAP therapy, if the need arises. I explained the importance of being compliant with PAP  treatment, not only for insurance purposes but primarily to improve  patient's symptoms symptoms, and for the patient's long term health benefit, including to reduce Her cardiovascular risks longer-term.    We will pick up our discussion about the next steps and treatment options after testing.  We will keep her posted as to the test results by phone call and/or MyChart messaging where possible.  We will plan to follow-up in sleep clinic accordingly as well.  I answered all her questions today and the patient was in agreement.   I encouraged her to call with any interim questions, concerns, problems or updates or email us  through MyChart.  Generally speaking, sleep test authorizations may take up to 2 weeks, sometimes less, sometimes longer, the patient is encouraged to get in touch with us  if they do not hear back from the sleep lab staff directly within the next 2 weeks.  Thank you very much for allowing me to participate in the care of this nice patient. If I can be of any further assistance to you please do not hesitate to call me at [phone].  Sincerely,   True Mar, MD, PhD     [1]  Allergies Allergen Reactions   Buspar [Buspirone] Other (See Comments)    Mouth, lips tingling, throat tightness, chest pain   Vicodin [Hydrocodone -Acetaminophen ] Itching, Nausea And Vomiting and Other (See Comments)    GI pains also   Labetalol      Causes congestive heart failure   Zyrtec [Cetirizine]     Worsening anxiety

## 2024-07-23 ENCOUNTER — Telehealth: Payer: Self-pay | Admitting: Neurology

## 2024-07-23 NOTE — Telephone Encounter (Signed)
 NPSG MCD healthy blue pending.

## 2024-07-26 NOTE — Telephone Encounter (Signed)
 HST MCD Healthy blue pending NPSG denied see below for the denial.

## 2024-07-27 NOTE — Telephone Encounter (Signed)
 HST MCD Healthy blue no auth req via fax
